# Patient Record
Sex: Female | Born: 1955 | Race: White | Hispanic: No | Marital: Married | Smoking: Never smoker
Health system: Southern US, Community
[De-identification: ages and names within clinical notes are randomized; demographics above are authoritative.]

## PROBLEM LIST (undated history)

## (undated) DIAGNOSIS — F32A Depression, unspecified: Secondary | ICD-10-CM

## (undated) DIAGNOSIS — N189 Chronic kidney disease, unspecified: Secondary | ICD-10-CM

## (undated) DIAGNOSIS — J4520 Mild intermittent asthma, uncomplicated: Secondary | ICD-10-CM

## (undated) DIAGNOSIS — R011 Cardiac murmur, unspecified: Secondary | ICD-10-CM

## (undated) DIAGNOSIS — N289 Disorder of kidney and ureter, unspecified: Secondary | ICD-10-CM

## (undated) DIAGNOSIS — I10 Essential (primary) hypertension: Secondary | ICD-10-CM

## (undated) DIAGNOSIS — F411 Generalized anxiety disorder: Secondary | ICD-10-CM

## (undated) DIAGNOSIS — E119 Type 2 diabetes mellitus without complications: Secondary | ICD-10-CM

## (undated) DIAGNOSIS — J45909 Unspecified asthma, uncomplicated: Secondary | ICD-10-CM

## (undated) DIAGNOSIS — K219 Gastro-esophageal reflux disease without esophagitis: Secondary | ICD-10-CM

## (undated) DIAGNOSIS — M1990 Unspecified osteoarthritis, unspecified site: Secondary | ICD-10-CM

## (undated) DIAGNOSIS — K449 Diaphragmatic hernia without obstruction or gangrene: Secondary | ICD-10-CM

## (undated) DIAGNOSIS — E782 Mixed hyperlipidemia: Secondary | ICD-10-CM

## (undated) DIAGNOSIS — E7800 Pure hypercholesterolemia, unspecified: Secondary | ICD-10-CM

## (undated) DIAGNOSIS — T8859XA Other complications of anesthesia, initial encounter: Secondary | ICD-10-CM

## (undated) DIAGNOSIS — Z973 Presence of spectacles and contact lenses: Secondary | ICD-10-CM

## (undated) DIAGNOSIS — G4733 Obstructive sleep apnea (adult) (pediatric): Secondary | ICD-10-CM

## (undated) DIAGNOSIS — Z8719 Personal history of other diseases of the digestive system: Secondary | ICD-10-CM

## (undated) DIAGNOSIS — D649 Anemia, unspecified: Secondary | ICD-10-CM

## (undated) DIAGNOSIS — Z8679 Personal history of other diseases of the circulatory system: Secondary | ICD-10-CM

## (undated) DIAGNOSIS — F329 Major depressive disorder, single episode, unspecified: Secondary | ICD-10-CM

## (undated) DIAGNOSIS — Z87442 Personal history of urinary calculi: Secondary | ICD-10-CM

## (undated) DIAGNOSIS — J309 Allergic rhinitis, unspecified: Secondary | ICD-10-CM

## (undated) DIAGNOSIS — T4145XA Adverse effect of unspecified anesthetic, initial encounter: Secondary | ICD-10-CM

## (undated) DIAGNOSIS — F419 Anxiety disorder, unspecified: Secondary | ICD-10-CM

## (undated) DIAGNOSIS — D509 Iron deficiency anemia, unspecified: Secondary | ICD-10-CM

## (undated) DIAGNOSIS — N200 Calculus of kidney: Secondary | ICD-10-CM

## (undated) DIAGNOSIS — G4730 Sleep apnea, unspecified: Secondary | ICD-10-CM

## (undated) DIAGNOSIS — T7840XA Allergy, unspecified, initial encounter: Secondary | ICD-10-CM

## (undated) DIAGNOSIS — H269 Unspecified cataract: Secondary | ICD-10-CM

## (undated) HISTORY — PX: EYE SURGERY: SHX253

## (undated) HISTORY — PX: OTHER SURGICAL HISTORY: SHX169

## (undated) HISTORY — DX: Calculus of kidney: N20.0

## (undated) HISTORY — DX: Allergy, unspecified, initial encounter: T78.40XA

## (undated) HISTORY — PX: ABDOMINAL EXPLORATION SURGERY: SHX538

## (undated) HISTORY — DX: Chronic kidney disease, unspecified: N18.9

## (undated) HISTORY — PX: SCLERAL BUCKLE PROCEDURE: SHX2734

## (undated) HISTORY — PX: BREAST EXCISIONAL BIOPSY: SUR124

## (undated) HISTORY — PX: WISDOM TOOTH EXTRACTION: SHX21

## (undated) HISTORY — PX: DIAGNOSTIC LAPAROSCOPY: SUR761

## (undated) HISTORY — PX: APPENDECTOMY: SHX54

## (undated) HISTORY — PX: DILATION AND CURETTAGE OF UTERUS: SHX78

## (undated) HISTORY — PX: LIPOMA EXCISION: SHX5283

## (undated) HISTORY — DX: Unspecified cataract: H26.9

## (undated) HISTORY — PX: RETINAL DETACHMENT SURGERY: SHX105

## (undated) SURGERY — Surgical Case
Anesthesia: *Unknown

---

## 1997-07-01 ENCOUNTER — Ambulatory Visit (HOSPITAL_COMMUNITY): Admission: RE | Admit: 1997-07-01 | Discharge: 1997-07-01 | Payer: Self-pay | Admitting: Obstetrics and Gynecology

## 1998-06-26 ENCOUNTER — Ambulatory Visit (HOSPITAL_COMMUNITY): Admission: RE | Admit: 1998-06-26 | Discharge: 1998-06-26 | Payer: Self-pay

## 1999-06-29 ENCOUNTER — Encounter: Payer: Self-pay | Admitting: Family Medicine

## 1999-06-29 ENCOUNTER — Ambulatory Visit (HOSPITAL_COMMUNITY): Admission: RE | Admit: 1999-06-29 | Discharge: 1999-06-29 | Payer: Self-pay | Admitting: Family Medicine

## 2000-07-29 ENCOUNTER — Ambulatory Visit (HOSPITAL_COMMUNITY): Admission: RE | Admit: 2000-07-29 | Discharge: 2000-07-29 | Payer: Self-pay | Admitting: Family Medicine

## 2000-07-29 ENCOUNTER — Encounter: Payer: Self-pay | Admitting: Family Medicine

## 2001-04-08 HISTORY — PX: LIPOMA EXCISION: SHX5283

## 2001-08-12 ENCOUNTER — Encounter: Payer: Self-pay | Admitting: Family Medicine

## 2001-08-12 ENCOUNTER — Ambulatory Visit (HOSPITAL_COMMUNITY): Admission: RE | Admit: 2001-08-12 | Discharge: 2001-08-12 | Payer: Self-pay | Admitting: Family Medicine

## 2001-08-21 ENCOUNTER — Encounter: Payer: Self-pay | Admitting: Family Medicine

## 2001-08-21 ENCOUNTER — Encounter: Admission: RE | Admit: 2001-08-21 | Discharge: 2001-08-21 | Payer: Self-pay | Admitting: Family Medicine

## 2002-02-23 ENCOUNTER — Encounter: Payer: Self-pay | Admitting: Family Medicine

## 2002-02-23 ENCOUNTER — Encounter: Admission: RE | Admit: 2002-02-23 | Discharge: 2002-02-23 | Payer: Self-pay | Admitting: Family Medicine

## 2002-08-27 ENCOUNTER — Encounter: Admission: RE | Admit: 2002-08-27 | Discharge: 2002-08-27 | Payer: Self-pay | Admitting: Family Medicine

## 2002-08-27 ENCOUNTER — Encounter: Payer: Self-pay | Admitting: Family Medicine

## 2003-09-15 ENCOUNTER — Encounter: Admission: RE | Admit: 2003-09-15 | Discharge: 2003-09-15 | Payer: Self-pay | Admitting: Family Medicine

## 2004-10-11 ENCOUNTER — Encounter: Admission: RE | Admit: 2004-10-11 | Discharge: 2004-10-11 | Payer: Self-pay | Admitting: Family Medicine

## 2005-11-06 ENCOUNTER — Encounter: Admission: RE | Admit: 2005-11-06 | Discharge: 2005-11-06 | Payer: Self-pay | Admitting: Family Medicine

## 2006-04-08 DIAGNOSIS — E119 Type 2 diabetes mellitus without complications: Secondary | ICD-10-CM

## 2006-04-08 HISTORY — PX: LAPAROSCOPIC APPENDECTOMY: SHX408

## 2006-04-08 HISTORY — DX: Type 2 diabetes mellitus without complications: E11.9

## 2006-11-13 ENCOUNTER — Encounter: Admission: RE | Admit: 2006-11-13 | Discharge: 2006-11-13 | Payer: Self-pay | Admitting: Family Medicine

## 2007-04-09 DIAGNOSIS — E119 Type 2 diabetes mellitus without complications: Secondary | ICD-10-CM | POA: Insufficient documentation

## 2008-01-05 ENCOUNTER — Encounter: Admission: RE | Admit: 2008-01-05 | Discharge: 2008-01-05 | Payer: Self-pay | Admitting: Family Medicine

## 2009-01-31 ENCOUNTER — Encounter: Admission: RE | Admit: 2009-01-31 | Discharge: 2009-01-31 | Payer: Self-pay | Admitting: Family Medicine

## 2009-02-07 ENCOUNTER — Encounter: Admission: RE | Admit: 2009-02-07 | Discharge: 2009-02-07 | Payer: Self-pay | Admitting: Family Medicine

## 2010-02-22 ENCOUNTER — Encounter: Admission: RE | Admit: 2010-02-22 | Discharge: 2010-02-22 | Payer: Self-pay | Admitting: Family Medicine

## 2011-02-04 ENCOUNTER — Other Ambulatory Visit: Payer: Self-pay | Admitting: Family Medicine

## 2011-02-04 DIAGNOSIS — Z1231 Encounter for screening mammogram for malignant neoplasm of breast: Secondary | ICD-10-CM

## 2011-03-01 ENCOUNTER — Ambulatory Visit
Admission: RE | Admit: 2011-03-01 | Discharge: 2011-03-01 | Disposition: A | Discharge status: Home / Self Care | Payer: BC Managed Care – PPO | Source: Ambulatory Visit | Attending: Family Medicine | Admitting: Family Medicine

## 2011-03-01 DIAGNOSIS — Z1231 Encounter for screening mammogram for malignant neoplasm of breast: Secondary | ICD-10-CM

## 2011-10-03 ENCOUNTER — Encounter (INDEPENDENT_AMBULATORY_CARE_PROVIDER_SITE_OTHER): Payer: BC Managed Care – PPO | Admitting: Ophthalmology

## 2011-10-03 DIAGNOSIS — H43819 Vitreous degeneration, unspecified eye: Secondary | ICD-10-CM

## 2011-10-03 DIAGNOSIS — H2510 Age-related nuclear cataract, unspecified eye: Secondary | ICD-10-CM

## 2011-10-03 DIAGNOSIS — H33309 Unspecified retinal break, unspecified eye: Secondary | ICD-10-CM

## 2011-10-03 DIAGNOSIS — H4310 Vitreous hemorrhage, unspecified eye: Secondary | ICD-10-CM

## 2011-10-03 DIAGNOSIS — H35419 Lattice degeneration of retina, unspecified eye: Secondary | ICD-10-CM

## 2011-10-07 ENCOUNTER — Encounter (INDEPENDENT_AMBULATORY_CARE_PROVIDER_SITE_OTHER): Payer: BC Managed Care – PPO | Admitting: Ophthalmology

## 2011-10-07 DIAGNOSIS — H33309 Unspecified retinal break, unspecified eye: Secondary | ICD-10-CM

## 2011-10-11 ENCOUNTER — Encounter (HOSPITAL_COMMUNITY): Payer: Self-pay | Admitting: Pharmacy Technician

## 2011-10-14 ENCOUNTER — Encounter (HOSPITAL_COMMUNITY): Payer: Self-pay | Admitting: *Deleted

## 2011-10-15 ENCOUNTER — Ambulatory Visit (HOSPITAL_COMMUNITY): Payer: BC Managed Care – PPO

## 2011-10-15 ENCOUNTER — Encounter (HOSPITAL_COMMUNITY): Payer: Self-pay

## 2011-10-15 ENCOUNTER — Ambulatory Visit (INDEPENDENT_AMBULATORY_CARE_PROVIDER_SITE_OTHER): Payer: BC Managed Care – PPO | Admitting: Ophthalmology

## 2011-10-15 ENCOUNTER — Encounter (HOSPITAL_COMMUNITY): Payer: Self-pay | Admitting: *Deleted

## 2011-10-15 ENCOUNTER — Encounter (HOSPITAL_COMMUNITY)
Admission: RE | Disposition: A | Discharge status: Home / Self Care | Payer: Self-pay | Source: Ambulatory Visit | Attending: Ophthalmology

## 2011-10-15 ENCOUNTER — Ambulatory Visit (HOSPITAL_COMMUNITY)
Admission: RE | Admit: 2011-10-15 | Discharge: 2011-10-16 | Disposition: A | Discharge status: Home / Self Care | Payer: BC Managed Care – PPO | Source: Ambulatory Visit | Attending: Ophthalmology | Admitting: Ophthalmology

## 2011-10-15 DIAGNOSIS — H4310 Vitreous hemorrhage, unspecified eye: Secondary | ICD-10-CM

## 2011-10-15 DIAGNOSIS — H33009 Unspecified retinal detachment with retinal break, unspecified eye: Secondary | ICD-10-CM

## 2011-10-15 DIAGNOSIS — E109 Type 1 diabetes mellitus without complications: Secondary | ICD-10-CM | POA: Insufficient documentation

## 2011-10-15 DIAGNOSIS — G4730 Sleep apnea, unspecified: Secondary | ICD-10-CM | POA: Insufficient documentation

## 2011-10-15 DIAGNOSIS — I10 Essential (primary) hypertension: Secondary | ICD-10-CM | POA: Insufficient documentation

## 2011-10-15 DIAGNOSIS — K219 Gastro-esophageal reflux disease without esophagitis: Secondary | ICD-10-CM | POA: Insufficient documentation

## 2011-10-15 HISTORY — DX: Essential (primary) hypertension: I10

## 2011-10-15 HISTORY — DX: Pure hypercholesterolemia, unspecified: E78.00

## 2011-10-15 HISTORY — PX: SCLERAL BUCKLE: SHX5340

## 2011-10-15 HISTORY — DX: Unspecified osteoarthritis, unspecified site: M19.90

## 2011-10-15 HISTORY — DX: Gastro-esophageal reflux disease without esophagitis: K21.9

## 2011-10-15 HISTORY — DX: Adverse effect of unspecified anesthetic, initial encounter: T41.45XA

## 2011-10-15 HISTORY — DX: Other complications of anesthesia, initial encounter: T88.59XA

## 2011-10-15 HISTORY — DX: Sleep apnea, unspecified: G47.30

## 2011-10-15 HISTORY — DX: Anemia, unspecified: D64.9

## 2011-10-15 LAB — BASIC METABOLIC PANEL
BUN: 18 mg/dL (ref 6–23)
CO2: 24 mEq/L (ref 19–32)
Chloride: 106 mEq/L (ref 96–112)
Creatinine, Ser: 0.87 mg/dL (ref 0.50–1.10)
GFR calc Af Amer: 84 mL/min — ABNORMAL LOW (ref >90)
Glucose, Bld: 110 mg/dL — ABNORMAL HIGH (ref 70–99)
Potassium: 3.9 mEq/L (ref 3.5–5.1)

## 2011-10-15 LAB — CBC
HCT: 35.9 % — ABNORMAL LOW (ref 36.0–46.0)
Hemoglobin: 12.2 g/dL (ref 12.0–15.0)
MCH: 30.4 pg (ref 26.0–34.0)
MCHC: 34 g/dL (ref 30.0–36.0)
MCV: 89.5 fL (ref 78.0–100.0)
RDW: 13 % (ref 11.5–15.5)

## 2011-10-15 LAB — SURGICAL PCR SCREEN: Staphylococcus aureus: NEGATIVE

## 2011-10-15 LAB — GLUCOSE, CAPILLARY
Glucose-Capillary: 161 mg/dL — ABNORMAL HIGH (ref 70–99)
Glucose-Capillary: 205 mg/dL — ABNORMAL HIGH (ref 70–99)

## 2011-10-15 SURGERY — SCLERAL BUCKLE
Anesthesia: General | Site: Eye | Laterality: Right | Wound class: Clean

## 2011-10-15 MED ORDER — BACITRACIN-POLYMYXIN B 500-10000 UNIT/GM OP OINT
1.0000 "application " | TOPICAL_OINTMENT | Freq: Four times a day (QID) | OPHTHALMIC | Status: DC
  Filled 2011-10-15: qty 3.5

## 2011-10-15 MED ORDER — TETRACAINE HCL 0.5 % OP SOLN
2.0000 [drp] | Freq: Once | OPHTHALMIC | Status: DC
  Filled 2011-10-15: qty 2

## 2011-10-15 MED ORDER — CLINDAMYCIN PHOSPHATE 600 MG/50ML IV SOLN
600.0000 mg | INTRAVENOUS | Status: DC
  Filled 2011-10-15: qty 50

## 2011-10-15 MED ORDER — MORPHINE SULFATE 2 MG/ML IJ SOLN: 1.0000 mg | INTRAMUSCULAR | Status: DC | PRN

## 2011-10-15 MED ORDER — BRIMONIDINE TARTRATE 0.2 % OP SOLN
1.0000 [drp] | Freq: Two times a day (BID) | OPHTHALMIC | Status: DC
  Filled 2011-10-15 (×2): qty 5

## 2011-10-15 MED ORDER — CITALOPRAM HYDROBROMIDE 40 MG PO TABS
40.0000 mg | ORAL_TABLET | Freq: Every day | ORAL | Status: DC
  Administered 2011-10-15: 40 mg via ORAL
  Filled 2011-10-15 (×2): qty 1

## 2011-10-15 MED ORDER — TEMAZEPAM 15 MG PO CAPS: 15.0000 mg | ORAL_CAPSULE | Freq: Every evening | ORAL | Status: DC | PRN

## 2011-10-15 MED ORDER — LIRAGLUTIDE 18 MG/3ML ~~LOC~~ SOLN: 1.8000 mg | Freq: Every day | SUBCUTANEOUS | Status: DC

## 2011-10-15 MED ORDER — LATANOPROST 0.005 % OP SOLN
1.0000 [drp] | Freq: Every day | OPHTHALMIC | Status: DC
  Filled 2011-10-15 (×2): qty 2.5

## 2011-10-15 MED ORDER — POLYETHYLENE GLYCOL 3350 17 G PO PACK
17.0000 g | PACK | Freq: Every day | ORAL | Status: DC
  Administered 2011-10-15: 17 g via ORAL
  Filled 2011-10-15 (×2): qty 1

## 2011-10-15 MED ORDER — SODIUM CHLORIDE 0.9 % IV SOLN
INTRAVENOUS | Status: DC | PRN
  Administered 2011-10-15 (×2): via INTRAVENOUS

## 2011-10-15 MED ORDER — ADULT MULTIVITAMIN W/MINERALS CH
1.0000 | ORAL_TABLET | Freq: Every day | ORAL | Status: DC
  Administered 2011-10-15: 1 via ORAL
  Filled 2011-10-15 (×2): qty 1

## 2011-10-15 MED ORDER — GATIFLOXACIN 0.5 % OP SOLN
1.0000 [drp] | OPHTHALMIC | Status: AC | PRN
  Administered 2011-10-15 (×3): 1 [drp] via OPHTHALMIC

## 2011-10-15 MED ORDER — ATROPINE SULFATE 1 % OP SOLN
OPHTHALMIC | Status: DC | PRN
  Administered 2011-10-15: 1 [drp] via OPHTHALMIC

## 2011-10-15 MED ORDER — PHENYLEPHRINE HCL 2.5 % OP SOLN
1.0000 [drp] | OPHTHALMIC | Status: AC | PRN
  Administered 2011-10-15 (×3): 1 [drp] via OPHTHALMIC

## 2011-10-15 MED ORDER — ONDANSETRON HCL 4 MG/2ML IJ SOLN: 4.0000 mg | Freq: Once | INTRAMUSCULAR | Status: DC | PRN

## 2011-10-15 MED ORDER — GATIFLOXACIN 0.5 % OP SOLN
1.0000 [drp] | Freq: Four times a day (QID) | OPHTHALMIC | Status: DC
  Filled 2011-10-15: qty 2.5

## 2011-10-15 MED ORDER — STERILE WATER FOR IRRIGATION IR SOLN
Status: DC | PRN
  Administered 2011-10-15: 1

## 2011-10-15 MED ORDER — MUPIROCIN 2 % EX OINT
TOPICAL_OINTMENT | Freq: Once | CUTANEOUS | Status: AC
  Administered 2011-10-15: 1 via NASAL

## 2011-10-15 MED ORDER — GLYCOPYRROLATE 0.2 MG/ML IJ SOLN
INTRAMUSCULAR | Status: DC | PRN
  Administered 2011-10-15: 0.6 mg via INTRAVENOUS
  Administered 2011-10-15: 0.2 mg via INTRAVENOUS

## 2011-10-15 MED ORDER — MUPIROCIN 2 % EX OINT
TOPICAL_OINTMENT | CUTANEOUS | Status: AC
  Administered 2011-10-15: 1 via NASAL
  Filled 2011-10-15: qty 22

## 2011-10-15 MED ORDER — BUPIVACAINE HCL 0.75 % IJ SOLN
INTRAMUSCULAR | Status: DC | PRN
  Administered 2011-10-15: 10 mL

## 2011-10-15 MED ORDER — CYCLOPENTOLATE HCL 1 % OP SOLN
1.0000 [drp] | OPHTHALMIC | Status: AC | PRN
  Administered 2011-10-15 (×3): 1 [drp] via OPHTHALMIC

## 2011-10-15 MED ORDER — HYDROMORPHONE HCL PF 1 MG/ML IJ SOLN
INTRAMUSCULAR | Status: AC
  Filled 2011-10-15: qty 1

## 2011-10-15 MED ORDER — LISINOPRIL 10 MG PO TABS
10.0000 mg | ORAL_TABLET | Freq: Every day | ORAL | Status: DC
  Filled 2011-10-15 (×2): qty 1

## 2011-10-15 MED ORDER — ONDANSETRON HCL 4 MG/2ML IJ SOLN
4.0000 mg | Freq: Four times a day (QID) | INTRAMUSCULAR | Status: DC | PRN
  Administered 2011-10-15: 4 mg via INTRAVENOUS
  Filled 2011-10-15: qty 2

## 2011-10-15 MED ORDER — PREDNISOLONE ACETATE 1 % OP SUSP
1.0000 [drp] | Freq: Four times a day (QID) | OPHTHALMIC | Status: DC
  Filled 2011-10-15 (×2): qty 1

## 2011-10-15 MED ORDER — HYDROCODONE-ACETAMINOPHEN 5-325 MG PO TABS: 1.0000 | ORAL_TABLET | ORAL | Status: DC | PRN

## 2011-10-15 MED ORDER — FENTANYL CITRATE 0.05 MG/ML IJ SOLN
INTRAMUSCULAR | Status: DC | PRN
  Administered 2011-10-15: 50 ug via INTRAVENOUS
  Administered 2011-10-15: 25 ug via INTRAVENOUS
  Administered 2011-10-15: 100 ug via INTRAVENOUS
  Administered 2011-10-15: 50 ug via INTRAVENOUS

## 2011-10-15 MED ORDER — SODIUM CHLORIDE 0.9 % IV SOLN
INTRAVENOUS | Status: DC
  Administered 2011-10-15: 13:00:00 via INTRAVENOUS

## 2011-10-15 MED ORDER — PROPOFOL 10 MG/ML IV EMUL
INTRAVENOUS | Status: DC | PRN
  Administered 2011-10-15: 200 mg via INTRAVENOUS

## 2011-10-15 MED ORDER — ACETAZOLAMIDE SODIUM 500 MG IJ SOLR
500.0000 mg | Freq: Once | INTRAMUSCULAR | Status: AC
  Administered 2011-10-16: 500 mg via INTRAVENOUS
  Filled 2011-10-15: qty 500

## 2011-10-15 MED ORDER — TOPIRAMATE 25 MG PO CPSP: 25.0000 mg | ORAL_CAPSULE | Freq: Two times a day (BID) | ORAL | Status: DC

## 2011-10-15 MED ORDER — HYDROMORPHONE HCL PF 1 MG/ML IJ SOLN
0.2500 mg | INTRAMUSCULAR | Status: DC | PRN
  Administered 2011-10-15: 0.5 mg via INTRAVENOUS
  Administered 2011-10-15: 0.25 mg via INTRAVENOUS

## 2011-10-15 MED ORDER — TROPICAMIDE 1 % OP SOLN
1.0000 [drp] | OPHTHALMIC | Status: AC | PRN
  Administered 2011-10-15 (×3): 1 [drp] via OPHTHALMIC

## 2011-10-15 MED ORDER — ONDANSETRON HCL 4 MG/2ML IJ SOLN
INTRAMUSCULAR | Status: DC | PRN
  Administered 2011-10-15: 4 mg via INTRAVENOUS

## 2011-10-15 MED ORDER — SODIUM CHLORIDE 0.45 % IV SOLN: INTRAVENOUS | Status: DC

## 2011-10-15 MED ORDER — SODIUM CHLORIDE 0.9 % IR SOLN
Status: DC | PRN
  Administered 2011-10-15: 1

## 2011-10-15 MED ORDER — DEXAMETHASONE SODIUM PHOSPHATE 10 MG/ML IJ SOLN
INTRAMUSCULAR | Status: DC | PRN
  Administered 2011-10-15: 10 mg

## 2011-10-15 MED ORDER — VITAMIN D3 25 MCG (1000 UNIT) PO TABS
1000.0000 [IU] | ORAL_TABLET | Freq: Every day | ORAL | Status: DC
  Administered 2011-10-15: 1000 [IU] via ORAL
  Filled 2011-10-15 (×2): qty 1

## 2011-10-15 MED ORDER — FENOFIBRATE 160 MG PO TABS
160.0000 mg | ORAL_TABLET | Freq: Every day | ORAL | Status: DC
  Administered 2011-10-15: 160 mg via ORAL
  Filled 2011-10-15 (×3): qty 1

## 2011-10-15 MED ORDER — TOPIRAMATE 25 MG PO TABS
25.0000 mg | ORAL_TABLET | Freq: Two times a day (BID) | ORAL | Status: DC
  Administered 2011-10-15: 25 mg via ORAL
  Filled 2011-10-15 (×4): qty 1

## 2011-10-15 MED ORDER — ACETAMINOPHEN 325 MG PO TABS
325.0000 mg | ORAL_TABLET | ORAL | Status: DC | PRN
  Administered 2011-10-15 – 2011-10-16 (×3): 650 mg via ORAL
  Filled 2011-10-15 (×3): qty 2

## 2011-10-15 MED ORDER — ONDANSETRON HCL 4 MG/2ML IJ SOLN
INTRAMUSCULAR | Status: AC
  Filled 2011-10-15: qty 2

## 2011-10-15 MED ORDER — OMEGA-3-ACID ETHYL ESTERS 1 G PO CAPS
2.0000 g | ORAL_CAPSULE | Freq: Two times a day (BID) | ORAL | Status: DC
  Administered 2011-10-15: 2 g via ORAL
  Filled 2011-10-15 (×4): qty 2

## 2011-10-15 MED ORDER — SODIUM CHLORIDE 0.9 % IJ SOLN
INTRAMUSCULAR | Status: DC | PRN
  Administered 2011-10-15: 13:00:00

## 2011-10-15 MED ORDER — MAGNESIUM HYDROXIDE 400 MG/5ML PO SUSP: 15.0000 mL | Freq: Four times a day (QID) | ORAL | Status: DC | PRN

## 2011-10-15 MED ORDER — NEOSTIGMINE METHYLSULFATE 1 MG/ML IJ SOLN
INTRAMUSCULAR | Status: DC | PRN
  Administered 2011-10-15: 5 mg via INTRAVENOUS

## 2011-10-15 MED ORDER — MIDAZOLAM HCL 5 MG/5ML IJ SOLN
INTRAMUSCULAR | Status: DC | PRN
  Administered 2011-10-15: 2 mg via INTRAVENOUS

## 2011-10-15 MED ORDER — ATORVASTATIN CALCIUM 40 MG PO TABS
40.0000 mg | ORAL_TABLET | Freq: Every day | ORAL | Status: DC
  Administered 2011-10-15: 40 mg via ORAL
  Filled 2011-10-15 (×2): qty 1

## 2011-10-15 MED ORDER — BSS IO SOLN
INTRAOCULAR | Status: DC | PRN
  Administered 2011-10-15: 15 mL via INTRAOCULAR

## 2011-10-15 MED ORDER — ONDANSETRON HCL 4 MG/2ML IJ SOLN
4.0000 mg | Freq: Once | INTRAMUSCULAR | Status: AC | PRN
  Administered 2011-10-15: 4 mg via INTRAVENOUS

## 2011-10-15 MED ORDER — DOCUSATE SODIUM 100 MG PO CAPS
100.0000 mg | ORAL_CAPSULE | Freq: Two times a day (BID) | ORAL | Status: DC
  Administered 2011-10-15: 100 mg via ORAL
  Filled 2011-10-15: qty 1

## 2011-10-15 MED ORDER — BACITRACIN-POLYMYXIN B 500-10000 UNIT/GM OP OINT
TOPICAL_OINTMENT | OPHTHALMIC | Status: DC | PRN
  Administered 2011-10-15: 1 via OPHTHALMIC

## 2011-10-15 MED ORDER — ROCURONIUM BROMIDE 100 MG/10ML IV SOLN
INTRAVENOUS | Status: DC | PRN
  Administered 2011-10-15: 50 mg via INTRAVENOUS
  Administered 2011-10-15: 10 mg via INTRAVENOUS
  Administered 2011-10-15: 5 mg via INTRAVENOUS

## 2011-10-15 MED ORDER — CLINDAMYCIN PHOSPHATE 600 MG/50ML IV SOLN
INTRAVENOUS | Status: DC | PRN
  Administered 2011-10-15: 600 mg via INTRAVENOUS

## 2011-10-15 SURGICAL SUPPLY — 83 items
10-0 CSB-6 ETHILON SUTURE 9007 ×1 IMPLANT
APL SRG 3 HI ABS STRL LF PLS (MISCELLANEOUS) ×6
APPLICATOR DR MATTHEWS STRL (MISCELLANEOUS) ×12 IMPLANT
BALL CTTN LRG ABS STRL LF (GAUZE/BANDAGES/DRESSINGS) ×3
BAND SCLERAL BUCKLING TYPE 240 (Ophthalmic Related) ×1 IMPLANT
BLADE EYE CATARACT 19 1.4 BEAV (BLADE) ×1 IMPLANT
BLADE MVR KNIFE 19G (BLADE) IMPLANT
BLADE SURG 15 STRL LF DISP TIS (BLADE) IMPLANT
BLADE SURG 15 STRL SS (BLADE)
CANNULA ANT CHAM MAIN (OPHTHALMIC RELATED) IMPLANT
CANNULA DUAL BORE 23G (CANNULA) IMPLANT
CORDS BIPOLAR (ELECTRODE) IMPLANT
COTTONBALL LRG STERILE PKG (GAUZE/BANDAGES/DRESSINGS) ×6 IMPLANT
COVER SURGICAL LIGHT HANDLE (MISCELLANEOUS) ×3 IMPLANT
DRAPE INCISE 51X51 W/FILM STRL (DRAPES) ×1 IMPLANT
DRAPE OPHTHALMIC 77X100 STRL (CUSTOM PROCEDURE TRAY) ×2 IMPLANT
ERASER HMR WETFIELD 23G BP (MISCELLANEOUS) IMPLANT
FILTER BLUE MILLIPORE (MISCELLANEOUS) IMPLANT
FILTER STRAW FLUID ASPIR (MISCELLANEOUS) IMPLANT
GAS OPHTHALMIC (MISCELLANEOUS) IMPLANT
GLOVE BIOGEL PI IND STRL 7.0 (GLOVE) IMPLANT
GLOVE BIOGEL PI INDICATOR 7.0 (GLOVE) ×1
GLOVE SS BIOGEL STRL SZ 6.5 (GLOVE) ×2 IMPLANT
GLOVE SS BIOGEL STRL SZ 7 (GLOVE) ×1 IMPLANT
GLOVE SUPERSENSE BIOGEL SZ 6.5 (GLOVE)
GLOVE SUPERSENSE BIOGEL SZ 7 (GLOVE) ×3
GLOVE SURG 8.5 LATEX PF (GLOVE) ×2 IMPLANT
GLOVE SURG SS PI 6.5 STRL IVOR (GLOVE) ×2 IMPLANT
GLOVE SURG SS PI 7.0 STRL IVOR (GLOVE) ×4 IMPLANT
GLOVE SURG SS PI 8.5 STRL IVOR (GLOVE) ×1
GLOVE SURG SS PI 8.5 STRL STRW (GLOVE) IMPLANT
GOWN STRL NON-REIN LRG LVL3 (GOWN DISPOSABLE) ×9 IMPLANT
ILLUMINATOR CHOW PICK 25GA (MISCELLANEOUS) IMPLANT
KIT BASIN OR (CUSTOM PROCEDURE TRAY) ×2 IMPLANT
KIT PERFLUORON PROCEDURE 5ML (MISCELLANEOUS) IMPLANT
KIT ROOM TURNOVER OR (KITS) ×2 IMPLANT
KNIFE CRESCENT 1.75 EDGEAHEAD (BLADE) IMPLANT
KNIFE GRIESHABER SHARP 2.5MM (MISCELLANEOUS) ×6 IMPLANT
MARKER SKIN DUAL TIP RULER LAB (MISCELLANEOUS) IMPLANT
MASK EYE SHIELD (GAUZE/BANDAGES/DRESSINGS) ×1 IMPLANT
NDL 18GX1X1/2 (RX/OR ONLY) (NEEDLE) ×1 IMPLANT
NDL 25GX 5/8IN NON SAFETY (NEEDLE) IMPLANT
NDL HYPO 30X.5 LL (NEEDLE) ×2 IMPLANT
NEEDLE 18GX1X1/2 (RX/OR ONLY) (NEEDLE) ×2 IMPLANT
NEEDLE 25GX 5/8IN NON SAFETY (NEEDLE) IMPLANT
NEEDLE 27GAX1X1/2 (NEEDLE) IMPLANT
NEEDLE HYPO 30X.5 LL (NEEDLE) ×4 IMPLANT
NS IRRIG 1000ML POUR BTL (IV SOLUTION) ×2 IMPLANT
PACK VITRECTOMY CUSTOM (CUSTOM PROCEDURE TRAY) ×2 IMPLANT
PAD ARMBOARD 7.5X6 YLW CONV (MISCELLANEOUS) ×3 IMPLANT
PAD EYE OVAL STERILE LF (GAUZE/BANDAGES/DRESSINGS) ×1 IMPLANT
PAK VITRECTOMY PIK 25 GA (OPHTHALMIC RELATED) IMPLANT
PROBE DIRECTIONAL LASER (MISCELLANEOUS) IMPLANT
REPL STRA BRUSH NDL (NEEDLE) IMPLANT
REPL STRA BRUSH NEEDLE (NEEDLE) IMPLANT
RESERVOIR BACK FLUSH (MISCELLANEOUS) IMPLANT
ROLLS DENTAL (MISCELLANEOUS) ×4 IMPLANT
SET FLUID INJECTOR (SET/KITS/TRAYS/PACK) IMPLANT
SET VGFI TUBING 8065808002 (SET/KITS/TRAYS/PACK) IMPLANT
SLEEVE SCLERAL TYPE 270 (Ophthalmic Related) ×1 IMPLANT
SPEAR EYE SURG WECK-CEL (MISCELLANEOUS) ×6 IMPLANT
SPONGE GROOVED SILICONE 4X12X8 (Ophthalmic Related) ×1 IMPLANT
SPONGE SURGIFOAM ABS GEL 12-7 (HEMOSTASIS) ×1 IMPLANT
STOPCOCK 4 WAY LG BORE MALE ST (IV SETS) IMPLANT
SUT CHROMIC 7 0 TG140 8 (SUTURE) ×2 IMPLANT
SUT ETHILON 9 0 TG140 8 (SUTURE) IMPLANT
SUT MERSILENE 4 0 RV 2 (SUTURE) ×4 IMPLANT
SUT SILK 2 0 (SUTURE) ×2
SUT SILK 2-0 18XBRD TIE 12 (SUTURE) ×1 IMPLANT
SUT SILK 4 0 RB 1 (SUTURE) ×2 IMPLANT
SUT VICRYL 7 0 TG140 8 (SUTURE) IMPLANT
SYR 20CC LL (SYRINGE) ×2 IMPLANT
SYR 5ML LL (SYRINGE) IMPLANT
SYR BULB 3OZ (MISCELLANEOUS) ×2 IMPLANT
SYR TB 1ML LUER SLIP (SYRINGE) IMPLANT
SYRINGE 10CC LL (SYRINGE) IMPLANT
TAPE PAPER MEDFIX 1IN X 10YD (GAUZE/BANDAGES/DRESSINGS) ×2 IMPLANT
TIRE 11 SCLERAL TYPE 279 (Ophthalmic Related) ×1 IMPLANT
TOWEL OR 17X24 6PK STRL BLUE (TOWEL DISPOSABLE) ×6 IMPLANT
TUBING ART PRESS 12 MALE/MALE (MISCELLANEOUS) IMPLANT
VITREORETINAL VISCODISSEC (MISCELLANEOUS) IMPLANT
WATER STERILE IRR 1000ML POUR (IV SOLUTION) ×2 IMPLANT
WIPE INSTRUMENT VISIWIPE 73X73 (MISCELLANEOUS) ×2 IMPLANT

## 2011-10-15 NOTE — Transfer of Care (Signed)
Immediate Anesthesia Transfer of Care Note  Patient: Jade Perez  Procedure(s) Performed: Procedure(s) (LRB): SCLERAL BUCKLE (Right) DIODE LASER APPLICATION (Right) GAS/FLUID EXCHANGE (Right)  Patient Location: PACU  Anesthesia Type: General  Level of Consciousness: sedated  Airway & Oxygen Therapy: Patient Spontanous Breathing  Post-op Assessment: Report given to PACU RN  Post vital signs: stable  Complications: No apparent anesthesia complications

## 2011-10-15 NOTE — Brief Op Note (Signed)
Brief Operative note   Preoperative diagnosis:  Pre-Op Diagnosis Codes:    * Retinal detachment with retinal defect, unspecified [361.00] Postoperative diagnosis  Post-Op Diagnosis Codes:    * Retinal detachment with retinal defect, unspecified [361.00]  Procedures: Scleral buckle with laser  Surgeon:  Sherrie George, MD...  Assistant:  Rosalie Doctor SA  Anesthesia: General  Specimen: none  Estimated blood loss:  1cc  Complications: none  Patient sent to PACU in good condition  Composed by Sherrie George MD  Dictation number: [id number]

## 2011-10-15 NOTE — Anesthesia Preprocedure Evaluation (Addendum)
Anesthesia Evaluation  Patient identified by MRN, date of birth, ID band Patient awake    Reviewed: Allergy & Precautions, H&P , NPO status , Patient's Chart, lab work & pertinent test results, reviewed documented beta blocker date and time   History of Anesthesia Complications (+) PROLONGED EMERGENCE  Airway Mallampati: I TM Distance: <3 FB Neck ROM: Full    Dental  (+) Teeth Intact and Dental Advisory Given   Pulmonary sleep apnea and Continuous Positive Airway Pressure Ventilation ,  breath sounds clear to auscultation        Cardiovascular hypertension, Pt. on medications Rhythm:Regular Rate:Normal     Neuro/Psych    GI/Hepatic GERD-  Medicated and Controlled,  Endo/Other  Well Controlled, Type 1, Oral Hypoglycemic Agents  Renal/GU      Musculoskeletal   Abdominal (+)  Abdomen: soft. Bowel sounds: normal.  Peds  Hematology   Anesthesia Other Findings   Reproductive/Obstetrics                         Anesthesia Physical Anesthesia Plan  ASA: III  Anesthesia Plan: General   Post-op Pain Management:    Induction: Intravenous  Airway Management Planned: Oral ETT  Additional Equipment:   Intra-op Plan:   Post-operative Plan: Extubation in OR  Informed Consent: I have reviewed the patients History and Physical, chart, labs and discussed the procedure including the risks, benefits and alternatives for the proposed anesthesia with the patient or authorized representative who has indicated his/her understanding and acceptance.   Dental advisory given  Plan Discussed with: CRNA, Anesthesiologist and Surgeon  Anesthesia Plan Comments:         Anesthesia Quick Evaluation

## 2011-10-15 NOTE — Anesthesia Postprocedure Evaluation (Signed)
  Anesthesia Post-op Note  Patient: Jade Perez  Procedure(s) Performed: Procedure(s) (LRB): SCLERAL BUCKLE (Right) DIODE LASER APPLICATION (Right)  Patient Location: PACU  Anesthesia Type: General  Level of Consciousness: awake, alert , oriented and patient cooperative  Airway and Oxygen Therapy: Patient Spontanous Breathing and Patient connected to nasal cannula oxygen  Post-op Pain: mild  Post-op Assessment: Post-op Vital signs reviewed, Patient's Cardiovascular Status Stable, Respiratory Function Stable, Patent Airway, No signs of Nausea or vomiting and Pain level controlled  Post-op Vital Signs: stable  Complications: No apparent anesthesia complications

## 2011-10-15 NOTE — H&P (Signed)
I examined the patient today and there is no change in the medical status 

## 2011-10-16 ENCOUNTER — Encounter (HOSPITAL_COMMUNITY): Payer: Self-pay | Admitting: Ophthalmology

## 2011-10-16 MED ORDER — GATIFLOXACIN 0.5 % OP SOLN: 1.0000 [drp] | Freq: Four times a day (QID) | OPHTHALMIC | Status: DC

## 2011-10-16 MED ORDER — PREDNISOLONE ACETATE 1 % OP SUSP: 1.0000 [drp] | Freq: Four times a day (QID) | OPHTHALMIC | Status: AC

## 2011-10-16 MED ORDER — BACITRACIN-POLYMYXIN B 500-10000 UNIT/GM OP OINT: 1.0000 "application " | TOPICAL_OINTMENT | Freq: Four times a day (QID) | OPHTHALMIC | Status: AC

## 2011-10-16 NOTE — Op Note (Signed)
NAMEMarland Kitchen  Jade, RUSCITTI NO.:  [id number]  MEDICAL RECORD NO.:  [id number]  LOCATION:  [id number]                        FACILITY:  [facility]  PHYSICIAN:  Beulah Gandy. Ashley Royalty, M.D. DATE OF BIRTH:  28-Nov-1955  DATE OF PROCEDURE:  10/15/2011 DATE OF DISCHARGE:                              OPERATIVE REPORT   ADMISSION DIAGNOSIS:  Rhegmatogenous retinal detachment, right eye.  PROCEDURE:  Scleral buckle, right eye; retinal photocoagulation, right eye.  SURGEON:  Beulah Gandy. Ashley Royalty, MD  ASSISTANT:  Rosalie Doctor, SA  ANESTHESIA:  General.  DETAILS:  Usual prep and drape, 360 degree limbal peritomy, isolation of 4 rectus muscles on 2-0 silk, scleral dissection and thin sclera from 8 o'clock to 1:30 o'clock to admit #279 intrascleral implant with 2 mm trim from the posterior edge.  A 508G radial segment was fashioned to fit beneath the break at 11 o'clock.  Diathermy was placed in the bed. The 240 band was placed around the eye with a 270 sleeve at 5 o'clock. Belt loops were placed at 2 o'clock, 4 o'clock, and 6 o'clock.  A perforation site was chosen in the posterior aspect of the bed.  A moderate amount of clear colorless subretinal fluid which was gelatinous came forth through the perforation.  The 508G element was placed against the globe and the 279 implant was placed against the globe.  The ends were trimmed appropriately. The 240 band was tightened to cause a proper indentation of the globe.  Indirect ophthalmoscopy showed the retina to be lying nicely in place on the scleral buckle with the retinal break well supported.  The indirect ophthalmoscope laser was moved into place, 801 burns were placed on the scleral buckle around the retinal break, the power of 460 mW, 1000 microns each and 0.1 seconds each.  The buckle was adjusted and trimmed.  The band was adjusted and trimmed.  The conjunctivae were reposited with 7-0 chromic suture.  Polymyxin and gentamicin were  irrigated into tenon space.  Atropine solution was applied.  The conjunctivae were closed with 7-0 chromic.  Marcaine was injected around the globe for postop pain.  Paracentesis x1 obtained with closing pressure of 15 with a Baer care tonometer.  Polysporin and patch and shield were placed.  The patient was awakened and taken to recovery in satisfactory condition.  COMPLICATIONS:  None.  DURATION:  Two hours.     Beulah Gandy. Ashley Royalty, M.D.     JDM/MEDQ  D:  10/15/2011  T:  10/16/2011  Job:  [id number]

## 2011-10-16 NOTE — Discharge Summary (Signed)
Discharge summary not needed on [facility] patients per medical records. 

## 2011-10-16 NOTE — Progress Notes (Signed)
10/16/2011, 6:43 AM  Mental Status:  Awake, Alert, Oriented  Anterior segment: Cornea  Clear    Anterior Chamber Clear    Lens:   Cataract  Intra Ocular Pressure 18 mmHg with Tonopen  Vitreous: Clear   Retina:  Attached Good laser reaction   Impression: Excellent result Retina attached   Final Diagnosis: Principal Problem:  *Rhegmatogenous retinal detachment   Plan: start post operative eye drops.  Discharge to home.  Give post operative instructions  Sherrie George 10/16/2011, 6:43 AM

## 2011-10-16 NOTE — Progress Notes (Signed)
Pt A/O, no noted distress. Pt was educated on discharge instructions. Pt was able to verbal explain and demonstrate the instruction, education was effective. Pt transported by facility employees to main entrance to meet spouse. Pt will follow up with Dr. Ashley Royalty.

## 2011-10-23 ENCOUNTER — Inpatient Hospital Stay (INDEPENDENT_AMBULATORY_CARE_PROVIDER_SITE_OTHER): Payer: BC Managed Care – PPO | Admitting: Ophthalmology

## 2011-10-23 DIAGNOSIS — H33009 Unspecified retinal detachment with retinal break, unspecified eye: Secondary | ICD-10-CM

## 2011-11-11 ENCOUNTER — Encounter (INDEPENDENT_AMBULATORY_CARE_PROVIDER_SITE_OTHER): Payer: BC Managed Care – PPO | Admitting: Ophthalmology

## 2011-11-11 DIAGNOSIS — H33009 Unspecified retinal detachment with retinal break, unspecified eye: Secondary | ICD-10-CM

## 2011-12-26 ENCOUNTER — Encounter (INDEPENDENT_AMBULATORY_CARE_PROVIDER_SITE_OTHER): Payer: BC Managed Care – PPO | Admitting: Ophthalmology

## 2011-12-26 DIAGNOSIS — H33009 Unspecified retinal detachment with retinal break, unspecified eye: Secondary | ICD-10-CM

## 2011-12-26 DIAGNOSIS — H1130 Conjunctival hemorrhage, unspecified eye: Secondary | ICD-10-CM

## 2012-01-20 ENCOUNTER — Encounter (INDEPENDENT_AMBULATORY_CARE_PROVIDER_SITE_OTHER): Payer: BC Managed Care – PPO | Admitting: Ophthalmology

## 2012-01-20 DIAGNOSIS — H43819 Vitreous degeneration, unspecified eye: Secondary | ICD-10-CM

## 2012-01-20 DIAGNOSIS — H2510 Age-related nuclear cataract, unspecified eye: Secondary | ICD-10-CM

## 2012-01-20 DIAGNOSIS — H33009 Unspecified retinal detachment with retinal break, unspecified eye: Secondary | ICD-10-CM

## 2012-01-20 DIAGNOSIS — H35419 Lattice degeneration of retina, unspecified eye: Secondary | ICD-10-CM

## 2012-03-23 ENCOUNTER — Other Ambulatory Visit: Payer: Self-pay | Admitting: Family Medicine

## 2012-03-23 DIAGNOSIS — Z1231 Encounter for screening mammogram for malignant neoplasm of breast: Secondary | ICD-10-CM

## 2012-04-14 ENCOUNTER — Ambulatory Visit: Payer: BC Managed Care – PPO

## 2012-04-20 ENCOUNTER — Ambulatory Visit: Payer: BC Managed Care – PPO

## 2012-05-06 ENCOUNTER — Ambulatory Visit: Payer: BC Managed Care – PPO

## 2012-05-18 ENCOUNTER — Ambulatory Visit
Admission: RE | Admit: 2012-05-18 | Discharge: 2012-05-18 | Disposition: A | Discharge status: Home / Self Care | Payer: BC Managed Care – PPO | Source: Ambulatory Visit | Attending: Family Medicine | Admitting: Family Medicine

## 2012-05-18 DIAGNOSIS — Z1231 Encounter for screening mammogram for malignant neoplasm of breast: Secondary | ICD-10-CM

## 2012-06-04 HISTORY — PX: COLONOSCOPY: SHX174

## 2013-01-20 ENCOUNTER — Ambulatory Visit (INDEPENDENT_AMBULATORY_CARE_PROVIDER_SITE_OTHER): Payer: BC Managed Care – PPO | Admitting: Ophthalmology

## 2013-01-28 ENCOUNTER — Ambulatory Visit (INDEPENDENT_AMBULATORY_CARE_PROVIDER_SITE_OTHER): Payer: BC Managed Care – PPO | Admitting: Ophthalmology

## 2013-01-28 DIAGNOSIS — E11319 Type 2 diabetes mellitus with unspecified diabetic retinopathy without macular edema: Secondary | ICD-10-CM

## 2013-01-28 DIAGNOSIS — H35039 Hypertensive retinopathy, unspecified eye: Secondary | ICD-10-CM

## 2013-01-28 DIAGNOSIS — I10 Essential (primary) hypertension: Secondary | ICD-10-CM

## 2013-01-28 DIAGNOSIS — E1139 Type 2 diabetes mellitus with other diabetic ophthalmic complication: Secondary | ICD-10-CM

## 2013-01-28 DIAGNOSIS — H33009 Unspecified retinal detachment with retinal break, unspecified eye: Secondary | ICD-10-CM

## 2013-01-28 DIAGNOSIS — H43819 Vitreous degeneration, unspecified eye: Secondary | ICD-10-CM

## 2013-01-28 DIAGNOSIS — H2510 Age-related nuclear cataract, unspecified eye: Secondary | ICD-10-CM

## 2013-05-27 ENCOUNTER — Other Ambulatory Visit: Payer: Self-pay

## 2013-05-27 DIAGNOSIS — Z1231 Encounter for screening mammogram for malignant neoplasm of breast: Secondary | ICD-10-CM

## 2013-06-11 ENCOUNTER — Ambulatory Visit
Admission: RE | Admit: 2013-06-11 | Discharge: 2013-06-11 | Disposition: A | Discharge status: Home / Self Care | Payer: Self-pay | Source: Ambulatory Visit

## 2013-06-11 DIAGNOSIS — Z1231 Encounter for screening mammogram for malignant neoplasm of breast: Secondary | ICD-10-CM

## 2014-02-23 ENCOUNTER — Ambulatory Visit (INDEPENDENT_AMBULATORY_CARE_PROVIDER_SITE_OTHER): Payer: BC Managed Care – PPO | Admitting: Ophthalmology

## 2014-02-23 DIAGNOSIS — H35033 Hypertensive retinopathy, bilateral: Secondary | ICD-10-CM

## 2014-02-23 DIAGNOSIS — H35412 Lattice degeneration of retina, left eye: Secondary | ICD-10-CM

## 2014-02-23 DIAGNOSIS — H43822 Vitreomacular adhesion, left eye: Secondary | ICD-10-CM

## 2014-02-23 DIAGNOSIS — E11319 Type 2 diabetes mellitus with unspecified diabetic retinopathy without macular edema: Secondary | ICD-10-CM

## 2014-02-23 DIAGNOSIS — I10 Essential (primary) hypertension: Secondary | ICD-10-CM

## 2014-02-23 DIAGNOSIS — E11329 Type 2 diabetes mellitus with mild nonproliferative diabetic retinopathy without macular edema: Secondary | ICD-10-CM

## 2014-02-23 DIAGNOSIS — H33302 Unspecified retinal break, left eye: Secondary | ICD-10-CM

## 2014-03-14 ENCOUNTER — Ambulatory Visit (INDEPENDENT_AMBULATORY_CARE_PROVIDER_SITE_OTHER): Payer: BC Managed Care – PPO | Admitting: Ophthalmology

## 2014-03-14 DIAGNOSIS — H33302 Unspecified retinal break, left eye: Secondary | ICD-10-CM

## 2014-05-25 HISTORY — PX: UPPER GASTROINTESTINAL ENDOSCOPY: SHX188

## 2014-06-06 ENCOUNTER — Other Ambulatory Visit: Payer: Self-pay

## 2014-06-06 DIAGNOSIS — Z1231 Encounter for screening mammogram for malignant neoplasm of breast: Secondary | ICD-10-CM

## 2014-07-14 ENCOUNTER — Ambulatory Visit
Admission: RE | Admit: 2014-07-14 | Discharge: 2014-07-14 | Disposition: A | Discharge status: Home / Self Care | Payer: BC Managed Care – PPO | Source: Ambulatory Visit

## 2014-07-14 ENCOUNTER — Ambulatory Visit (INDEPENDENT_AMBULATORY_CARE_PROVIDER_SITE_OTHER): Payer: BC Managed Care – PPO | Admitting: Ophthalmology

## 2014-07-14 DIAGNOSIS — Z1231 Encounter for screening mammogram for malignant neoplasm of breast: Secondary | ICD-10-CM

## 2014-07-14 DIAGNOSIS — I10 Essential (primary) hypertension: Secondary | ICD-10-CM | POA: Diagnosis not present

## 2014-07-14 DIAGNOSIS — E11319 Type 2 diabetes mellitus with unspecified diabetic retinopathy without macular edema: Secondary | ICD-10-CM

## 2014-07-14 DIAGNOSIS — H35412 Lattice degeneration of retina, left eye: Secondary | ICD-10-CM | POA: Diagnosis not present

## 2014-07-14 DIAGNOSIS — E11329 Type 2 diabetes mellitus with mild nonproliferative diabetic retinopathy without macular edema: Secondary | ICD-10-CM

## 2014-07-14 DIAGNOSIS — H35033 Hypertensive retinopathy, bilateral: Secondary | ICD-10-CM | POA: Diagnosis not present

## 2014-07-14 DIAGNOSIS — H43812 Vitreous degeneration, left eye: Secondary | ICD-10-CM | POA: Diagnosis not present

## 2014-11-29 DIAGNOSIS — H1010 Acute atopic conjunctivitis, unspecified eye: Secondary | ICD-10-CM | POA: Insufficient documentation

## 2014-11-29 DIAGNOSIS — J45909 Unspecified asthma, uncomplicated: Secondary | ICD-10-CM | POA: Insufficient documentation

## 2014-11-29 DIAGNOSIS — K219 Gastro-esophageal reflux disease without esophagitis: Secondary | ICD-10-CM | POA: Insufficient documentation

## 2014-11-29 DIAGNOSIS — J309 Allergic rhinitis, unspecified: Principal | ICD-10-CM

## 2015-01-05 ENCOUNTER — Encounter: Payer: Self-pay | Admitting: Allergy and Immunology

## 2015-01-05 ENCOUNTER — Ambulatory Visit (INDEPENDENT_AMBULATORY_CARE_PROVIDER_SITE_OTHER): Payer: BC Managed Care – PPO | Admitting: Allergy and Immunology

## 2015-01-05 VITALS — BP 126/62 | HR 80 | Resp 12 | Ht 62.0 in | Wt 191.8 lb

## 2015-01-05 DIAGNOSIS — J387 Other diseases of larynx: Secondary | ICD-10-CM

## 2015-01-05 DIAGNOSIS — K219 Gastro-esophageal reflux disease without esophagitis: Secondary | ICD-10-CM

## 2015-01-05 MED ORDER — MONTELUKAST SODIUM 10 MG PO TABS: 10.0000 mg | ORAL_TABLET | Freq: Every day | ORAL | Status: DC

## 2015-01-05 NOTE — Assessment & Plan Note (Signed)
  1. Continue omeprazole  in AM and Ranitidine  PM 2. Continue tums use when drinking caffeine 3. Continue nasonex and montelukast. 4. Get a flu vaccine 7. Return in 6 months

## 2015-01-06 NOTE — Progress Notes (Signed)
Subjective  Jade Perez is a 59 y.o. female who [facility] of the following:  Problem  Lprd (Laryngopharyngeal Reflux Disease)   Has been doing well with her current medical therapy. However she contracted a cold 2 weeks ago which is slowly improving and has left her with some throat clearing and cough without any anosmia or ugly nasal discharge. No wheezing, sob, or flem. No need to use a SABA. While using her omeprazole and ranitidine she stills needs to use tums if she drinks caffeine. Otherwise her GERD and LPR is well controlled. As well she has not had any problems with her nose while using nasonex and montelukast.      Past Medical History  Diagnosis Date  . Complication of anesthesia     difficulty waking up  . Hypertension   . Diabetes mellitus   . High cholesterol   . Sleep apnea     sleep study in [place], has but does not use CPAP, Dr. Yetta Flock has results [facility]  . GERD (gastroesophageal reflux disease)   . Arthritis   . Anemia   . Kidney stone  SEPT. 2016    Past Surgical History  Procedure Laterality Date  . Appendectomy    . Lipoma excision    . Abdominal exploration surgery    . Scleral buckle  10/15/2011    Procedure: SCLERAL BUCKLE;  Surgeon: Sherrie George, MD;  Location: [facility];  Service: Ophthalmology;  Laterality: Right;    Current Outpatient Prescriptions on File Prior to Visit  Medication Sig Dispense Refill  . atorvastatin (LIPITOR) 40 MG tablet Take 40 mg by mouth daily.    Marland Kitchen b complex vitamins tablet Take 1 tablet by mouth daily.    . cetirizine (ZYRTEC) 10 MG tablet Take 10 mg by mouth daily.    . citalopram (CELEXA) 40 MG tablet Take 40 mg by mouth daily.    . fenofibrate 160 MG tablet Take 160 mg by mouth at bedtime.    . Liraglutide (VICTOZA) 18 MG/3ML SOLN Inject 1.8 mg into the skin daily.     Marland Kitchen lisinopril (PRINIVIL,ZESTRIL) 10 MG tablet Take 10 mg by mouth daily.    .  mometasone (NASONEX) 50 MCG/ACT nasal spray Place 2 sprays into the nose daily.    . Multiple Vitamin (MULTIVITAMIN WITH MINERALS) TABS Take 1 tablet by mouth daily.    . ranitidine (ZANTAC) 150 MG tablet Take 150 mg by mouth at bedtime.    . saxagliptin HCl (ONGLYZA) 5 MG TABS tablet Take 5 mg by mouth daily.    Marland Kitchen topiramate (TOPAMAX) 25 MG capsule Take 25 mg by mouth 2 (two) times daily.    . cholecalciferol (VITAMIN D) 1000 UNITS tablet Take 1,000 Units by mouth daily.    . ferrous fumarate (FERRETTS) 325 (106 FE) MG TABS Take 1 tablet by mouth.    . gatifloxacin (ZYMAXID) 0.5 % SOLN Place 1 drop into the right eye 4 (four) times daily. (Patient not taking: Reported on 01/05/2015)    . omega-3 acid ethyl esters (LOVAZA) 1 G capsule Take 2 g by mouth 2 (two) times daily.    Marland Kitchen omeprazole (PRILOSEC) 40 MG capsule Take 40 mg by mouth daily.    . polyethylene glycol (MIRALAX / GLYCOLAX) packet Take 17 g by mouth daily. For constipation     No current facility-administered medications on file prior to visit.    Meds ordered this encounter  Medications  . omeprazole (PRILOSEC) 20 MG capsule    Sig: Take 20 mg by mouth daily.                                                          . montelukast (SINGULAIR) 10 MG tablet    Sig: Take 1 tablet (10 mg total) by mouth daily.    Dispense:  30 tablet    Refill:  5      Allergies  Allergen Reactions  . Amoxicillin     Hives   . Aspirin     unknown  . Other     Bee Stings  . Percocet [Oxycodone-Acetaminophen]     itching  . Sulfa Antibiotics     Hives   . Tape     & bandaids - rash/blisters under   . Latex Rash    Review of Systems  Constitutional: Negative for fever, chills, weight loss and malaise/fatigue.  HENT: Negative for congestion, ear pain, hearing loss, nosebleeds, sore throat and tinnitus.   Eyes: Negative for redness.  Respiratory: Negative for cough, sputum production, shortness of breath and wheezing.    Cardiovascular: Negative for chest pain and leg swelling.  Gastrointestinal: Negative for heartburn, nausea, vomiting, abdominal pain and diarrhea.  Skin: Negative for itching and rash.  Neurological: Negative for dizziness and headaches.     Objective:   Filed Vitals:   01/05/15 1224  BP: 126/62  Pulse: 80  Resp: 12    Physical Exam  Constitutional: She is oriented to person, place, and time and well-developed, well-nourished, and in no distress.  HENT:  Right Ear: External ear normal.  Left Ear: External ear normal.  Mouth/Throat: Oropharynx is clear and moist.  Eyes: Conjunctivae are normal.  Neck: No JVD present. No tracheal deviation present. No thyromegaly present.  Cardiovascular: Normal rate, regular rhythm and normal heart sounds.  Exam reveals no gallop.   No murmur heard. Pulmonary/Chest: No stridor. No respiratory distress. She has no wheezes. She has no rales. She exhibits no tenderness.  Musculoskeletal: She exhibits no edema.  Lymphadenopathy:    She has no cervical adenopathy.  Neurological: She is alert and oriented to person, place, and time.  Skin: No rash noted. No erythema. No pallor.    Diagnostics: None      Assessment and Plan:     Problem List Items Addressed This Visit      Respiratory   LPRD (laryngopharyngeal reflux disease) - Primary     1. Continue omeprazole  in AM and Ranitidine  PM 2. Continue tums use when drinking caffeine 3. Continue nasonex and montelukast. 4. Get a flu vaccine 7. Return in 6 months

## 2015-04-30 ENCOUNTER — Other Ambulatory Visit: Payer: Self-pay | Admitting: Allergy and Immunology

## 2015-05-19 ENCOUNTER — Encounter (INDEPENDENT_AMBULATORY_CARE_PROVIDER_SITE_OTHER): Payer: BC Managed Care – PPO | Admitting: Ophthalmology

## 2015-05-19 DIAGNOSIS — H43813 Vitreous degeneration, bilateral: Secondary | ICD-10-CM | POA: Diagnosis not present

## 2015-05-19 DIAGNOSIS — H35362 Drusen (degenerative) of macula, left eye: Secondary | ICD-10-CM | POA: Diagnosis not present

## 2015-05-19 DIAGNOSIS — H35033 Hypertensive retinopathy, bilateral: Secondary | ICD-10-CM

## 2015-05-19 DIAGNOSIS — H33302 Unspecified retinal break, left eye: Secondary | ICD-10-CM

## 2015-05-19 DIAGNOSIS — I10 Essential (primary) hypertension: Secondary | ICD-10-CM | POA: Diagnosis not present

## 2015-05-19 DIAGNOSIS — H2513 Age-related nuclear cataract, bilateral: Secondary | ICD-10-CM | POA: Diagnosis not present

## 2015-05-19 DIAGNOSIS — H338 Other retinal detachments: Secondary | ICD-10-CM

## 2015-05-25 ENCOUNTER — Ambulatory Visit (INDEPENDENT_AMBULATORY_CARE_PROVIDER_SITE_OTHER): Payer: BC Managed Care – PPO | Admitting: Allergy and Immunology

## 2015-05-25 ENCOUNTER — Encounter: Payer: Self-pay | Admitting: Allergy and Immunology

## 2015-05-25 VITALS — BP 124/82 | HR 64 | Resp 16

## 2015-05-25 DIAGNOSIS — H1010 Acute atopic conjunctivitis, unspecified eye: Secondary | ICD-10-CM | POA: Diagnosis not present

## 2015-05-25 DIAGNOSIS — J387 Other diseases of larynx: Secondary | ICD-10-CM

## 2015-05-25 DIAGNOSIS — J309 Allergic rhinitis, unspecified: Secondary | ICD-10-CM | POA: Diagnosis not present

## 2015-05-25 DIAGNOSIS — K219 Gastro-esophageal reflux disease without esophagitis: Secondary | ICD-10-CM

## 2015-05-25 DIAGNOSIS — J4520 Mild intermittent asthma, uncomplicated: Secondary | ICD-10-CM | POA: Diagnosis not present

## 2015-05-25 MED ORDER — ALBUTEROL SULFATE HFA 108 (90 BASE) MCG/ACT IN AERS: 2.0000 | INHALATION_SPRAY | RESPIRATORY_TRACT | Status: DC | PRN

## 2015-05-25 NOTE — Patient Instructions (Addendum)
  1. Use nasal saline multiple times a day  2. Can use over-the-counter Mucinex DM and over-the-counter Claritin  3. Can use ProAir HFA 2 puffs every 4-6 hours if needed  4. Continue omeprazole 20 mg in the morning and ranitidine 300 mg in the evening  5. Continue Nasonex 1-2 sprays each nostril daily  6. Continue montelukast 10 mg daily  7. Further treatment?  8. Return to clinic in 6 months or earlier if problem

## 2015-05-25 NOTE — Progress Notes (Signed)
Follow-up Note  Referring Provider: Abner Greenspan, MD Primary Provider: Abner Greenspan, MD Date of Office Visit: 05/25/2015  Subjective:   Jade Perez (DOB: [birth year]) is a 60 y.o. female who [facility] in re-evaluation of the following:  HPI Comments: Jade Perez returns to this clinic in reevaluation of her respiratory tract issue. She carries the diagnosis of LPR and a component of rhinitis and intermittent asthma that has been under excellent control since I've last seen her in his clinic 6 months ago without the requirement for systemic steroid to treat an exacerbation and without the requirement for using a short acting bronchodilator while she continues to use therapy including omeprazole 20  In the a.m. and ranitidine 300 in the p.m. and Nasonex and montelukast. Unfortunately, she developed problems with coughing this Saturday and felt achy and now developed nasal congestion and runny nose over the course the past several days. She did visit with Dr. Yetta Flock who changed her lisinopril to another antihypertensive medicine and gave her a nebulizer treatment with albuterol this past Monday.she's not had any high fever, ugly nasal discharge, ugly sputum production, chest pain or rash.   Current Outpatient Prescriptions on File Prior to Visit  Medication Sig Dispense Refill  . atorvastatin (LIPITOR) 40 MG tablet Take 40 mg by mouth daily.    Marland Kitchen b complex vitamins tablet Take 1 tablet by mouth daily.    Marland Kitchen CALCIUM PO Take 1,200 mg by mouth 2 (two) times daily.    . cetirizine (ZYRTEC) 10 MG tablet Take 10 mg by mouth daily.    . cholecalciferol (VITAMIN D) 1000 UNITS tablet Take 1,000 Units by mouth daily.    . citalopram (CELEXA) 40 MG tablet Take 40 mg by mouth daily.    . colesevelam (WELCHOL) 625 MG tablet Take 625 mg by mouth daily.    . dapagliflozin propanediol (FARXIGA) 10 MG TABS tablet Take 10 mg by mouth daily.    . fenofibrate 160 MG  tablet Take 160 mg by mouth at bedtime.    . Ferrous Sulfate Dried (SLOW RELEASE IRON) 45 MG TBCR Take 1 tablet by mouth daily.    Marland Kitchen FIBER PO Take 0.52 g by mouth daily.    . Liraglutide (VICTOZA) 18 MG/3ML SOLN Inject 1.8 mg into the skin daily.     . mometasone (NASONEX) 50 MCG/ACT nasal spray Place 2 sprays into the nose daily.    . montelukast (SINGULAIR) 10 MG tablet Take 1 tablet (10 mg total) by mouth daily. 30 tablet 5  . Multiple Vitamin (MULTIVITAMIN WITH MINERALS) TABS Take 1 tablet by mouth daily.    . Omega-3 Fatty Acids (FISH OIL PO) Take 2,400 mg by mouth 2 (two) times daily.    Marland Kitchen omeprazole (PRILOSEC) 20 MG capsule Take 20 mg by mouth daily.    Marland Kitchen omeprazole (PRILOSEC) 40 MG capsule Take 40 mg by mouth daily.    . ranitidine (ZANTAC) 150 MG tablet Take 150 mg by mouth at bedtime.    . ranitidine (ZANTAC) 300 MG tablet TAKE 1 TABLET BY MOUTH EVERY EVENING AS DIRECTED FOR REFLUX 30 tablet 5  . topiramate (TOPAMAX) 25 MG capsule Take 25 mg by mouth 2 (two) times daily.    . Vitamin D, Ergocalciferol, (DRISDOL) 50000 UNITS CAPS capsule Take 50,000 Units by mouth. TAKE ONE TABLET TWICE A MONTH.    Marland Kitchen Acetaminophen (TYLENOL EXTRA STRENGTH PO) Take 1 tablet by mouth as needed. Reported on  05/25/2015    . ferrous fumarate (FERRETTS) 325 (106 FE) MG TABS Take 1 tablet by mouth. Reported on 05/25/2015    . gatifloxacin (ZYMAXID) 0.5 % SOLN Place 1 drop into the right eye 4 (four) times daily. (Patient not taking: Reported on 01/05/2015)    . lisinopril (PRINIVIL,ZESTRIL) 10 MG tablet Take 10 mg by mouth daily. Reported on 05/25/2015    . omega-3 acid ethyl esters (LOVAZA) 1 G capsule Take 2 g by mouth 2 (two) times daily. Reported on 05/25/2015    . polyethylene glycol (MIRALAX / GLYCOLAX) packet Take 17 g by mouth daily. Reported on 05/25/2015    . saxagliptin HCl (ONGLYZA) 5 MG TABS tablet Take 5 mg by mouth daily. Reported on 05/25/2015     No current facility-administered medications on file  prior to visit.    Past Medical History  Diagnosis Date  . Complication of anesthesia     difficulty waking up  . Hypertension   . Diabetes mellitus   . High cholesterol   . Sleep apnea     sleep study in [place], has but does not use CPAP, Dr. Yetta Flock has results [facility]  . GERD (gastroesophageal reflux disease)   . Arthritis   . Anemia   . Kidney stone  SEPT. 2016    Past Surgical History  Procedure Laterality Date  . Appendectomy    . Lipoma excision    . Abdominal exploration surgery    . Scleral buckle  10/15/2011    Procedure: SCLERAL BUCKLE;  Surgeon: Sherrie George, MD;  Location[facility] OR;  Service: Ophthalmology;  Laterality: Right;    Allergies  Allergen Reactions  . Amoxicillin     Hives   . Aspirin     unknown  . Other     Bee Stings  . Percocet [Oxycodone-Acetaminophen]     itching  . Sulfa Antibiotics     Hives   . Tape     & bandaids - rash/blisters under   . Latex Rash    Review of systems negative except as noted in HPI / PMHx or noted below:  Review of Systems  Constitutional: Negative.   HENT: Negative.   Eyes: Negative.   Respiratory: Negative.   Cardiovascular: Negative.   Gastrointestinal: Negative.   Genitourinary: Negative.   Musculoskeletal: Negative.   Skin: Negative.   Neurological: Negative.   Endo/Heme/Allergies: Negative.   Psychiatric/Behavioral: Negative.      Objective:   Filed Vitals:   05/25/15 1020  BP: 124/82  Pulse: 64  Resp: 16          Physical Exam  Constitutional: She is well-developed, well-nourished, and in no distress.  Nasal voice, tissue in hand  HENT:  Head: Normocephalic.  Right Ear: Tympanic membrane, external ear and ear canal normal.  Left Ear: Tympanic membrane, external ear and ear canal normal.  Nose: Mucosal edema (Erythematous) present. No rhinorrhea.  Mouth/Throat: Uvula is midline, oropharynx is clear and moist and mucous membranes are normal. No oropharyngeal  exudate.  Eyes: Conjunctivae are normal.  Neck: Trachea normal. No tracheal tenderness present. No tracheal deviation present. No thyromegaly present.  Cardiovascular: Normal rate, regular rhythm, S1 normal, S2 normal and normal heart sounds.   No murmur heard. Pulmonary/Chest: Breath sounds normal. No stridor. No respiratory distress. She has no wheezes. She has no rales.  Musculoskeletal: She exhibits no edema.  Lymphadenopathy:       Head (right side): No tonsillar adenopathy present.  Head (left side): No tonsillar adenopathy present.    She has no cervical adenopathy.    She has no axillary adenopathy.  Neurological: She is alert. Gait normal.  Skin: No rash noted. She is not diaphoretic. No erythema. Nails show no clubbing.  Psychiatric: Mood and affect normal.    Diagnostics:    Spirometry was performed and demonstrated an FEV1 of 2.51 at 108 % of predicted.  The patient had an Asthma Control Test with the following results:  .    Assessment and Plan:   1. Asthma, mild intermittent, well-controlled   2. Allergic rhinoconjunctivitis   3. LPRD (laryngopharyngeal reflux disease)     1. Use nasal saline multiple times a day  2. Can use over-the-counter Mucinex DM and over-the-counter Claritin  3. Can use ProAir HFA 2 puffs every 4-6 hours if needed  4. Continue omeprazole 20 mg in the morning and ranitidine 300 mg in the evening  5. Continue Nasonex 1-2 sprays each nostril daily  6. Continue montelukast 10 mg daily  7. Further treatment?  8. Return to clinic in 6 months or earlier if problem  It appears that Jade Perez has developed a viral respiratory tract infection and we will approach her issue very conservatively as mentioned above. She was doing wonderful up until this event and I see no need for changing around her medical therapy and we'll just see her back in this clinic in 6 months or earlier if there is a problem.  Laurette Schimke, MD [facility]

## 2015-06-16 ENCOUNTER — Encounter (INDEPENDENT_AMBULATORY_CARE_PROVIDER_SITE_OTHER): Payer: BC Managed Care – PPO | Admitting: Ophthalmology

## 2015-06-16 DIAGNOSIS — H2513 Age-related nuclear cataract, bilateral: Secondary | ICD-10-CM | POA: Diagnosis not present

## 2015-06-16 DIAGNOSIS — H35033 Hypertensive retinopathy, bilateral: Secondary | ICD-10-CM | POA: Diagnosis not present

## 2015-06-16 DIAGNOSIS — H338 Other retinal detachments: Secondary | ICD-10-CM | POA: Diagnosis not present

## 2015-06-16 DIAGNOSIS — H33302 Unspecified retinal break, left eye: Secondary | ICD-10-CM | POA: Diagnosis not present

## 2015-06-16 DIAGNOSIS — I10 Essential (primary) hypertension: Secondary | ICD-10-CM

## 2015-06-16 DIAGNOSIS — H43813 Vitreous degeneration, bilateral: Secondary | ICD-10-CM

## 2015-06-21 ENCOUNTER — Ambulatory Visit: Payer: BC Managed Care – PPO | Admitting: Allergy and Immunology

## 2015-07-12 ENCOUNTER — Other Ambulatory Visit: Payer: Self-pay

## 2015-07-12 DIAGNOSIS — Z1231 Encounter for screening mammogram for malignant neoplasm of breast: Secondary | ICD-10-CM

## 2015-07-14 ENCOUNTER — Ambulatory Visit (INDEPENDENT_AMBULATORY_CARE_PROVIDER_SITE_OTHER): Payer: BC Managed Care – PPO | Admitting: Ophthalmology

## 2015-08-02 ENCOUNTER — Ambulatory Visit: Payer: BC Managed Care – PPO

## 2015-08-03 ENCOUNTER — Ambulatory Visit
Admission: RE | Admit: 2015-08-03 | Discharge: 2015-08-03 | Disposition: A | Discharge status: Home / Self Care | Payer: BC Managed Care – PPO | Source: Ambulatory Visit

## 2015-08-03 DIAGNOSIS — Z1231 Encounter for screening mammogram for malignant neoplasm of breast: Secondary | ICD-10-CM

## 2015-09-08 ENCOUNTER — Encounter (INDEPENDENT_AMBULATORY_CARE_PROVIDER_SITE_OTHER): Payer: BC Managed Care – PPO | Admitting: Ophthalmology

## 2015-09-25 ENCOUNTER — Encounter (INDEPENDENT_AMBULATORY_CARE_PROVIDER_SITE_OTHER): Payer: BC Managed Care – PPO | Admitting: Ophthalmology

## 2015-09-25 DIAGNOSIS — I10 Essential (primary) hypertension: Secondary | ICD-10-CM

## 2015-09-25 DIAGNOSIS — H43813 Vitreous degeneration, bilateral: Secondary | ICD-10-CM

## 2015-09-25 DIAGNOSIS — H2513 Age-related nuclear cataract, bilateral: Secondary | ICD-10-CM | POA: Diagnosis not present

## 2015-09-25 DIAGNOSIS — H35033 Hypertensive retinopathy, bilateral: Secondary | ICD-10-CM | POA: Diagnosis not present

## 2015-09-25 DIAGNOSIS — H338 Other retinal detachments: Secondary | ICD-10-CM

## 2015-09-25 DIAGNOSIS — H33302 Unspecified retinal break, left eye: Secondary | ICD-10-CM

## 2015-10-23 ENCOUNTER — Ambulatory Visit (INDEPENDENT_AMBULATORY_CARE_PROVIDER_SITE_OTHER): Payer: BC Managed Care – PPO | Admitting: Allergy and Immunology

## 2015-10-23 ENCOUNTER — Encounter: Payer: Self-pay | Admitting: Allergy and Immunology

## 2015-10-23 VITALS — BP 108/74 | HR 68 | Resp 18

## 2015-10-23 DIAGNOSIS — J309 Allergic rhinitis, unspecified: Secondary | ICD-10-CM

## 2015-10-23 DIAGNOSIS — J4520 Mild intermittent asthma, uncomplicated: Secondary | ICD-10-CM | POA: Diagnosis not present

## 2015-10-23 DIAGNOSIS — H1010 Acute atopic conjunctivitis, unspecified eye: Secondary | ICD-10-CM | POA: Diagnosis not present

## 2015-10-23 DIAGNOSIS — J387 Other diseases of larynx: Secondary | ICD-10-CM | POA: Diagnosis not present

## 2015-10-23 DIAGNOSIS — K219 Gastro-esophageal reflux disease without esophagitis: Secondary | ICD-10-CM

## 2015-10-23 MED ORDER — OMEPRAZOLE 40 MG PO CPDR: DELAYED_RELEASE_CAPSULE | ORAL | Status: DC

## 2015-10-23 NOTE — Patient Instructions (Signed)
  1. Use nasal saline multiple times a day  2. Can use over-the-counter Mucinex DM and over-the-counter Claritin  3. Can use ProAir HFA 2 puffs every 4-6 hours if needed  4. Increase omeprazole 40 mg in the morning and ranitidine 300 mg in the evening  5. Continue Nasonex or Nasacort 1-2 sprays each nostril daily  6. Consistently use montelukast 10 mg daily  7. Further treatment?  8. Return to clinic in 6 months or earlier if problem  9. Obtain fall flu vaccine

## 2015-10-23 NOTE — Progress Notes (Signed)
Follow-up Note  Referring Provider: Abner GreenspanHodges, Beth, MD Primary Provider: Abner GreenspanHODGES,BETH, MD Date of Office Visit: 10/23/2015  Subjective:   Jade Perez (DOB: 1955-07-20) is a 60 y.o. female [facility] in re-evaluation of the following:  HPI: Jade Perez returns to this clinic in reevaluation of her asthma, reflux-induced respiratory disease, and allergic rhinitis. I've not seen her in his clinic in approximately 6 months.  Apparently she developed a prolonged cough with this sensation of something in the back of her throat that was evaluated by her primary care physician and not given any specific therapy. This developed in April and it improved in mid June but she still continues to have a little bit of a cough. In addition, she's had problems with ear popping intermittently throughout this time. Her ear popping return this past week. She did use her short-acting bronchodilator when she was coughing but it did not really resulted in any improvement. She did not have any obvious reflux that she could detect. She did not have any problems with her nose regarding congestion or headaches or ugly nasal discharge. In the past she has seen Dr. Daryl Easternharlie West in evaluation of her throat issue and has had rhinoscopy performed.    Medication List           albuterol 108 (90 Base) MCG/ACT inhaler  Commonly known as:  PROAIR HFA  Inhale 2 puffs into the lungs every 4 (four) hours as needed for wheezing or shortness of breath.     atorvastatin 40 MG tablet  Commonly known as:  LIPITOR  Take 40 mg by mouth daily.     b complex vitamins tablet  Take 1 tablet by mouth daily.     CALCIUM PO  Take 1,200 mg by mouth 2 (two) times daily.     cetirizine 10 MG tablet  Commonly known as:  ZYRTEC  Take 10 mg by mouth daily.     cholecalciferol 1000 units tablet  Commonly known as:  VITAMIN D  Take 1,000 Units by mouth daily.     citalopram 40 MG tablet    Commonly known as:  CELEXA  Take 40 mg by mouth daily.     FARXIGA 10 MG Tabs tablet  Generic drug:  dapagliflozin propanediol  Take 10 mg by mouth daily.     FARXIGA 10 MG Tabs tablet  Generic drug:  dapagliflozin propanediol  Take 10 mg by mouth daily.     fenofibrate 160 MG tablet  Take 160 mg by mouth at bedtime.     FIBER PO  Take 0.52 g by mouth daily.     FISH OIL PO  Take 2,400 mg by mouth 2 (two) times daily.     glimepiride 1 MG tablet  Commonly known as:  AMARYL  Take 1 mg by mouth 2 (two) times daily.     losartan 25 MG tablet  Commonly known as:  COZAAR  Take 25 mg by mouth daily.     mometasone 50 MCG/ACT nasal spray  Commonly known as:  NASONEX  Place 2 sprays into the nose daily.     montelukast 10 MG tablet  Commonly known as:  SINGULAIR  Take 1 tablet (10 mg total) by mouth daily.     multivitamin with minerals Tabs tablet  Take 1 tablet by mouth daily.     omeprazole 20 MG capsule  Commonly known as:  PRILOSEC  Take 20 mg by  mouth daily.     ranitidine 300 MG tablet  Commonly known as:  ZANTAC  TAKE 1 TABLET BY MOUTH EVERY EVENING AS DIRECTED FOR REFLUX     SLOW RELEASE IRON 45 MG Tbcr  Generic drug:  Ferrous Sulfate Dried  Take 1 tablet by mouth daily.     topiramate 25 MG capsule  Commonly known as:  TOPAMAX  Take 25 mg by mouth 2 (two) times daily.     TRADJENTA 5 MG Tabs tablet  Generic drug:  linagliptin  Take 5 mg by mouth daily.     VICTOZA 18 MG/3ML Soln injection  Generic drug:  Liraglutide  Inject 1.8 mg into the skin daily.     Vitamin D (Ergocalciferol) 50000 units Caps capsule  Commonly known as:  DRISDOL  Take 50,000 Units by mouth. TAKE ONE TABLET TWICE A MONTH.     WELCHOL 625 MG tablet  Generic drug:  colesevelam  Take 625 mg by mouth daily.        Past Medical History  Diagnosis Date  . Complication of anesthesia     difficulty waking up  . Hypertension   . Diabetes mellitus   . High cholesterol    . Sleep apnea     sleep study in [place], has but does not use CPAP, Dr. Yetta Flock has results [facility]  . GERD (gastroesophageal reflux disease)   . Arthritis   . Anemia   . Kidney stone  SEPT. 2016    Past Surgical History  Procedure Laterality Date  . Appendectomy    . Lipoma excision    . Abdominal exploration surgery    . Scleral buckle  10/15/2011    Procedure: SCLERAL BUCKLE;  Surgeon: Sherrie George, MD;  Location[facility] OR;  Service: Ophthalmology;  Laterality: Right;    Allergies  Allergen Reactions  . Amoxicillin     Hives   . Aspirin     unknown  . Other     Bee Stings  . Percocet [Oxycodone-Acetaminophen]     itching  . Sulfa Antibiotics     Hives   . Tape     & bandaids - rash/blisters under   . Latex Rash    Review of systems negative except as noted in HPI / PMHx or noted below:  Review of Systems  Constitutional: Negative.   HENT: Negative.   Eyes: Negative.   Respiratory: Negative.   Cardiovascular: Negative.   Gastrointestinal: Negative.   Genitourinary: Negative.   Musculoskeletal: Negative.   Skin: Negative.   Neurological: Negative.   Endo/Heme/Allergies: Negative.   Psychiatric/Behavioral: Negative.      Objective:   Filed Vitals:   10/23/15 1010  BP: 108/74  Pulse: 68  Resp: 18          Physical Exam  Constitutional: She is well-developed, well-nourished, and in no distress.  HENT:  Head: Normocephalic.  Right Ear: External ear and ear canal normal. Tympanic membrane mobility is abnormal.  Left Ear: External ear and ear canal normal. Tympanic membrane mobility is abnormal.  Nose: Nose normal. No mucosal edema or rhinorrhea.  Mouth/Throat: Uvula is midline, oropharynx is clear and moist and mucous membranes are normal. No oropharyngeal exudate.  Eyes: Conjunctivae are normal.  Neck: Trachea normal. No tracheal tenderness present. No tracheal deviation present. No thyromegaly present.  Cardiovascular: Normal  rate, regular rhythm, S1 normal, S2 normal and normal heart sounds.   No murmur heard. Pulmonary/Chest: Breath sounds normal. No stridor. No respiratory  distress. She has no wheezes. She has no rales.  Musculoskeletal: She exhibits no edema.  Lymphadenopathy:       Head (right side): No tonsillar adenopathy present.       Head (left side): No tonsillar adenopathy present.    She has no cervical adenopathy.  Neurological: She is alert. Gait normal.  Skin: No rash noted. She is not diaphoretic. No erythema. Nails show no clubbing.  Psychiatric: Mood and affect normal.    Diagnostics:    Spirometry was performed and demonstrated an FEV1 of 2.46 at 106 % of predicted.  The patient had an Asthma Control Test with the following results:  .    Assessment and Plan:   1. Asthma, mild intermittent, well-controlled   2. Allergic rhinoconjunctivitis   3. LPRD (laryngopharyngeal reflux disease)      1. Use nasal saline multiple times a day  2. Can use over-the-counter Mucinex DM and over-the-counter Claritin  3. Can use ProAir HFA 2 puffs every 4-6 hours if needed  4. Increase omeprazole 40 mg in the morning and ranitidine 300 mg in the evening  5. Continue Nasonex or Nasacort 1-2 sprays each nostril daily  6. Consistently use montelukast 10 mg daily  7. Further treatment?  8. Return to clinic in 6 months or earlier if problem  9. Obtain fall flu vaccine  I will have Jade Lose consistently use a combination of a nasal steroid and montelukast and get her to be a little bit more aggressive about treating her reflux by increasing her dose of omeprazole and we'll see what happens over the course of the next several weeks regarding her lingering cough and her ETD. She'll contact me noting her response and if she does well we'll see her back in this clinic in 6 months or earlier if there is a problem.It should be noted that she has decreased her omeprazole and her ranitidine because of what  sounds like the development of gastric polyps and I've asked her to remain on this intense reflux treatment plan for at least a month or so to make sure that she does completely resolve her cough.  Laurette Schimke, MD [facility]

## 2016-01-16 ENCOUNTER — Other Ambulatory Visit: Payer: Self-pay | Admitting: Allergy and Immunology

## 2016-03-27 ENCOUNTER — Other Ambulatory Visit: Payer: Self-pay | Admitting: Allergy and Immunology

## 2016-04-02 ENCOUNTER — Other Ambulatory Visit: Payer: Self-pay | Admitting: Allergy and Immunology

## 2016-04-25 ENCOUNTER — Ambulatory Visit: Payer: BC Managed Care – PPO | Admitting: Allergy and Immunology

## 2016-05-02 ENCOUNTER — Ambulatory Visit (INDEPENDENT_AMBULATORY_CARE_PROVIDER_SITE_OTHER): Payer: BC Managed Care – PPO | Admitting: Allergy and Immunology

## 2016-05-02 ENCOUNTER — Encounter: Payer: Self-pay | Admitting: Allergy and Immunology

## 2016-05-02 VITALS — BP 120/70 | HR 68 | Resp 16

## 2016-05-02 DIAGNOSIS — K219 Gastro-esophageal reflux disease without esophagitis: Secondary | ICD-10-CM

## 2016-05-02 DIAGNOSIS — J3089 Other allergic rhinitis: Secondary | ICD-10-CM | POA: Diagnosis not present

## 2016-05-02 DIAGNOSIS — J4520 Mild intermittent asthma, uncomplicated: Secondary | ICD-10-CM

## 2016-05-02 NOTE — Progress Notes (Signed)
Follow-up Note  Referring Provider: Abner Greenspan, MD Primary Provider: Abner Greenspan, MD Date of Office Visit: 05/02/2016  Subjective:   Jade Perez (DOB: 09/29/1955) is a 61 y.o. female who [facility]2016 in re-evaluation of the following:  HPI: Jade Perez returns to this clinic in reevaluation of her asthma and allergic rhinitis and reflux-induced respiratory disease. I last saw her in this clinic in July 2017. At that point in time we increased her therapy for reflux as there appeared to be a component of uncontrolled LPR.  She has had an excellent interval. She's had no problems with asthma and has not required a systemic steroid to treat an exacerbation and rarely uses a short acting bronchodilator and can exert herself without any problem.  Her nose has been doing quite well and she has not required an antibiotic to treat an episode of sinusitis.  Her throat is doing quite well as is her classic reflux while consistently using her current medical therapy.  She did receive the flu vaccine this year.  Allergies as of 05/02/2016      Reactions   Amoxicillin    Hives   Aspirin    unknown   Other    Bee Stings   Percocet [oxycodone-acetaminophen]    itching   Sulfa Antibiotics    Hives   Tape    & bandaids - rash/blisters under    Latex Rash      Medication List      albuterol 108 (90 Base) MCG/ACT inhaler Commonly known as:  PROAIR HFA Inhale 2 puffs into the lungs every 4 (four) hours as needed for wheezing or shortness of breath.   atorvastatin 40 MG tablet Commonly known as:  LIPITOR Take 40 mg by mouth daily.   b complex vitamins tablet Take 1 tablet by mouth daily.   CALCIUM PO Take 1,200 mg by mouth 2 (two) times daily.   cetirizine 10 MG tablet Commonly known as:  ZYRTEC Take 10 mg by mouth daily.   cholecalciferol 1000 units tablet Commonly known as:  VITAMIN D Take 1,000 Units by mouth daily.     citalopram 40 MG tablet Commonly known as:  CELEXA Take 40 mg by mouth daily.   FARXIGA 10 MG Tabs tablet Generic drug:  dapagliflozin propanediol Take 10 mg by mouth daily.   FARXIGA 10 MG Tabs tablet Generic drug:  dapagliflozin propanediol Take 10 mg by mouth daily.   fenofibrate 160 MG tablet Take 160 mg by mouth at bedtime.   FIBER PO Take 0.52 g by mouth daily.   FISH OIL PO Take 2,400 mg by mouth 2 (two) times daily.   glimepiride 1 MG tablet Commonly known as:  AMARYL Take 1 mg by mouth 2 (two) times daily.   losartan 25 MG tablet Commonly known as:  COZAAR Take 25 mg by mouth daily.   mometasone 50 MCG/ACT nasal spray Commonly known as:  NASONEX Place 2 sprays into the nose daily.   montelukast 10 MG tablet Commonly known as:  SINGULAIR TAKE 1 TABLET EVERY DAY   multivitamin with minerals Tabs tablet Take 1 tablet by mouth daily.   omeprazole 40 MG capsule Commonly known as:  PRILOSEC TAKE ONE CAPSULE EVERY MORNING BEFORE BREAKFAST   ranitidine 300 MG tablet Commonly known as:  ZANTAC TAKE 1 TABLET BY MOUTH EVERY EVENING AS DIRECTED FOR REFLUX   SLOW RELEASE IRON 45 MG Tbcr Generic drug:  Ferrous Sulfate  Dried Take 1 tablet by mouth daily.   topiramate 25 MG capsule Commonly known as:  TOPAMAX Take 25 mg by mouth 2 (two) times daily.   TRADJENTA 5 MG Tabs tablet Generic drug:  linagliptin Take 5 mg by mouth daily.   VICTOZA 18 MG/3ML Soln injection Generic drug:  Liraglutide Inject 1.8 mg into the skin daily.   Vitamin D (Ergocalciferol) 50000 units Caps capsule Commonly known as:  DRISDOL Take 50,000 Units by mouth. TAKE ONE TABLET TWICE A MONTH.   WELCHOL 625 MG tablet Generic drug:  colesevelam Take 625 mg by mouth daily.       Past Medical History:  Diagnosis Date  . Anemia   . Arthritis   . Complication of anesthesia    difficulty waking up  . Diabetes mellitus   . GERD (gastroesophageal reflux disease)   . High  cholesterol   . Hypertension   . Kidney stone  SEPT. 2016  . Sleep apnea    sleep study in [place], has but does not use CPAP, Dr. Yetta FlockHodges has results [facility]    Past Surgical History:  Procedure Laterality Date  . ABDOMINAL EXPLORATION SURGERY    . APPENDECTOMY    . LIPOMA EXCISION    . SCLERAL BUCKLE  10/15/2011   Procedure: SCLERAL BUCKLE;  Surgeon: Sherrie GeorgeJohn D Matthews, MD;  [facility];  Service: Ophthalmology;  Laterality: Right;    Review of systems negative except as noted in HPI / PMHx or noted below:  Review of Systems  Constitutional: Negative.   HENT: Negative.   Eyes: Negative.   Respiratory: Negative.   Cardiovascular: Negative.   Gastrointestinal: Negative.   Genitourinary: Negative.   Musculoskeletal: Negative.   Skin: Negative.   Neurological: Negative.   Endo/Heme/Allergies: Negative.   Psychiatric/Behavioral: Negative.      Objective:   Vitals:   05/02/16 1053  BP: 120/70  Pulse: 68  Resp: 16          Physical Exam  Constitutional: She is well-developed, well-nourished, and in no distress.  HENT:  Head: Normocephalic.  Right Ear: Tympanic membrane, external ear and ear canal normal.  Left Ear: Tympanic membrane, external ear and ear canal normal.  Nose: Nose normal. No mucosal edema or rhinorrhea.  Mouth/Throat: Uvula is midline, oropharynx is clear and moist and mucous membranes are normal. No oropharyngeal exudate.  Eyes: Conjunctivae are normal.  Neck: Trachea normal. No tracheal tenderness present. No tracheal deviation present. No thyromegaly present.  Cardiovascular: Normal rate, regular rhythm, S1 normal, S2 normal and normal heart sounds.   No murmur heard. Pulmonary/Chest: Breath sounds normal. No stridor. No respiratory distress. She has no wheezes. She has no rales.  Musculoskeletal: She exhibits no edema.  Lymphadenopathy:       Head (right side): No tonsillar adenopathy present.       Head (left side): No  tonsillar adenopathy present.    She has no cervical adenopathy.  Neurological: She is alert. Gait normal.  Skin: No rash noted. She is not diaphoretic. No erythema. Nails show no clubbing.  Psychiatric: Mood and affect normal.    Diagnostics:   The patient had an Asthma Control Test with the following results: ACT Total Score: 25.    Assessment and Plan:   1. Asthma, mild intermittent, well-controlled   2. Other allergic rhinitis   3. LPRD (laryngopharyngeal reflux disease)     1. Continue omeprazole 40 mg in the morning and ranitidine 300 mg in the evening  2. Continue Nasacort 1-2 sprays each nostril daily  3. Consistently use montelukast 10 mg daily  4. Can use antihistamine, mucinex DM, nasal saline, Proair if needed  5. Return to clinic in 6 months or earlier if problem  Jade Perez has really done quite well on her current therapy and she will continue to use a combination of omeprazole and ranitidine and a nasal steroid as noted above as well as a leukotriene modifier and I will see her back in this clinic in 6 months or earlier if there is a problem.  Laurette Schimke, MD [facility]

## 2016-05-02 NOTE — Patient Instructions (Addendum)
  1. Continue omeprazole 40 mg in the morning and ranitidine 300 mg in the evening  2. Continue Nasacort 1-2 sprays each nostril daily  3. Consistently use montelukast 10 mg daily  4. Can use antihistamine, mucinex DM, nasal saline, Proair if needed  5. Return to clinic in 6 months or earlier if problem

## 2016-05-08 ENCOUNTER — Other Ambulatory Visit: Payer: Self-pay | Admitting: Allergy and Immunology

## 2016-05-29 ENCOUNTER — Other Ambulatory Visit: Payer: Self-pay | Admitting: Allergy and Immunology

## 2016-05-30 ENCOUNTER — Other Ambulatory Visit: Payer: Self-pay | Admitting: Allergy and Immunology

## 2016-07-17 ENCOUNTER — Other Ambulatory Visit: Payer: Self-pay | Admitting: Allergy and Immunology

## 2016-09-09 DIAGNOSIS — M2011 Hallux valgus (acquired), right foot: Secondary | ICD-10-CM | POA: Insufficient documentation

## 2016-09-16 ENCOUNTER — Other Ambulatory Visit: Payer: Self-pay | Admitting: Family Medicine

## 2016-09-16 DIAGNOSIS — Z1231 Encounter for screening mammogram for malignant neoplasm of breast: Secondary | ICD-10-CM

## 2016-09-30 ENCOUNTER — Ambulatory Visit
Admission: RE | Admit: 2016-09-30 | Discharge: 2016-09-30 | Disposition: A | Discharge status: Home / Self Care | Payer: BC Managed Care – PPO | Source: Ambulatory Visit | Attending: Family Medicine | Admitting: Family Medicine

## 2016-09-30 ENCOUNTER — Ambulatory Visit (INDEPENDENT_AMBULATORY_CARE_PROVIDER_SITE_OTHER): Payer: BC Managed Care – PPO | Admitting: Ophthalmology

## 2016-09-30 DIAGNOSIS — I10 Essential (primary) hypertension: Secondary | ICD-10-CM

## 2016-09-30 DIAGNOSIS — H35033 Hypertensive retinopathy, bilateral: Secondary | ICD-10-CM | POA: Diagnosis not present

## 2016-09-30 DIAGNOSIS — H338 Other retinal detachments: Secondary | ICD-10-CM

## 2016-09-30 DIAGNOSIS — Z1231 Encounter for screening mammogram for malignant neoplasm of breast: Secondary | ICD-10-CM

## 2016-09-30 DIAGNOSIS — H33302 Unspecified retinal break, left eye: Secondary | ICD-10-CM | POA: Diagnosis not present

## 2016-09-30 DIAGNOSIS — H43813 Vitreous degeneration, bilateral: Secondary | ICD-10-CM

## 2016-10-21 ENCOUNTER — Other Ambulatory Visit: Payer: Self-pay | Admitting: Allergy and Immunology

## 2016-10-24 ENCOUNTER — Encounter: Payer: Self-pay | Admitting: Allergy and Immunology

## 2016-10-24 ENCOUNTER — Ambulatory Visit (INDEPENDENT_AMBULATORY_CARE_PROVIDER_SITE_OTHER): Payer: BC Managed Care – PPO | Admitting: Allergy and Immunology

## 2016-10-24 VITALS — BP 120/80 | HR 60 | Resp 16

## 2016-10-24 DIAGNOSIS — K219 Gastro-esophageal reflux disease without esophagitis: Secondary | ICD-10-CM

## 2016-10-24 DIAGNOSIS — J3089 Other allergic rhinitis: Secondary | ICD-10-CM | POA: Diagnosis not present

## 2016-10-24 DIAGNOSIS — J4520 Mild intermittent asthma, uncomplicated: Secondary | ICD-10-CM

## 2016-10-24 NOTE — Progress Notes (Signed)
Follow-up Note  Referring Provider: Abner Greenspan, MD Primary Provider: Abner Greenspan, MD Date of Office Visit: 10/24/2016  Subjective:   Jade Perez (DOB: [birth year]) is a 61 y.o. female who [facility]2016 in re-evaluation of the following:  HPI: [name] returns to this clinic in reevaluation of her asthma and allergic rhinitis and reflux-induced respiratory disease. I last saw her in this clinic January 2018.  In May she developed a febrile illness associated with laryngitis and a few other respiratory tract symptoms that required approximately 3 weeks to resolve and did require the administration of systemic steroids.  Other than that one viral-induced event with her respiratory tract she has really done very well and has not required a systemic steroid to treat an exacerbation and rarely uses any short acting bronchodilator and can exercise without much difficulty.  Likewise her nose has really done quite well and she has not required an antibiotic to treat an episode of sinusitis. She continues on Nasacort and montelukast.  Reflux has been under excellent control on her current medical therapy which includes a proton pump inhibitor in the morning and an H2 receptor blocker in the evening. If she misses either one of these she develops burning and pain.  Allergies as of 10/24/2016      Reactions   Amoxicillin    Hives   Aspirin    unknown   Other    Bee Stings   Percocet [oxycodone-acetaminophen]    itching   Sulfa Antibiotics    Hives   Tape    & bandaids - rash/blisters under    Latex Rash      Medication List      albuterol 108 (90 Base) MCG/ACT inhaler Commonly known as:  PROAIR HFA Inhale 2 puffs into the lungs every 4 (four) hours as needed for wheezing or shortness of breath.   atorvastatin 40 MG tablet Commonly known as:  LIPITOR Take 40 mg by mouth daily.   b complex vitamins tablet Take 1 tablet by mouth  daily.   CALCIUM PO Take 1,200 mg by mouth 2 (two) times daily.   cetirizine 10 MG tablet Commonly known as:  ZYRTEC Take 10 mg by mouth daily.   cholecalciferol 1000 units tablet Commonly known as:  VITAMIN D Take 1,000 Units by mouth daily.   citalopram 40 MG tablet Commonly known as:  CELEXA Take 40 mg by mouth daily.   FARXIGA 10 MG Tabs tablet Generic drug:  dapagliflozin propanediol Take 10 mg by mouth daily.   fenofibrate 160 MG tablet Take 160 mg by mouth at bedtime.   FIBER PO Take 0.52 g by mouth daily.   FISH OIL PO Take 2,400 mg by mouth 2 (two) times daily.   glimepiride 1 MG tablet Commonly known as:  AMARYL Take 1 mg by mouth 2 (two) times daily.   losartan 25 MG tablet Commonly known as:  COZAAR Take 25 mg by mouth daily.   montelukast 10 MG tablet Commonly known as:  SINGULAIR TAKE 1 TABLET EVERY DAY   multivitamin with minerals Tabs tablet Take 1 tablet by mouth daily.   NASACORT ALLERGY 24HR 55 MCG/ACT Aero nasal inhaler Generic drug:  triamcinolone Place 2 sprays into the nose daily.   omeprazole 40 MG capsule Commonly known as:  PRILOSEC TAKE ONE CAPSULE EVERY MORNING BEFORE BREAKFAST   ranitidine 300 MG tablet Commonly known as:  ZANTAC TAKE 1 TABLET BY MOUTH EVERY  EVENING AS DIRECTED FOR REFLUX   SLOW RELEASE IRON 45 MG Tbcr Generic drug:  Ferrous Sulfate Dried Take 1 tablet by mouth daily.   topiramate 25 MG capsule Commonly known as:  TOPAMAX Take 25 mg by mouth 2 (two) times daily.   TRADJENTA 5 MG Tabs tablet Generic drug:  linagliptin Take 5 mg by mouth daily.   VICTOZA 18 MG/3ML Soln injection Generic drug:  Liraglutide Inject 1.8 mg into the skin daily.   Vitamin D (Ergocalciferol) 50000 units Caps capsule Commonly known as:  DRISDOL Take 50,000 Units by mouth. TAKE ONE TABLET TWICE A MONTH.   WELCHOL 625 MG tablet Generic drug:  colesevelam Take 625 mg by mouth daily.       Past Medical History:    Diagnosis Date  . Anemia   . Arthritis   . Complication of anesthesia    difficulty waking up  . Diabetes mellitus   . GERD (gastroesophageal reflux disease)   . High cholesterol   . Hypertension   . Kidney stone  SEPT. 2016  . Sleep apnea    sleep study in , has but does not use CPAP, Dr. Yetta Flock has results [facility]    Past Surgical History:  Procedure Laterality Date  . ABDOMINAL EXPLORATION SURGERY    . APPENDECTOMY    . BREAST EXCISIONAL BIOPSY Left    benign  . LIPOMA EXCISION    . SCLERAL BUCKLE  10/15/2011   Procedure: SCLERAL BUCKLE;  Surgeon: Sherrie George, MD;  Location: [facility] OR;  Service: Ophthalmology;  Laterality: Right;    Review of systems negative except as noted in HPI / PMHx or noted below:  Review of Systems  Constitutional: Negative.   HENT: Negative.   Eyes: Negative.   Respiratory: Negative.   Cardiovascular: Negative.   Gastrointestinal: Negative.   Genitourinary: Negative.   Musculoskeletal: Negative.   Skin: Negative.   Neurological: Negative.   Endo/Heme/Allergies: Negative.   Psychiatric/Behavioral: Negative.      Objective:   Vitals:   10/24/16 1047  BP: 120/80  Pulse: 60  Resp: 16          Physical Exam  Constitutional: She is well-developed, well-nourished, and in no distress.  HENT:  Head: Normocephalic.  Right Ear: Tympanic membrane, external ear and ear canal normal.  Left Ear: Tympanic membrane, external ear and ear canal normal.  Nose: Nose normal. No mucosal edema or rhinorrhea.  Mouth/Throat: Uvula is midline, oropharynx is clear and moist and mucous membranes are normal. No oropharyngeal exudate.  Eyes: Conjunctivae are normal.  Neck: Trachea normal. No tracheal tenderness present. No tracheal deviation present. No thyromegaly present.  Cardiovascular: Normal rate, regular rhythm, S1 normal, S2 normal and normal heart sounds.   No murmur heard. Pulmonary/Chest: Breath sounds normal. No  stridor. No respiratory distress. She has no wheezes. She has no rales.  Musculoskeletal: She exhibits no edema.  Lymphadenopathy:       Head (right side): No tonsillar adenopathy present.       Head (left side): No tonsillar adenopathy present.    She has no cervical adenopathy.  Neurological: She is alert. Gait normal.  Skin: No rash noted. She is not diaphoretic. No erythema. Nails show no clubbing.  Psychiatric: Mood and affect normal.    Diagnostics:    Spirometry was performed and demonstrated an FEV1 of 2.29 at 100 % of predicted.  The patient had an Asthma Control Test with the following results: ACT Total Score: 25.  Assessment and Plan:   1. Asthma, mild intermittent, well-controlled   2. Other allergic rhinitis   3. LPRD (laryngopharyngeal reflux disease)     1. Continue omeprazole 40 mg in the morning and ranitidine 300 mg in the evening  2. Continue Nasacort 1-2 sprays each nostril daily  3. Consistently use montelukast 10 mg daily  4. Can use antihistamine and Proair HFA if needed  5. Return to clinic in 6 months or earlier if problem  6. Obtain fall flu vaccine  7. Obtain Prevnar and Pneumovax  8. Obtain shingles vaccine  Consuella Loselaine appears to be doing very well on her current medical therapy which includes pretty aggressive therapy directed against her reflux and the use of some anti-inflammatory agents for her respiratory tract. She will continue on this form of treatment at this point in time and I will see her back in this clinic in 6 months. I did make some recommendations concerning immunizations for this coming fall as described above.  Laurette SchimkeEric Naidelyn Parrella, MD Allergy / Immunology [facility]

## 2016-10-24 NOTE — Patient Instructions (Addendum)
  1. Continue omeprazole 40 mg in the morning and ranitidine 300 mg in the evening  2. Continue Nasacort 1-2 sprays each nostril daily  3. Consistently use montelukast 10 mg daily  4. Can use antihistamine and Proair HFA if needed  5. Return to clinic in 6 months or earlier if problem  6. Obtain fall flu vaccine  7. Obtain Prevnar and Pneumovax  8. Obtain shingles vaccine

## 2016-10-31 ENCOUNTER — Other Ambulatory Visit: Payer: Self-pay | Admitting: Allergy and Immunology

## 2016-11-09 ENCOUNTER — Other Ambulatory Visit: Payer: Self-pay | Admitting: Allergy and Immunology

## 2016-11-18 ENCOUNTER — Other Ambulatory Visit: Payer: Self-pay | Admitting: Allergy and Immunology

## 2017-02-07 DIAGNOSIS — M542 Cervicalgia: Secondary | ICD-10-CM | POA: Insufficient documentation

## 2017-02-07 DIAGNOSIS — G5603 Carpal tunnel syndrome, bilateral upper limbs: Secondary | ICD-10-CM | POA: Insufficient documentation

## 2017-02-07 DIAGNOSIS — M79641 Pain in right hand: Secondary | ICD-10-CM | POA: Insufficient documentation

## 2017-02-07 DIAGNOSIS — M180 Bilateral primary osteoarthritis of first carpometacarpal joints: Secondary | ICD-10-CM | POA: Insufficient documentation

## 2017-03-08 HISTORY — PX: EXTRACORPOREAL SHOCK WAVE LITHOTRIPSY: SHX1557

## 2017-03-08 HISTORY — PX: LITHOTRIPSY: SUR834

## 2017-03-12 DIAGNOSIS — N201 Calculus of ureter: Secondary | ICD-10-CM | POA: Diagnosis not present

## 2017-03-12 DIAGNOSIS — I10 Essential (primary) hypertension: Secondary | ICD-10-CM | POA: Diagnosis not present

## 2017-03-12 DIAGNOSIS — E119 Type 2 diabetes mellitus without complications: Secondary | ICD-10-CM | POA: Diagnosis not present

## 2017-03-13 DIAGNOSIS — E119 Type 2 diabetes mellitus without complications: Secondary | ICD-10-CM | POA: Diagnosis not present

## 2017-03-13 DIAGNOSIS — N201 Calculus of ureter: Secondary | ICD-10-CM | POA: Diagnosis not present

## 2017-03-13 DIAGNOSIS — I10 Essential (primary) hypertension: Secondary | ICD-10-CM | POA: Diagnosis not present

## 2017-03-21 ENCOUNTER — Other Ambulatory Visit: Payer: Self-pay | Admitting: Orthopedic Surgery

## 2017-03-30 ENCOUNTER — Other Ambulatory Visit: Payer: Self-pay | Admitting: Allergy and Immunology

## 2017-04-15 ENCOUNTER — Other Ambulatory Visit: Payer: Self-pay | Admitting: Allergy and Immunology

## 2017-04-29 ENCOUNTER — Other Ambulatory Visit: Payer: Self-pay | Admitting: Allergy and Immunology

## 2017-04-29 NOTE — Telephone Encounter (Signed)
Courtesy refill  

## 2017-05-01 ENCOUNTER — Ambulatory Visit: Payer: BC Managed Care – PPO | Admitting: Allergy and Immunology

## 2017-05-01 ENCOUNTER — Encounter: Payer: Self-pay | Admitting: Allergy and Immunology

## 2017-05-01 VITALS — BP 130/74 | HR 84 | Resp 20

## 2017-05-01 DIAGNOSIS — J3089 Other allergic rhinitis: Secondary | ICD-10-CM | POA: Diagnosis not present

## 2017-05-01 DIAGNOSIS — K219 Gastro-esophageal reflux disease without esophagitis: Secondary | ICD-10-CM | POA: Diagnosis not present

## 2017-05-01 DIAGNOSIS — J4520 Mild intermittent asthma, uncomplicated: Secondary | ICD-10-CM | POA: Diagnosis not present

## 2017-05-01 NOTE — Patient Instructions (Signed)
  1. Continue omeprazole 40 mg in the morning and ranitidine 300 mg in the evening  2. Continue Nasacort 1-2 sprays each nostril daily  3. Continue use montelukast 10 mg daily  4. Can use antihistamine and Proair HFA if needed  5. Return to clinic in 6 months or earlier if problem

## 2017-05-01 NOTE — Progress Notes (Signed)
Follow-up Note  Referring Provider: Abner Greenspan, MD Primary Provider: Abner Greenspan, MD Date of Office Visit: 05/01/2017  Subjective:   Jade Perez (DOB: 12/21/1955) is a 62 y.o. female who [facility] in re-evaluation of the following:  HPI: Jade Perez returns to this clinic in reevaluation of asthma and allergic rhinitis and LPR.  Her last visit to this clinic was 24 October 2016.  She has really done well with her respiratory tract and has not required a systemic steroid or an antibiotic to treat any type of respiratory tract issue.  She consistently uses a nasal steroid. Rarely does she use a short acting bronchodilator and there has not been any impediment in performing exercise because of her asthma.  Her reflux is under excellent control.  She did Perez control of her reflux when she had a kidney stone develop in December 2018 and was vomiting and could not utilize her omeprazole or ranitidine.  Other than that event while consistently using a combination of medications she does very well with her reflux.  She did obtain a flu vaccine this year.  Allergies as of 05/01/2017      Reactions   Amoxicillin    Hives   Aspirin    unknown   Other    Bee Stings   Percocet [oxycodone-acetaminophen]    itching   Sulfa Antibiotics    Hives   Tape    & bandaids - rash/blisters under    Latex Rash      Medication List      albuterol 108 (90 Base) MCG/ACT inhaler Commonly known as:  PROAIR HFA Inhale 2 puffs into the lungs every 4 (four) hours as needed for wheezing or shortness of breath.   atorvastatin 40 MG tablet Commonly known as:  LIPITOR Take 40 mg by mouth daily.   b complex vitamins tablet Take 1 tablet by mouth daily.   CALCIUM PO Take 1,200 mg by mouth 2 (two) times daily.   cetirizine 10 MG tablet Commonly known as:  ZYRTEC Take 10 mg by mouth daily.   cholecalciferol 1000 units tablet Commonly known as:  VITAMIN  D Take 1,000 Units by mouth daily.   citalopram 40 MG tablet Commonly known as:  CELEXA Take 40 mg by mouth daily.   FARXIGA 10 MG Tabs tablet Generic drug:  dapagliflozin propanediol Take 10 mg by mouth daily.   fenofibrate 160 MG tablet Take 160 mg by mouth at bedtime.   FIBER PO Take 0.52 g by mouth daily.   FISH OIL PO Take 2,400 mg by mouth 2 (two) times daily.   glimepiride 1 MG tablet Commonly known as:  AMARYL Take 1 mg by mouth 2 (two) times daily.   losartan 25 MG tablet Commonly known as:  COZAAR Take 25 mg by mouth daily.   montelukast 10 MG tablet Commonly known as:  SINGULAIR TAKE 1 TABLET BY MOUTH EVERY DAY   multivitamin with minerals Tabs tablet Take 1 tablet by mouth daily.   NASACORT ALLERGY 24HR 55 MCG/ACT Aero nasal inhaler Generic drug:  triamcinolone Place 2 sprays into the nose daily.   omeprazole 40 MG capsule Commonly known as:  PRILOSEC TAKE ONE CAPSULE EVERY MORNING BEFORE BREAKFAST   ranitidine 300 MG tablet Commonly known as:  ZANTAC TAKE 1 TABLET BY MOUTH EVERY EVENING AS DIRECTED FOR REFLUX   SLOW RELEASE IRON 45 MG Tbcr Generic drug:  Ferrous Sulfate Dried Take 1  tablet by mouth daily.   topiramate 25 MG capsule Commonly known as:  TOPAMAX Take 25 mg by mouth 2 (two) times daily.   TRADJENTA 5 MG Tabs tablet Generic drug:  linagliptin Take 5 mg by mouth daily.   VICTOZA 18 MG/3ML Soln injection Generic drug:  Liraglutide Inject 1.8 mg into the skin daily.   Vitamin D (Ergocalciferol) 50000 units Caps capsule Commonly known as:  DRISDOL Take 50,000 Units by mouth. TAKE ONE TABLET TWICE A MONTH.   WELCHOL 625 MG tablet Generic drug:  colesevelam Take 625 mg by mouth daily.       Past Medical History:  Diagnosis Date  . Anemia   . Arthritis   . Complication of anesthesia    difficulty waking up  . Diabetes mellitus   . GERD (gastroesophageal reflux disease)   . High cholesterol   . Hypertension   .  Kidney stone SEPT. 2016, DEC. 2018  . Sleep apnea    sleep study in [place], has but does not use CPAP, Dr. Yetta Flock has results [facility]    Past Surgical History:  Procedure Laterality Date  . ABDOMINAL EXPLORATION SURGERY    . APPENDECTOMY    . BREAST EXCISIONAL BIOPSY Left    benign  . LIPOMA EXCISION    . LITHOTRIPSY  03/2017  . SCLERAL BUCKLE  10/15/2011   Procedure: SCLERAL BUCKLE;  Surgeon: Sherrie George, MD;  Location: [facility] OR;  Service: Ophthalmology;  Laterality: Right;    Review of systems negative except as noted in HPI / PMHx or noted below:  Review of Systems  Constitutional: Negative.   HENT: Negative.   Eyes: Negative.   Respiratory: Negative.   Cardiovascular: Negative.   Gastrointestinal: Negative.   Genitourinary: Negative.   Musculoskeletal: Negative.   Skin: Negative.   Neurological: Negative.   Endo/Heme/Allergies: Negative.   Psychiatric/Behavioral: Negative.      Objective:   Vitals:   05/01/17 1150  BP: 130/74  Pulse: 84  Resp: 20          Physical Exam  Constitutional: She is well-developed, well-nourished, and in no distress.  HENT:  Head: Normocephalic.  Right Ear: Tympanic membrane, external ear and ear canal normal.  Left Ear: Tympanic membrane, external ear and ear canal normal.  Nose: Nose normal. No mucosal edema or rhinorrhea.  Mouth/Throat: Uvula is midline, oropharynx is clear and moist and mucous membranes are normal. No oropharyngeal exudate.  Eyes: Conjunctivae are normal.  Neck: Trachea normal. No tracheal tenderness present. No tracheal deviation present. No thyromegaly present.  Cardiovascular: Normal rate, regular rhythm, S1 normal, S2 normal and normal heart sounds.  No murmur heard. Pulmonary/Chest: Breath sounds normal. No stridor. No respiratory distress. She has no wheezes. She has no rales.  Musculoskeletal: She exhibits no edema.  Lymphadenopathy:       Head (right side): No tonsillar adenopathy  present.       Head (left side): No tonsillar adenopathy present.    She has no cervical adenopathy.  Neurological: She is alert. Gait normal.  Skin: No rash noted. She is not diaphoretic. No erythema. Nails show no clubbing.  Psychiatric: Mood and affect normal.    Diagnostics:    Spirometry was performed and demonstrated an FEV1 of 2.46 at 109 % of predicted.  The patient had an Asthma Control Test with the following results: ACT Total Score: 25.    Assessment and Plan:   1. Asthma, mild intermittent, well-controlled   2. Other  allergic rhinitis   3. LPRD (laryngopharyngeal reflux disease)     1. Continue omeprazole 40 mg in the morning and ranitidine 300 mg in the evening  2. Continue Nasacort 1-2 sprays each nostril daily  3. Continue use montelukast 10 mg daily  4. Can use antihistamine and Proair HFA if needed  5. Return to clinic in 6 months or earlier if problem   Jade Loselaine really appears to be doing very well on her current medical plan as specified above and she will continue to use a combination of therapy to address her reflux as well as a plan of anti-inflammatory medication for her respiratory tract.  I will see her back in this clinic in 6 months or earlier if there is a problem.  Laurette SchimkeEric Raileigh Sabater, MD Allergy / Immunology [facility]

## 2017-05-04 ENCOUNTER — Encounter: Payer: Self-pay | Admitting: Allergy and Immunology

## 2017-05-15 ENCOUNTER — Other Ambulatory Visit: Payer: Self-pay

## 2017-05-15 ENCOUNTER — Encounter (HOSPITAL_BASED_OUTPATIENT_CLINIC_OR_DEPARTMENT_OTHER): Payer: Self-pay | Admitting: *Deleted

## 2017-05-15 NOTE — Progress Notes (Signed)
[facility] [place] will fax over labs pt had drawn last week.

## 2017-05-19 ENCOUNTER — Encounter (HOSPITAL_BASED_OUTPATIENT_CLINIC_OR_DEPARTMENT_OTHER)
Admission: RE | Admit: 2017-05-19 | Discharge: 2017-05-19 | Disposition: A | Discharge status: Home / Self Care | Payer: BC Managed Care – PPO | Source: Ambulatory Visit | Attending: Orthopedic Surgery | Admitting: Orthopedic Surgery

## 2017-05-19 ENCOUNTER — Other Ambulatory Visit: Payer: Self-pay

## 2017-05-19 ENCOUNTER — Encounter (HOSPITAL_BASED_OUTPATIENT_CLINIC_OR_DEPARTMENT_OTHER): Payer: Self-pay | Admitting: *Deleted

## 2017-05-19 DIAGNOSIS — R011 Cardiac murmur, unspecified: Secondary | ICD-10-CM | POA: Diagnosis not present

## 2017-05-19 DIAGNOSIS — Z888 Allergy status to other drugs, medicaments and biological substances status: Secondary | ICD-10-CM | POA: Diagnosis not present

## 2017-05-19 DIAGNOSIS — M109 Gout, unspecified: Secondary | ICD-10-CM | POA: Diagnosis not present

## 2017-05-19 DIAGNOSIS — E7800 Pure hypercholesterolemia, unspecified: Secondary | ICD-10-CM | POA: Diagnosis not present

## 2017-05-19 DIAGNOSIS — Z882 Allergy status to sulfonamides status: Secondary | ICD-10-CM | POA: Diagnosis not present

## 2017-05-19 DIAGNOSIS — Z880 Allergy status to penicillin: Secondary | ICD-10-CM | POA: Diagnosis not present

## 2017-05-19 DIAGNOSIS — F419 Anxiety disorder, unspecified: Secondary | ICD-10-CM | POA: Diagnosis not present

## 2017-05-19 DIAGNOSIS — Z885 Allergy status to narcotic agent status: Secondary | ICD-10-CM | POA: Diagnosis not present

## 2017-05-19 DIAGNOSIS — K219 Gastro-esophageal reflux disease without esophagitis: Secondary | ICD-10-CM | POA: Diagnosis not present

## 2017-05-19 DIAGNOSIS — M19049 Primary osteoarthritis, unspecified hand: Secondary | ICD-10-CM | POA: Diagnosis not present

## 2017-05-19 DIAGNOSIS — E119 Type 2 diabetes mellitus without complications: Secondary | ICD-10-CM | POA: Diagnosis not present

## 2017-05-19 DIAGNOSIS — M1710 Unilateral primary osteoarthritis, unspecified knee: Secondary | ICD-10-CM | POA: Diagnosis not present

## 2017-05-19 DIAGNOSIS — M1611 Unilateral primary osteoarthritis, right hip: Secondary | ICD-10-CM | POA: Diagnosis not present

## 2017-05-19 DIAGNOSIS — G5603 Carpal tunnel syndrome, bilateral upper limbs: Secondary | ICD-10-CM | POA: Diagnosis present

## 2017-05-19 DIAGNOSIS — Z91040 Latex allergy status: Secondary | ICD-10-CM | POA: Diagnosis not present

## 2017-05-19 DIAGNOSIS — Z886 Allergy status to analgesic agent status: Secondary | ICD-10-CM | POA: Diagnosis not present

## 2017-05-19 DIAGNOSIS — J45909 Unspecified asthma, uncomplicated: Secondary | ICD-10-CM | POA: Diagnosis not present

## 2017-05-19 DIAGNOSIS — F329 Major depressive disorder, single episode, unspecified: Secondary | ICD-10-CM | POA: Diagnosis not present

## 2017-05-19 DIAGNOSIS — G4730 Sleep apnea, unspecified: Secondary | ICD-10-CM | POA: Diagnosis not present

## 2017-05-19 DIAGNOSIS — I10 Essential (primary) hypertension: Secondary | ICD-10-CM | POA: Diagnosis not present

## 2017-05-19 LAB — BASIC METABOLIC PANEL
ANION GAP: 14 (ref 5–15)
BUN: 15 mg/dL (ref 6–20)
CHLORIDE: 103 mmol/L (ref 101–111)
CO2: 20 mmol/L — AB (ref 22–32)
CREATININE: 1.07 mg/dL — AB (ref 0.44–1.00)
Calcium: 9.2 mg/dL (ref 8.9–10.3)
GFR calc non Af Amer: 54 mL/min — ABNORMAL LOW (ref >60)
Glucose, Bld: 192 mg/dL — ABNORMAL HIGH (ref 65–99)
POTASSIUM: 3.7 mmol/L (ref 3.5–5.1)
SODIUM: 137 mmol/L (ref 135–145)

## 2017-05-19 NOTE — Progress Notes (Signed)
Dr. Casilda Carlsob Fitzgerald reviewed EKG and ok for surgery.

## 2017-05-20 ENCOUNTER — Encounter (HOSPITAL_BASED_OUTPATIENT_CLINIC_OR_DEPARTMENT_OTHER): Payer: Self-pay | Admitting: *Deleted

## 2017-05-21 NOTE — Anesthesia Preprocedure Evaluation (Signed)
Anesthesia Evaluation  Patient identified by MRN, date of birth, ID band Patient awake    Reviewed: Allergy & Precautions, H&P , NPO status , Patient's Chart, lab work & pertinent test results, reviewed documented beta blocker date and time   History of Anesthesia Complications (+) PROLONGED EMERGENCE  Airway Mallampati: I  TM Distance: <3 FB Neck ROM: Full    Dental  (+) Teeth Intact, Dental Advisory Given   Pulmonary sleep apnea and Continuous Positive Airway Pressure Ventilation ,    breath sounds clear to auscultation       Cardiovascular hypertension, Pt. on medications  Rhythm:Regular Rate:Normal     Neuro/Psych    GI/Hepatic GERD  Medicated and Controlled,  Endo/Other  diabetes, Well Controlled, Type 1, Oral Hypoglycemic Agents  Renal/GU      Musculoskeletal   Abdominal (+)  Abdomen: soft. Bowel sounds: normal.  Peds  Hematology   Anesthesia Other Findings   Reproductive/Obstetrics                             Anesthesia Physical  Anesthesia Plan  ASA: III  Anesthesia Plan: MAC and Bier Block and Bier Block-LIDOCAINE ONLY   Post-op Pain Management:    Induction: Intravenous  PONV Risk Score and Plan: 2 and Treatment may vary due to age or medical condition  Airway Management Planned: Nasal Cannula and Natural Airway  Additional Equipment:   Intra-op Plan:   Post-operative Plan:   Informed Consent: I have reviewed the patients History and Physical, chart, labs and discussed the procedure including the risks, benefits and alternatives for the proposed anesthesia with the patient or authorized representative who has indicated his/her understanding and acceptance.   Dental advisory given  Plan Discussed with: CRNA, Anesthesiologist and Surgeon  Anesthesia Plan Comments:         Anesthesia Quick Evaluation

## 2017-05-22 ENCOUNTER — Ambulatory Visit (HOSPITAL_BASED_OUTPATIENT_CLINIC_OR_DEPARTMENT_OTHER): Payer: BC Managed Care – PPO | Admitting: Anesthesiology

## 2017-05-22 ENCOUNTER — Ambulatory Visit (HOSPITAL_BASED_OUTPATIENT_CLINIC_OR_DEPARTMENT_OTHER)
Admission: RE | Admit: 2017-05-22 | Discharge: 2017-05-22 | Disposition: A | Discharge status: Home / Self Care | Payer: BC Managed Care – PPO | Source: Ambulatory Visit | Attending: Orthopedic Surgery | Admitting: Orthopedic Surgery

## 2017-05-22 ENCOUNTER — Encounter (HOSPITAL_BASED_OUTPATIENT_CLINIC_OR_DEPARTMENT_OTHER)
Admission: RE | Disposition: A | Discharge status: Home / Self Care | Payer: Self-pay | Source: Ambulatory Visit | Attending: Orthopedic Surgery

## 2017-05-22 DIAGNOSIS — G4730 Sleep apnea, unspecified: Secondary | ICD-10-CM | POA: Insufficient documentation

## 2017-05-22 DIAGNOSIS — E119 Type 2 diabetes mellitus without complications: Secondary | ICD-10-CM | POA: Insufficient documentation

## 2017-05-22 DIAGNOSIS — Z888 Allergy status to other drugs, medicaments and biological substances status: Secondary | ICD-10-CM | POA: Insufficient documentation

## 2017-05-22 DIAGNOSIS — R011 Cardiac murmur, unspecified: Secondary | ICD-10-CM | POA: Insufficient documentation

## 2017-05-22 DIAGNOSIS — Z882 Allergy status to sulfonamides status: Secondary | ICD-10-CM | POA: Insufficient documentation

## 2017-05-22 DIAGNOSIS — G5603 Carpal tunnel syndrome, bilateral upper limbs: Secondary | ICD-10-CM | POA: Insufficient documentation

## 2017-05-22 DIAGNOSIS — Z880 Allergy status to penicillin: Secondary | ICD-10-CM | POA: Insufficient documentation

## 2017-05-22 DIAGNOSIS — M1710 Unilateral primary osteoarthritis, unspecified knee: Secondary | ICD-10-CM | POA: Insufficient documentation

## 2017-05-22 DIAGNOSIS — F329 Major depressive disorder, single episode, unspecified: Secondary | ICD-10-CM | POA: Insufficient documentation

## 2017-05-22 DIAGNOSIS — K219 Gastro-esophageal reflux disease without esophagitis: Secondary | ICD-10-CM | POA: Insufficient documentation

## 2017-05-22 DIAGNOSIS — Z886 Allergy status to analgesic agent status: Secondary | ICD-10-CM | POA: Insufficient documentation

## 2017-05-22 DIAGNOSIS — M109 Gout, unspecified: Secondary | ICD-10-CM | POA: Insufficient documentation

## 2017-05-22 DIAGNOSIS — I10 Essential (primary) hypertension: Secondary | ICD-10-CM | POA: Insufficient documentation

## 2017-05-22 DIAGNOSIS — E7800 Pure hypercholesterolemia, unspecified: Secondary | ICD-10-CM | POA: Insufficient documentation

## 2017-05-22 DIAGNOSIS — Z885 Allergy status to narcotic agent status: Secondary | ICD-10-CM | POA: Insufficient documentation

## 2017-05-22 DIAGNOSIS — M19049 Primary osteoarthritis, unspecified hand: Secondary | ICD-10-CM | POA: Insufficient documentation

## 2017-05-22 DIAGNOSIS — F419 Anxiety disorder, unspecified: Secondary | ICD-10-CM | POA: Insufficient documentation

## 2017-05-22 DIAGNOSIS — J45909 Unspecified asthma, uncomplicated: Secondary | ICD-10-CM | POA: Insufficient documentation

## 2017-05-22 DIAGNOSIS — Z91040 Latex allergy status: Secondary | ICD-10-CM | POA: Insufficient documentation

## 2017-05-22 DIAGNOSIS — M1611 Unilateral primary osteoarthritis, right hip: Secondary | ICD-10-CM | POA: Insufficient documentation

## 2017-05-22 HISTORY — DX: Personal history of other diseases of the digestive system: Z87.19

## 2017-05-22 HISTORY — PX: CARPAL TUNNEL RELEASE: SHX101

## 2017-05-22 HISTORY — DX: Type 2 diabetes mellitus without complications: E11.9

## 2017-05-22 HISTORY — DX: Unspecified asthma, uncomplicated: J45.909

## 2017-05-22 HISTORY — DX: Major depressive disorder, single episode, unspecified: F32.9

## 2017-05-22 HISTORY — DX: Depression, unspecified: F32.A

## 2017-05-22 HISTORY — DX: Personal history of urinary calculi: Z87.442

## 2017-05-22 HISTORY — DX: Cardiac murmur, unspecified: R01.1

## 2017-05-22 HISTORY — DX: Anxiety disorder, unspecified: F41.9

## 2017-05-22 LAB — GLUCOSE, CAPILLARY
Glucose-Capillary: 139 mg/dL — ABNORMAL HIGH (ref 65–99)
Glucose-Capillary: 147 mg/dL — ABNORMAL HIGH (ref 65–99)

## 2017-05-22 SURGERY — CARPAL TUNNEL RELEASE
Anesthesia: Monitor Anesthesia Care | Site: Wrist | Laterality: Right

## 2017-05-22 MED ORDER — ONDANSETRON HCL 4 MG/2ML IJ SOLN
INTRAMUSCULAR | Status: DC | PRN
  Administered 2017-05-22: 4 mg via INTRAVENOUS

## 2017-05-22 MED ORDER — CEFAZOLIN SODIUM-DEXTROSE 2-4 GM/100ML-% IV SOLN
INTRAVENOUS | Status: AC
  Filled 2017-05-22: qty 100

## 2017-05-22 MED ORDER — FENTANYL CITRATE (PF) 100 MCG/2ML IJ SOLN
50.0000 ug | INTRAMUSCULAR | Status: DC | PRN
  Administered 2017-05-22: 50 ug via INTRAVENOUS

## 2017-05-22 MED ORDER — KETOROLAC TROMETHAMINE 30 MG/ML IJ SOLN: 30.0000 mg | Freq: Once | INTRAMUSCULAR | Status: DC | PRN

## 2017-05-22 MED ORDER — MIDAZOLAM HCL 2 MG/2ML IJ SOLN
INTRAMUSCULAR | Status: AC
  Filled 2017-05-22: qty 2

## 2017-05-22 MED ORDER — MIDAZOLAM HCL 2 MG/2ML IJ SOLN
1.0000 mg | INTRAMUSCULAR | Status: DC | PRN
  Administered 2017-05-22 (×2): 1 mg via INTRAVENOUS

## 2017-05-22 MED ORDER — FENTANYL CITRATE (PF) 100 MCG/2ML IJ SOLN
INTRAMUSCULAR | Status: AC
  Filled 2017-05-22: qty 2

## 2017-05-22 MED ORDER — LACTATED RINGERS IV SOLN: INTRAVENOUS | Status: DC

## 2017-05-22 MED ORDER — CEFAZOLIN SODIUM-DEXTROSE 2-4 GM/100ML-% IV SOLN
2.0000 g | INTRAVENOUS | Status: AC
  Administered 2017-05-22: 2 g via INTRAVENOUS

## 2017-05-22 MED ORDER — LIDOCAINE HCL (PF) 0.5 % IJ SOLN
INTRAMUSCULAR | Status: DC | PRN
  Administered 2017-05-22: 30 mL via INTRAVENOUS

## 2017-05-22 MED ORDER — OXYCODONE HCL 5 MG/5ML PO SOLN: 5.0000 mg | Freq: Once | ORAL | Status: DC | PRN

## 2017-05-22 MED ORDER — CHLORHEXIDINE GLUCONATE 4 % EX LIQD: 60.0000 mL | Freq: Once | CUTANEOUS | Status: DC

## 2017-05-22 MED ORDER — HYDROCODONE-ACETAMINOPHEN 5-325 MG PO TABS: 1.0000 | ORAL_TABLET | Freq: Four times a day (QID) | ORAL | 0 refills | Status: DC | PRN

## 2017-05-22 MED ORDER — ACETAMINOPHEN 325 MG PO TABS: 325.0000 mg | ORAL_TABLET | ORAL | Status: DC | PRN

## 2017-05-22 MED ORDER — ACETAMINOPHEN 160 MG/5ML PO SOLN: 325.0000 mg | ORAL | Status: DC | PRN

## 2017-05-22 MED ORDER — ONDANSETRON HCL 4 MG/2ML IJ SOLN: 4.0000 mg | Freq: Once | INTRAMUSCULAR | Status: DC | PRN

## 2017-05-22 MED ORDER — ONDANSETRON HCL 4 MG/2ML IJ SOLN
INTRAMUSCULAR | Status: AC
  Filled 2017-05-22: qty 2

## 2017-05-22 MED ORDER — FENTANYL CITRATE (PF) 100 MCG/2ML IJ SOLN
25.0000 ug | INTRAMUSCULAR | Status: DC | PRN
Start: 2017-05-22 — End: 2017-05-22

## 2017-05-22 MED ORDER — OXYCODONE HCL 5 MG PO TABS: 5.0000 mg | ORAL_TABLET | Freq: Once | ORAL | Status: DC | PRN

## 2017-05-22 MED ORDER — BUPIVACAINE HCL (PF) 0.25 % IJ SOLN
INTRAMUSCULAR | Status: DC | PRN
  Administered 2017-05-22: 8 mL

## 2017-05-22 MED ORDER — SCOPOLAMINE 1 MG/3DAYS TD PT72: 1.0000 | MEDICATED_PATCH | Freq: Once | TRANSDERMAL | Status: DC | PRN

## 2017-05-22 MED ORDER — MEPERIDINE HCL 25 MG/ML IJ SOLN: 6.2500 mg | INTRAMUSCULAR | Status: DC | PRN

## 2017-05-22 MED ORDER — PROPOFOL 500 MG/50ML IV EMUL
INTRAVENOUS | Status: DC | PRN
  Administered 2017-05-22: 100 ug/kg/min via INTRAVENOUS

## 2017-05-22 MED ORDER — BUPIVACAINE HCL (PF) 0.25 % IJ SOLN
INTRAMUSCULAR | Status: AC
  Filled 2017-05-22: qty 90

## 2017-05-22 MED ORDER — PROPOFOL 500 MG/50ML IV EMUL
INTRAVENOUS | Status: AC
  Filled 2017-05-22: qty 50

## 2017-05-22 SURGICAL SUPPLY — 40 items
BLADE SURG 15 STRL LF DISP TIS (BLADE) ×1 IMPLANT
BLADE SURG 15 STRL SS (BLADE) ×3
BNDG CMPR 9X4 STRL LF SNTH (GAUZE/BANDAGES/DRESSINGS)
BNDG COHESIVE 3X5 TAN STRL LF (GAUZE/BANDAGES/DRESSINGS) ×3 IMPLANT
BNDG ESMARK 4X9 LF (GAUZE/BANDAGES/DRESSINGS) IMPLANT
BNDG GAUZE ELAST 4 BULKY (GAUZE/BANDAGES/DRESSINGS) ×3 IMPLANT
CHLORAPREP W/TINT 26ML (MISCELLANEOUS) ×3 IMPLANT
CORD BIPOLAR FORCEPS 12FT (ELECTRODE) ×3 IMPLANT
COVER BACK TABLE 60X90IN (DRAPES) ×3 IMPLANT
COVER MAYO STAND STRL (DRAPES) ×3 IMPLANT
CUFF TOURNIQUET SINGLE 18IN (TOURNIQUET CUFF) ×3 IMPLANT
DRAPE EXTREMITY T 121X128X90 (DRAPE) ×3 IMPLANT
DRAPE SURG 17X23 STRL (DRAPES) ×3 IMPLANT
DRSG PAD ABDOMINAL 8X10 ST (GAUZE/BANDAGES/DRESSINGS) ×3 IMPLANT
GAUZE SPONGE 4X4 12PLY STRL (GAUZE/BANDAGES/DRESSINGS) ×3 IMPLANT
GAUZE XEROFORM 1X8 LF (GAUZE/BANDAGES/DRESSINGS) ×3 IMPLANT
GLOVE BIOGEL PI IND STRL 7.0 (GLOVE) IMPLANT
GLOVE BIOGEL PI IND STRL 7.5 (GLOVE) ×1 IMPLANT
GLOVE BIOGEL PI IND STRL 8.5 (GLOVE) ×1 IMPLANT
GLOVE BIOGEL PI INDICATOR 7.0 (GLOVE) ×2
GLOVE BIOGEL PI INDICATOR 7.5 (GLOVE) ×2
GLOVE BIOGEL PI INDICATOR 8.5 (GLOVE) ×2
GLOVE SURG ORTHO 8.0 STRL STRW (GLOVE) IMPLANT
GLOVE SURG SS PI 6.5 STRL IVOR (GLOVE) ×3 IMPLANT
GLOVE SURG SS PI 7.5 STRL IVOR (GLOVE) ×3 IMPLANT
GLOVE SURG SS PI 8.0 STRL IVOR (GLOVE) ×3 IMPLANT
GOWN STRL REUS W/ TWL LRG LVL3 (GOWN DISPOSABLE) ×1 IMPLANT
GOWN STRL REUS W/TWL LRG LVL3 (GOWN DISPOSABLE) ×3
GOWN STRL REUS W/TWL XL LVL3 (GOWN DISPOSABLE) ×3 IMPLANT
NDL PRECISIONGLIDE 27X1.5 (NEEDLE) IMPLANT
NEEDLE PRECISIONGLIDE 27X1.5 (NEEDLE) ×3 IMPLANT
NS IRRIG 1000ML POUR BTL (IV SOLUTION) ×3 IMPLANT
PACK BASIN DAY SURGERY FS (CUSTOM PROCEDURE TRAY) ×3 IMPLANT
STOCKINETTE 4X48 STRL (DRAPES) ×3 IMPLANT
SUT ETHILON 4 0 PS 2 18 (SUTURE) ×3 IMPLANT
SUT VICRYL 4-0 PS2 18IN ABS (SUTURE) IMPLANT
SYR BULB 3OZ (MISCELLANEOUS) ×3 IMPLANT
SYR CONTROL 10ML LL (SYRINGE) ×3 IMPLANT
TOWEL OR 17X24 6PK STRL BLUE (TOWEL DISPOSABLE) ×3 IMPLANT
UNDERPAD 30X30 (UNDERPADS AND DIAPERS) ×3 IMPLANT

## 2017-05-22 NOTE — Anesthesia Postprocedure Evaluation (Signed)
Anesthesia Post Note  Patient: Jade Perez  Procedure(s) Performed: RIGHT CARPAL TUNNEL RELEASE (Right Wrist)     Patient location during evaluation: PACU Anesthesia Type: MAC Level of consciousness: awake and alert Pain management: pain level controlled Vital Signs Assessment: post-procedure vital signs reviewed and stable Respiratory status: spontaneous breathing, nonlabored ventilation, respiratory function stable and patient connected to nasal cannula oxygen Cardiovascular status: stable and blood pressure returned to baseline Postop Assessment: no apparent nausea or vomiting Anesthetic complications: no    Last Vitals:  Vitals:   05/22/17 0915 05/22/17 0933  BP: 129/72 139/71  Pulse: 72 75  Resp: 16 18  Temp:  36.4 C  SpO2: 95% 95%    Last Pain:  Vitals:   05/22/17 0933  PainSc: 0-No pain                 Ailen Strauch

## 2017-05-22 NOTE — Op Note (Signed)
Dictation Number [id number]

## 2017-05-22 NOTE — H&P (Signed)
Jade Perez is an 62 y.o. female.   Chief Complaint:numbness handsHPI: Jade DandyMary is a 62 year old right-hand-dominant female referred by Dr. Yetta FlockHodges for consultation regarding numbness tingling pain in her hands bilaterally right equal to left. Her major complaint is numbness and tingling to all of her fingers. She states is been going on for 5 years increasing over the past year and a half. She recalls no history of injury to her hands. She did stay in a fall 20 years ago from a wagon. She sees a Landchiropractor for her neck. She is occasionally awakened at night but it does not know for certain if his numbness and tingling or discomfort causing her waking up. 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 She is complaining of an aching pain in her hand on the radial aspect. She has a history of diabetes and gout. No history of thyroid problems or arthritis. Family history is positive diabetes thyroid problems arthritis no history of gout.  She was referred to Dr. Riccardo DubinKarvelas for nerve conductions. Nerve conductions are reviewed with her. 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 She has a motor latency of 6.8 on the left 7.1 on the right there is no response to sensory stimulation on her right side with a sensory delay of 5.7 on her left              Past Medical History:  Diagnosis Date  . Anemia   . Anemia    anemic in 2010  . Anxiety   . Arthritis   . Arthritis    thumb and fingers, knee, right hip  . Asthma    takes montelukast  . Complication of anesthesia    difficulty waking up  . Complication of anesthesia    difficult to wake up   . Depression   . Diabetes mellitus   . Diabetes mellitus without complication (HCC)    type 2, diagnosed 2008  . GERD (gastroesophageal reflux disease)   . GERD (gastroesophageal reflux disease)    omeprazole and ranitidine  . Heart murmur    since birth very slight  . High cholesterol   . History of hiatal  hernia    small  . History of kidney stones    december 3- January 7th  . Hypertension   . Kidney stone SEPT. 2016, DEC. 2018  . Sleep apnea    sleep study in [place], has but does not use CPAP, Dr. Yetta [facility]Hodges has results [facility]  . Sleep apnea    does not wear cpap, doesn't know settings    Past Surgical History:  Procedure Laterality Date  . ABDOMINAL EXPLORATION SURGERY    . APPENDECTOMY    . BREAST EXCISIONAL BIOPSY Left    benign  . cyst removed     right breast two  . DILATION AND CURETTAGE OF UTERUS    . EYE SURGERY Left    laser surgery on retina for tear-done in office  . LIPOMA EXCISION    . LIPOMA EXCISION     right shoulder  . LITHOTRIPSY  03/2017  . RETINAL DETACHMENT SURGERY Right   . SCLERAL BUCKLE  10/15/2011   Procedure: SCLERAL BUCKLE;  Surgeon: Sherrie GeorgeJohn D Matthews, MD;  [facility];  Service: Ophthalmology;  Laterality: Right;  . SCLERAL BUCKLE PROCEDURE Right   . WISDOM TOOTH EXTRACTION      Family History  Problem Relation Age of Onset  . Allergic rhinitis Neg Hx   . Angioedema  Neg Hx   . Asthma Neg Hx   . Atopy Neg Hx   . Eczema Neg Hx   . Immunodeficiency Neg Hx   . Urticaria Neg Hx    Social History:  reports that  has never smoked. she has never used smokeless tobacco. She reports that she does not drink alcohol or use drugs.  Allergies:  Allergies  Allergen Reactions  . Amoxicillin     Hives   . Aspirin     unknown  . Biaxin [Clarithromycin]   . Other     Bee Stings  . Percocet [Oxycodone-Acetaminophen]     itching  . Sulfa Antibiotics     Hives   . Tape     & bandaids - rash/blisters under   . Latex Rash    No medications prior to admission.    No results found for this or any previous visit (from the past 48 hour(s)).  No results found.   Pertinent items are noted in HPI.  Height 5\' 2"  (1.575 m), weight 88.5 kg (195 lb).  General appearance: alert, cooperative and appears stated age Head:  Normocephalic, without obvious abnormality Neck: no JVD Resp: clear to auscultation bilaterally Cardio: normal apical impulse GI: soft, non-tender; bowel sounds normal; no masses,  no organomegaly Extremities: numb right hand Pulses: 2+ and symmetric Skin: Skin color, texture, turgor normal. No rashes or lesions Neurologic: Grossly normal Incision/Wound: na  Assessment/Plan Assessment:   Bilateral carpal tunnel syndrome    Plan: Nerve conductions are discussed with her and her husband. We have discussed possibility of surgical decompression of the nerves. Pre-peri-and postoperative course been discussed along with risks and complications. She is aware there is no guarantee to the surgery the possibility of infection recurrence injury to arteries nerves tendons incomplete release symptoms dystrophy. She would like to proceed. She will be tentatively scheduled at her convenience for carpal tunnel release on the right hand .      Tyreik Delahoussaye R 05/22/2017, 5:28 AM

## 2017-05-22 NOTE — Op Note (Signed)
NAMElita Perez:  ,                            ACCOUNT NO.:  [id number]  MEDICAL RECORD NO.:  [id number]  LOCATION:                                 FACILITY:  PHYSICIAN:  Cindee SaltGary Zahki Hoogendoorn, M.D.            DATE OF BIRTH:  DATE OF PROCEDURE:  05/22/2017 DATE OF DISCHARGE:                              OPERATIVE REPORT   PREOPERATIVE DIAGNOSIS:  Carpal tunnel syndrome, right hand.  POSTOPERATIVE DIAGNOSIS:  Carpal tunnel syndrome, right hand.  OPERATION:  Decompression right median nerve.  SURGEON:  Cindee SaltGary Chekesha Behlke, MD.  ASSISTANT:  None.  ANESTHESIA:  Forearm IV regional with local infiltration.  [facility]  HISTORY:  The patient is a 62 year old female with a history of bilateral carpal tunnel syndrome.  EMG nerve conduction is positive. This has not responded to conservative treatment.  She has elected to undergo surgical decompression of the median nerve, right hand.  Pre, peri, and postoperative course have been discussed along with risks and complications.  She is aware there is no guarantee to the surgery; the possibility of infection; recurrence of injury to arteries, nerves, tendons; incomplete relief of symptoms; dystrophy.  In preoperative area, the patient is seen, the extremity marked by both patient and surgeon.  Antibiotic given.  DESCRIPTION OF PROCEDURE:  The patient was brought to the operating room, where a forearm-based IV regional anesthetic was carried out without difficulty under the direction of the Anesthesia Department. She was prepped using ChloraPrep in supine position with the right arm free.  A 3-minute dry time was allowed and time-out taken, confirming the patient and procedure.  A longitudinal incision was made in the right palm, carried down through subcutaneous tissue.  Bleeders were electrocauterized with bipolar.  The palmar fascia was split. Superficial palmar arch was identified.  Flexor tendon of the ring and little finger was  identified.  Retractors were placed retracting median nerve radially and ulnar nerve ulnarly.  Flexor retinaculum was then released on its ulnar border.  A right angle and Sewell retractor were placed between skin and forearm fascia.  Blunt dissection was used to separate this tissue.  The deep structures were also separated with blunt dissection.  A blunt scissors were then used to transect the proximal aspect of the flexor retinaculum distal forearm fascia for approximately 2 cm proximal to the wrist crease.  An exploration to the nerve was performed.  Area of compression was noted.  Motor branch entered into muscle distally.  No further lesions were identified.  The wound was irrigated with saline copiously and the wound closed with interrupted 4-0 nylon sutures.  A local infiltration with 0.25% bupivacaine without epinephrine was given, approximately 8-9 mL was used.  A sterile compressive dressing with the fingers free was applied.  On deflation of the tourniquet, all fingers immediately pinked.  She was taken to the recovery room for observation in satisfactory condition.  She will be discharged to home to [facility], on [place].  ______________________________ Cindee Salt, M.D.     GK/MEDQ  D:  05/22/2017  T:  05/22/2017  Job:  [id number]

## 2017-05-22 NOTE — Discharge Instructions (Addendum)

## 2017-05-22 NOTE — Transfer of Care (Signed)
Immediate Anesthesia Transfer of Care Note  Patient: Jade RamsayMary Elaine Perez  Procedure(s) Performed: RIGHT CARPAL TUNNEL RELEASE (Right Wrist)  Patient Location: PACU  Anesthesia Type:Bier block  Level of Consciousness: awake, alert  and patient cooperative  Airway & Oxygen Therapy: Patient Spontanous Breathing  Post-op Assessment: Report given to RN and Post -op Vital signs reviewed and stable  Post vital signs: Reviewed and stable  Last Vitals: There were no vitals filed for this visit.  Last Pain: There were no vitals filed for this visit.       Complications: No apparent anesthesia complications

## 2017-05-22 NOTE — Brief Op Note (Signed)
05/22/2017  8:55 AM  PATIENT:  Jade GistMary Elaine Perez  62 y.o. female  PRE-OPERATIVE DIAGNOSIS:  RIGHT CARPAL TUNNEL SYNDROME  POST-OPERATIVE DIAGNOSIS:  RIGHT CARPAL TUNNEL SYNDROME  PROCEDURE:  Procedure(s): RIGHT CARPAL TUNNEL RELEASE (Right)  SURGEON:  Surgeon(s) and Role:    * Cindee SaltKuzma, Teffany Blaszczyk, MD - Primary  PHYSICIAN ASSISTANT:   ASSISTANTS: none   ANESTHESIA:   local, regional and IV sedation  EBL: noneBLOOD ADMINISTERED:none  DRAINS: none   LOCAL MEDICATIONS USED:  BUPIVICAINE   SPECIMEN:  No Specimen  DISPOSITION OF SPECIMEN:  N/A  COUNTS:  YES  TOURNIQUET:   Total Tourniquet Time Documented: Forearm (Right) - 21 minutes Total: Forearm (Right) - 21 minutes   DICTATION: .Other Dictation: Dictation Number [id number]  PLAN OF CARE: Discharge to home after PACU  PATIENT DISPOSITION:  PACU - hemodynamically stable.

## 2017-05-23 ENCOUNTER — Encounter (HOSPITAL_BASED_OUTPATIENT_CLINIC_OR_DEPARTMENT_OTHER): Payer: Self-pay | Admitting: Orthopedic Surgery

## 2017-05-31 ENCOUNTER — Other Ambulatory Visit: Payer: Self-pay | Admitting: Allergy and Immunology

## 2017-06-02 ENCOUNTER — Other Ambulatory Visit: Payer: Self-pay | Admitting: Allergy and Immunology

## 2017-06-25 ENCOUNTER — Encounter: Payer: Self-pay | Admitting: Gastroenterology

## 2017-07-16 ENCOUNTER — Other Ambulatory Visit: Payer: Self-pay | Admitting: Allergy and Immunology

## 2017-10-02 ENCOUNTER — Ambulatory Visit (INDEPENDENT_AMBULATORY_CARE_PROVIDER_SITE_OTHER): Payer: BC Managed Care – PPO | Admitting: Ophthalmology

## 2017-10-02 DIAGNOSIS — H338 Other retinal detachments: Secondary | ICD-10-CM | POA: Diagnosis not present

## 2017-10-02 DIAGNOSIS — H43813 Vitreous degeneration, bilateral: Secondary | ICD-10-CM | POA: Diagnosis not present

## 2017-10-02 DIAGNOSIS — I10 Essential (primary) hypertension: Secondary | ICD-10-CM | POA: Diagnosis not present

## 2017-10-02 DIAGNOSIS — H35033 Hypertensive retinopathy, bilateral: Secondary | ICD-10-CM

## 2017-10-04 ENCOUNTER — Other Ambulatory Visit: Payer: Self-pay | Admitting: Allergy and Immunology

## 2017-10-12 ENCOUNTER — Other Ambulatory Visit: Payer: Self-pay | Admitting: Allergy and Immunology

## 2017-10-19 ENCOUNTER — Other Ambulatory Visit: Payer: Self-pay | Admitting: Allergy and Immunology

## 2017-10-20 ENCOUNTER — Encounter: Payer: Self-pay | Admitting: Allergy and Immunology

## 2017-10-20 ENCOUNTER — Ambulatory Visit: Payer: BC Managed Care – PPO | Admitting: Allergy and Immunology

## 2017-10-20 VITALS — BP 112/76 | HR 68 | Resp 20

## 2017-10-20 DIAGNOSIS — J3089 Other allergic rhinitis: Secondary | ICD-10-CM | POA: Diagnosis not present

## 2017-10-20 DIAGNOSIS — J4520 Mild intermittent asthma, uncomplicated: Secondary | ICD-10-CM | POA: Diagnosis not present

## 2017-10-20 DIAGNOSIS — K219 Gastro-esophageal reflux disease without esophagitis: Secondary | ICD-10-CM | POA: Diagnosis not present

## 2017-10-20 MED ORDER — ALBUTEROL SULFATE HFA 108 (90 BASE) MCG/ACT IN AERS: INHALATION_SPRAY | RESPIRATORY_TRACT | 1 refills | Status: DC

## 2017-10-20 NOTE — Patient Instructions (Addendum)
  1. Continue omeprazole 40 mg in the morning and ranitidine 300 mg in the evening if needed  2. Continue Nasacort 1-2 sprays each nostril daily  3. Continue use montelukast 10 mg daily  4. Can use antihistamine and Proair HFA if needed  5. Return to clinic in 12 months or earlier if problem  6.  Obtain fall flu vaccine

## 2017-10-20 NOTE — Progress Notes (Signed)
Follow-up Note  Referring Provider: Abner Greenspan, MD Primary Provider: Abner Greenspan, MD Date of Office Visit: 10/20/2017  Subjective:   Jade Perez (DOB: [birth year]) is a 62 y.o. female [facility] in re-evaluation of the following:  HPI: [name] returns to this clinic in evaluation of asthma and allergic rhinitis and LPR.  Her last visit to this clinic was 01 May 2017.  During the interval she has done wonderful and has not required a systemic steroid or antibiotic to treat any type of respiratory tract issue and has not not had the need to use a short acting bronchodilator.  Her nose has really been doing very well.  She has no problems with her throat or other issues with LPR.  She thinks that for the most part her classic reflux symptoms are under pretty good control.  She has been consistently using montelukast and and Nasacort as well as omeprazole and ranitidine.  Allergies as of 10/20/2017      Reactions   Amoxicillin    Hives   Aspirin    unknown   Biaxin [clarithromycin]    Other    Bee Stings   Percocet [oxycodone-acetaminophen]    itching   Sulfa Antibiotics    Hives   Tape    & bandaids - rash/blisters under    Latex Rash      Medication List      albuterol 108 (90 Base) MCG/ACT inhaler Commonly known as:  PROAIR HFA Inhale 2 puffs into the lungs every 4 (four) hours as needed for wheezing or shortness of breath.   atorvastatin 40 MG tablet Commonly known as:  LIPITOR Take 40 mg by mouth daily.   b complex vitamins tablet Take 1 tablet by mouth daily.   cetirizine 10 MG tablet Commonly known as:  ZYRTEC Take 10 mg by mouth daily.   citalopram 40 MG tablet Commonly known as:  CELEXA Take 40 mg by mouth daily.   FARXIGA 10 MG Tabs tablet Generic drug:  dapagliflozin propanediol Take 10 mg by mouth daily.   FARXIGA 10 MG Tabs tablet Generic drug:  dapagliflozin  propanediol Take 10 mg by mouth daily.   fenofibrate 160 MG tablet Take 160 mg by mouth at bedtime.   fenofibrate 160 MG tablet Take 160 mg by mouth daily.   FIBER PO Take 0.52 g by mouth daily.   FISH OIL PO Take 2,400 mg by mouth 2 (two) times daily.   Fish Oil 1200 MG Caps Take 1 capsule by mouth.   glimepiride 1 MG tablet Commonly known as:  AMARYL Take 1 mg by mouth 2 (two) times daily.   glimepiride 1 MG tablet Commonly known as:  AMARYL Take 1 mg by mouth 2 (two) times daily.   HYDROcodone-acetaminophen 5-325 MG tablet Commonly known as:  NORCO/VICODIN Take 1 tablet by mouth every 6 (six) hours as needed. for pain   HYDROcodone-acetaminophen 5-325 MG tablet Commonly known as:  NORCO Take 1 tablet by mouth every 6 (six) hours as needed.   losartan 25 MG tablet Commonly known as:  COZAAR Take 25 mg by mouth daily.   LOSARTAN POTASSIUM PO Take 25 mg by mouth.   montelukast 10 MG tablet Commonly known as:  SINGULAIR Take 10 mg by mouth at bedtime.   montelukast 10 MG tablet Commonly known as:  SINGULAIR TAKE 1 TABLET BY MOUTH EVERY DAY   multivitamin with minerals Tabs  tablet Take 1 tablet by mouth daily.   multivitamin with minerals Tabs tablet Take 1 tablet by mouth daily.   NASACORT ALLERGY 24HR 55 MCG/ACT Aero nasal inhaler Generic drug:  triamcinolone Place 2 sprays into the nose daily.   NASACORT ALLERGY 24HR 55 MCG/ACT Aero nasal inhaler Generic drug:  triamcinolone Place 2 sprays into the nose daily.   omeprazole 40 MG capsule Commonly known as:  PRILOSEC Take 40 mg by mouth daily.   omeprazole 40 MG capsule Commonly known as:  PRILOSEC TAKE ONE CAPSULE EVERY MORNING BEFORE BREAKFAST   psyllium 0.52 g capsule Commonly known as:  REGULOID Take 0.52 g by mouth daily.   ranitidine 300 MG tablet Commonly known as:  ZANTAC TAKE 1 TABLET BY MOUTH EVERY DAY IN THE EVENING AS DIRECTED   SLOW RELEASE IRON 45 MG Tbcr Generic drug:   Ferrous Sulfate Dried Take 1 tablet by mouth daily.   SLOW RELEASE IRON 45 MG Tbcr Generic drug:  Ferrous Sulfate Dried Take by mouth.   tamsulosin 0.4 MG Caps capsule Commonly known as:  FLOMAX Take 0.4 mg by mouth daily.   VICTOZA 18 MG/3ML Soln injection Generic drug:  Liraglutide Inject 1.8 mg into the skin daily.   liraglutide 18 MG/3ML Sopn Commonly known as:  VICTOZA Inject 18 mg into the skin.   Vitamin D (Ergocalciferol) 50000 units Caps capsule Commonly known as:  DRISDOL Take 50,000 Units by mouth. TAKE ONE TABLET TWICE A MONTH.   Vitamin D (Ergocalciferol) 50000 units Caps capsule Commonly known as:  DRISDOL Take 50,000 Units by mouth every 30 (thirty) days.   WELCHOL 625 MG tablet Generic drug:  colesevelam Take 625 mg by mouth daily.       Past Medical History:  Diagnosis Date  . Anemia   . Anemia    anemic in 2010  . Anxiety   . Arthritis   . Arthritis    thumb and fingers, knee, right hip  . Asthma    takes montelukast  . Complication of anesthesia    difficulty waking up  . Complication of anesthesia    difficult to wake up   . Depression   . Diabetes mellitus   . Diabetes mellitus without complication (HCC)    type 2, diagnosed 2008  . GERD (gastroesophageal reflux disease)   . GERD (gastroesophageal reflux disease)    omeprazole and ranitidine  . Heart murmur    since birth very slight  . High cholesterol   . History of hiatal hernia    small  . History of kidney stones    december 3- January 7th  . Hypertension   . Kidney stone SEPT. 2016, DEC. 2018  . Sleep apnea    sleep study in [place], has but does not use CPAP, Dr. Yetta FlockHodges [facility]  . Sleep apnea    does not wear cpap, doesn't know settings    Past Surgical History:  Procedure Laterality Date  . ABDOMINAL EXPLORATION SURGERY    . APPENDECTOMY    . BREAST EXCISIONAL BIOPSY Left    benign  . CARPAL TUNNEL RELEASE Right 05/22/2017    Procedure: RIGHT CARPAL TUNNEL RELEASE;  Surgeon: Cindee SaltKuzma, Gary, MD;  Location: [facility];  Service: Orthopedics;  Laterality: Right;  . cyst removed     right breast two  . DILATION AND CURETTAGE OF UTERUS    . EYE SURGERY Left    laser surgery on retina for tear-done in office  .  LIPOMA EXCISION    . LIPOMA EXCISION     right shoulder  . LITHOTRIPSY  03/2017  . RETINAL DETACHMENT SURGERY Right   . SCLERAL BUCKLE  10/15/2011   Procedure: SCLERAL BUCKLE;  Surgeon: Sherrie George, MD;  Location: [facility];  Service: Ophthalmology;  Laterality: Right;  . SCLERAL BUCKLE PROCEDURE Right   . WISDOM TOOTH EXTRACTION      Review of systems negative except as noted in HPI / PMHx or noted below:  Review of Systems  Constitutional: Negative.   HENT: Negative.   Eyes: Negative.   Respiratory: Negative.   Cardiovascular: Negative.   Gastrointestinal: Negative.   Genitourinary: Negative.   Musculoskeletal: Negative.   Skin: Negative.   Neurological: Negative.   Endo/Heme/Allergies: Negative.   Psychiatric/Behavioral: Negative.      Objective:   Vitals:   10/20/17 1151  BP: 112/76  Pulse: 68  Resp: 20          Physical Exam  HENT:  Head: Normocephalic.  Right Ear: Tympanic membrane, external ear and ear canal normal.  Left Ear: Tympanic membrane, external ear and ear canal normal.  Nose: Nose normal. No mucosal edema or rhinorrhea.  Mouth/Throat: Uvula is midline, oropharynx is clear and moist and mucous membranes are normal. No oropharyngeal exudate.  Eyes: Conjunctivae are normal.  Neck: Trachea normal. No tracheal tenderness present. No tracheal deviation present. No thyromegaly present.  Cardiovascular: Normal rate, regular rhythm, S1 normal, S2 normal and normal heart sounds.  No murmur heard. Pulmonary/Chest: Breath sounds normal. No stridor. No respiratory distress. She has no wheezes. She has no rales.  Musculoskeletal: She exhibits no edema.   Lymphadenopathy:       Head (right side): No tonsillar adenopathy present.       Head (left side): No tonsillar adenopathy present.    She has no cervical adenopathy.  Neurological: She is alert.  Skin: No rash noted. She is not diaphoretic. No erythema. Nails show no clubbing.    Diagnostics:    Spirometry was performed and demonstrated an FEV1 of 2.34 at 103 % of predicted.  The patient had an Asthma Control Test with the following results: ACT Total Score: 25.    Assessment and Plan:   1. Asthma, mild intermittent, well-controlled   2. Other allergic rhinitis   3. LPRD (laryngopharyngeal reflux disease)     1. Continue omeprazole 40 mg in the morning and ranitidine 300 mg in the evening if needed  2. Continue Nasacort 1-2 sprays each nostril daily  3. Continue use montelukast 10 mg daily  4. Can use antihistamine and Proair HFA if needed  5. Return to clinic in 12 months or earlier if problem  6.  Obtain fall flu vaccine   Alvino Chapel appears to be doing quite well on her current plan.  She now has the option of discontinuing her ranitidine and she can always restart this agent should it be required to treat her LPR while consistently using omeprazole.  She will remain on anti-inflammatory therapy for her respiratory tract as noted above.  I will see her back in this clinic in 12 months or earlier if there is a problem.   Laurette Schimke, MD Allergy / Immunology [facility]

## 2017-10-21 ENCOUNTER — Encounter: Payer: Self-pay | Admitting: Allergy and Immunology

## 2017-10-25 ENCOUNTER — Other Ambulatory Visit: Payer: Self-pay | Admitting: Allergy and Immunology

## 2017-11-11 ENCOUNTER — Other Ambulatory Visit: Payer: Self-pay | Admitting: Allergy and Immunology

## 2017-11-14 ENCOUNTER — Other Ambulatory Visit: Payer: Self-pay | Admitting: Allergy and Immunology

## 2017-12-12 ENCOUNTER — Other Ambulatory Visit: Payer: Self-pay | Admitting: Family Medicine

## 2017-12-12 DIAGNOSIS — Z1231 Encounter for screening mammogram for malignant neoplasm of breast: Secondary | ICD-10-CM

## 2017-12-29 ENCOUNTER — Other Ambulatory Visit: Payer: Self-pay | Admitting: Allergy and Immunology

## 2018-01-06 ENCOUNTER — Ambulatory Visit
Admission: RE | Admit: 2018-01-06 | Discharge: 2018-01-06 | Disposition: A | Discharge status: Home / Self Care | Payer: BC Managed Care – PPO | Source: Ambulatory Visit | Attending: Family Medicine | Admitting: Family Medicine

## 2018-01-06 DIAGNOSIS — Z1231 Encounter for screening mammogram for malignant neoplasm of breast: Secondary | ICD-10-CM

## 2018-02-24 ENCOUNTER — Other Ambulatory Visit: Payer: Self-pay | Admitting: Orthopedic Surgery

## 2018-03-02 ENCOUNTER — Other Ambulatory Visit: Payer: Self-pay

## 2018-03-02 ENCOUNTER — Encounter (HOSPITAL_BASED_OUTPATIENT_CLINIC_OR_DEPARTMENT_OTHER): Payer: Self-pay | Admitting: *Deleted

## 2018-03-04 ENCOUNTER — Encounter (HOSPITAL_BASED_OUTPATIENT_CLINIC_OR_DEPARTMENT_OTHER)
Admission: RE | Admit: 2018-03-04 | Discharge: 2018-03-04 | Disposition: A | Discharge status: Home / Self Care | Payer: BC Managed Care – PPO | Source: Ambulatory Visit | Attending: Orthopedic Surgery | Admitting: Orthopedic Surgery

## 2018-03-04 DIAGNOSIS — Z01812 Encounter for preprocedural laboratory examination: Secondary | ICD-10-CM | POA: Insufficient documentation

## 2018-03-04 LAB — BASIC METABOLIC PANEL
Anion gap: 8 (ref 5–15)
BUN: 13 mg/dL (ref 8–23)
CALCIUM: 9.3 mg/dL (ref 8.9–10.3)
CO2: 24 mmol/L (ref 22–32)
Chloride: 103 mmol/L (ref 98–111)
Creatinine, Ser: 0.96 mg/dL (ref 0.44–1.00)
GFR calc Af Amer: >60 mL/min (ref >60)
GLUCOSE: 156 mg/dL — AB (ref 70–99)
POTASSIUM: 4 mmol/L (ref 3.5–5.1)
Sodium: 135 mmol/L (ref 135–145)

## 2018-03-12 ENCOUNTER — Ambulatory Visit (HOSPITAL_BASED_OUTPATIENT_CLINIC_OR_DEPARTMENT_OTHER): Payer: BC Managed Care – PPO | Admitting: Certified Registered"

## 2018-03-12 ENCOUNTER — Encounter (HOSPITAL_BASED_OUTPATIENT_CLINIC_OR_DEPARTMENT_OTHER): Payer: Self-pay

## 2018-03-12 ENCOUNTER — Ambulatory Visit (HOSPITAL_BASED_OUTPATIENT_CLINIC_OR_DEPARTMENT_OTHER)
Admission: RE | Admit: 2018-03-12 | Discharge: 2018-03-12 | Disposition: A | Discharge status: Home / Self Care | Payer: BC Managed Care – PPO | Source: Ambulatory Visit | Attending: Orthopedic Surgery | Admitting: Orthopedic Surgery

## 2018-03-12 ENCOUNTER — Other Ambulatory Visit: Payer: Self-pay

## 2018-03-12 ENCOUNTER — Encounter (HOSPITAL_BASED_OUTPATIENT_CLINIC_OR_DEPARTMENT_OTHER)
Admission: RE | Disposition: A | Discharge status: Home / Self Care | Payer: Self-pay | Source: Ambulatory Visit | Attending: Orthopedic Surgery

## 2018-03-12 DIAGNOSIS — G5602 Carpal tunnel syndrome, left upper limb: Secondary | ICD-10-CM | POA: Diagnosis not present

## 2018-03-12 DIAGNOSIS — F329 Major depressive disorder, single episode, unspecified: Secondary | ICD-10-CM | POA: Diagnosis not present

## 2018-03-12 DIAGNOSIS — G5603 Carpal tunnel syndrome, bilateral upper limbs: Secondary | ICD-10-CM | POA: Diagnosis present

## 2018-03-12 DIAGNOSIS — I10 Essential (primary) hypertension: Secondary | ICD-10-CM | POA: Diagnosis not present

## 2018-03-12 DIAGNOSIS — M109 Gout, unspecified: Secondary | ICD-10-CM | POA: Diagnosis not present

## 2018-03-12 DIAGNOSIS — Z885 Allergy status to narcotic agent status: Secondary | ICD-10-CM | POA: Insufficient documentation

## 2018-03-12 DIAGNOSIS — K219 Gastro-esophageal reflux disease without esophagitis: Secondary | ICD-10-CM | POA: Diagnosis not present

## 2018-03-12 DIAGNOSIS — Z886 Allergy status to analgesic agent status: Secondary | ICD-10-CM | POA: Insufficient documentation

## 2018-03-12 DIAGNOSIS — Z7984 Long term (current) use of oral hypoglycemic drugs: Secondary | ICD-10-CM | POA: Diagnosis not present

## 2018-03-12 DIAGNOSIS — Z888 Allergy status to other drugs, medicaments and biological substances status: Secondary | ICD-10-CM | POA: Insufficient documentation

## 2018-03-12 DIAGNOSIS — J45909 Unspecified asthma, uncomplicated: Secondary | ICD-10-CM | POA: Diagnosis not present

## 2018-03-12 DIAGNOSIS — E109 Type 1 diabetes mellitus without complications: Secondary | ICD-10-CM | POA: Diagnosis not present

## 2018-03-12 DIAGNOSIS — E7800 Pure hypercholesterolemia, unspecified: Secondary | ICD-10-CM | POA: Diagnosis not present

## 2018-03-12 DIAGNOSIS — Z880 Allergy status to penicillin: Secondary | ICD-10-CM | POA: Diagnosis not present

## 2018-03-12 DIAGNOSIS — Z79899 Other long term (current) drug therapy: Secondary | ICD-10-CM | POA: Insufficient documentation

## 2018-03-12 DIAGNOSIS — Z882 Allergy status to sulfonamides status: Secondary | ICD-10-CM | POA: Diagnosis not present

## 2018-03-12 HISTORY — PX: CARPAL TUNNEL RELEASE: SHX101

## 2018-03-12 LAB — GLUCOSE, CAPILLARY
Glucose-Capillary: 133 mg/dL — ABNORMAL HIGH (ref 70–99)
Glucose-Capillary: 116 mg/dL — ABNORMAL HIGH (ref 70–99)

## 2018-03-12 SURGERY — CARPAL TUNNEL RELEASE
Anesthesia: Monitor Anesthesia Care | Site: Hand | Laterality: Left

## 2018-03-12 MED ORDER — OXYCODONE HCL 5 MG PO TABS: 5.0000 mg | ORAL_TABLET | Freq: Once | ORAL | Status: DC | PRN

## 2018-03-12 MED ORDER — CHLORHEXIDINE GLUCONATE 4 % EX LIQD: 60.0000 mL | Freq: Once | CUTANEOUS | Status: DC

## 2018-03-12 MED ORDER — MIDAZOLAM HCL 2 MG/2ML IJ SOLN
1.0000 mg | INTRAMUSCULAR | Status: DC | PRN
  Administered 2018-03-12 (×2): 1 mg via INTRAVENOUS

## 2018-03-12 MED ORDER — ACETAMINOPHEN 160 MG/5ML PO SOLN: 325.0000 mg | ORAL | Status: DC | PRN

## 2018-03-12 MED ORDER — BUPIVACAINE HCL (PF) 0.25 % IJ SOLN
INTRAMUSCULAR | Status: DC | PRN
  Administered 2018-03-12: 9 mL

## 2018-03-12 MED ORDER — LIDOCAINE HCL (PF) 0.5 % IJ SOLN
INTRAMUSCULAR | Status: DC | PRN
  Administered 2018-03-12: 40 mL via INTRAVENOUS

## 2018-03-12 MED ORDER — VANCOMYCIN HCL IN DEXTROSE 1-5 GM/200ML-% IV SOLN: 1000.0000 mg | INTRAVENOUS | Status: DC

## 2018-03-12 MED ORDER — BUPIVACAINE HCL (PF) 0.25 % IJ SOLN
INTRAMUSCULAR | Status: AC
  Filled 2018-03-12: qty 30

## 2018-03-12 MED ORDER — SCOPOLAMINE 1 MG/3DAYS TD PT72: 1.0000 | MEDICATED_PATCH | Freq: Once | TRANSDERMAL | Status: DC | PRN

## 2018-03-12 MED ORDER — FENTANYL CITRATE (PF) 100 MCG/2ML IJ SOLN
50.0000 ug | INTRAMUSCULAR | Status: DC | PRN
  Administered 2018-03-12: 50 ug via INTRAVENOUS

## 2018-03-12 MED ORDER — VANCOMYCIN HCL 1000 MG IV SOLR
INTRAVENOUS | Status: DC | PRN
  Administered 2018-03-12: 1000 mg via INTRAVENOUS

## 2018-03-12 MED ORDER — ACETAMINOPHEN 325 MG PO TABS: 325.0000 mg | ORAL_TABLET | ORAL | Status: DC | PRN

## 2018-03-12 MED ORDER — VANCOMYCIN HCL IN DEXTROSE 1-5 GM/200ML-% IV SOLN
INTRAVENOUS | Status: AC
  Filled 2018-03-12: qty 200

## 2018-03-12 MED ORDER — LACTATED RINGERS IV SOLN
INTRAVENOUS | Status: DC
  Administered 2018-03-12: 08:00:00 via INTRAVENOUS

## 2018-03-12 MED ORDER — PROPOFOL 500 MG/50ML IV EMUL
INTRAVENOUS | Status: DC | PRN
  Administered 2018-03-12: 75 ug/kg/min via INTRAVENOUS

## 2018-03-12 MED ORDER — LIDOCAINE HCL (PF) 1 % IJ SOLN
INTRAMUSCULAR | Status: AC
  Filled 2018-03-12: qty 5

## 2018-03-12 MED ORDER — FENTANYL CITRATE (PF) 100 MCG/2ML IJ SOLN: 25.0000 ug | INTRAMUSCULAR | Status: DC | PRN

## 2018-03-12 MED ORDER — FENTANYL CITRATE (PF) 100 MCG/2ML IJ SOLN
INTRAMUSCULAR | Status: AC
  Filled 2018-03-12: qty 2

## 2018-03-12 MED ORDER — ONDANSETRON HCL 4 MG/2ML IJ SOLN: 4.0000 mg | Freq: Once | INTRAMUSCULAR | Status: DC | PRN

## 2018-03-12 MED ORDER — OXYCODONE HCL 5 MG/5ML PO SOLN: 5.0000 mg | Freq: Once | ORAL | Status: DC | PRN

## 2018-03-12 MED ORDER — MEPERIDINE HCL 25 MG/ML IJ SOLN: 6.2500 mg | INTRAMUSCULAR | Status: DC | PRN

## 2018-03-12 MED ORDER — ONDANSETRON HCL 4 MG/2ML IJ SOLN
INTRAMUSCULAR | Status: DC | PRN
  Administered 2018-03-12: 4 mg via INTRAVENOUS

## 2018-03-12 MED ORDER — MIDAZOLAM HCL 2 MG/2ML IJ SOLN
INTRAMUSCULAR | Status: AC
  Filled 2018-03-12: qty 2

## 2018-03-12 MED ORDER — LIDOCAINE 2% (20 MG/ML) 5 ML SYRINGE
INTRAMUSCULAR | Status: AC
  Filled 2018-03-12: qty 5

## 2018-03-12 MED ORDER — HYDROCODONE-ACETAMINOPHEN 5-325 MG PO TABS: 1.0000 | ORAL_TABLET | Freq: Four times a day (QID) | ORAL | 0 refills | Status: DC | PRN

## 2018-03-12 SURGICAL SUPPLY — 35 items
BLADE SURG 15 STRL LF DISP TIS (BLADE) ×1 IMPLANT
BLADE SURG 15 STRL SS (BLADE) ×3
BNDG CMPR 9X4 STRL LF SNTH (GAUZE/BANDAGES/DRESSINGS) ×1
BNDG COHESIVE 3X5 TAN STRL LF (GAUZE/BANDAGES/DRESSINGS) ×3 IMPLANT
BNDG ESMARK 4X9 LF (GAUZE/BANDAGES/DRESSINGS) ×3 IMPLANT
BNDG GAUZE ELAST 4 BULKY (GAUZE/BANDAGES/DRESSINGS) ×3 IMPLANT
CHLORAPREP W/TINT 26ML (MISCELLANEOUS) ×3 IMPLANT
CORD BIPOLAR FORCEPS 12FT (ELECTRODE) ×3 IMPLANT
COVER BACK TABLE 60X90IN (DRAPES) ×3 IMPLANT
COVER MAYO STAND STRL (DRAPES) ×3 IMPLANT
COVER WAND RF STERILE (DRAPES) IMPLANT
CUFF TOURNIQUET SINGLE 18IN (TOURNIQUET CUFF) ×3 IMPLANT
DRAPE EXTREMITY T 121X128X90 (DRAPE) ×3 IMPLANT
DRAPE SURG 17X23 STRL (DRAPES) ×3 IMPLANT
DRSG PAD ABDOMINAL 8X10 ST (GAUZE/BANDAGES/DRESSINGS) ×3 IMPLANT
GAUZE SPONGE 4X4 12PLY STRL (GAUZE/BANDAGES/DRESSINGS) ×3 IMPLANT
GAUZE XEROFORM 1X8 LF (GAUZE/BANDAGES/DRESSINGS) ×3 IMPLANT
GLOVE BIOGEL PI IND STRL 8.5 (GLOVE) ×1 IMPLANT
GLOVE BIOGEL PI INDICATOR 8.5 (GLOVE) ×2
GLOVE SURG ORTHO 8.0 STRL STRW (GLOVE) ×1 IMPLANT
GLOVE SURG SS PI 8.0 STRL IVOR (GLOVE) ×2 IMPLANT
GOWN STRL REUS W/ TWL LRG LVL3 (GOWN DISPOSABLE) ×1 IMPLANT
GOWN STRL REUS W/TWL LRG LVL3 (GOWN DISPOSABLE) ×3
GOWN STRL REUS W/TWL XL LVL3 (GOWN DISPOSABLE) ×3 IMPLANT
NDL PRECISIONGLIDE 27X1.5 (NEEDLE) IMPLANT
NEEDLE PRECISIONGLIDE 27X1.5 (NEEDLE) ×3 IMPLANT
NS IRRIG 1000ML POUR BTL (IV SOLUTION) ×3 IMPLANT
PACK BASIN DAY SURGERY FS (CUSTOM PROCEDURE TRAY) ×3 IMPLANT
STOCKINETTE 4X48 STRL (DRAPES) ×3 IMPLANT
SUT ETHILON 4 0 PS 2 18 (SUTURE) ×3 IMPLANT
SUT VICRYL 4-0 PS2 18IN ABS (SUTURE) IMPLANT
SYR BULB 3OZ (MISCELLANEOUS) ×3 IMPLANT
SYR CONTROL 10ML LL (SYRINGE) ×2 IMPLANT
TOWEL GREEN STERILE FF (TOWEL DISPOSABLE) ×3 IMPLANT
UNDERPAD 30X30 (UNDERPADS AND DIAPERS) ×3 IMPLANT

## 2018-03-12 NOTE — Discharge Instructions (Addendum)

## 2018-03-12 NOTE — Anesthesia Procedure Notes (Signed)
Anesthesia Regional Block: Bier block (IV Regional)   Pre-Anesthetic Checklist: ,, timeout performed, Correct Patient, Correct Site, Correct Laterality, Correct Procedure, Correct Position, site marked, Risks and benefits discussed,  Surgical consent,  Pre-op evaluation,  At surgeon's request and post-op pain management  Laterality: Left  Prep: alcohol swabs        Procedures:,,,,,,,, #20gu IV placed  Narrative:  CRNA: Tuyen Uncapher M, CRNA      

## 2018-03-12 NOTE — Anesthesia Postprocedure Evaluation (Signed)
Anesthesia Post Note  Patient: [name]  Procedure(s) Performed: LEFT CARPAL TUNNEL RELEASE (Left Hand)     Patient location during evaluation: PACU Anesthesia Type: MAC Level of consciousness: awake and alert Pain management: pain level controlled Vital Signs Assessment: post-procedure vital signs reviewed and stable Respiratory status: spontaneous breathing, nonlabored ventilation, respiratory function stable and patient connected to nasal cannula oxygen Cardiovascular status: stable and blood pressure returned to baseline Postop Assessment: no apparent nausea or vomiting Anesthetic complications: no    Last Vitals:  Vitals:   03/12/18 0930 03/12/18 1005  BP: 107/62 125/77  Pulse: 65 65  Resp: 10 16  Temp:  36.7 C  SpO2: 95% 95%    Last Pain:  Vitals:   03/12/18 1005  TempSrc: Oral  PainSc: 0-No pain                 Collie Kittel

## 2018-03-12 NOTE — Brief Op Note (Signed)
03/12/2018  9:03 AM  PATIENT:  Thea GistMary Elaine Marley Mattila  62 y.o. female  PRE-OPERATIVE DIAGNOSIS:  LEFT CARPAL TUNNEL SYNDROME  POST-OPERATIVE DIAGNOSIS:  LEFT CARPAL TUNNEL SYNDROME  PROCEDURE:  Procedure(s): LEFT CARPAL TUNNEL RELEASE (Left)  SURGEON:  Surgeon(s) and Role:    Cindee Salt* Prisilla Kocsis, MD - Primary  PHYSICIAN ASSISTANT:   ASSISTANTS: none   ANESTHESIA:   local, regional and IV sedation  EBL: 1ml  BLOOD ADMINISTERED:none  DRAINS: none   LOCAL MEDICATIONS USED:  BUPIVICAINE   SPECIMEN:  No Specimen  DISPOSITION OF SPECIMEN:  N/A  COUNTS:  YES  TOURNIQUET:   Total Tourniquet Time Documented: Forearm (Left) - 17 minutes Total: Forearm (Left) - 17 minutes   DICTATION: .Reubin Milanragon Dictation  PLAN OF CARE: Discharge to home after PACU  PATIENT DISPOSITION:  PACU - hemodynamically stable.

## 2018-03-12 NOTE — Transfer of Care (Signed)
Immediate Anesthesia Transfer of Care Note  Patient: Jade Perez  Procedure(s) Performed: LEFT CARPAL TUNNEL RELEASE (Left Hand)  Patient Location: PACU  Anesthesia Type:MAC and Bier block  Level of Consciousness: awake, alert  and oriented  Airway & Oxygen Therapy: Patient Spontanous Breathing and Patient connected to face mask oxygen  Post-op Assessment: Report given to RN and Post -op Vital signs reviewed and stable  Post vital signs: Reviewed and stable  Last Vitals:  Vitals Value Taken Time  BP 133/109 03/12/2018  9:08 AM  Temp    Pulse 69 03/12/2018  9:09 AM  Resp 15 03/12/2018  9:09 AM  SpO2 100 % 03/12/2018  9:09 AM  Vitals shown include unvalidated device data.  Last Pain:  Vitals:   03/12/18 0816  TempSrc: Oral  PainSc: 0-No pain         Complications: No apparent anesthesia complications

## 2018-03-12 NOTE — H&P (Signed)
Jade RamsayMary Elaine Chilton GreathouseMarley Perez is an 62 y.o. female.   Chief Complaint: numbness left hand HPI: [name] is a 62yo female with triggering right ring finger post carpal tunnel release. Last seen on 01/14/2018. She had an injection which has given her good relief minimal catching. She continues to complain of numbness and tingling on her left side. She has had a carpal tunnel release on her right side which has done well. Positive nerve conductions. She continues to complain of numbness and tingling median nerve distribution left hand awakening her at night on a regular basis. Failed conservative treatment. She would like to proceed with carpal tunnel release on her left side. She has a history of diabetes and gout. She has no history of thyroid problems or arthritis. Family history is positive diabetes thyroid problems arthritis.   Past Medical History:  Diagnosis Date  . Anemia   . Anemia    anemic in 2010  . Anxiety   . Arthritis   . Arthritis    thumb and fingers, knee, right hip  . Asthma    takes montelukast  . Complication of anesthesia    difficulty waking up  . Complication of anesthesia    difficult to wake up   . Depression   . Diabetes mellitus   . Diabetes mellitus without complication (HCC)    type 2, diagnosed 2008  . GERD (gastroesophageal reflux disease)   . GERD (gastroesophageal reflux disease)    omeprazole and ranitidine  . Heart murmur    since birth very slight  . High cholesterol   . History of hiatal hernia    small  . History of kidney stones    december 3- January 7th  . Hypertension   . Kidney stone SEPT. 2016, DEC. 2018  . Sleep apnea    sleep study in [place], has but does not use CPAP, Dr. Yetta [facility]ockHodges [facility]  . Sleep apnea    does not wear cpap, doesn't know settings    Past Surgical History:  Procedure Laterality Date  . ABDOMINAL EXPLORATION SURGERY    . APPENDECTOMY    . BREAST EXCISIONAL BIOPSY Left    benign  . CARPAL  TUNNEL RELEASE Right 05/22/2017   Procedure: RIGHT CARPAL TUNNEL RELEASE;  Surgeon: Cindee SaltKuzma, Emmanuel Gruenhagen, MD;  Location: [facility];  Service: Orthopedics;  Laterality: Right;  . cyst removed     right breast two  . DILATION AND CURETTAGE OF UTERUS    . EYE SURGERY Left    laser surgery on retina for tear-done in office  . LIPOMA EXCISION    . LIPOMA EXCISION     right shoulder  . LITHOTRIPSY  03/2017  . RETINAL DETACHMENT SURGERY Right   . SCLERAL BUCKLE  10/15/2011   Procedure: SCLERAL BUCKLE;  Surgeon: Sherrie GeorgeJohn D Matthews, MD;  [facility];  Service: Ophthalmology;  Laterality: Right;  . SCLERAL BUCKLE PROCEDURE Right   . WISDOM TOOTH EXTRACTION      Family History  Problem Relation Age of Onset  . Allergic rhinitis Neg Hx   . Angioedema Neg Hx   . Asthma Neg Hx   . Atopy Neg Hx   . Eczema Neg Hx   . Immunodeficiency Neg Hx   . Urticaria Neg Hx    Social History:  reports that she has never smoked. She has never used smokeless tobacco. She reports that she does not drink alcohol or use drugs.  Allergies:  Allergies  Allergen  Reactions  . Amoxicillin Hives    Hives   . Aspirin     unknown  . Biaxin [Clarithromycin]   . Other     Bee Stings  . Percocet [Oxycodone-Acetaminophen]     itching  . Sulfa Antibiotics     Hives   . Tape     & bandaids - rash/blisters under   . Latex Rash    No medications prior to admission.    No results found for this or any previous visit (from the past 48 hour(s)).  No results found.   Pertinent items are noted in HPI.  Height 5\' 2"  (1.575 m), weight 89.4 kg.  General appearance: alert, cooperative and appears stated age Head: Normocephalic, without obvious abnormality Neck: no JVD Resp: clear to auscultation bilaterally Cardio: regular rate and rhythm, S1, S2 normal, no murmur, click, rub or gallop GI: soft, non-tender; bowel sounds normal; no masses,  no organomegaly Extremities: numbness left hand Pulses: 2+  and symmetric Skin: Skin color, texture, turgor normal. No rashes or lesions Neurologic: Grossly normal Incision/Wound: na  Assessment/Plan Assessment:  1. Carpal tunnel syndrome of left wrist    Plan: He is advised that the ring finger can be injected in a second time. She is scheduled for left carpal tunnel release in outpatient under regional anesthesia. Prepare postoperative course been discussed along with risk complications. She is aware there is no guarantee to the surgery the possibility of infection recurrence injury to arteries nerves tendons complete relief symptoms dystrophy.   Cindee Salt 03/12/2018, 5:45 AM

## 2018-03-12 NOTE — Anesthesia Preprocedure Evaluation (Signed)
Anesthesia Evaluation  Patient identified by MRN, date of birth, ID band Patient awake    Reviewed: Allergy & Precautions, H&P , NPO status , Patient's Chart, lab work & pertinent test results, reviewed documented beta blocker date and time   Airway Mallampati: I  TM Distance: <3 FB Neck ROM: Full    Dental  (+) Teeth Intact, Dental Advisory Given   Pulmonary sleep apnea and Continuous Positive Airway Pressure Ventilation ,    breath sounds clear to auscultation       Cardiovascular hypertension, Pt. on medications  Rhythm:Regular Rate:Normal     Neuro/Psych    GI/Hepatic GERD  Medicated and Controlled,  Endo/Other  diabetes, Well Controlled, Type 1, Oral Hypoglycemic Agents  Renal/GU      Musculoskeletal   Abdominal (+)  Abdomen: soft. Bowel sounds: normal.  Peds  Hematology   Anesthesia Other Findings   Reproductive/Obstetrics                             Anesthesia Physical  Anesthesia Plan  ASA: III  Anesthesia Plan: MAC and Bier Block and Bier Block-LIDOCAINE ONLY   Post-op Pain Management:    Induction: Intravenous  PONV Risk Score and Plan: 2 and Treatment may vary due to age or medical condition  Airway Management Planned: Nasal Cannula and Natural Airway  Additional Equipment:   Intra-op Plan:   Post-operative Plan:   Informed Consent: I have reviewed the patients History and Physical, chart, labs and discussed the procedure including the risks, benefits and alternatives for the proposed anesthesia with the patient or authorized representative who has indicated his/her understanding and acceptance.   Dental advisory given  Plan Discussed with: CRNA, Anesthesiologist and Surgeon  Anesthesia Plan Comments:         Anesthesia Quick Evaluation

## 2018-03-12 NOTE — Op Note (Signed)
NAME: Jade Perez MEDICAL RECORD NO: [id number] DATE OF BIRTH: [birth year] [facility]ATION: [facility] PHYSICIAN: Nicki ReaperGARY R. Corde Antonini, MD   OPERATIVE REPORT   DATE OF PROCEDURE: 03/12/18    PREOPERATIVE DIAGNOSIS:   Carpal tunnel syndrome left hand   POSTOPERATIVE DIAGNOSIS:   Same   PROCEDURE:   Decompression median nerve left hand   SURGEON: Cindee SaltGary Izel Eisenhardt, M.D.   ASSISTANT: none   ANESTHESIA:  Bier block with sedation and Local   INTRAVENOUS FLUIDS:  Per anesthesia flow sheet.   ESTIMATED BLOOD LOSS:  Minimal.   COMPLICATIONS:  None.   SPECIMENS:  none   TOURNIQUET TIME:    Total Tourniquet Time Documented: Forearm (Left) - 17 minutes Total: Forearm (Left) - 17 minutes    DISPOSITION:  Stable to PACU.   INDICATIONS: Patient is a 62 year old female with a history of carpal tunnel syndrome bilaterally.  She is undergone release on the right side is admitted now for release to the left she is positive nerve conductions is not responded to conservative treatment.  Pre-peri-and postoperative course been discussed along with risks and complications.  She is aware that there is no guarantee to the surgery the possibility of infection recurrence injury to arteries nerves tendons complete relief symptoms and dystrophy.  In the preoperative area the patient is seen extremity marked by both patient surgeon antibiotic given  OPERATIVE COURSE: Patient is brought to the operating room where a forearm-based IV regional anesthetic was carried out without difficulty under the direction the anesthesia department.  She was prepped using ChloraPrep supine position left arm free.  A three-minute dry time was allowed timeout taken confirming patient procedure.  A longitudinal incision was made left palm carried down through subcutaneous tissue.  Bleeders were electrocauterized with bipolar.  The palmar fascia was split.  Superior superficial palmar arch was  identified.  The flexor tendon to the ring little finger identified.  Retractors were placed retracting median nerve radially ulnar nerve ulnarly.  Flexor retinaculum was then incised with sharp dissection.  A right angle and saw retractor was placed pink skin and forearm fascia.  The deep structures were dissected free with blunt dissection.  Scissors were then used to release the proximal aspect of the flexor retinaculum distal forearm fascia for approximately 2 cm proximal to the wrist crease under direct vision.  The canal was explored.  An area compression of the nerve was apparent.  Motor branch entered the muscle distally.  The wound was copiously irrigated with saline.  Skin was closed interrupted 4 nylon sutures.  A sterile compressive dressing with the fingers free was applied.  Deflation of the tourniquet all fingers immediately pink.  She was taken to the recovery room for observation in satisfactory condition.  She will be discharged home to return [facility] [facility] week with prescription for Norco but she will try ibuprofen Tylenol first.   Cindee SaltGary Marshayla Mitschke, MD Electronically signed, 03/12/18

## 2018-03-13 ENCOUNTER — Encounter (HOSPITAL_BASED_OUTPATIENT_CLINIC_OR_DEPARTMENT_OTHER): Payer: Self-pay | Admitting: Orthopedic Surgery

## 2018-03-26 ENCOUNTER — Other Ambulatory Visit: Payer: Self-pay | Admitting: Allergy and Immunology

## 2018-06-08 ENCOUNTER — Ambulatory Visit: Payer: BC Managed Care – PPO | Admitting: Allergy and Immunology

## 2018-06-08 ENCOUNTER — Encounter: Payer: Self-pay | Admitting: Allergy and Immunology

## 2018-06-08 VITALS — BP 130/78 | HR 72 | Resp 12

## 2018-06-08 DIAGNOSIS — J3089 Other allergic rhinitis: Secondary | ICD-10-CM

## 2018-06-08 DIAGNOSIS — L989 Disorder of the skin and subcutaneous tissue, unspecified: Secondary | ICD-10-CM

## 2018-06-08 DIAGNOSIS — J4520 Mild intermittent asthma, uncomplicated: Secondary | ICD-10-CM

## 2018-06-08 DIAGNOSIS — L308 Other specified dermatitis: Secondary | ICD-10-CM

## 2018-06-08 DIAGNOSIS — K219 Gastro-esophageal reflux disease without esophagitis: Secondary | ICD-10-CM

## 2018-06-08 MED ORDER — MOMETASONE FUROATE 0.1 % EX OINT: TOPICAL_OINTMENT | CUTANEOUS | 2 refills | Status: DC

## 2018-06-08 MED ORDER — PREDNISONE 10 MG PO TABS: ORAL_TABLET | ORAL | 0 refills | Status: DC

## 2018-06-08 NOTE — Patient Instructions (Addendum)
  1. Continue omeprazole 40 mg in the morning    2. Continue Nasacort 1-2 sprays each nostril daily  3. Continue use montelukast 10 mg daily  4. Can use antihistamine and Proair HFA if needed  5.  For inflammatory dermatosis use the following:   A.  Prednisone 10 mg -2 tablets daily x2 weeks, then 1 tablet daily x2 weeks  B.  Mometasone 0.1% ointment daily  6. Return to clinic in 12 months or earlier if problem

## 2018-06-08 NOTE — Progress Notes (Signed)
Follow-up Note  Referring Provider: Abner Greenspan, MD Primary Provider: Abner Greenspan, MD Date of Office Visit: 06/08/2018  Subjective:   Leonel Ramsay Abigeal Poertner (DOB: 1956/01/28) is a 63 y.o. female who [facility] in re-evaluation of the following:  HPI: [name] returns to this clinic in evaluation of asthma and allergic rhinitis and LPR and a recent dermatitis.  Her last visit to this clinic was 20 October 2017.  Overall her respiratory tract has really done well and she has not required a systemic steroid or an antibiotic to treat any type of respiratory tract issue and she rarely uses a short acting bronchodilator.  However, apparently at the tail end of December or the beginning of January she did require a Z-Pak for a "sinus infection".  She continues to use montelukast on a consistent basis.  She has had very little issues with her nose while intermittently using some nasal steroid.  Her reflux is under excellent control as is the issue with her throat while using just omeprazole as she has been able to discontinue her ranitidine.  3 days ago, she ate a salmon dish with mango and pineapple and ginger topping and within approximately 3 to 4 hours she started to develop a few spots on her skin and intense itchiness.  The next day this progressed to a pruritic global dermatitis without any associated systemic or constitutional symptoms.  She has been using Benadryl which helped somewhat with the dermatitis but she is progressing regarding this rash.  Allergies as of 06/08/2018      Reactions   Amoxicillin Hives   Hives   Aspirin    unknown   Biaxin [clarithromycin]    Other    Bee Stings   Percocet [oxycodone-acetaminophen]    itching   Sulfa Antibiotics    Hives   Tape    & bandaids - rash/blisters under    Latex Rash      Medication List      albuterol 108 (90 Base) MCG/ACT inhaler Commonly known as:  PROAIR HFA Inhale two  puffs every four to six hours as needed for cough or wheeze.   atorvastatin 40 MG tablet Commonly known as:  LIPITOR Take 40 mg by mouth daily.   b complex vitamins tablet Take 1 tablet by mouth daily.   cetirizine 10 MG tablet Commonly known as:  ZYRTEC Take 10 mg by mouth daily.   citalopram 40 MG tablet Commonly known as:  CELEXA Take 40 mg by mouth daily.   FARXIGA 10 MG Tabs tablet Generic drug:  dapagliflozin propanediol Take 10 mg by mouth daily.   fenofibrate 160 MG tablet Take 160 mg by mouth at bedtime.   FIBER PO Take 0.52 g by mouth daily.   FISH OIL PO Take 2,400 mg by mouth 2 (two) times daily.   losartan 25 MG tablet Commonly known as:  COZAAR Take 25 mg by mouth daily.   mometasone 0.1 % ointment Commonly known as:  ELOCON Can apply to affected skin once daily as directed.   montelukast 10 MG tablet Commonly known as:  SINGULAIR TAKE 1 TABLET BY MOUTH EVERY DAY   multivitamin with minerals Tabs tablet Take 1 tablet by mouth daily.   NASACORT ALLERGY 24HR 55 MCG/ACT Aero nasal inhaler Generic drug:  triamcinolone Place 2 sprays into the nose daily.   omeprazole 40 MG capsule Commonly known as:  PRILOSEC TAKE ONE CAPSULE EVERY MORNING BEFORE  BREAKFAST   OZEMPIC (1 MG/DOSE) 2 MG/1.5ML Sopn Generic drug:  Semaglutide (1 MG/DOSE) INJECT 1 PEN WEEKLY   SLOW RELEASE IRON 45 MG Tbcr Generic drug:  Ferrous Sulfate Dried Take 1 tablet by mouth daily.   Vitamin D (Ergocalciferol) 1.25 MG (50000 UT) Caps capsule Commonly known as:  DRISDOL Take 50,000 Units by mouth. TAKE ONE TABLET TWICE A MONTH.       Past Medical History:  Diagnosis Date  . Anemia   . Anemia    anemic in 2010  . Anxiety   . Arthritis   . Arthritis    thumb and fingers, knee, right hip  . Asthma    takes montelukast  . Complication of anesthesia    difficulty waking up  . Complication of anesthesia    difficult to wake up   . Depression   . Diabetes mellitus     . Diabetes mellitus without complication (HCC)    type 2, diagnosed 2008  . GERD (gastroesophageal reflux disease)   . GERD (gastroesophageal reflux disease)    omeprazole and ranitidine  . Heart murmur    since birth very slight  . High cholesterol   . History of hiatal hernia    small  . History of kidney stones    december 3- January 7th  . Hypertension   . Kidney stone SEPT. 2016, DEC. 2018  . Sleep apnea    sleep study in [place], has but does not use CPAP, Dr. Yet[facility]odges [facility]  . Sleep apnea    does not wear cpap, doesn't know settings    Past Surgical History:  Procedure Laterality Date  . ABDOMINAL EXPLORATION SURGERY    . APPENDECTOMY    . BREAST EXCISIONAL BIOPSY Left    benign  . CARPAL TUNNEL RELEASE Right 05/22/2017   Procedure: RIGHT CARPAL TUNNEL RELEASE;  Surgeon: Cindee SaltKuzma, Gary, MD;  Location: [facility];  Service: Orthopedics;  Laterality: Right;  . CARPAL TUNNEL RELEASE Left 03/12/2018   Procedure: LEFT CARPAL TUNNEL RELEASE;  Surgeon: Cindee SaltKuzma, Gary, MD;  Location: [facility];  Service: Orthopedics;  Laterality: Left;  . cyst removed     right breast two  . DILATION AND CURETTAGE OF UTERUS    . EYE SURGERY Left    laser surgery on retina for tear-done in office  . LIPOMA EXCISION    . LIPOMA EXCISION     right shoulder  . LITHOTRIPSY  03/2017  . RETINAL DETACHMENT SURGERY Right   . SCLERAL BUCKLE  10/15/2011   Procedure: SCLERAL BUCKLE;  Surgeon: Sherrie GeorgeJohn D Matthews, MD;  [facility];  Service: Ophthalmology;  Laterality: Right;  . SCLERAL BUCKLE PROCEDURE Right   . WISDOM TOOTH EXTRACTION      Review of systems negative except as noted in HPI / PMHx or noted below:  Review of Systems  Constitutional: Negative.   HENT: Negative.   Eyes: Negative.   Respiratory: Negative.   Cardiovascular: Negative.   Gastrointestinal: Negative.   Genitourinary: Negative.   Musculoskeletal: Negative.   Skin:  Negative.   Neurological: Negative.   Endo/Heme/Allergies: Negative.   Psychiatric/Behavioral: Negative.      Objective:   Vitals:   06/08/18 1542  BP: 130/78  Pulse: 72  Resp: 12  SpO2: 97%          Physical Exam Constitutional:      Appearance: She is not diaphoretic.  HENT:     Head: Normocephalic.  Right Ear: Tympanic membrane, ear canal and external ear normal.     Left Ear: Tympanic membrane, ear canal and external ear normal.     Nose: Nose normal. No mucosal edema or rhinorrhea.     Mouth/Throat:     Pharynx: Uvula midline. No oropharyngeal exudate.  Eyes:     Conjunctiva/sclera: Conjunctivae normal.  Neck:     Thyroid: No thyromegaly.     Trachea: Trachea normal. No tracheal tenderness or tracheal deviation.  Cardiovascular:     Rate and Rhythm: Normal rate and regular rhythm.     Heart sounds: Normal heart sounds, S1 normal and S2 normal. No murmur.  Pulmonary:     Effort: No respiratory distress.     Breath sounds: Normal breath sounds. No stridor. No wheezing or rales.  Lymphadenopathy:     Head:     Right side of head: No tonsillar adenopathy.     Left side of head: No tonsillar adenopathy.     Cervical: No cervical adenopathy.  Skin:    Findings: No erythema or rash (Diffuse papular erythematous dermatitis across trunk and extremities.  Multiple areas of dermatographia.).     Nails: There is no clubbing.   Neurological:     Mental Status: She is alert.     Diagnostics:    Spirometry was performed and demonstrated an FEV1 of 2.33 at 104 % of predicted.  The patient had an Asthma Control Test with the following results: ACT Total Score: 25.    Assessment and Plan:   1. Asthma, mild intermittent, well-controlled   2. Other allergic rhinitis   3. LPRD (laryngopharyngeal reflux disease)   4. Inflammatory dermatosis     1. Continue omeprazole 40 mg in the morning    2. Continue Nasacort 1-2 sprays each nostril daily  3. Continue use  montelukast 10 mg daily  4. Can use antihistamine and Proair HFA if needed  5.  For inflammatory dermatosis use the following:   A.  Prednisone 10 mg -2 tablets daily x2 weeks, then 1 tablet daily x2 weeks  B.  Mometasone 0.1% ointment daily  6. Return to clinic in 12 months or earlier if problem  I suspect that [name] is having a immunologic reaction directed against Rhus like oils found in mango which is the same allergenic compound found in poison ivy.  This appears to be a very intense inflammatory dermatosis and she is going to require systemic steroids on a prolonged basis to try and get this under control.  We will initially try to get her under control with 20 mg of prednisone per day for the next 2 weeks and then taper down to a lower dose.  This will probably create havoc with her diabetes and she will need to check her blood sugars on a common basis while utilizing this anti-inflammatory agent.  She will keep in contact with me noting her response to this approach.  If she progresses in the face of utilizing this plan then she obviously will require further evaluation and treatment.  Laurette Schimke, MD Allergy / Immunology [facility]

## 2018-06-09 ENCOUNTER — Encounter: Payer: Self-pay | Admitting: Allergy and Immunology

## 2018-06-27 ENCOUNTER — Other Ambulatory Visit: Payer: Self-pay | Admitting: Allergy and Immunology

## 2018-06-29 ENCOUNTER — Other Ambulatory Visit: Payer: Self-pay | Admitting: Allergy and Immunology

## 2018-07-06 ENCOUNTER — Other Ambulatory Visit: Payer: Self-pay

## 2018-07-06 MED ORDER — OMEPRAZOLE 40 MG PO CPDR: DELAYED_RELEASE_CAPSULE | ORAL | 1 refills | Status: DC

## 2018-10-05 ENCOUNTER — Encounter (INDEPENDENT_AMBULATORY_CARE_PROVIDER_SITE_OTHER): Payer: BC Managed Care – PPO | Admitting: Ophthalmology

## 2018-10-05 ENCOUNTER — Other Ambulatory Visit: Payer: Self-pay

## 2018-10-05 DIAGNOSIS — H35033 Hypertensive retinopathy, bilateral: Secondary | ICD-10-CM | POA: Diagnosis not present

## 2018-10-05 DIAGNOSIS — I10 Essential (primary) hypertension: Secondary | ICD-10-CM

## 2018-10-05 DIAGNOSIS — H43813 Vitreous degeneration, bilateral: Secondary | ICD-10-CM | POA: Diagnosis not present

## 2018-10-05 DIAGNOSIS — H2513 Age-related nuclear cataract, bilateral: Secondary | ICD-10-CM

## 2018-10-05 DIAGNOSIS — H338 Other retinal detachments: Secondary | ICD-10-CM

## 2018-10-19 ENCOUNTER — Ambulatory Visit: Payer: BC Managed Care – PPO | Admitting: Allergy and Immunology

## 2019-01-25 ENCOUNTER — Other Ambulatory Visit: Payer: Self-pay | Admitting: Family Medicine

## 2019-01-25 DIAGNOSIS — Z1231 Encounter for screening mammogram for malignant neoplasm of breast: Secondary | ICD-10-CM

## 2019-02-02 DIAGNOSIS — M7541 Impingement syndrome of right shoulder: Secondary | ICD-10-CM | POA: Insufficient documentation

## 2019-02-03 ENCOUNTER — Other Ambulatory Visit: Payer: Self-pay | Admitting: Allergy and Immunology

## 2019-03-12 ENCOUNTER — Ambulatory Visit: Payer: BC Managed Care – PPO

## 2019-03-15 ENCOUNTER — Ambulatory Visit
Admission: RE | Admit: 2019-03-15 | Discharge: 2019-03-15 | Disposition: A | Discharge status: Home / Self Care | Payer: BC Managed Care – PPO | Source: Ambulatory Visit | Attending: Family Medicine | Admitting: Family Medicine

## 2019-03-15 ENCOUNTER — Other Ambulatory Visit: Payer: Self-pay

## 2019-03-15 DIAGNOSIS — Z1231 Encounter for screening mammogram for malignant neoplasm of breast: Secondary | ICD-10-CM

## 2019-03-19 ENCOUNTER — Emergency Department (HOSPITAL_BASED_OUTPATIENT_CLINIC_OR_DEPARTMENT_OTHER): Payer: BC Managed Care – PPO

## 2019-03-19 ENCOUNTER — Emergency Department (HOSPITAL_COMMUNITY)
Admission: EM | Admit: 2019-03-19 | Discharge: 2019-03-19 | Disposition: A | Discharge status: Home / Self Care | Payer: BC Managed Care – PPO | Attending: Emergency Medicine | Admitting: Emergency Medicine

## 2019-03-19 ENCOUNTER — Other Ambulatory Visit: Payer: Self-pay

## 2019-03-19 ENCOUNTER — Encounter (HOSPITAL_COMMUNITY): Payer: Self-pay

## 2019-03-19 ENCOUNTER — Emergency Department (HOSPITAL_COMMUNITY): Payer: BC Managed Care – PPO

## 2019-03-19 DIAGNOSIS — M7989 Other specified soft tissue disorders: Secondary | ICD-10-CM

## 2019-03-19 DIAGNOSIS — Z79899 Other long term (current) drug therapy: Secondary | ICD-10-CM | POA: Diagnosis not present

## 2019-03-19 DIAGNOSIS — M79662 Pain in left lower leg: Secondary | ICD-10-CM | POA: Insufficient documentation

## 2019-03-19 DIAGNOSIS — Z91040 Latex allergy status: Secondary | ICD-10-CM | POA: Insufficient documentation

## 2019-03-19 DIAGNOSIS — Y939 Activity, unspecified: Secondary | ICD-10-CM | POA: Insufficient documentation

## 2019-03-19 DIAGNOSIS — I10 Essential (primary) hypertension: Secondary | ICD-10-CM | POA: Insufficient documentation

## 2019-03-19 DIAGNOSIS — Y929 Unspecified place or not applicable: Secondary | ICD-10-CM | POA: Insufficient documentation

## 2019-03-19 DIAGNOSIS — J45909 Unspecified asthma, uncomplicated: Secondary | ICD-10-CM | POA: Diagnosis not present

## 2019-03-19 DIAGNOSIS — X58XXXA Exposure to other specified factors, initial encounter: Secondary | ICD-10-CM | POA: Diagnosis not present

## 2019-03-19 DIAGNOSIS — Y999 Unspecified external cause status: Secondary | ICD-10-CM | POA: Insufficient documentation

## 2019-03-19 DIAGNOSIS — S93402A Sprain of unspecified ligament of left ankle, initial encounter: Secondary | ICD-10-CM | POA: Insufficient documentation

## 2019-03-19 DIAGNOSIS — E119 Type 2 diabetes mellitus without complications: Secondary | ICD-10-CM | POA: Insufficient documentation

## 2019-03-19 DIAGNOSIS — S99912A Unspecified injury of left ankle, initial encounter: Secondary | ICD-10-CM | POA: Diagnosis present

## 2019-03-19 NOTE — ED Provider Notes (Signed)
[facility] EMERGENCY DEPARTMENT Provider Note   CSN: [id number] Arrival date & time: 03/19/19  0216     History Chief Complaint  Patient presents with  . Leg Pain    Jade Perez is a 63 y.o. female.  The history is provided by the patient. No language interpreter was used.  Leg Pain Location:  Ankle Time since incident:  2 days Injury: no   Ankle location:  L ankle Pain details:    Quality:  Aching   Radiates to:  Does not radiate   Severity:  Moderate   Onset quality:  Gradual   Timing:  Constant   Progression:  Worsening Chronicity:  New Dislocation: no   Foreign body present:  No foreign bodies Relieved by:  Nothing Worsened by:  Nothing Ineffective treatments:  None tried Risk factors: concern for non-accidental trauma   Pt reports she stepped down and had pain.  Pt reports her leg is now swollen.  Pt is worried about a blood clot      Past Medical History:  Diagnosis Date  . Anemia   . Anemia    anemic in 2010  . Anxiety   . Arthritis   . Arthritis    thumb and fingers, knee, right hip  . Asthma    takes montelukast  . Complication of anesthesia    difficulty waking up  . Complication of anesthesia    difficult to wake up   . Depression   . Diabetes mellitus   . Diabetes mellitus without complication (HCC)    type 2, diagnosed 2008  . GERD (gastroesophageal reflux disease)   . GERD (gastroesophageal reflux disease)    omeprazole and ranitidine  . Heart murmur    since birth very slight  . High cholesterol   . History of hiatal hernia    small  . History of kidney stones    december 3- January 7th  . Hypertension   . Kidney stone SEPT. 2016, DEC. 2018  . Sleep apnea    sleep study in , has but does not use CPAP, Dr. Yetta Flock has results [facility]  . Sleep apnea    does not wear cpap, doesn't know settings    Patient Active Problem List   Diagnosis Date Noted  . 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 Allergic rhinoconjunctivitis 11/29/2014  . LPRD (laryngopharyngeal reflux disease) 11/29/2014  . 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 ABDOMINAL EXPLORATION SURGERY    . APPENDECTOMY    . BREAST EXCISIONAL BIOPSY Left    benign  . CARPAL TUNNEL RELEASE Right 05/22/2017   Procedure: RIGHT CARPAL TUNNEL RELEASE;  Surgeon: Cindee Salt, MD;  Location: [facility];  Service: Orthopedics;  Laterality: Right;  . CARPAL TUNNEL RELEASE Left 03/12/2018   Procedure: LEFT CARPAL TUNNEL RELEASE;  Surgeon: Cindee Salt, MD;  Location:  SURGERY CENTER;  Service: Orthopedics;  Laterality: Left;  . cyst removed     right breast two  . DILATION AND CURETTAGE OF UTERUS    . EYE SURGERY Left    laser surgery on retina for tear-done in office  . LIPOMA EXCISION    . LIPOMA EXCISION     right shoulder  . LITHOTRIPSY  03/2017  . RETINAL DETACHMENT SURGERY Right   . SCLERAL BUCKLE  10/15/2011   Procedure: SCLERAL BUCKLE;  Surgeon: Sherrie George, MD;  Location: [facility] OR;  Service:  Ophthalmology;  Laterality: Right;  . SCLERAL BUCKLE PROCEDURE Right   . WISDOM TOOTH EXTRACTION       OB History   No obstetric history on file.     Family History  Problem Relation Age of Onset  . Allergic rhinitis Neg Hx   . Angioedema Neg Hx   . Asthma Neg Hx   . Atopy Neg Hx   . Eczema Neg Hx   . Immunodeficiency Neg Hx   . Urticaria Neg Hx     Social History   Tobacco Use  . Smoking status: Never Smoker  . Smokeless tobacco: Never Used  Substance Use Topics  . Alcohol use: No  . Drug use: No    Home Medications Prior to Admission medications   Medication Sig Start Date End Date Taking? Authorizing Provider  albuterol (PROAIR HFA) 108 (90 Base) MCG/ACT inhaler Inhale two puffs every four to six hours as needed for cough or wheeze. 10/20/17   Kozlow, Alvira Philips, MD  atorvastatin (LIPITOR) 40 MG tablet Take 40  mg by mouth daily.    [provider]  b complex vitamins tablet Take 1 tablet by mouth daily.    [provider]  citalopram (CELEXA) 40 MG tablet Take 40 mg by mouth daily.    [provider]  dapagliflozin propanediol (FARXIGA) 10 MG TABS tablet Take 10 mg by mouth daily.    [provider]  Ferrous Sulfate (SLOW FE PO) Take 1 tablet by mouth daily.    [provider]  FIBER PO Take 3 tablets by mouth daily.     [provider]  glimepiride (AMARYL) 1 MG tablet Take 0.5 mg by mouth 2 (two) times daily. 02/15/19   [provider]  loratadine (CLARITIN) 10 MG tablet Take 10 mg by mouth daily.    [provider]  losartan (COZAAR) 25 MG tablet Take 25 mg by mouth daily.    [provider]  mometasone (ELOCON) 0.1 % ointment Can apply to affected skin once daily as directed. Patient not taking: Reported on 03/18/2019 06/08/18   Jessica Priest, MD  montelukast (SINGULAIR) 10 MG tablet TAKE 1 TABLET BY MOUTH EVERY DAY Patient taking differently: Take 10 mg by mouth at bedtime.  06/29/18   Kozlow, Alvira Philips, MD  Multiple Vitamin (MULTIVITAMIN WITH MINERALS) TABS Take 1 tablet by mouth daily.    [provider]  Omega-3 Fatty Acids (FISH OIL PO) Take 1 capsule by mouth 2 (two) times daily.     [provider]  omeprazole (PRILOSEC) 40 MG capsule TAKE ONE CAPSULE EVERY MORNING BEFORE BREAKFAST Patient taking differently: Take 40 mg by mouth daily before breakfast. TAKE ONE CAPSULE EVERY MORNING BEFORE BREAKFAST 02/03/19   Kozlow, Alvira Philips, MD  OZEMPIC, 1 MG/DOSE, 2 MG/1.5ML SOPN Inject 2 mg into the skin every Thursday.  05/19/18   [provider]  predniSONE (DELTASONE) 10 MG tablet Take two tablets daily for fourteen days.  Then take one tablet daily for fourteen days. Patient not taking: Reported on 03/18/2019 06/08/18   Jessica Priest, MD  triamcinolone (NASACORT ALLERGY 24HR) 55 MCG/ACT AERO nasal  inhaler Place 2 sprays into the nose daily.    [provider]  Vitamin D, Ergocalciferol, (DRISDOL) 50000 UNITS CAPS capsule Take 50,000 Units by mouth every 14 (fourteen) days.     [provider]    Allergies    Amoxicillin, Aspirin, Biaxin [clarithromycin], Other, Percocet [oxycodone-acetaminophen], Sulfa antibiotics, Latex, and Tape  Review of Systems   Review of Systems  All other systems reviewed and are negative.   Physical Exam Updated Vital Signs BP (!) 160/72 (BP Location: Right Arm)   Pulse 75   Temp 98.2 F (36.8 C) (Oral)   Resp 16   SpO2 94%   Physical Exam Vitals and nursing note reviewed.  Constitutional:      Appearance: She is well-developed.  HENT:     Head: Normocephalic.  Cardiovascular:     Rate and Rhythm: Normal rate.  Pulmonary:     Effort: Pulmonary effort is normal.  Abdominal:     General: There is no distension.  Musculoskeletal:        General: Swelling and tenderness present. Normal range of motion.     Cervical back: Normal range of motion.  Skin:    General: Skin is warm.  Neurological:     General: No focal deficit present.     Mental Status: She is alert and oriented to person, place, and time.  Psychiatric:        Mood and Affect: Mood normal.     ED Results / Procedures / Treatments   Labs (all labs ordered are listed, but only abnormal results are displayed) Labs Reviewed - No data to display  EKG None  Radiology DG Tibia/Fibula Left  Result Date: 03/19/2019 CLINICAL DATA:  Swelling. Twisted left leg with calf pain. Ankle swelling. EXAM: LEFT TIBIA AND FIBULA - 2 VIEW COMPARISON:  None. FINDINGS: Cortical margins of the tibia and fibular intact. There is no evidence of fracture or other focal bone lesions. Plantar calcaneal spur at the ankle. Mild osteoarthritis of the knee. Mild soft tissue edema. IMPRESSION: 1. Mild soft tissue edema. 2. No acute osseous abnormality. Electronically Signed   By:  Narda Rutherford M.D.   On: 03/19/2019 03:22   VAS [place] LOWER EXTREMITY VENOUS (DVT) (ONLY [facility] & [facility])  Result Date: 03/19/2019  Lower Venous Study Indications: Swelling, and patient twisted ankle x 4 days ago.  Comparison Study: no prior Performing Technologist: Jeb Levering RDMS, RVT  Examination Guidelines: A complete evaluation includes B-mode imaging, spectral Doppler, color Doppler, and power Doppler as needed of all accessible portions of each vessel. Bilateral testing is considered an integral part of a complete examination. Limited examinations for reoccurring indications may be performed as noted.  +-----+---------------+---------+-----------+----------+--------------+ RIGHTCompressibilityPhasicitySpontaneityPropertiesThrombus Aging +-----+---------------+---------+-----------+----------+--------------+ CFV  Full           Yes      Yes                                 +-----+---------------+---------+-----------+----------+--------------+   +---------+---------------+---------+-----------+----------+--------------+ LEFT     CompressibilityPhasicitySpontaneityPropertiesThrombus Aging +---------+---------------+---------+-----------+----------+--------------+ CFV      Full           Yes      Yes                                 +---------+---------------+---------+-----------+----------+--------------+ SFJ      Full                                                        +---------+---------------+---------+-----------+----------+--------------+ FV Prox  Full                                                        +---------+---------------+---------+-----------+----------+--------------+  FV Mid   Full                                                        +---------+---------------+---------+-----------+----------+--------------+ FV DistalFull                                                         +---------+---------------+---------+-----------+----------+--------------+ PFV      Full                                                        +---------+---------------+---------+-----------+----------+--------------+ POP      Full           Yes      Yes                                 +---------+---------------+---------+-----------+----------+--------------+ PTV      Full                                                        +---------+---------------+---------+-----------+----------+--------------+ PERO     Full                                                        +---------+---------------+---------+-----------+----------+--------------+     Summary: Right: No evidence of common femoral vein obstruction. Left: There is no evidence of deep vein thrombosis in the lower extremity. A cystic structure is found in the popliteal fossa. measuring 8 cm.  *See table(s) above for measurements and observations.    Preliminary     Procedures Procedures (including critical care time)  Medications Ordered in ED Medications - No data to display  ED Course  I have reviewed the triage vital signs and the nursing notes.  Pertinent labs & imaging results that were available during my care of the patient were reviewed by me and considered in my medical decision making (see chart for details).    MDM Rules/Calculators/A&P     CHA2DS2/VAS Stroke Risk Points      N/A >= 2 Points: High Risk  1 - 1.99 Points: Medium Risk  0 Points: Low Risk    A final score could not be computed because of missing components.: Last  Change: N/A     This score determines the patient's risk of having a stroke if the  patient has atrial fibrillation.      This score is not applicable to this patient. Components are not  calculated.                   MDM xray no fracture. Doppler no dvt.  Pt advised ace wrap.  Elevate.  Follow up with primary care  Final Clinical Impression(s) / ED  Diagnoses Final diagnoses:  Mild ankle sprain, left, initial encounter    Rx / DC Orders ED Discharge Orders    None       Osie CheeksSofia, Shunta Mclaurin K, PA-C 03/19/19 [id number]    Raeford RazorKohut, Stephen, MD 03/19/19 1040

## 2019-03-19 NOTE — Progress Notes (Signed)
LLE venous duplex       has been completed. Preliminary results can be found under CV proc through chart review. Nolah Krenzer, BS, RDMS, RVT    

## 2019-03-19 NOTE — Discharge Instructions (Addendum)
Return if any problems.  See your Physician for recheck in 3-4 days  ?

## 2019-03-19 NOTE — ED Notes (Signed)
Patient verbalizes understanding of discharge instructions. Opportunity for questioning and answers were provided. Armband removed by staff, pt discharged from ED. Ambulated out to lobby  

## 2019-03-19 NOTE — ED Notes (Signed)
Patient transported to Ultrasound 

## 2019-03-19 NOTE — ED Notes (Signed)
Discharged by prev RN not taken out of system.

## 2019-03-19 NOTE — ED Triage Notes (Signed)
Pt reports that Monday she twisted her L leg and since had had calf pain, now having swelling in her ankle.

## 2019-03-19 NOTE — Patient Instructions (Signed)
DUE TO COVID-19 ONLY ONE VISITOR IS ALLOWED TO COME WITH YOU AND STAY IN THE WAITING ROOM ONLY DURING PRE OP AND PROCEDURE DAY OF SURGERY. THE 1 VISITOR MAY VISIT WITH YOU AFTER SURGERY IN YOUR PRIVATE ROOM DURING VISITING HOURS ONLY!  YOU NEED TO HAVE A COVID 19 TEST ON_______ @_______ , THIS TEST MUST BE DONE BEFORE SURGERY, COME  [address], [place] , [phone].  [facility]) ONCE YOUR COVID TEST IS COMPLETED, PLEASE BEGIN THE QUARANTINE INSTRUCTIONS AS OUTLINED IN YOUR HANDOUT.                Jade GistMary Elaine Marley Perez  03/19/2019   Your procedure is scheduled on: 03-25-19   Report to [facility] Main  Entrance   Report to short stay  at       0530 AM     Call this number if you have problems the morning of surgery [phone]    Remember: NO SOLID FOOD AFTER MIDNIGHT THE NIGHT PRIOR TO SURGERY. NOTHING BY MOUTH EXCEPT CLEAR LIQUIDS UNTIL       0430 am  . PLEASE FINISH ENSURE DRINK PER SURGEON ORDER  WHICH NEEDS TO BE COMPLETED AT       0430 am then nothing by mouth.     CLEAR LIQUID DIET   Foods Allowed                                                                     Foods Excluded  Coffee and tea, regular and decaf                             liquids that you cannot  Plain Jell-O any favor except red or purple                                           see through such as: Fruit ices (not with fruit pulp)                                     milk, soups, orange juice  Iced Popsicles                                    All solid food Carbonated beverages, regular and diet                                    Cranberry, grape and apple juices Sports drinks like Gatorade Lightly seasoned clear broth or consume(fat free) Sugar, honey syrup  _____________________________________________________________________    BRUSH YOUR TEETH MORNING OF SURGERY AND RINSE YOUR MOUTH OUT, NO CHEWING GUM CANDY OR MINTS.     Take these medicines the morning  of surgery with A SIP OF WATER: nasocort, omeprazole,loratadine, celexa, lipitor, inhaler bring with you  DO NOT TAKE ANY DIABETIC MEDICATIONS DAY OF YOUR SURGERY  How to Manage Your Diabetes Before and After Surgery  Why is it important to control my blood sugar before and after surgery? . Improving blood sugar levels before and after surgery helps healing and can limit problems. . A way of improving blood sugar control is eating a healthy diet by: o  Eating less sugar and carbohydrates o  Increasing activity/exercise o  Talking with your doctor about reaching your blood sugar goals . High blood sugars (greater than 180 mg/dL) can raise your risk of infections and slow your recovery, so you will need to focus on controlling your diabetes during the weeks before surgery. . Make sure that the doctor who takes care of your diabetes knows about your planned surgery including the date and location.  How do I manage my blood sugar before surgery? . Check your blood sugar at least 4 times a day, starting 2 days before surgery, to make sure that the level is not too high or low. o Check your blood sugar the morning of your surgery when you wake up and every 2 hours until you get to the Short Stay unit. . If your blood sugar is less than 70 mg/dL, you will need to treat for low blood sugar: o Do not take insulin. o Treat a low blood sugar (less than 70 mg/dL) with  cup of clear juice (cranberry or apple), 4 glucose tablets, OR glucose gel. o Recheck blood sugar in 15 minutes after treatment (to make sure it is greater than 70 mg/dL). If your blood sugar is not greater than 70 mg/dL on recheck, call [phone] for further instructions. . Report your blood sugar to the short stay nurse when you get to Short Stay.  . If you are admitted to the hospital after surgery: o Your blood sugar will be checked by the staff and you will probably be given insulin after surgery (instead of oral diabetes  medicines) to make sure you have good blood sugar levels. o The goal for blood sugar control after surgery is 80-180 mg/dL.   WHAT DO I DO ABOUT MY DIABETES MEDICATION?  Marland Kitchen Do not take oral diabetes medicines (pills) the morning of surgery.  . THE DAY BEFORE SURGERY TAKE ONLY AM AND LUNCH DOSES OF GLIMEPERIDE NO EVENING DOSE/ NONE DAY OF SURGERY . [place] / NONE DAY OF SURGERY       OZEMPIC NONE DAY OF SURGERY  THE MORNING OF SURGERY  . The day of surgery, do not take other diabetes injectables, including Byetta (exenatide), Bydureon (exenatide ER), Victoza (liraglutide), or Trulicity (dulaglutide).                                You may not have any metal on your body including hair pins and              piercings  Do not wear jewelry, make-up, lotions, powders or perfumes, deodorant             Do not wear nail polish on your fingernails.  Do not shave  48 hours prior to surgery.     Do not bring valuables to the hospital. [facility].  Contacts, dentures or bridgework may not be worn into surgery.    Name and phone number of your driver:  Special  Instructions: N/A              Please read over the following fact sheets you were given: _____________________________________________________________________             [facility] - Preparing for Surgery Before surgery, you can play an important role.  Because skin is not sterile, your skin needs to be as free of germs as possible.  You can reduce the number of germs on your skin by washing with CHG (chlorahexidine gluconate) soap before surgery.  CHG is an antiseptic cleaner which kills germs and bonds with the skin to continue killing germs even after washing. Please DO NOT use if you have an allergy to CHG or antibacterial soaps.  If your skin becomes reddened/irritated stop using the CHG and inform your nurse when you arrive at Short Stay. Do not shave  (including legs and underarms) for at least 48 hours prior to the first CHG shower.  You may shave your face/neck. Please follow these instructions carefully:  1.  Shower with CHG Soap the night before surgery and the  morning of Surgery.  2.  If you choose to wash your hair, wash your hair first as usual with your  normal  shampoo.  3.  After you shampoo, rinse your hair and body thoroughly to remove the  shampoo.                           4.  Use CHG as you would any other liquid soap.  You can apply chg directly  to the skin and wash                       Gently with a scrungie or clean washcloth.  5.  Apply the CHG Soap to your body ONLY FROM THE NECK DOWN.   Do not use on face/ open                           Wound or open sores. Avoid contact with eyes, ears mouth and genitals (private parts).                       Wash face,  Genitals (private parts) with your normal soap.             6.  Wash thoroughly, paying special attention to the area where your surgery  will be performed.  7.  Thoroughly rinse your body with warm water from the neck down.  8.  DO NOT shower/wash with your normal soap after using and rinsing off  the CHG Soap.                9.  Pat yourself dry with a clean towel.            10.  Wear clean pajamas.            11.  Place clean sheets on your bed the night of your first shower and do not  sleep with pets. Day of Surgery : Do not apply any lotions/deodorants the morning of surgery.  Please wear clean clothes to the hospital/surgery center.  FAILURE TO FOLLOW THESE INSTRUCTIONS MAY RESULT IN THE CANCELLATION OF YOUR SURGERY PATIENT SIGNATURE_________________________________  NURSE SIGNATURE__________________________________  ________________________________________________________________________   Rogelia Mire  An incentive spirometer is a tool that can help keep your lungs clear and active.  This tool measures how well you are filling your lungs with  each breath. Taking long deep breaths may help reverse or decrease the chance of developing breathing (pulmonary) problems (especially infection) following:  A long period of time when you are unable to move or be active. BEFORE THE PROCEDURE   If the spirometer includes an indicator to show your best effort, your nurse or respiratory therapist will set it to a desired goal.  If possible, sit up straight or lean slightly forward. Try not to slouch.  Hold the incentive spirometer in an upright position. INSTRUCTIONS FOR USE  1. Sit on the edge of your bed if possible, or sit up as far as you can in bed or on a chair. 2. Hold the incentive spirometer in an upright position. 3. Breathe out normally. 4. Place the mouthpiece in your mouth and seal your lips tightly around it. 5. Breathe in slowly and as deeply as possible, raising the piston or the ball toward the top of the column. 6. Hold your breath for 3-5 seconds or for as long as possible. Allow the piston or ball to fall to the bottom of the column. 7. Remove the mouthpiece from your mouth and breathe out normally. 8. Rest for a few seconds and repeat Steps 1 through 7 at least 10 times every 1-2 hours when you are awake. Take your time and take a few normal breaths between deep breaths. 9. The spirometer may include an indicator to show your best effort. Use the indicator as a goal to work toward during each repetition. 10. After each set of 10 deep breaths, practice coughing to be sure your lungs are clear. If you have an incision (the cut made at the time of surgery), support your incision when coughing by placing a pillow or rolled up towels firmly against it. Once you are able to get out of bed, walk around indoors and cough well. You may stop using the incentive spirometer when instructed by your caregiver.  RISKS AND COMPLICATIONS  Take your time so you do not get dizzy or light-headed.  If you are in pain, you may need to take or  ask for pain medication before doing incentive spirometry. It is harder to take a deep breath if you are having pain. AFTER USE  Rest and breathe slowly and easily.  It can be helpful to keep track of a log of your progress. Your caregiver can provide you with a simple table to help with this. If you are using the spirometer at home, follow these instructions: SEEK MEDICAL CARE IF:   You are having difficultly using the spirometer.  You have trouble using the spirometer as often as instructed.  Your pain medication is not giving enough relief while using the spirometer.  You develop fever of 100.5 F (38.1 C) or higher. SEEK IMMEDIATE MEDICAL CARE IF:   You cough up bloody sputum that had not been present before.  You develop fever of 102 F (38.9 C) or greater.  You develop worsening pain at or near the incision site. MAKE SURE YOU:   Understand these instructions.  Will watch your condition.  Will get help right away if you are not doing well or get worse. Document Released: 08/05/2006 Document Revised: 06/17/2011 Document Reviewed: 10/06/2006 [facility] Patient Information 2014 [place], [place].   ________________________________________________________________________

## 2019-03-22 ENCOUNTER — Encounter (HOSPITAL_COMMUNITY): Payer: Self-pay

## 2019-03-22 ENCOUNTER — Encounter (HOSPITAL_COMMUNITY)
Admission: RE | Admit: 2019-03-22 | Discharge: 2019-03-22 | Disposition: A | Discharge status: Home / Self Care | Payer: BC Managed Care – PPO | Source: Ambulatory Visit | Attending: Orthopedic Surgery | Admitting: Orthopedic Surgery

## 2019-03-22 ENCOUNTER — Other Ambulatory Visit (HOSPITAL_COMMUNITY)
Admission: RE | Admit: 2019-03-22 | Discharge: 2019-03-22 | Disposition: A | Discharge status: Home / Self Care | Payer: BC Managed Care – PPO | Source: Ambulatory Visit | Attending: Orthopedic Surgery | Admitting: Orthopedic Surgery

## 2019-03-22 ENCOUNTER — Other Ambulatory Visit: Payer: Self-pay

## 2019-03-22 DIAGNOSIS — Z01818 Encounter for other preprocedural examination: Secondary | ICD-10-CM | POA: Insufficient documentation

## 2019-03-22 DIAGNOSIS — M75101 Unspecified rotator cuff tear or rupture of right shoulder, not specified as traumatic: Secondary | ICD-10-CM | POA: Diagnosis not present

## 2019-03-22 DIAGNOSIS — M13811 Other specified arthritis, right shoulder: Secondary | ICD-10-CM | POA: Insufficient documentation

## 2019-03-22 DIAGNOSIS — M7541 Impingement syndrome of right shoulder: Secondary | ICD-10-CM | POA: Insufficient documentation

## 2019-03-22 DIAGNOSIS — Z20828 Contact with and (suspected) exposure to other viral communicable diseases: Secondary | ICD-10-CM | POA: Diagnosis not present

## 2019-03-22 LAB — BASIC METABOLIC PANEL
Anion gap: 12 (ref 5–15)
BUN: 16 mg/dL (ref 8–23)
CO2: 24 mmol/L (ref 22–32)
Calcium: 9.3 mg/dL (ref 8.9–10.3)
Chloride: 105 mmol/L (ref 98–111)
Creatinine, Ser: 0.77 mg/dL (ref 0.44–1.00)
GFR calc Af Amer: >60 mL/min (ref >60)
GFR calc non Af Amer: >60 mL/min (ref >60)
Glucose, Bld: 125 mg/dL — ABNORMAL HIGH (ref 70–99)
Potassium: 4 mmol/L (ref 3.5–5.1)
Sodium: 141 mmol/L (ref 135–145)

## 2019-03-22 LAB — CBC
HCT: 42.4 % (ref 36.0–46.0)
Hemoglobin: 13.6 g/dL (ref 12.0–15.0)
MCH: 30.7 pg (ref 26.0–34.0)
MCHC: 32.1 g/dL (ref 30.0–36.0)
MCV: 95.7 fL (ref 80.0–100.0)
Platelets: 287 10*3/uL (ref 150–400)
RBC: 4.43 MIL/uL (ref 3.87–5.11)
RDW: 12.7 % (ref 11.5–15.5)
WBC: 6.5 10*3/uL (ref 4.0–10.5)
nRBC: 0 % (ref 0.0–0.2)

## 2019-03-22 LAB — HEMOGLOBIN A1C
Hgb A1c MFr Bld: 6.9 % — ABNORMAL HIGH (ref 4.8–5.6)
Mean Plasma Glucose: 151.33 mg/dL

## 2019-03-22 LAB — GLUCOSE, CAPILLARY: Glucose-Capillary: 159 mg/dL — ABNORMAL HIGH (ref 70–99)

## 2019-03-22 NOTE — Progress Notes (Signed)
PCP - Marco Collie Cardiologist -   Chest x-ray -  EKG - 03-22-19 epic Stress Test -  ECHO -  Cardiac Cath -   Sleep Study -  CPAP -   Fasting Blood Sugar - 110's 120's  Checks Blood Sugar __1___ times a day  Blood Thinner Instructions: Aspirin Instructions: Last Dose:  Anesthesia review:  In ED 03-19-19 LE dopplers 03-19-19 epic , EKG  Patient denies shortness of breath, fever, cough and chest pain at PAT appointment  none   Patient verbalized understanding of instructions that were given to them at the PAT appointment. Patient was also instructed that they will need to review over the PAT instructions again at home before surgery.

## 2019-03-23 LAB — NOVEL CORONAVIRUS, NAA (HOSP ORDER, SEND-OUT TO REF LAB; TAT 18-24 HRS): SARS-CoV-2, NAA: NOT DETECTED

## 2019-03-24 ENCOUNTER — Encounter (HOSPITAL_COMMUNITY): Payer: Self-pay | Admitting: Orthopedic Surgery

## 2019-03-25 ENCOUNTER — Ambulatory Visit (HOSPITAL_COMMUNITY)
Admission: RE | Admit: 2019-03-25 | Discharge: 2019-03-25 | Disposition: A | Discharge status: Home / Self Care | Payer: BC Managed Care – PPO | Attending: Orthopedic Surgery | Admitting: Orthopedic Surgery

## 2019-03-25 ENCOUNTER — Encounter (HOSPITAL_COMMUNITY)
Admission: RE | Disposition: A | Discharge status: Home / Self Care | Payer: Self-pay | Source: Home / Self Care | Attending: Orthopedic Surgery

## 2019-03-25 ENCOUNTER — Other Ambulatory Visit: Payer: Self-pay

## 2019-03-25 ENCOUNTER — Encounter (HOSPITAL_COMMUNITY): Payer: Self-pay | Admitting: Orthopedic Surgery

## 2019-03-25 ENCOUNTER — Ambulatory Visit (HOSPITAL_COMMUNITY): Payer: BC Managed Care – PPO | Admitting: Physician Assistant

## 2019-03-25 DIAGNOSIS — Z6837 Body mass index (BMI) 37.0-37.9, adult: Secondary | ICD-10-CM | POA: Diagnosis not present

## 2019-03-25 DIAGNOSIS — I10 Essential (primary) hypertension: Secondary | ICD-10-CM | POA: Diagnosis not present

## 2019-03-25 DIAGNOSIS — G8929 Other chronic pain: Secondary | ICD-10-CM | POA: Insufficient documentation

## 2019-03-25 DIAGNOSIS — M75101 Unspecified rotator cuff tear or rupture of right shoulder, not specified as traumatic: Secondary | ICD-10-CM | POA: Diagnosis not present

## 2019-03-25 DIAGNOSIS — J45909 Unspecified asthma, uncomplicated: Secondary | ICD-10-CM | POA: Diagnosis not present

## 2019-03-25 DIAGNOSIS — F419 Anxiety disorder, unspecified: Secondary | ICD-10-CM | POA: Insufficient documentation

## 2019-03-25 DIAGNOSIS — Z79899 Other long term (current) drug therapy: Secondary | ICD-10-CM | POA: Diagnosis not present

## 2019-03-25 DIAGNOSIS — E119 Type 2 diabetes mellitus without complications: Secondary | ICD-10-CM | POA: Diagnosis not present

## 2019-03-25 DIAGNOSIS — Z87442 Personal history of urinary calculi: Secondary | ICD-10-CM | POA: Insufficient documentation

## 2019-03-25 DIAGNOSIS — M25811 Other specified joint disorders, right shoulder: Secondary | ICD-10-CM | POA: Diagnosis present

## 2019-03-25 DIAGNOSIS — G4730 Sleep apnea, unspecified: Secondary | ICD-10-CM | POA: Diagnosis not present

## 2019-03-25 DIAGNOSIS — Z7951 Long term (current) use of inhaled steroids: Secondary | ICD-10-CM | POA: Insufficient documentation

## 2019-03-25 DIAGNOSIS — E7800 Pure hypercholesterolemia, unspecified: Secondary | ICD-10-CM | POA: Insufficient documentation

## 2019-03-25 DIAGNOSIS — K219 Gastro-esophageal reflux disease without esophagitis: Secondary | ICD-10-CM | POA: Insufficient documentation

## 2019-03-25 DIAGNOSIS — M65811 Other synovitis and tenosynovitis, right shoulder: Secondary | ICD-10-CM | POA: Diagnosis not present

## 2019-03-25 DIAGNOSIS — Z7984 Long term (current) use of oral hypoglycemic drugs: Secondary | ICD-10-CM | POA: Diagnosis not present

## 2019-03-25 DIAGNOSIS — M1990 Unspecified osteoarthritis, unspecified site: Secondary | ICD-10-CM | POA: Insufficient documentation

## 2019-03-25 DIAGNOSIS — M19011 Primary osteoarthritis, right shoulder: Secondary | ICD-10-CM | POA: Diagnosis not present

## 2019-03-25 DIAGNOSIS — F329 Major depressive disorder, single episode, unspecified: Secondary | ICD-10-CM | POA: Diagnosis not present

## 2019-03-25 HISTORY — PX: SHOULDER ARTHROSCOPY WITH ROTATOR CUFF REPAIR: SHX5685

## 2019-03-25 LAB — GLUCOSE, CAPILLARY
Glucose-Capillary: 121 mg/dL — ABNORMAL HIGH (ref 70–99)
Glucose-Capillary: 140 mg/dL — ABNORMAL HIGH (ref 70–99)

## 2019-03-25 SURGERY — ARTHROSCOPY, SHOULDER, WITH ROTATOR CUFF REPAIR
Anesthesia: General | Site: Shoulder | Laterality: Right

## 2019-03-25 MED ORDER — TRAMADOL HCL 50 MG PO TABS
ORAL_TABLET | ORAL | Status: AC
  Filled 2019-03-25: qty 1

## 2019-03-25 MED ORDER — ONDANSETRON HCL 4 MG PO TABS: 4.0000 mg | ORAL_TABLET | Freq: Three times a day (TID) | ORAL | 0 refills | Status: DC | PRN

## 2019-03-25 MED ORDER — FENTANYL CITRATE (PF) 100 MCG/2ML IJ SOLN
INTRAMUSCULAR | Status: DC | PRN
  Administered 2019-03-25: 25 ug via INTRAVENOUS
  Administered 2019-03-25 (×3): 50 ug via INTRAVENOUS

## 2019-03-25 MED ORDER — TRAMADOL HCL 50 MG PO TABS: 50.0000 mg | ORAL_TABLET | Freq: Four times a day (QID) | ORAL | 0 refills | Status: DC | PRN

## 2019-03-25 MED ORDER — BUPIVACAINE HCL (PF) 0.5 % IJ SOLN
INTRAMUSCULAR | Status: DC | PRN
  Administered 2019-03-25: 15 mL via PERINEURAL

## 2019-03-25 MED ORDER — HYDROCODONE-ACETAMINOPHEN 5-325 MG PO TABS: 1.0000 | ORAL_TABLET | Freq: Four times a day (QID) | ORAL | 0 refills | Status: DC | PRN

## 2019-03-25 MED ORDER — EPHEDRINE 5 MG/ML INJ
INTRAVENOUS | Status: AC
  Filled 2019-03-25: qty 10

## 2019-03-25 MED ORDER — EPHEDRINE SULFATE 50 MG/ML IJ SOLN
INTRAMUSCULAR | Status: DC | PRN
  Administered 2019-03-25: 5 mg via INTRAVENOUS
  Administered 2019-03-25: 10 mg via INTRAVENOUS
  Administered 2019-03-25: 5 mg via INTRAVENOUS

## 2019-03-25 MED ORDER — OXYCODONE-ACETAMINOPHEN 5-325 MG PO TABS: 1.0000 | ORAL_TABLET | ORAL | 0 refills | Status: DC | PRN

## 2019-03-25 MED ORDER — ONDANSETRON HCL 4 MG/2ML IJ SOLN
4.0000 mg | Freq: Four times a day (QID) | INTRAMUSCULAR | Status: AC | PRN
  Administered 2019-03-25: 10:00:00 4 mg via INTRAVENOUS

## 2019-03-25 MED ORDER — BUPIVACAINE LIPOSOME 1.3 % IJ SUSP
INTRAMUSCULAR | Status: DC | PRN
  Administered 2019-03-25: 10 mL via PERINEURAL

## 2019-03-25 MED ORDER — PROPOFOL 10 MG/ML IV BOLUS
INTRAVENOUS | Status: AC
  Filled 2019-03-25: qty 20

## 2019-03-25 MED ORDER — DEXAMETHASONE SODIUM PHOSPHATE 4 MG/ML IJ SOLN
INTRAMUSCULAR | Status: DC | PRN
  Administered 2019-03-25: 10 mg via INTRAVENOUS

## 2019-03-25 MED ORDER — CHLORHEXIDINE GLUCONATE 4 % EX LIQD: 60.0000 mL | Freq: Once | CUTANEOUS | Status: DC

## 2019-03-25 MED ORDER — ONDANSETRON HCL 4 MG/2ML IJ SOLN
INTRAMUSCULAR | Status: DC | PRN
  Administered 2019-03-25: 4 mg via INTRAVENOUS

## 2019-03-25 MED ORDER — PHENYLEPHRINE 40 MCG/ML (10ML) SYRINGE FOR IV PUSH (FOR BLOOD PRESSURE SUPPORT)
PREFILLED_SYRINGE | INTRAVENOUS | Status: DC | PRN
  Administered 2019-03-25: 40 ug via INTRAVENOUS

## 2019-03-25 MED ORDER — PROPOFOL 10 MG/ML IV BOLUS
INTRAVENOUS | Status: DC | PRN
  Administered 2019-03-25: 50 mg via INTRAVENOUS
  Administered 2019-03-25: 150 mg via INTRAVENOUS

## 2019-03-25 MED ORDER — MIDAZOLAM HCL 5 MG/5ML IJ SOLN
INTRAMUSCULAR | Status: DC | PRN
  Administered 2019-03-25 (×2): 1 mg via INTRAVENOUS

## 2019-03-25 MED ORDER — ROCURONIUM BROMIDE 50 MG/5ML IV SOSY
PREFILLED_SYRINGE | INTRAVENOUS | Status: DC | PRN
  Administered 2019-03-25: 50 mg via INTRAVENOUS
  Administered 2019-03-25: 10 mg via INTRAVENOUS

## 2019-03-25 MED ORDER — LACTATED RINGERS IR SOLN
Status: DC | PRN
  Administered 2019-03-25 (×6): 3000 mL

## 2019-03-25 MED ORDER — LACTATED RINGERS IV SOLN: INTRAVENOUS | Status: DC

## 2019-03-25 MED ORDER — SUGAMMADEX SODIUM 200 MG/2ML IV SOLN
INTRAVENOUS | Status: DC | PRN
  Administered 2019-03-25: 200 mg via INTRAVENOUS

## 2019-03-25 MED ORDER — PHENYLEPHRINE HCL-NACL 10-0.9 MG/250ML-% IV SOLN
INTRAVENOUS | Status: AC
  Filled 2019-03-25: qty 250

## 2019-03-25 MED ORDER — ONDANSETRON HCL 4 MG/2ML IJ SOLN
INTRAMUSCULAR | Status: AC
  Filled 2019-03-25: qty 2

## 2019-03-25 MED ORDER — TRAMADOL HCL 50 MG PO TABS
50.0000 mg | ORAL_TABLET | Freq: Once | ORAL | Status: AC
  Administered 2019-03-25: 50 mg via ORAL

## 2019-03-25 MED ORDER — MIDAZOLAM HCL 2 MG/2ML IJ SOLN
INTRAMUSCULAR | Status: AC
  Filled 2019-03-25: qty 2

## 2019-03-25 MED ORDER — FENTANYL CITRATE (PF) 250 MCG/5ML IJ SOLN
INTRAMUSCULAR | Status: AC
  Filled 2019-03-25: qty 5

## 2019-03-25 MED ORDER — DEXAMETHASONE SODIUM PHOSPHATE 10 MG/ML IJ SOLN
INTRAMUSCULAR | Status: AC
  Filled 2019-03-25: qty 1

## 2019-03-25 MED ORDER — FENTANYL CITRATE (PF) 100 MCG/2ML IJ SOLN: 25.0000 ug | INTRAMUSCULAR | Status: DC | PRN

## 2019-03-25 MED ORDER — CEFAZOLIN SODIUM-DEXTROSE 2-4 GM/100ML-% IV SOLN
2.0000 g | INTRAVENOUS | Status: AC
  Administered 2019-03-25: 2 g via INTRAVENOUS
  Filled 2019-03-25: qty 100

## 2019-03-25 MED ORDER — PHENYLEPHRINE 40 MCG/ML (10ML) SYRINGE FOR IV PUSH (FOR BLOOD PRESSURE SUPPORT)
PREFILLED_SYRINGE | INTRAVENOUS | Status: AC
  Filled 2019-03-25: qty 10

## 2019-03-25 MED ORDER — ROCURONIUM BROMIDE 10 MG/ML (PF) SYRINGE
PREFILLED_SYRINGE | INTRAVENOUS | Status: AC
  Filled 2019-03-25: qty 10

## 2019-03-25 MED ORDER — CYCLOBENZAPRINE HCL 10 MG PO TABS: 10.0000 mg | ORAL_TABLET | Freq: Three times a day (TID) | ORAL | 1 refills | Status: DC | PRN

## 2019-03-25 SURGICAL SUPPLY — 68 items
ANCH SUT SWLK 19.1X4.75 VT (Anchor) ×2 IMPLANT
ANCH SUT SWLK 19.1X5.5 CLS EL (Anchor) ×1 IMPLANT
ANCH SUT SWLK 19.1X6.25 CLS (Anchor) ×1 IMPLANT
ANCHOR PEEK 4.75X19.1 SWLK C (Anchor) ×4 IMPLANT
ANCHOR PEEK SWIVEL LOCK 5.5 (Anchor) ×2 IMPLANT
ANCHOR SUT SWIVELLOK BIO (Anchor) ×2 IMPLANT
BLADE EXCALIBUR 4.0MM X 13CM (MISCELLANEOUS) ×1
BLADE EXCALIBUR 4.0X13 (MISCELLANEOUS) ×2 IMPLANT
BOOTIES KNEE HIGH SLOAN (MISCELLANEOUS) ×6 IMPLANT
BURR OVAL 8 FLU 4.0MM X 13CM (MISCELLANEOUS) ×1
BURR OVAL 8 FLU 4.0X13 (MISCELLANEOUS) ×2 IMPLANT
CANNULA ACUFLEX KIT 5X76 (CANNULA) ×3 IMPLANT
CANNULA DRILOCK 5.0MMX75MM (CANNULA) ×1
CANNULA DRILOCK 5.0X75 (CANNULA) ×1 IMPLANT
CANNULA TWIST IN 8.25X7CM (CANNULA) ×4 IMPLANT
CLOSURE STERI-STRIP 1/2X4 (GAUZE/BANDAGES/DRESSINGS) ×1
CLOSURE WOUND 1/2 X4 (GAUZE/BANDAGES/DRESSINGS) ×1
CLSR STERI-STRIP ANTIMIC 1/2X4 (GAUZE/BANDAGES/DRESSINGS) ×1 IMPLANT
CONNECTOR 5 IN 1 STRAIGHT STRL (MISCELLANEOUS) ×3 IMPLANT
COOLER ICEMAN CLASSIC (MISCELLANEOUS) ×2 IMPLANT
COVER WAND RF STERILE (DRAPES) ×3 IMPLANT
DISSECTOR  3.8MM X 13CM (MISCELLANEOUS) ×2
DISSECTOR 3.8MM X 13CM (MISCELLANEOUS) ×1 IMPLANT
DRAPE INCISE 23X17 IOBAN STRL (DRAPES) ×2
DRAPE INCISE 23X17 STRL (DRAPES) ×1 IMPLANT
DRAPE INCISE IOBAN 23X17 STRL (DRAPES) ×1 IMPLANT
DRAPE INCISE IOBAN 66X45 STRL (DRAPES) ×3 IMPLANT
DRAPE ORTHO SPLIT 77X108 STRL (DRAPES) ×6
DRAPE STERI 35X30 U-POUCH (DRAPES) ×3 IMPLANT
DRAPE SURG 17X11 SM STRL (DRAPES) ×3 IMPLANT
DRAPE SURG ORHT 6 SPLT 77X108 (DRAPES) ×2 IMPLANT
DRAPE U-SHAPE 47X51 STRL (DRAPES) ×3 IMPLANT
DRSG PAD ABDOMINAL 8X10 ST (GAUZE/BANDAGES/DRESSINGS) ×4 IMPLANT
DURAPREP 26ML APPLICATOR (WOUND CARE) ×6 IMPLANT
GAUZE SPONGE 4X4 12PLY STRL (GAUZE/BANDAGES/DRESSINGS) ×3 IMPLANT
GLOVE BIO SURGEON STRL SZ7.5 (GLOVE) ×3 IMPLANT
GLOVE BIO SURGEON STRL SZ8 (GLOVE) ×3 IMPLANT
GLOVE SS BIOGEL STRL SZ 7 (GLOVE) ×2 IMPLANT
GLOVE SS BIOGEL STRL SZ 7.5 (GLOVE) ×1 IMPLANT
GLOVE SUPERSENSE BIOGEL SZ 7 (GLOVE) ×4
GLOVE SUPERSENSE BIOGEL SZ 7.5 (GLOVE) ×2
GOWN STRL REUS W/TWL LRG LVL3 (GOWN DISPOSABLE) ×6 IMPLANT
KIT BASIN OR (CUSTOM PROCEDURE TRAY) ×3 IMPLANT
KIT SHOULDER TRACTION (DRAPES) ×3 IMPLANT
KIT TURNOVER KIT A (KITS) IMPLANT
MANIFOLD NEPTUNE II (INSTRUMENTS) ×3 IMPLANT
NDL SCORPION MULTI FIRE (NEEDLE) IMPLANT
NEEDLE SCORPION MULTI FIRE (NEEDLE) ×3 IMPLANT
NS IRRIG 1000ML POUR BTL (IV SOLUTION) ×3 IMPLANT
PACK ARTHROSCOPY DSU (CUSTOM PROCEDURE TRAY) ×3 IMPLANT
PAD ARMBOARD 7.5X6 YLW CONV (MISCELLANEOUS) ×3 IMPLANT
PAD COLD SHLDR WRAP-ON (PAD) ×2 IMPLANT
PENCIL SMOKE EVACUATOR (MISCELLANEOUS) IMPLANT
PROBE APOLLO 90XL (SURGICAL WAND) ×3 IMPLANT
SLING ARM FOAM STRAP MED (SOFTGOODS) IMPLANT
SLING ARM IMMOBILIZER MED (SOFTGOODS) ×2 IMPLANT
SPONGE LAP 4X18 RFD (DISPOSABLE) ×3 IMPLANT
STRIP CLOSURE SKIN 1/2X4 (GAUZE/BANDAGES/DRESSINGS) ×2 IMPLANT
SUT FIBERWIRE #2 38 T-5 BLUE (SUTURE)
SUT MNCRL AB 3-0 PS2 18 (SUTURE) ×3 IMPLANT
SUT PDS AB 0 CT 36 (SUTURE) IMPLANT
SUT TIGER TAPE 7 IN WHITE (SUTURE) ×5 IMPLANT
SUTURE FIBERWR #2 38 T-5 BLUE (SUTURE) IMPLANT
TAPE FIBER 2MM 7IN #2 BLUE (SUTURE) IMPLANT
TAPE PAPER 3X10 WHT MICROPORE (GAUZE/BANDAGES/DRESSINGS) ×3 IMPLANT
TOWEL OR 17X26 10 PK STRL BLUE (TOWEL DISPOSABLE) ×3 IMPLANT
TUBING ARTHROSCOPY IRRIG 16FT (MISCELLANEOUS) ×3 IMPLANT
WATER STERILE IRR 1000ML POUR (IV SOLUTION) ×3 IMPLANT

## 2019-03-25 NOTE — Discharge Instructions (Signed)
   Kevin M. Supple, M.D., F.A.A.O.S. Orthopaedic Surgery Specializing in Arthroscopic and Reconstructive Surgery of the Shoulder [phone] [address] - [place], [place] [address] - Fax [phone]  POST-OP SHOULDER ARTHROSCOPIC ROTATOR CUFF  REPAIR INSTRUCTIONS  1. Call the office at [phone] to schedule your first post-op appointment 7-10 days from the date of your surgery.  2. Leave the steri-strips in place over your incisions when performing dressing changes and showering. You may remove your dressings and begin showering 72 hours from surgery. You can expect drainage that is clear to bloody in nature that occasionally will soak through your dressings. If this occurs go ahead and perform a dressing change. The drainage should lessen daily and when there is no drainage from your incisions feel free to go without a dressing.  3. Wear your sling/immobilizer at all times except to perform the exercises below or to occasionally let your arm dangle by your side to stretch your elbow. You also need to sleep in your sling immobilizer until instructed otherwise.  4. Range of motion to your elbow, wrist, and hand are encouraged 3-5 times daily. Exercise to your hand and fingers helps to reduce swelling you may experience.  5. Utilize ice to the shoulder 3-4 times minimum a day and additionally if you are experiencing pain.  6. You may one-armed drive when safely off of narcotics and muscle relaxants. You may use your hand that is in the sling to support the steering wheel only. However, should it be your right arm that is in the sling it is not to be used for gear shifting in a manual transmission.  7. Pain control following an exparel block  To help control your post-operative pain you received a nerve block  performed with Exparel which is a long acting anesthetic (numbing agent) which can provide pain relief and sensations of numbness (and relief of pain) in the operative  shoulder and arm for up to 3 days. Sometimes it provides mixed relief, meaning you may still have numbness in certain areas of the arm but can still be able to move  parts of that arm, hand, and fingers. We recommend that your prescribed pain medications  be used as needed. We do not feel it is necessary to "pre medicate" and "stay ahead" of pain.  Taking narcotic pain medications when you are not having any pain can lead to unnecessary and potentially dangerous side effects.    8. Pain medications can produce constipation along with their use. If you experience this, the use of an over the counter stool softener or laxative daily is recommended.   9. For additional questions or concerns, please do not hesitate to call the office. If after hours there is an answering service to forward your concerns to the physician on call.  POST-OP EXERCISES  Pendulum Exercises  Perform pendulum exercises while standing and bending at the waist. Support your uninvolved arm on a table or chair and allow your operated arm to hang freely. Make sure to do these exercises passively - not using you shoulder muscle.  Repeat 20 times. Do 3 sessions per day.   

## 2019-03-25 NOTE — Anesthesia Preprocedure Evaluation (Signed)
Anesthesia Evaluation  Patient identified by MRN, date of birth, ID band Patient awake    Reviewed: Allergy & Precautions, H&P , NPO status , Patient's Chart, lab work & pertinent test results  Airway Mallampati: II   Neck ROM: full    Dental   Pulmonary asthma , sleep apnea ,    breath sounds clear to auscultation       Cardiovascular hypertension,  Rhythm:regular Rate:Normal     Neuro/Psych PSYCHIATRIC DISORDERS Anxiety Depression    GI/Hepatic hiatal hernia, GERD  ,  Endo/Other  diabetes, Type 2Morbid obesityobese  Renal/GU      Musculoskeletal  (+) Arthritis ,   Abdominal   Peds  Hematology   Anesthesia Other Findings   Reproductive/Obstetrics                             Anesthesia Physical Anesthesia Plan  ASA: III  Anesthesia Plan: General   Post-op Pain Management:  Regional for Post-op pain   Induction: Intravenous  PONV Risk Score and Plan: 3 and Ondansetron, Dexamethasone, Midazolam and Treatment may vary due to age or medical condition  Airway Management Planned: Oral ETT  Additional Equipment:   Intra-op Plan:   Post-operative Plan: Extubation in OR  Informed Consent: I have reviewed the patients History and Physical, chart, labs and discussed the procedure including the risks, benefits and alternatives for the proposed anesthesia with the patient or authorized representative who has indicated his/her understanding and acceptance.       Plan Discussed with: CRNA, Anesthesiologist and Surgeon  Anesthesia Plan Comments:         Anesthesia Quick Evaluation

## 2019-03-25 NOTE — Anesthesia Procedure Notes (Signed)
Anesthesia Regional Block: Interscalene brachial plexus block   Pre-Anesthetic Checklist: ,, timeout performed, Correct Patient, Correct Site, Correct Laterality, Correct Procedure, Correct Position, site marked, Risks and benefits discussed,  Surgical consent,  Pre-op evaluation,  At surgeon's request and post-op pain management  Laterality: Right  Prep: chloraprep       Needles:  Injection technique: Single-shot  Needle Type: Echogenic Stimulator Needle     Needle Length: 5cm  Needle Gauge: 22     Additional Needles:   Procedures:, nerve stimulator,,,,,,,   Nerve Stimulator or Paresthesia:  Response: biceps flexion, 0.45 mA,   Additional Responses:   Narrative:  Start time: 03/25/2019 7:00 AM End time: 03/25/2019 7:07 AM Injection made incrementally with aspirations every 5 mL.  Performed by: Personally  Anesthesiologist: Albertha Ghee, MD  Additional Notes: Functioning IV was confirmed and monitors were applied.  A 52mm 22ga Arrow echogenic stimulator needle was used. Sterile prep and drape,hand hygiene and sterile gloves were used.  Negative aspiration and negative test dose prior to incremental administration of local anesthetic. The patient tolerated the procedure well.  Ultrasound guidance: relevent anatomy identified, needle position confirmed, local anesthetic spread visualized around nerve(s), vascular puncture avoided.  Image printed for medical record.

## 2019-03-25 NOTE — Op Note (Signed)
03/25/2019  9:34 AM  PATIENT:   Jade Perez  63 y.o. female  PRE-OPERATIVE DIAGNOSIS:  Right shoulder impingement, acromioclavicular osteoarthritis, rotator cuff tear  POST-OPERATIVE DIAGNOSIS: Same with intraoperative finding of a previously ruptured long head bicep tendon as well as extensive degenerative labral tearing and synovitis  PROCEDURE:  1.  Right shoulder examination under anesthesia.  2.  Right shoulder glenohumeral joint diagnostic arthroscopy  3.  Debridement of degenerative labral tear, limited synovectomy  4.  Arthroscopic subacromial decompression and bursectomy  5.  Arthroscopic distal clavicle resection  6.  Arthroscopic rotator cuff repair of a large tear approximately 5 cm in width using a double row suture bridge repair construct.  SURGEON:  Marin Shutter M.D.  ASSISTANTS: Jenetta Loges, PA-C  ANESTHESIA:   General endotracheal and interscalene block with Exparel  EBL: Minimal  SPECIMEN: None  Drains: None   PATIENT DISPOSITION:  PACU - hemodynamically stable.    PLAN OF CARE: Discharge to home after PACU  Brief history:  Ms. Teryl Lucy is a 63 year old female who has had chronic and progressively increasing right shoulder pain related to an impingement syndrome and symptomatic AC joint arthritis and MRI scan demonstrating severe rotator cuff tendinosis with associated full-thickness tear.  Due to her increasing pain and functional mutations and failure to respond to conservative management she is brought to the operating room at this time for planned right shoulder arthroscopy as described below.  Preoperatively I counseled Ms. Teryl Lucy regarding treatment options as well as the potential risks versus benefits thereof.  Possible surgical complications were reviewed including bleeding, infection, neurovascular injury, persistent pain, loss of motion, anesthetic complication, recurrence of rotator cuff tear, and possible need for  additional surgery.  She understands, and accepts, and agrees with our planned procedure.  Procedure in detail:  After undergoing routine preop evaluation patient received prophylactic antibiotics and interscalene block with Exparel was established in the holding area by the anesthesia department.  Patient subsequently placed supine on the operating table and underwent the smooth induction of a general endotracheal anesthesia.  Turned to the left lateral decubitus position on the beanbag and appropriately padded and protected.  Right shoulder examination under anesthesia demonstrated full motion with no instability patterns noted.  Right arm suspended at 70 degrees of abduction with 10 pounds of traction the right shoulder girdle region was sterilely prepped and draped in standard fashion.  Timeout was called.  A posterior portal established to the glenohumeral joint and an anterior portal established under direct visualization.  The articular surfaces were found to be in good condition.  Significant synovitis anteriorly and superiorly was noted and a synovectomy was performed.  Previous rupture long head biceps tendon was noted residual stump was debrided.  Degenerative labral tearing anteriorly and superiorly was also debrided.  A large moderately retracted tear of the supra and infraspinatus and upper subscapularis was noted.  We then completed the examination of the glenohumeral joint.  Fluid and instruments removed.  The arm was then dropped down to 30 degrees of abduction and the arthroscope was then reintroduced in the subacromial space of the posterior portal and a direct lateral portal was established in the subacromial space.  Subacromial bursectomy and lysis of adhesions was completed and extensive bursal tissue was removed.  The periosteum was then removed from the undersurface of the anterior half of the acromion and a subacromial decompression was performed with a bur creating a type I morphology.   A portal was then established  directly anterior to the distal clavicle and the distal clavicle resection was performed with a bur with care taken to confirm visualization of the entire circumference of the distal clavicle to ensure adequate removal of bone.  We then completed the subacromial/subdeltoid bursectomy.  We mobilized the rotator cuff tear dissected on both the articular and bursal sides and confirmed that the margin could be brought back across the greater tuberosity.  The greater tuberosity in the upper aspect of the lesser tuberosity was then prepared removing soft tissue and abrading the bone to bleeding bed.  We then placed 2 Medial Row anchors through a stab wound off the lateral margin of the acromion and also established an accessory anterolateral portal.  The medial sutures were both loaded with suture tape such that a total of 8 suture limbs were then passed equidistant across the width of the rotator cuff tear using the scorpion suture passer.  We then placed 2 Lateral Row anchors and the suture limbs were then passed in an alternating fashion into the 2 Lateral Row anchors in our standard repair pattern.  I should mention that we did use a 625 anchor on the posterior medial row anchor and a 5 5 anchor on the posterior lateral anchor to obtain solid fixation.  The suture limbs were all then passed as described above and seated into the lateral anchors which allowed excellent reapposition of the tendon margins to the bony bed on the tuberosity and the overall construct was much to our satisfaction.  Suture limbs were all then appropriately clipped.  Final hemostasis was obtained.  Fluid and cyst removed.  The portals were closed with Monocryl and a Steri-Strip.  Dry dressing taped about the right shoulder and letter was placed in the sling immobilizer with abduction pillow.  Patient was then awakened, extubated, and taken to the recovery room in stable condition.  Ralene Bathe, PA-C was used  as an Geophysicist/field seismologist throughout this case essential for help with positioning the patient, positioning extremity, tissue manipulation, suture management, wound closure, and intraoperative decision-making.  Vania Rea Shandell Jallow MD   Contact # [phone] Same

## 2019-03-25 NOTE — H&P (Signed)
Jade Perez    Chief Complaint: Right shoulder impingement, acromioclavicular osteoarthritis, rotator cuff tear HPI: The patient is a 63 y.o. female with chronic right shoulder pain and impingement syndrome with symptoms that have been refractory to prolonged attempts at conservative management.  Past Medical History:  Diagnosis Date  . Anemia   . Anemia    anemic in 2010  . Anxiety   . Arthritis   . Arthritis    thumb and fingers, knee, right hip  . Asthma    takes montelukast  . Complication of anesthesia    difficulty waking up  . Complication of anesthesia    difficult to wake up   . Depression   . Diabetes mellitus   . Diabetes mellitus without complication ([place])    type 2, diagnosed 2008  . GERD (gastroesophageal reflux disease)   . GERD (gastroesophageal reflux disease)    omeprazole and ranitidine  . Heart murmur    since birth very slight  . High cholesterol   . History of hiatal hernia    small  . History of kidney stones    december 3- January 7th  . Hypertension   . Sleep apnea    sleep study in [place], has but does not use CPAP, Dr. Nyra Capes has results [facility]  . Sleep apnea    does not wear cpap, doesn't know settings    Past Surgical History:  Procedure Laterality Date  . ABDOMINAL EXPLORATION SURGERY    . APPENDECTOMY    . BREAST EXCISIONAL BIOPSY Left    benign  . CARPAL TUNNEL RELEASE Right 05/22/2017   Procedure: RIGHT CARPAL TUNNEL RELEASE;  Surgeon: Daryll Brod, MD;  Location: [place];  Service: Orthopedics;  Laterality: Right;  . CARPAL TUNNEL RELEASE Left 03/12/2018   Procedure: LEFT CARPAL TUNNEL RELEASE;  Surgeon: Daryll Brod, MD;  Location: [place];  Service: Orthopedics;  Laterality: Left;  . cyst removed     right breast two  . DILATION AND CURETTAGE OF UTERUS    . EYE SURGERY Left    laser surgery on retina for tear-done in office  . LIPOMA EXCISION    . LIPOMA EXCISION      right shoulder  . LITHOTRIPSY  03/2017  . RETINAL DETACHMENT SURGERY Right   . SCLERAL BUCKLE  10/15/2011   Procedure: SCLERAL BUCKLE;  Surgeon: Hayden Pedro, MD;  Location: [place];  Service: Ophthalmology;  Laterality: Right;  . SCLERAL BUCKLE PROCEDURE Right   . WISDOM TOOTH EXTRACTION      Family History  Problem Relation Age of Onset  . Allergic rhinitis Neg Hx   . Angioedema Neg Hx   . Asthma Neg Hx   . Atopy Neg Hx   . Eczema Neg Hx   . Immunodeficiency Neg Hx   . Urticaria Neg Hx     Social History:  reports that she has never smoked. She has never used smokeless tobacco. She reports that she does not drink alcohol or use drugs.   Medications Prior to Admission  Medication Sig Dispense Refill  . atorvastatin (LIPITOR) 40 MG tablet Take 40 mg by mouth daily.    . citalopram (CELEXA) 40 MG tablet Take 40 mg by mouth daily.    . dapagliflozin propanediol (FARXIGA) 10 MG TABS tablet Take 10 mg by mouth daily.    . Ferrous Sulfate (SLOW FE PO) Take 1 tablet by mouth daily.    Marland Kitchen glimepiride (AMARYL)  1 MG tablet Take 0.5 mg by mouth 2 (two) times daily.    Marland Kitchen loratadine (CLARITIN) 10 MG tablet Take 10 mg by mouth daily.    Marland Kitchen losartan (COZAAR) 25 MG tablet Take 25 mg by mouth daily.    . montelukast (SINGULAIR) 10 MG tablet TAKE 1 TABLET BY MOUTH EVERY DAY (Patient taking differently: Take 10 mg by mouth at bedtime. ) 90 tablet 3  . Multiple Vitamin (MULTIVITAMIN WITH MINERALS) TABS Take 1 tablet by mouth daily.    . Omega-3 Fatty Acids (FISH OIL PO) Take 1 capsule by mouth 2 (two) times daily.     Marland Kitchen omeprazole (PRILOSEC) 40 MG capsule TAKE ONE CAPSULE EVERY MORNING BEFORE BREAKFAST (Patient taking differently: Take 40 mg by mouth daily before breakfast. TAKE ONE CAPSULE EVERY MORNING BEFORE BREAKFAST) 90 capsule 1  . OZEMPIC, 1 MG/DOSE, 2 MG/1.5ML SOPN Inject 2 mg into the skin every Thursday.     . triamcinolone (NASACORT ALLERGY 24HR) 55 MCG/ACT AERO nasal inhaler Place 2  sprays into the nose daily.    . Vitamin D, Ergocalciferol, (DRISDOL) 50000 UNITS CAPS capsule Take 50,000 Units by mouth every 14 (fourteen) days.     Marland Kitchen albuterol (PROAIR HFA) 108 (90 Base) MCG/ACT inhaler Inhale two puffs every four to six hours as needed for cough or wheeze. 1 Inhaler 1  . b complex vitamins tablet Take 1 tablet by mouth daily.    Marland Kitchen FIBER PO Take 3 tablets by mouth daily.     . mometasone (ELOCON) 0.1 % ointment Can apply to affected skin once daily as directed. (Patient not taking: Reported on 03/18/2019) 90 g 2  . predniSONE (DELTASONE) 10 MG tablet Take two tablets daily for fourteen days.  Then take one tablet daily for fourteen days. (Patient not taking: Reported on 03/18/2019) 42 tablet 0     Physical Exam: Right shoulder demonstrates painful and guarded motion as noted at recent office visits.  Markedly positive impingement sign.  Preoperative MRI scan shows significant abnormalities within the rotator cuff consistent with severe tendinosis as well as bony impingement  Vitals  Temp:  [98.8 F (37.1 C)] 98.8 F (37.1 C) (12/17 0600) Pulse Rate:  [99] 99 (12/17 0600) Resp:  [15] 15 (12/17 0600) BP: (149)/(84) 149/84 (12/17 0600) SpO2:  [100 %] 100 % (12/17 0600) Weight:  [92.4 kg] 92.4 kg (12/17 0551)  Assessment/Plan  Impression: Right shoulder impingement, acromioclavicular osteoarthritis, rotator cuff tear  Plan of Action: Procedure(s): Right shoulder arthroscopy, subacromial decompression, distal clavicle resection, rotator cuff repair  Merna Baldi M Neftaly Inzunza 03/25/2019, 7:26 AM Contact # [phone]

## 2019-03-25 NOTE — Transfer of Care (Signed)
Immediate Anesthesia Transfer of Care Note  Patient: Florina Ou Marley Mendosa  Procedure(s) Performed: Procedure(s) with comments: Right shoulder arthroscopy, subacromial decompression, distal clavicle resection, rotator cuff repair (Right) - 156min  Patient Location: PACU  Anesthesia Type:General and Regional  Level of Consciousness: Patient easily awoken, sedated, comfortable, cooperative, following commands, responds to stimulation.   Airway & Oxygen Therapy: Patient spontaneously breathing, ventilating well, oxygen via simple oxygen mask.  Post-op Assessment: Report given to PACU RN, vital signs reviewed and stable, moving all extremities.   Post vital signs: Reviewed and stable.  Complications: No apparent anesthesia complications  Last Vitals:  Vitals Value Taken Time  BP 181/93 03/25/19 0937  Temp    Pulse 75 03/25/19 0941  Resp 17 03/25/19 0941  SpO2 96 % 03/25/19 0941  Vitals shown include unvalidated device data.  Last Pain:  Vitals:   03/25/19 0600  TempSrc: Oral  PainSc:       Patients Stated Pain Goal: 3 (91/79/15 0569)  Complications: No apparent anesthesia complications

## 2019-03-25 NOTE — Anesthesia Procedure Notes (Signed)
Procedure Name: Intubation Date/Time: 03/25/2019 7:42 AM Performed by: Deliah Boston, CRNA Pre-anesthesia Checklist: Patient identified, Emergency Drugs available, Suction available and Patient being monitored Patient Re-evaluated:Patient Re-evaluated prior to induction Oxygen Delivery Method: Circle system utilized Preoxygenation: Pre-oxygenation with 100% oxygen Induction Type: IV induction Ventilation: Mask ventilation without difficulty Laryngoscope Size: Mac and 3 Grade View: Grade I Tube type: Oral Tube size: 7.0 mm Number of attempts: 1 Airway Equipment and Method: Stylet and Oral airway Placement Confirmation: ETT inserted through vocal cords under direct vision,  positive ETCO2 and breath sounds checked- equal and bilateral Secured at: 21 cm Tube secured with: Tape Dental Injury: Teeth and Oropharynx as per pre-operative assessment

## 2019-03-26 ENCOUNTER — Encounter: Payer: Self-pay | Admitting: *Deleted

## 2019-03-26 NOTE — Anesthesia Postprocedure Evaluation (Signed)
Anesthesia Post Note  Patient: Jade Perez  Procedure(s) Performed: Right shoulder arthroscopy, subacromial decompression, distal clavicle resection, rotator cuff repair (Right Shoulder)     Patient location during evaluation: PACU Anesthesia Type: General Level of consciousness: awake and alert Pain management: pain level controlled Vital Signs Assessment: post-procedure vital signs reviewed and stable Respiratory status: spontaneous breathing, nonlabored ventilation, respiratory function stable and patient connected to nasal cannula oxygen Cardiovascular status: blood pressure returned to baseline and stable Postop Assessment: no apparent nausea or vomiting Anesthetic complications: no    Last Vitals:  Vitals:   03/25/19 1115 03/25/19 1130  BP: (!) 157/82 (!) 146/78  Pulse: 73 81  Resp: 15 (!) 26  Temp: 36.7 C   SpO2:      Last Pain:  Vitals:   03/25/19 1115  TempSrc:   PainSc: 2                  Leotha Voeltz S

## 2019-06-09 ENCOUNTER — Ambulatory Visit: Payer: Medicare PPO | Admitting: Allergy and Immunology

## 2019-06-09 ENCOUNTER — Other Ambulatory Visit: Payer: Self-pay

## 2019-06-09 ENCOUNTER — Encounter: Payer: Self-pay | Admitting: Allergy and Immunology

## 2019-06-09 VITALS — BP 120/76 | HR 68 | Temp 97.6°F | Resp 16 | Ht 61.7 in | Wt 204.8 lb

## 2019-06-09 DIAGNOSIS — J4520 Mild intermittent asthma, uncomplicated: Secondary | ICD-10-CM

## 2019-06-09 DIAGNOSIS — K219 Gastro-esophageal reflux disease without esophagitis: Secondary | ICD-10-CM

## 2019-06-09 DIAGNOSIS — J3089 Other allergic rhinitis: Secondary | ICD-10-CM | POA: Diagnosis not present

## 2019-06-09 NOTE — Progress Notes (Signed)
- [place] - [place] - [place] - [place]   Follow-up Note  Referring Provider: Abner Greenspan, MD Primary Provider: Abner Greenspan, MD Date of Office Visit: 06/09/2019  Subjective:   Jade Perez (DOB: 1955/10/18) is a 64 y.o. female who [facility] in re-evaluation of the following:  HPI: Jade Perez returns to this clinic in reevaluation of asthma and allergic rhinitis and LPR.  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  Fortunately, all of her dermatitis resolves with therapy.  She does remain away from mango.  Her asthma has been under excellent control and she has not required a systemic steroid to treat an exacerbation.  She rarely uses any short acting bronchodilator.  She continues on montelukast on a consistent basis.  She has excellent control of her upper airway disease while using Nasacort on most days of the week.  She has not required an antibiotic to treat an episode of sinusitis.  She did have a rather significant upper respiratory tract infection in January 2020 but once again did not require any antibiotic for that infection.  Her reflux is under very good control as long she consistently uses omeprazole.  She did receive the flu vaccine in 1 May during a Covid vaccine.  Allergies as of 06/09/2019      Reactions   Amoxicillin Hives   Did it involve swelling of the face/tongue/throat, SOB, or low BP? No Did it involve sudden or severe rash/hives, skin peeling, or any reaction on the inside of your mouth or nose? No Did you need to seek medical attention at a hospital or doctor's office? No When did it last happen?15-20 years ago If all above answers are "NO", may proceed with cephalosporin use.   Aspirin Swelling   Biaxin [clarithromycin]    Other    Bee Stings   Percocet  [oxycodone-acetaminophen] Itching   Sulfa Antibiotics Hives   Latex Rash   Tape Rash   & bandaids - blisters      Medication List      albuterol 108 (90 Base) MCG/ACT inhaler Commonly known as: ProAir HFA Inhale two puffs every four to six hours as needed for cough or wheeze.   atorvastatin 40 MG tablet Commonly known as: LIPITOR Take 40 mg by mouth daily.   b complex vitamins tablet Take 1 tablet by mouth daily.   citalopram 40 MG tablet Commonly known as: CELEXA Take 40 mg by mouth daily.   cyclobenzaprine 10 MG tablet Commonly known as: FLEXERIL Take 1 tablet (10 mg total) by mouth 3 (three) times daily as needed for muscle spasms.   Farxiga 10 MG Tabs tablet Generic drug: dapagliflozin propanediol Take 10 mg by mouth daily.   FIBER PO Take 3 tablets by mouth daily.   FISH OIL PO Take 1 capsule by mouth 2 (two) times daily.   glimepiride 1 MG tablet Commonly known as: AMARYL Take 0.5 mg by mouth 2 (two) times daily.   HYDROcodone-acetaminophen 5-325 MG tablet Commonly known as: Norco Take 1 tablet by mouth every 6 (six) hours as needed for moderate pain.   loratadine 10 MG tablet Commonly known as: CLARITIN Take 10 mg by mouth daily.   losartan 25 MG tablet Commonly known as: COZAAR Take 25 mg by mouth daily.   montelukast 10 MG tablet Commonly known as: SINGULAIR  TAKE 1 TABLET BY MOUTH EVERY DAY What changed: when to take this   multivitamin with minerals Tabs tablet Take 1 tablet by mouth daily.   Nasacort Allergy 24HR 55 MCG/ACT Aero nasal inhaler Generic drug: triamcinolone Place 2 sprays into the nose daily.   omeprazole 40 MG capsule Commonly known as: PRILOSEC TAKE ONE CAPSULE EVERY MORNING BEFORE BREAKFAST   ondansetron 4 MG tablet Commonly known as: Zofran Take 1 tablet (4 mg total) by mouth every 8 (eight) hours as needed for nausea or vomiting.   Ozempic (1 MG/DOSE) 2 MG/1.5ML Sopn Generic drug: Semaglutide (1 MG/DOSE) Inject  2 mg into the skin every Thursday.   SLOW FE PO Take 1 tablet by mouth daily.   traMADol 50 MG tablet Commonly known as: ULTRAM Take 1 tablet (50 mg total) by mouth every 6 (six) hours as needed for moderate pain.   Vitamin D (Ergocalciferol) 1.25 MG (50000 UNIT) Caps capsule Commonly known as: DRISDOL Take 50,000 Units by mouth every 14 (fourteen) days.       Past Medical History:  Diagnosis Date  . Anemia   . Anemia    anemic in 2010  . Anxiety   . Arthritis   . Arthritis    thumb and fingers, knee, right hip  . Asthma    takes montelukast  . Complication of anesthesia    difficulty waking up  . Complication of anesthesia    difficult to wake up   . Depression   . Diabetes mellitus   . Diabetes mellitus without complication ([place])    type 2, diagnosed 2008  . GERD (gastroesophageal reflux disease)   . GERD (gastroesophageal reflux disease)    omeprazole and ranitidine  . Heart murmur    since birth very slight  . High cholesterol   . History of hiatal hernia    small  . History of kidney stones    december 3- January 7th  . Hypertension   . Sleep apnea    sleep study in [place], has but does not use CPAP, Dr. Nyra Capes has results [facility]  . Sleep apnea    does not wear cpap, doesn't know settings    Past Surgical History:  Procedure Laterality Date  . ABDOMINAL EXPLORATION SURGERY    . APPENDECTOMY    . BREAST EXCISIONAL BIOPSY Left    benign  . CARPAL TUNNEL RELEASE Right 05/22/2017   Procedure: RIGHT CARPAL TUNNEL RELEASE;  Surgeon: Daryll Brod, MD;  Location: [place];  Service: Orthopedics;  Laterality: Right;  . CARPAL TUNNEL RELEASE Left 03/12/2018   Procedure: LEFT CARPAL TUNNEL RELEASE;  Surgeon: Daryll Brod, MD;  Location: [place];  Service: Orthopedics;  Laterality: Left;  . cyst removed     right breast two  . DILATION AND CURETTAGE OF UTERUS    . EYE SURGERY Left    laser surgery on retina  for tear-done in office  . LIPOMA EXCISION    . LIPOMA EXCISION     right shoulder  . LITHOTRIPSY  03/2017  . RETINAL DETACHMENT SURGERY Right   . SCLERAL BUCKLE  10/15/2011   Procedure: SCLERAL BUCKLE;  Surgeon: Hayden Pedro, MD;  Location: [place];  Service: Ophthalmology;  Laterality: Right;  . SCLERAL BUCKLE PROCEDURE Right   . SHOULDER ARTHROSCOPY WITH ROTATOR CUFF REPAIR Right 03/25/2019   Procedure: Right shoulder arthroscopy, subacromial decompression, distal clavicle resection, rotator cuff repair;  Surgeon: Justice Britain, MD;  [facility];  Service: Orthopedics;  Laterality: Right;   . WISDOM TOOTH EXTRACTION      Review of systems negative except as noted in HPI / PMHx or noted below:  Review of Systems  Constitutional: Negative.   HENT: Negative.   Eyes: Negative.   Respiratory: Negative.   Cardiovascular: Negative.   Gastrointestinal: Negative.   Genitourinary: Negative.   Musculoskeletal: Negative.   Skin: Negative.   Neurological: Negative.   Endo/Heme/Allergies: Negative.   Psychiatric/Behavioral: Negative.      Objective:   Vitals:   06/09/19 1101  BP: 120/76  Pulse: 68  Resp: 16  Temp: 97.6 F (36.4 C)  SpO2: 98%   Height: 5' 1.7" (156.7 cm)  Weight: 204 lb 12.8 oz (92.9 kg)   Physical Exam Constitutional:      Appearance: She is not diaphoretic.  HENT:     Head: Normocephalic.     Right Ear: Tympanic membrane, ear canal and external ear normal.     Left Ear: Tympanic membrane, ear canal and external ear normal.     Nose: Nose normal. No mucosal edema or rhinorrhea.     Mouth/Throat:     Pharynx: Uvula midline. No oropharyngeal exudate.  Eyes:     Conjunctiva/sclera: Conjunctivae normal.  Neck:     Thyroid: No thyromegaly.     Trachea: Trachea normal. No tracheal tenderness or tracheal deviation.  Cardiovascular:     Rate and Rhythm: Normal rate and regular rhythm.     Heart sounds: Normal heart sounds, S1 normal and S2  normal. No murmur.  Pulmonary:     Effort: No respiratory distress.     Breath sounds: Normal breath sounds. No stridor. No wheezing or rales.  Lymphadenopathy:     Head:     Right side of head: No tonsillar adenopathy.     Left side of head: No tonsillar adenopathy.     Cervical: No cervical adenopathy.  Skin:    Findings: No erythema or rash.     Nails: There is no clubbing.  Neurological:     Mental Status: She is alert.     Diagnostics:    Spirometry was performed and demonstrated an FEV1 of 2.43 at 110 % of predicted.  Assessment and Plan:   1. Asthma, mild intermittent, well-controlled   2. Other allergic rhinitis   3. LPRD (laryngopharyngeal reflux disease)     1. Continue omeprazole 40 mg in the morning    2. Continue Nasacort 1-2 sprays each nostril daily  3. Continue use montelukast 10 mg daily  4. Can use antihistamine and Proair HFA if needed  5.  Return to clinic in 12 months or earlier if problem  Jade Perez is doing very well on her current medical plan which includes a few anti-inflammatory agents for airway and therapy directed against reflux.  She has a very good understanding of her disease process and appropriate use of medications.  I will see her back in his clinic in 1 year or earlier if there is a problem.  Laurette Schimke, MD Allergy / Immunology [facility]

## 2019-06-09 NOTE — Patient Instructions (Addendum)
  1. Continue omeprazole 40 mg in the morning    2. Continue Nasacort 1-2 sprays each nostril daily  3. Continue use montelukast 10 mg daily  4. Can use antihistamine and Proair HFA if needed  5.  Return to clinic in 12 months or earlier if problem                

## 2019-06-10 ENCOUNTER — Encounter: Payer: Self-pay | Admitting: Allergy and Immunology

## 2019-06-10 MED ORDER — ALBUTEROL SULFATE HFA 108 (90 BASE) MCG/ACT IN AERS: INHALATION_SPRAY | RESPIRATORY_TRACT | 1 refills | Status: DC

## 2019-07-02 ENCOUNTER — Encounter: Payer: Self-pay | Admitting: Gastroenterology

## 2019-07-18 ENCOUNTER — Other Ambulatory Visit: Payer: Self-pay | Admitting: Allergy and Immunology

## 2019-08-09 ENCOUNTER — Other Ambulatory Visit: Payer: Self-pay | Admitting: Allergy and Immunology

## 2019-08-20 ENCOUNTER — Ambulatory Visit (AMBULATORY_SURGERY_CENTER): Payer: Self-pay | Admitting: *Deleted

## 2019-08-20 ENCOUNTER — Other Ambulatory Visit: Payer: Self-pay

## 2019-08-20 VITALS — Temp 97.1°F | Ht 61.7 in | Wt 204.0 lb

## 2019-08-20 DIAGNOSIS — K317 Polyp of stomach and duodenum: Secondary | ICD-10-CM

## 2019-08-20 DIAGNOSIS — Z800 Family history of malignant neoplasm of digestive organs: Secondary | ICD-10-CM

## 2019-08-20 NOTE — Progress Notes (Signed)
Patient is here in-person for PV. Patient denies any allergies to eggs or soy. Patient denies any problems with anesthesia/sedation. Hard to wake up from anesthesia pr pt. Patient denies any oxygen use at home. Patient denies taking any diet/weight loss medications or blood thinners. Patient is not being treated for MRSA or C-diff. Patient is aware of our care-partner policy and Covid-19 safety protocol. EMMI education assisgned to the patient for the procedure, this was explained and instructions given to patient.   Completed covid vaccines on 07/05/19.

## 2019-09-01 ENCOUNTER — Telehealth: Payer: Self-pay | Admitting: Gastroenterology

## 2019-09-01 NOTE — Telephone Encounter (Signed)
Very sorry to hear that RG 

## 2019-09-03 ENCOUNTER — Encounter: Payer: BC Managed Care – PPO | Admitting: Gastroenterology

## 2019-10-06 ENCOUNTER — Encounter (INDEPENDENT_AMBULATORY_CARE_PROVIDER_SITE_OTHER): Payer: Medicare PPO | Admitting: Ophthalmology

## 2019-10-06 ENCOUNTER — Other Ambulatory Visit: Payer: Self-pay

## 2019-10-06 DIAGNOSIS — H53001 Unspecified amblyopia, right eye: Secondary | ICD-10-CM

## 2019-10-06 DIAGNOSIS — H338 Other retinal detachments: Secondary | ICD-10-CM

## 2019-10-06 DIAGNOSIS — H35033 Hypertensive retinopathy, bilateral: Secondary | ICD-10-CM

## 2019-10-06 DIAGNOSIS — H33302 Unspecified retinal break, left eye: Secondary | ICD-10-CM

## 2019-10-06 DIAGNOSIS — I10 Essential (primary) hypertension: Secondary | ICD-10-CM | POA: Diagnosis not present

## 2019-10-21 DIAGNOSIS — E1169 Type 2 diabetes mellitus with other specified complication: Secondary | ICD-10-CM | POA: Diagnosis not present

## 2019-11-04 DIAGNOSIS — Z1152 Encounter for screening for COVID-19: Secondary | ICD-10-CM | POA: Diagnosis not present

## 2019-11-04 DIAGNOSIS — H9202 Otalgia, left ear: Secondary | ICD-10-CM | POA: Diagnosis not present

## 2019-11-04 DIAGNOSIS — J069 Acute upper respiratory infection, unspecified: Secondary | ICD-10-CM | POA: Diagnosis not present

## 2019-11-05 ENCOUNTER — Other Ambulatory Visit: Payer: Self-pay

## 2019-11-05 ENCOUNTER — Encounter: Payer: Self-pay | Admitting: Gastroenterology

## 2019-11-05 ENCOUNTER — Ambulatory Visit (AMBULATORY_SURGERY_CENTER): Payer: Self-pay | Admitting: *Deleted

## 2019-11-05 VITALS — Ht 61.7 in | Wt 200.0 lb

## 2019-11-05 DIAGNOSIS — Z800 Family history of malignant neoplasm of digestive organs: Secondary | ICD-10-CM

## 2019-11-05 DIAGNOSIS — K317 Polyp of stomach and duodenum: Secondary | ICD-10-CM

## 2019-11-05 NOTE — Progress Notes (Signed)
Patient is here in-person for PV. Patient denies any allergies to eggs or soy. Patient denies any problems with anesthesia/sedation. Patient denies any oxygen use at home. Patient denies taking any diet/weight loss medications or blood thinners. Patient is not being treated for MRSA or C-diff. Patient is aware of our care-partner policy and Covid-19 safety protocol.  COVID-19 vaccines completed  07/05/19. Patient had rapid covid test yesterday and it was negative.

## 2019-11-12 ENCOUNTER — Encounter: Payer: Medicare PPO | Admitting: Gastroenterology

## 2019-11-15 DIAGNOSIS — F4321 Adjustment disorder with depressed mood: Secondary | ICD-10-CM | POA: Diagnosis not present

## 2019-11-15 DIAGNOSIS — J028 Acute pharyngitis due to other specified organisms: Secondary | ICD-10-CM | POA: Diagnosis not present

## 2019-11-15 DIAGNOSIS — B9789 Other viral agents as the cause of diseases classified elsewhere: Secondary | ICD-10-CM | POA: Diagnosis not present

## 2019-11-15 DIAGNOSIS — Z6837 Body mass index (BMI) 37.0-37.9, adult: Secondary | ICD-10-CM | POA: Diagnosis not present

## 2019-12-09 DIAGNOSIS — E1169 Type 2 diabetes mellitus with other specified complication: Secondary | ICD-10-CM | POA: Diagnosis not present

## 2019-12-09 DIAGNOSIS — I129 Hypertensive chronic kidney disease with stage 1 through stage 4 chronic kidney disease, or unspecified chronic kidney disease: Secondary | ICD-10-CM | POA: Diagnosis not present

## 2019-12-09 DIAGNOSIS — E1140 Type 2 diabetes mellitus with diabetic neuropathy, unspecified: Secondary | ICD-10-CM | POA: Diagnosis not present

## 2019-12-09 DIAGNOSIS — E785 Hyperlipidemia, unspecified: Secondary | ICD-10-CM | POA: Diagnosis not present

## 2019-12-29 ENCOUNTER — Ambulatory Visit (AMBULATORY_SURGERY_CENTER): Payer: Self-pay | Admitting: *Deleted

## 2019-12-29 ENCOUNTER — Other Ambulatory Visit: Payer: Self-pay

## 2019-12-29 VITALS — Ht 61.7 in | Wt 197.0 lb

## 2019-12-29 DIAGNOSIS — Z800 Family history of malignant neoplasm of digestive organs: Secondary | ICD-10-CM

## 2019-12-29 DIAGNOSIS — K317 Polyp of stomach and duodenum: Secondary | ICD-10-CM

## 2019-12-29 NOTE — Progress Notes (Signed)
cov vax x 2   No egg or soy allergy known to patient  No issues with past sedation with any surgeries or procedures- hard to wake up post op  no intubation problems in the past  No FH of Malignant Hyperthermia No diet pills per patient No home 02 use per patient  No blood thinners per patient  Pt denies issues with constipation  No A fib or A flutter  EMMI video to pt or via MyChart  COVID 19 guidelines implemented in PV today with Pt and RN     Due to the COVID-19 pandemic we are asking patients to follow these guidelines. Please only bring one care partner. Please be aware that your care partner may wait in the car in the parking lot or if they feel like they will be too hot to wait in the car, they may wait in the lobby on the 4th floor. All care partners are required to wear a mask the entire time (we do not have any that we can provide them), they need to practice social distancing, and we will do a Covid check for all patient's and care partners when you arrive. Also we will check their temperature and your temperature. If the care partner waits in their car they need to stay in the parking lot the entire time and we will call them on their cell phone when the patient is ready for discharge so they can bring the car to the front of the building. Also all patient's will need to wear a mask into building.

## 2020-01-12 ENCOUNTER — Ambulatory Visit (AMBULATORY_SURGERY_CENTER): Payer: Medicare PPO | Admitting: Gastroenterology

## 2020-01-12 ENCOUNTER — Encounter: Payer: Self-pay | Admitting: Gastroenterology

## 2020-01-12 ENCOUNTER — Other Ambulatory Visit: Payer: Self-pay

## 2020-01-12 VITALS — BP 118/67 | HR 64 | Temp 96.8°F | Resp 12 | Ht 61.0 in | Wt 197.0 lb

## 2020-01-12 DIAGNOSIS — K2970 Gastritis, unspecified, without bleeding: Secondary | ICD-10-CM

## 2020-01-12 DIAGNOSIS — K317 Polyp of stomach and duodenum: Secondary | ICD-10-CM | POA: Diagnosis not present

## 2020-01-12 DIAGNOSIS — Z800 Family history of malignant neoplasm of digestive organs: Secondary | ICD-10-CM | POA: Diagnosis not present

## 2020-01-12 DIAGNOSIS — K449 Diaphragmatic hernia without obstruction or gangrene: Secondary | ICD-10-CM | POA: Diagnosis not present

## 2020-01-12 DIAGNOSIS — K3189 Other diseases of stomach and duodenum: Secondary | ICD-10-CM | POA: Diagnosis not present

## 2020-01-12 DIAGNOSIS — Z1211 Encounter for screening for malignant neoplasm of colon: Secondary | ICD-10-CM

## 2020-01-12 DIAGNOSIS — K219 Gastro-esophageal reflux disease without esophagitis: Secondary | ICD-10-CM

## 2020-01-12 MED ORDER — SODIUM CHLORIDE 0.9 % IV SOLN: 500.0000 mL | Freq: Once | INTRAVENOUS | Status: DC

## 2020-01-12 NOTE — Progress Notes (Signed)
Pt's states no medical or surgical changes since previsit or office visit.  Vitals- Suzanne 

## 2020-01-12 NOTE — Progress Notes (Signed)
Robinul given for vagal response during procedure. HR in 30's recovered in 60's now SR. To PACU, VSS. Report to [email or web address]

## 2020-01-12 NOTE — Patient Instructions (Signed)
YOU HAD AN ENDOSCOPIC PROCEDURE TODAY AT THE [facility]:   Refer to the procedure report that was given to you for any specific questions about what was found during the examination.  If the procedure report does not answer your questions, please call your gastroenterologist to clarify.  If you requested that your care partner not be given the details of your procedure findings, then the procedure report has been included in a sealed envelope for you to review at your convenience later.  YOU SHOULD EXPECT: Some feelings of bloating in the abdomen. Passage of more gas than usual.  Walking can help get rid of the air that was put into your GI tract during the procedure and reduce the bloating. If you had a lower endoscopy (such as a colonoscopy or flexible sigmoidoscopy) you may notice spotting of blood in your stool or on the toilet paper. If you underwent a bowel prep for your procedure, you may not have a normal bowel movement for a few days.  Please Note:  You might notice some irritation and congestion in your nose or some drainage.  This is from the oxygen used during your procedure.  There is no need for concern and it should clear up in a day or so.  SYMPTOMS TO REPORT IMMEDIATELY:   Following lower endoscopy (colonoscopy or flexible sigmoidoscopy):  Excessive amounts of blood in the stool  Significant tenderness or worsening of abdominal pains  Swelling of the abdomen that is new, acute  Fever of 100F or higher   Following upper endoscopy (EGD)  Vomiting of blood or coffee ground material  New chest pain or pain under the shoulder blades  Painful or persistently difficult swallowing  New shortness of breath  Fever of 100F or higher  Black, tarry-looking stools  For urgent or emergent issues, a gastroenterologist can be reached at any hour by calling [phone]. Do not use MyChart messaging for urgent concerns.    DIET:  We do recommend a small meal at first, but  then you may proceed to your regular diet.  Drink plenty of fluids but you should avoid alcoholic beverages for 24 hours. Follow a High Fiber Diet.  MEDICATIONS: Continue present medications. No Aspirin, Ibuprofen, Naproxen, or other non-steroidal anti-inflammatory drugs for 5 days after polyp removal.  Please see handouts given to you by your recovery nurse.   ACTIVITY:  You should plan to take it easy for the rest of today and you should NOT DRIVE or use heavy machinery until tomorrow (because of the sedation medicines used during the test).    FOLLOW UP: Our staff will call the number listed on your records 48-72 hours following your procedure to check on you and address any questions or concerns that you may have regarding the information given to you following your procedure. If we do not reach you, we will leave a message.  We will attempt to reach you two times.  During this call, we will ask if you have developed any symptoms of COVID 19. If you develop any symptoms (ie: fever, flu-like symptoms, shortness of breath, cough etc.) before then, please call [phone].  If you test positive for Covid 19 in the 2 weeks post procedure, please call and report this information to [place].    If any biopsies were taken you will be contacted by phone or by letter within the next 1-3 weeks.  Please call us at [phone] if you have not heard about the biopsies  in 3 weeks.   Thank you for allowing [place] to provide for your healthcare needs today.   SIGNATURES/CONFIDENTIALITY: You and/or your care partner have signed paperwork which will be entered into your electronic medical record.  These signatures attest to the fact that that the information above on your After Visit Summary has been reviewed and is understood.  Full responsibility of the confidentiality of this discharge information lies with you and/or your care-partner.

## 2020-01-12 NOTE — Op Note (Addendum)
[facility] Patient Name: Jade Perez Procedure Date: 01/12/2020 8:38 AM MRN: [id number] Endoscopist: Lynann Bologna , MD Age: 64 Referring MD:  Date of Birth: [birth year] Gender: Female Account #: [id number] Procedure:                Upper GI endoscopy Indications:              GERD. H/O polyps. Medicines:                Monitored Anesthesia Care Procedure:                Pre-Anesthesia Assessment:                           - Prior to the procedure, a History and Physical                            was performed, and patient medications and                            allergies were reviewed. The patient's tolerance of                            previous anesthesia was also reviewed. The risks                            and benefits of the procedure and the sedation                            options and risks were discussed with the patient.                            All questions were answered, and informed consent                            was obtained. Prior Anticoagulants: The patient has                            taken no previous anticoagulant or antiplatelet                            agents. ASA Grade Assessment: III - A patient with                            severe systemic disease. After reviewing the risks                            and benefits, the patient was deemed in                            satisfactory condition to undergo the procedure.                           - Prior to the procedure, a History and Physical  was performed, and patient medications and                            allergies were reviewed. The patient's tolerance of                            previous anesthesia was also reviewed. The risks                            and benefits of the procedure and the sedation                            options and risks were discussed with the patient.                            All questions were answered, and informed  consent                            was obtained. Prior Anticoagulants: The patient has                            taken no previous anticoagulant or antiplatelet                            agents. ASA Grade Assessment: III - A patient with                            severe systemic disease. After reviewing the risks                            and benefits, the patient was deemed in                            satisfactory condition to undergo the procedure.                           After obtaining informed consent, the endoscope was                            passed under direct vision. Throughout the                            procedure, the patient's blood pressure, pulse, and                            oxygen saturations were monitored continuously. The                            Endoscope was introduced through the mouth, and                            advanced to the second part of duodenum. The upper  GI endoscopy was accomplished without difficulty.                            The patient tolerated the procedure well. Scope In: Scope Out: Findings:                 The examined esophagus was normal.                           A small hiatal hernia was present.                           Multiple (15-20) 6 to 10 mm semi-sessile polyps                            with no bleeding and no stigmata of recent bleeding                            were found in the gastric fundus and in the gastric                            body. 3 polyps were removed with a hot snare.                            Resection and retrieval were complete.                           Localized mild inflammation characterized by                            erythema was found in the gastric antrum. Biopsies                            were taken with a cold forceps for histology.                           The examined duodenum was normal. Complications:            No immediate  complications. Estimated Blood Loss:     Estimated blood loss: none. Impression:               - Small hiatal hernia.                           - Multiple gastric polyps. Resected and retrieved.                           - Gastritis. Biopsied. Recommendation:           - Resume previous diet.                           - Continue present medications.                           - Await pathology results.                           -  No aspirin, ibuprofen, naproxen, or other                            non-steroidal anti-inflammatory drugs for 5 days                            after polyp removal.                           - The findings and recommendations were discussed                            with the patient's family. Lynann Bologna, MD 01/12/2020 9:15:14 AM This report has been signed electronically.

## 2020-01-12 NOTE — Progress Notes (Signed)
Called to room to assist during endoscopic procedure.  Patient ID and intended procedure confirmed with present staff. Received instructions for my participation in the procedure from the performing physician.  

## 2020-01-12 NOTE — Op Note (Signed)
[facility] Patient Name: Jade Perez Procedure Date: 01/12/2020 8:38 AM MRN: [id number] Endoscopist: Lynann Bologna , MD Age: 64 Referring MD:  Date of Birth: July 29, 1955 Gender: Female Account #: [id number] Procedure:                Colonoscopy Indications:              Screening in patient at increased risk: Family                            history of colorectal cancer Medicines:                Monitored Anesthesia Care Procedure:                Pre-Anesthesia Assessment:                           - Prior to the procedure, a History and Physical                            was performed, and patient medications and                            allergies were reviewed. The patient's tolerance of                            previous anesthesia was also reviewed. The risks                            and benefits of the procedure and the sedation                            options and risks were discussed with the patient.                            All questions were answered, and informed consent                            was obtained. Prior Anticoagulants: The patient has                            taken no previous anticoagulant or antiplatelet                            agents. ASA Grade Assessment: III - A patient with                            severe systemic disease. After reviewing the risks                            and benefits, the patient was deemed in                            satisfactory condition to undergo the procedure.  After obtaining informed consent, the colonoscope                            was passed under direct vision. Throughout the                            procedure, the patient's blood pressure, pulse, and                            oxygen saturations were monitored continuously. The                            Colonoscope was introduced through the anus and                            advanced to the 2 cm into the ileum.  The                            colonoscopy was performed without difficulty. The                            patient tolerated the procedure well. The quality                            of the bowel preparation was good. The terminal                            ileum, ileocecal valve, appendiceal orifice, and                            rectum were photographed. Scope In: 8:54:03 AM Scope Out: 9:04:30 AM Scope Withdrawal Time: 0 hours 7 minutes 34 seconds  Total Procedure Duration: 0 hours 10 minutes 27 seconds  Findings:                 Multiple small-mouthed diverticula were found in                            the sigmoid colon.                           Non-bleeding internal hemorrhoids were found during                            retroflexion. The hemorrhoids were small.                           The terminal ileum appeared normal.                           The exam was otherwise without abnormality on                            direct and retroflexion views. Complications:            No immediate complications. Estimated Blood  Loss:     Estimated blood loss: none. Impression:               -Moderate sigmoid diverticulosis.                           -Otherwise normal colonoscopy to TI.                           -No specimens collected. Recommendation:           - Patient has a contact number available for                            emergencies. The signs and symptoms of potential                            delayed complications were discussed with the                            patient. Return to normal activities tomorrow.                            Written discharge instructions were provided to the                            patient.                           - High fiber diet.                           - Continue present medications.                           - Repeat colonoscopy in 5 years for screening                            purposes. Earlier, if with any new problems or                             change in family history.                           - Return to GI office PRN.                           - The findings and recommendations were discussed                            with the patient's family. Lynann Bologna, MD 01/12/2020 9:10:09 AM This report has been signed electronically.

## 2020-01-14 ENCOUNTER — Telehealth: Payer: Self-pay

## 2020-01-14 NOTE — Telephone Encounter (Signed)
  Follow up Call-  Call back number 01/12/2020  Post procedure Call Back phone  # [phone]  Permission to leave phone message Yes  Some recent data might be hidden     Patient questions:  Do you have a fever, pain , or abdominal swelling? No. Pain Score  0 *  Have you tolerated food without any problems? Yes.    Have you been able to return to your normal activities? Yes.    Do you have any questions about your discharge instructions: Diet   No. Medications  No. Follow up visit  No.  Do you have questions or concerns about your Care? No.  Actions: * If pain score is 4 or above: No action needed, pain <4.  1. Have you developed a fever since your procedure? no  2.   Have you had an respiratory symptoms (SOB or cough) since your procedure? no  3.   Have you tested positive for COVID 19 since your procedure no  4.   Have you had any family members/close contacts diagnosed with the COVID 19 since your procedure?  no   If yes to any of these questions please route to Laverna Peace, RN and Karlton Lemon, RN

## 2020-01-23 ENCOUNTER — Encounter: Payer: Self-pay | Admitting: Gastroenterology

## 2020-02-07 ENCOUNTER — Other Ambulatory Visit: Payer: Self-pay | Admitting: Allergy and Immunology

## 2020-02-29 DIAGNOSIS — M654 Radial styloid tenosynovitis [de Quervain]: Secondary | ICD-10-CM | POA: Diagnosis not present

## 2020-02-29 DIAGNOSIS — Z6836 Body mass index (BMI) 36.0-36.9, adult: Secondary | ICD-10-CM | POA: Diagnosis not present

## 2020-03-01 DIAGNOSIS — D2261 Melanocytic nevi of right upper limb, including shoulder: Secondary | ICD-10-CM | POA: Diagnosis not present

## 2020-03-01 DIAGNOSIS — L820 Inflamed seborrheic keratosis: Secondary | ICD-10-CM | POA: Diagnosis not present

## 2020-03-01 DIAGNOSIS — L821 Other seborrheic keratosis: Secondary | ICD-10-CM | POA: Diagnosis not present

## 2020-04-12 DIAGNOSIS — J069 Acute upper respiratory infection, unspecified: Secondary | ICD-10-CM | POA: Diagnosis not present

## 2020-04-12 DIAGNOSIS — Z1152 Encounter for screening for COVID-19: Secondary | ICD-10-CM | POA: Diagnosis not present

## 2020-04-12 DIAGNOSIS — J029 Acute pharyngitis, unspecified: Secondary | ICD-10-CM | POA: Diagnosis not present

## 2020-04-12 DIAGNOSIS — R059 Cough, unspecified: Secondary | ICD-10-CM | POA: Diagnosis not present

## 2020-04-12 DIAGNOSIS — Z20822 Contact with and (suspected) exposure to covid-19: Secondary | ICD-10-CM | POA: Diagnosis not present

## 2020-04-18 DIAGNOSIS — E785 Hyperlipidemia, unspecified: Secondary | ICD-10-CM | POA: Diagnosis not present

## 2020-04-18 DIAGNOSIS — H25013 Cortical age-related cataract, bilateral: Secondary | ICD-10-CM | POA: Diagnosis not present

## 2020-04-18 DIAGNOSIS — Z6836 Body mass index (BMI) 36.0-36.9, adult: Secondary | ICD-10-CM | POA: Diagnosis not present

## 2020-04-18 DIAGNOSIS — Z136 Encounter for screening for cardiovascular disorders: Secondary | ICD-10-CM | POA: Diagnosis not present

## 2020-04-18 DIAGNOSIS — H524 Presbyopia: Secondary | ICD-10-CM | POA: Diagnosis not present

## 2020-04-18 DIAGNOSIS — H2513 Age-related nuclear cataract, bilateral: Secondary | ICD-10-CM | POA: Diagnosis not present

## 2020-04-18 DIAGNOSIS — Z1331 Encounter for screening for depression: Secondary | ICD-10-CM | POA: Diagnosis not present

## 2020-04-18 DIAGNOSIS — Z1339 Encounter for screening examination for other mental health and behavioral disorders: Secondary | ICD-10-CM | POA: Diagnosis not present

## 2020-04-18 DIAGNOSIS — H40013 Open angle with borderline findings, low risk, bilateral: Secondary | ICD-10-CM | POA: Diagnosis not present

## 2020-04-18 DIAGNOSIS — E1169 Type 2 diabetes mellitus with other specified complication: Secondary | ICD-10-CM | POA: Diagnosis not present

## 2020-04-18 DIAGNOSIS — Z0000 Encounter for general adult medical examination without abnormal findings: Secondary | ICD-10-CM | POA: Diagnosis not present

## 2020-04-18 DIAGNOSIS — E119 Type 2 diabetes mellitus without complications: Secondary | ICD-10-CM | POA: Diagnosis not present

## 2020-04-18 DIAGNOSIS — Z139 Encounter for screening, unspecified: Secondary | ICD-10-CM | POA: Diagnosis not present

## 2020-04-21 DIAGNOSIS — E1140 Type 2 diabetes mellitus with diabetic neuropathy, unspecified: Secondary | ICD-10-CM | POA: Diagnosis not present

## 2020-04-21 DIAGNOSIS — E1169 Type 2 diabetes mellitus with other specified complication: Secondary | ICD-10-CM | POA: Diagnosis not present

## 2020-04-21 DIAGNOSIS — E785 Hyperlipidemia, unspecified: Secondary | ICD-10-CM | POA: Diagnosis not present

## 2020-05-16 ENCOUNTER — Other Ambulatory Visit: Payer: Self-pay | Admitting: Family Medicine

## 2020-05-16 DIAGNOSIS — Z1231 Encounter for screening mammogram for malignant neoplasm of breast: Secondary | ICD-10-CM

## 2020-06-06 DIAGNOSIS — L820 Inflamed seborrheic keratosis: Secondary | ICD-10-CM | POA: Diagnosis not present

## 2020-06-08 ENCOUNTER — Ambulatory Visit: Payer: Medicare PPO | Admitting: Allergy and Immunology

## 2020-06-08 ENCOUNTER — Other Ambulatory Visit: Payer: Self-pay

## 2020-06-08 ENCOUNTER — Encounter: Payer: Self-pay | Admitting: Allergy and Immunology

## 2020-06-08 VITALS — BP 122/74 | HR 68 | Resp 14 | Ht 61.5 in | Wt 202.2 lb

## 2020-06-08 DIAGNOSIS — K219 Gastro-esophageal reflux disease without esophagitis: Secondary | ICD-10-CM

## 2020-06-08 DIAGNOSIS — J4520 Mild intermittent asthma, uncomplicated: Secondary | ICD-10-CM | POA: Diagnosis not present

## 2020-06-08 DIAGNOSIS — J3089 Other allergic rhinitis: Secondary | ICD-10-CM | POA: Diagnosis not present

## 2020-06-08 MED ORDER — ALBUTEROL SULFATE HFA 108 (90 BASE) MCG/ACT IN AERS: INHALATION_SPRAY | RESPIRATORY_TRACT | 1 refills | Status: AC

## 2020-06-08 NOTE — Progress Notes (Signed)
[place] [facility] [place] [facility] [place] [facility] [place] [facility] [place]   Follow-up Note  Referring Provider: Abner Greenspan, MD Primary Provider: Abner Greenspan, MD Date of Office Visit: 06/08/2020  Subjective:   Jade Perez (DOB: Aug 07, 1955) is a 65 y.o. female who [facility] in re-evaluation of the following:  HPI: Jade Perez returns to this clinic in evaluation of asthma and allergic rhinitis and LPR.  Her last visit to this clinic was 09 June 2019.  Her asthma has been under excellent control and she has not required a systemic steroid to treat an exacerbation and rarely uses a short acting bronchodilator and can exert herself without any problem while consistently using montelukast 1 time per day.  Her nose has really been doing quite well while intermittently using a nasal steroid.  She apparently developed a "bad cold" in December that went through the family but did not require any therapy.  Apparently she was Covid negative with that event.  Her reflux is under very good control as long as she uses omeprazole.  She recently had an upper endoscopy which identified gastric polyps.  If she misses 1 dose of omeprazole she develops abdominal pain.  She has received 3 Pfizer COVID vaccines and a flu vaccine.  Allergies as of 06/08/2020      Reactions   Amoxicillin Hives   Did it involve swelling of the face/tongue/throat, SOB, or low BP? No Did it involve sudden or severe rash/hives, skin peeling, or any reaction on the inside of your mouth or nose? No Did you need to seek medical attention at a hospital or doctor's office? No When did it last happen?15-20 years ago If all above answers are "NO", may proceed with cephalosporin use.   Aspirin Swelling   Bee Venom    Biaxin [clarithromycin]    Erythromycin Nausea Only, Other (See Comments)   Stomach pain   Other    Bee Stings   Percocet [oxycodone-acetaminophen] Itching   Sulfa  Antibiotics Hives   Latex Rash   Tape Rash   & bandaids - blisters      Medication List      Accu-Chek Aviva Plus test strip Generic drug: glucose blood 1 each daily.   albuterol 108 (90 Base) MCG/ACT inhaler Commonly known as: ProAir HFA Can inhale two puffs every four to six hours as needed for cough or wheeze.   atorvastatin 40 MG tablet Commonly known as: LIPITOR Take 40 mg by mouth daily.   b complex vitamins tablet Take 1 tablet by mouth daily.   cetirizine 10 MG tablet Commonly known as: ZYRTEC Take 10 mg by mouth daily.   citalopram 40 MG tablet Commonly known as: CELEXA Take 40 mg by mouth daily.   dapagliflozin propanediol 10 MG Tabs tablet Commonly known as: FARXIGA Take 10 mg by mouth daily.   fenofibrate 145 MG tablet Commonly known as: TRICOR Take 145 mg by mouth daily.   FIBER PO Take 3 tablets by mouth daily.   FISH OIL PO Take 1 capsule by mouth 2 (two) times daily.   glimepiride 1 MG tablet Commonly known as: AMARYL Take 0.5 mg by mouth 2 (two) times daily.   loratadine 10 MG tablet Commonly known as: CLARITIN Take 10 mg by mouth daily.   losartan 25 MG tablet Commonly known as: COZAAR Take 25 mg by mouth daily.   montelukast 10 MG tablet Commonly known as: SINGULAIR TAKE 1 TABLET BY MOUTH  EVERY DAY   multivitamin with minerals Tabs tablet Take 1 tablet by mouth daily.   omeprazole 40 MG capsule Commonly known as: PRILOSEC TAKE ONE CAPSULE EVERY MORNING BEFORE BREAKFAST   Ozempic (1 MG/DOSE) 4 MG/3ML Sopn Generic drug: Semaglutide (1 MG/DOSE) INJECT 1 MG UNDER SKIN EVERY WEEK   SLOW FE PO Take 1 tablet by mouth daily.   triamcinolone 55 MCG/ACT Aero nasal inhaler Commonly known as: NASACORT Place 2 sprays into the nose daily.   Vitamin D (Ergocalciferol) 1.25 MG (50000 UNIT) Caps capsule Commonly known as: DRISDOL Take 50,000 Units by mouth every 30 (thirty) days.       Past Medical History:  Diagnosis Date  .  Allergy   . Anemia   . Anemia    anemic in 2010  . Anxiety   . Arthritis   . Arthritis    thumb and fingers, knee, right hip  . Asthma    takes montelukast-when sick  . Cataract    forming   . Chronic kidney disease    kidney stones   . Complication of anesthesia    difficulty waking up  . Complication of anesthesia    difficult to wake up   . Depression   . Diabetes mellitus   . Diabetes mellitus without complication (HCC)    type 2, diagnosed 2008  . GERD (gastroesophageal reflux disease)   . GERD (gastroesophageal reflux disease)    omeprazole and ranitidine  . Heart murmur    since birth very slight  . High cholesterol   . History of hiatal hernia    small  . History of kidney stones    december 3- January 7th  . History of kidney stones   . Hypertension   . Sleep apnea    sleep study in [place], has but does not use CPAP, Dr. Yetta Flock has results [facility]  . Sleep apnea    does not wear cpap    Past Surgical History:  Procedure Laterality Date  . ABDOMINAL EXPLORATION SURGERY    . APPENDECTOMY    . BREAST EXCISIONAL BIOPSY Left    benign  . CARPAL TUNNEL RELEASE Right 05/22/2017   Procedure: RIGHT CARPAL TUNNEL RELEASE;  Surgeon: Cindee Salt, MD;  Location: [facility];  Service: Orthopedics;  Laterality: Right;  . CARPAL TUNNEL RELEASE Left 03/12/2018   Procedure: LEFT CARPAL TUNNEL RELEASE;  Surgeon: Cindee Salt, MD;  Location: [facility];  Service: Orthopedics;  Laterality: Left;  . COLONOSCOPY  06/04/2012  . cyst removed     right breast two  . DILATION AND CURETTAGE OF UTERUS    . EYE SURGERY Left    laser surgery on retina for tear-done in office  . LIPOMA EXCISION    . LIPOMA EXCISION     right shoulder  . LITHOTRIPSY  03/2017  . RETINAL DETACHMENT SURGERY Right   . SCLERAL BUCKLE  10/15/2011   Procedure: SCLERAL BUCKLE;  Surgeon: Sherrie George, MD;  Location: [facility] OR;  Service: Ophthalmology;  Laterality:  Right;  . SCLERAL BUCKLE PROCEDURE Right   . SHOULDER ARTHROSCOPY WITH ROTATOR CUFF REPAIR Right 03/25/2019   Procedure: Right shoulder arthroscopy, subacromial decompression, distal clavicle resection, rotator cuff repair;  Surgeon: Francena Hanly, MD;  Location: [facility];  Service: Orthopedics;  Laterality: Right;   . UPPER GASTROINTESTINAL ENDOSCOPY  05/25/2014  . WISDOM TOOTH EXTRACTION      Review of systems negative except as noted in HPI /  PMHx or noted below:  Review of Systems  Constitutional: Negative.   HENT: Negative.   Eyes: Negative.   Respiratory: Negative.   Cardiovascular: Negative.   Gastrointestinal: Negative.   Genitourinary: Negative.   Musculoskeletal: Negative.   Skin: Negative.   Neurological: Negative.   Endo/Heme/Allergies: Negative.   Psychiatric/Behavioral: Negative.      Objective:   Vitals:   06/08/20 1104  BP: 122/74  Pulse: 68  Resp: 14  SpO2: 97%   Height: 5' 1.5" (156.2 cm)  Weight: 202 lb 3.2 oz (91.7 kg)   Physical Exam Constitutional:      Appearance: She is not diaphoretic.  HENT:     Head: Normocephalic.     Right Ear: Tympanic membrane, ear canal and external ear normal.     Left Ear: Tympanic membrane, ear canal and external ear normal.     Nose: Nose normal. No mucosal edema or rhinorrhea.     Mouth/Throat:     Mouth: Oropharynx is clear and moist and mucous membranes are normal.     Pharynx: Uvula midline. No oropharyngeal exudate.  Eyes:     Conjunctiva/sclera: Conjunctivae normal.  Neck:     Thyroid: No thyromegaly.     Trachea: Trachea normal. No tracheal tenderness or tracheal deviation.  Cardiovascular:     Rate and Rhythm: Normal rate and regular rhythm.     Heart sounds: Normal heart sounds, S1 normal and S2 normal. No murmur heard.   Pulmonary:     Effort: No respiratory distress.     Breath sounds: Normal breath sounds. No stridor. No wheezing or rales.  Musculoskeletal:        General: No edema.   Lymphadenopathy:     Head:     Right side of head: No tonsillar adenopathy.     Left side of head: No tonsillar adenopathy.     Cervical: No cervical adenopathy.  Skin:    Findings: No erythema or rash.     Nails: There is no clubbing.  Neurological:     Mental Status: She is alert.     Diagnostics:    Spirometry was performed and demonstrated an FEV1 of 2.40 at 110 % of predicted.  Assessment and Plan:   1. Asthma, mild intermittent, well-controlled   2. Other allergic rhinitis   3. LPRD (laryngopharyngeal reflux disease)     1. Continue omeprazole 40 mg in the morning    2. Continue Nasacort 1-2 sprays each nostril daily  3. Continue use montelukast 10 mg daily  4. Can use antihistamine and Proair HFA if needed  5.  Return to clinic in 12 months or earlier if problem  Jade Perez is really doing very well and I refilled all her medications that are used to address her reflux and respiratory tract inflammation.  I did inform Jade Perez that there is probably no reason for her to follow-up in this clinic on a regular basis.  Will be very happy to see her should she develop a problem as she moves forward.  She can have her medications refilled by her primary care doctor in the future.  Laurette Schimke, MD Allergy / Immunology [facility]

## 2020-06-08 NOTE — Patient Instructions (Signed)
  1. Continue omeprazole 40 mg in the morning    2. Continue Nasacort 1-2 sprays each nostril daily  3. Continue use montelukast 10 mg daily  4. Can use antihistamine and Proair HFA if needed  5.  Return to clinic in 12 months or earlier if problem

## 2020-06-09 ENCOUNTER — Encounter: Payer: Self-pay | Admitting: Allergy and Immunology

## 2020-06-12 DIAGNOSIS — G4733 Obstructive sleep apnea (adult) (pediatric): Secondary | ICD-10-CM | POA: Diagnosis not present

## 2020-06-12 DIAGNOSIS — Z6837 Body mass index (BMI) 37.0-37.9, adult: Secondary | ICD-10-CM | POA: Diagnosis not present

## 2020-06-12 DIAGNOSIS — Z91018 Allergy to other foods: Secondary | ICD-10-CM | POA: Diagnosis not present

## 2020-06-19 ENCOUNTER — Other Ambulatory Visit: Payer: Self-pay | Admitting: Allergy and Immunology

## 2020-07-05 ENCOUNTER — Other Ambulatory Visit: Payer: Self-pay

## 2020-07-05 ENCOUNTER — Ambulatory Visit
Admission: RE | Admit: 2020-07-05 | Discharge: 2020-07-05 | Disposition: A | Discharge status: Home / Self Care | Payer: Medicare PPO | Source: Ambulatory Visit | Attending: Family Medicine | Admitting: Family Medicine

## 2020-07-05 DIAGNOSIS — Z1231 Encounter for screening mammogram for malignant neoplasm of breast: Secondary | ICD-10-CM

## 2020-08-04 DIAGNOSIS — E1169 Type 2 diabetes mellitus with other specified complication: Secondary | ICD-10-CM | POA: Diagnosis not present

## 2020-08-07 DIAGNOSIS — Z1211 Encounter for screening for malignant neoplasm of colon: Secondary | ICD-10-CM | POA: Diagnosis not present

## 2020-08-07 DIAGNOSIS — N952 Postmenopausal atrophic vaginitis: Secondary | ICD-10-CM | POA: Diagnosis not present

## 2020-08-07 DIAGNOSIS — Z0000 Encounter for general adult medical examination without abnormal findings: Secondary | ICD-10-CM | POA: Diagnosis not present

## 2020-08-07 DIAGNOSIS — Z01419 Encounter for gynecological examination (general) (routine) without abnormal findings: Secondary | ICD-10-CM | POA: Diagnosis not present

## 2020-08-11 DIAGNOSIS — N182 Chronic kidney disease, stage 2 (mild): Secondary | ICD-10-CM | POA: Diagnosis not present

## 2020-08-11 DIAGNOSIS — E1140 Type 2 diabetes mellitus with diabetic neuropathy, unspecified: Secondary | ICD-10-CM | POA: Diagnosis not present

## 2020-08-11 DIAGNOSIS — E785 Hyperlipidemia, unspecified: Secondary | ICD-10-CM | POA: Diagnosis not present

## 2020-08-11 DIAGNOSIS — E1169 Type 2 diabetes mellitus with other specified complication: Secondary | ICD-10-CM | POA: Diagnosis not present

## 2020-08-11 DIAGNOSIS — I129 Hypertensive chronic kidney disease with stage 1 through stage 4 chronic kidney disease, or unspecified chronic kidney disease: Secondary | ICD-10-CM | POA: Diagnosis not present

## 2020-10-12 ENCOUNTER — Encounter (INDEPENDENT_AMBULATORY_CARE_PROVIDER_SITE_OTHER): Payer: Medicare PPO | Admitting: Ophthalmology

## 2020-10-20 ENCOUNTER — Encounter (INDEPENDENT_AMBULATORY_CARE_PROVIDER_SITE_OTHER): Payer: Medicare PPO | Admitting: Ophthalmology

## 2020-10-20 ENCOUNTER — Other Ambulatory Visit: Payer: Self-pay

## 2020-10-20 DIAGNOSIS — H2513 Age-related nuclear cataract, bilateral: Secondary | ICD-10-CM | POA: Diagnosis not present

## 2020-10-20 DIAGNOSIS — H338 Other retinal detachments: Secondary | ICD-10-CM | POA: Diagnosis not present

## 2020-10-20 DIAGNOSIS — H35341 Macular cyst, hole, or pseudohole, right eye: Secondary | ICD-10-CM | POA: Diagnosis not present

## 2020-10-20 DIAGNOSIS — I10 Essential (primary) hypertension: Secondary | ICD-10-CM | POA: Diagnosis not present

## 2020-10-20 DIAGNOSIS — H35033 Hypertensive retinopathy, bilateral: Secondary | ICD-10-CM | POA: Diagnosis not present

## 2020-10-20 DIAGNOSIS — H43813 Vitreous degeneration, bilateral: Secondary | ICD-10-CM

## 2020-11-03 DIAGNOSIS — E1169 Type 2 diabetes mellitus with other specified complication: Secondary | ICD-10-CM | POA: Diagnosis not present

## 2020-11-17 DIAGNOSIS — Z789 Other specified health status: Secondary | ICD-10-CM | POA: Diagnosis not present

## 2020-11-17 DIAGNOSIS — E1140 Type 2 diabetes mellitus with diabetic neuropathy, unspecified: Secondary | ICD-10-CM | POA: Diagnosis not present

## 2020-11-17 DIAGNOSIS — E1169 Type 2 diabetes mellitus with other specified complication: Secondary | ICD-10-CM | POA: Diagnosis not present

## 2020-11-17 DIAGNOSIS — E785 Hyperlipidemia, unspecified: Secondary | ICD-10-CM | POA: Diagnosis not present

## 2020-12-07 DIAGNOSIS — Z6836 Body mass index (BMI) 36.0-36.9, adult: Secondary | ICD-10-CM | POA: Diagnosis not present

## 2020-12-07 DIAGNOSIS — E785 Hyperlipidemia, unspecified: Secondary | ICD-10-CM | POA: Diagnosis not present

## 2020-12-07 DIAGNOSIS — E1140 Type 2 diabetes mellitus with diabetic neuropathy, unspecified: Secondary | ICD-10-CM | POA: Diagnosis not present

## 2020-12-15 DIAGNOSIS — F32A Depression, unspecified: Secondary | ICD-10-CM | POA: Diagnosis not present

## 2020-12-15 DIAGNOSIS — M1990 Unspecified osteoarthritis, unspecified site: Secondary | ICD-10-CM | POA: Diagnosis not present

## 2020-12-15 DIAGNOSIS — F419 Anxiety disorder, unspecified: Secondary | ICD-10-CM | POA: Diagnosis not present

## 2020-12-15 DIAGNOSIS — I10 Essential (primary) hypertension: Secondary | ICD-10-CM | POA: Diagnosis not present

## 2020-12-15 DIAGNOSIS — E119 Type 2 diabetes mellitus without complications: Secondary | ICD-10-CM | POA: Diagnosis not present

## 2020-12-15 DIAGNOSIS — E785 Hyperlipidemia, unspecified: Secondary | ICD-10-CM | POA: Diagnosis not present

## 2020-12-15 DIAGNOSIS — J309 Allergic rhinitis, unspecified: Secondary | ICD-10-CM | POA: Diagnosis not present

## 2020-12-15 DIAGNOSIS — K219 Gastro-esophageal reflux disease without esophagitis: Secondary | ICD-10-CM | POA: Diagnosis not present

## 2021-01-06 DIAGNOSIS — I129 Hypertensive chronic kidney disease with stage 1 through stage 4 chronic kidney disease, or unspecified chronic kidney disease: Secondary | ICD-10-CM | POA: Diagnosis not present

## 2021-01-06 DIAGNOSIS — E1140 Type 2 diabetes mellitus with diabetic neuropathy, unspecified: Secondary | ICD-10-CM | POA: Diagnosis not present

## 2021-02-06 DIAGNOSIS — I129 Hypertensive chronic kidney disease with stage 1 through stage 4 chronic kidney disease, or unspecified chronic kidney disease: Secondary | ICD-10-CM | POA: Diagnosis not present

## 2021-02-06 DIAGNOSIS — E1140 Type 2 diabetes mellitus with diabetic neuropathy, unspecified: Secondary | ICD-10-CM | POA: Diagnosis not present

## 2021-03-08 DIAGNOSIS — E1140 Type 2 diabetes mellitus with diabetic neuropathy, unspecified: Secondary | ICD-10-CM | POA: Diagnosis not present

## 2021-03-08 DIAGNOSIS — I129 Hypertensive chronic kidney disease with stage 1 through stage 4 chronic kidney disease, or unspecified chronic kidney disease: Secondary | ICD-10-CM | POA: Diagnosis not present

## 2021-03-08 DIAGNOSIS — N182 Chronic kidney disease, stage 2 (mild): Secondary | ICD-10-CM | POA: Diagnosis not present

## 2021-03-16 DIAGNOSIS — E1169 Type 2 diabetes mellitus with other specified complication: Secondary | ICD-10-CM | POA: Diagnosis not present

## 2021-03-22 DIAGNOSIS — E1169 Type 2 diabetes mellitus with other specified complication: Secondary | ICD-10-CM | POA: Diagnosis not present

## 2021-03-22 DIAGNOSIS — E1140 Type 2 diabetes mellitus with diabetic neuropathy, unspecified: Secondary | ICD-10-CM | POA: Diagnosis not present

## 2021-03-22 DIAGNOSIS — E785 Hyperlipidemia, unspecified: Secondary | ICD-10-CM | POA: Diagnosis not present

## 2021-03-22 DIAGNOSIS — I129 Hypertensive chronic kidney disease with stage 1 through stage 4 chronic kidney disease, or unspecified chronic kidney disease: Secondary | ICD-10-CM | POA: Diagnosis not present

## 2021-04-08 DIAGNOSIS — E785 Hyperlipidemia, unspecified: Secondary | ICD-10-CM | POA: Diagnosis not present

## 2021-04-08 DIAGNOSIS — I129 Hypertensive chronic kidney disease with stage 1 through stage 4 chronic kidney disease, or unspecified chronic kidney disease: Secondary | ICD-10-CM | POA: Diagnosis not present

## 2021-04-08 DIAGNOSIS — E1169 Type 2 diabetes mellitus with other specified complication: Secondary | ICD-10-CM | POA: Diagnosis not present

## 2021-04-18 DIAGNOSIS — H25813 Combined forms of age-related cataract, bilateral: Secondary | ICD-10-CM | POA: Diagnosis not present

## 2021-04-18 DIAGNOSIS — H40013 Open angle with borderline findings, low risk, bilateral: Secondary | ICD-10-CM | POA: Diagnosis not present

## 2021-04-18 DIAGNOSIS — E119 Type 2 diabetes mellitus without complications: Secondary | ICD-10-CM | POA: Diagnosis not present

## 2021-04-18 DIAGNOSIS — H3554 Dystrophies primarily involving the retinal pigment epithelium: Secondary | ICD-10-CM | POA: Diagnosis not present

## 2021-04-25 ENCOUNTER — Ambulatory Visit: Payer: Medicare PPO | Admitting: Sports Medicine

## 2021-04-25 ENCOUNTER — Ambulatory Visit (INDEPENDENT_AMBULATORY_CARE_PROVIDER_SITE_OTHER): Payer: Medicare PPO

## 2021-04-25 ENCOUNTER — Other Ambulatory Visit: Payer: Self-pay

## 2021-04-25 ENCOUNTER — Encounter: Payer: Self-pay | Admitting: Sports Medicine

## 2021-04-25 DIAGNOSIS — M21619 Bunion of unspecified foot: Secondary | ICD-10-CM | POA: Diagnosis not present

## 2021-04-25 DIAGNOSIS — M2061 Acquired deformities of toe(s), unspecified, right foot: Secondary | ICD-10-CM | POA: Diagnosis not present

## 2021-04-25 DIAGNOSIS — L84 Corns and callosities: Secondary | ICD-10-CM

## 2021-04-25 DIAGNOSIS — M79671 Pain in right foot: Secondary | ICD-10-CM | POA: Diagnosis not present

## 2021-04-25 DIAGNOSIS — M2041 Other hammer toe(s) (acquired), right foot: Secondary | ICD-10-CM

## 2021-04-25 DIAGNOSIS — M25871 Other specified joint disorders, right ankle and foot: Secondary | ICD-10-CM | POA: Diagnosis not present

## 2021-04-25 DIAGNOSIS — M2042 Other hammer toe(s) (acquired), left foot: Secondary | ICD-10-CM | POA: Diagnosis not present

## 2021-04-25 NOTE — Progress Notes (Signed)
Subjective: Jade Perez is a 66 y.o. female patient who presents to office for evaluation of Right> Left bunion pain. Patient complains of progressive pain especially over the last few months but states that she has had problems with bunions and hammertoes for years but for the long this has not bothered her states that she gets some achiness at the big toe joint on the right and at the second toe joint and at the ball of the foot with some callusing.  Patient reports that she has tried using a pumice stone to send it down and wider shoes.  Patient denies any other pedal complaints at this time and does state that she is concerned about the overlapping that is also happening at her right second toe.  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     albuterol (PROAIR HFA) 108 (90 Base) MCG/ACT inhaler Can inhale two puffs every four to six hours as needed for cough or wheeze. 8 g 1   atorvastatin (LIPITOR) 40 MG tablet Take 40 mg by mouth daily.     b complex vitamins tablet Take 1 tablet by mouth daily.     cetirizine (ZYRTEC) 10 MG tablet Take 10 mg by mouth daily.     citalopram (CELEXA) 40 MG tablet Take  40 mg by mouth daily.     dapagliflozin propanediol (FARXIGA) 10 MG TABS tablet Take 10 mg by mouth daily.     fenofibrate (TRICOR) 145 MG tablet Take 145 mg by mouth daily.     Ferrous Sulfate (SLOW FE PO) Take 1 tablet by mouth daily.     FIBER PO Take 3 tablets by mouth daily.      glimepiride (AMARYL) 1 MG tablet Take 0.5 mg by mouth 2 (two) times daily.     loratadine (CLARITIN) 10 MG tablet Take 10 mg by mouth daily. (Patient not taking: Reported on 06/08/2020)     losartan (COZAAR) 25 MG tablet Take 25 mg by mouth daily.     montelukast (SINGULAIR) 10 MG tablet TAKE 1 TABLET BY MOUTH EVERY DAY 90 tablet 3   Multiple Vitamin (MULTIVITAMIN WITH MINERALS) TABS Take 1 tablet by mouth daily.     Omega-3 Fatty Acids (FISH OIL PO) Take 1 capsule by mouth 2 (two) times daily.      omeprazole (PRILOSEC) 40 MG capsule TAKE 1 CAPSULE BY MOUTH EVERY DAY IN THE MORNING BEFORE BREAKFAST 90 capsule 1   OZEMPIC, 1 MG/DOSE, 4 MG/3ML SOPN INJECT 1 MG UNDER SKIN EVERY WEEK     triamcinolone (NASACORT) 55 MCG/ACT AERO nasal inhaler Place 2 sprays into the nose daily.     Vitamin D, Ergocalciferol, (DRISDOL) 50000 UNITS CAPS capsule Take 50,000 Units by mouth every 30 (thirty)  days.      Current Facility-Administered Medications on File Prior to Visit  Medication Dose Route Frequency Provider Last Rate Last Admin   0.9 %  sodium chloride infusion  500 mL Intravenous Once Lynann BolognaGupta, Rajesh, MD        Allergies  Allergen Reactions   Amoxicillin Hives    Did it involve swelling of the face/tongue/throat, SOB, or low BP? No Did it involve sudden or severe rash/hives, skin peeling, or any reaction on the inside of your mouth or nose? No Did you need to seek medical attention at a hospital or doctor's office? No When did it last happen?      15-20 years ago If all above answers are NO, may proceed with cephalosporin use.    Aspirin Swelling   Bee Venom    Biaxin [Clarithromycin]    Erythromycin Nausea Only  and Other (See Comments)    Stomach pain   Other     Bee Stings   Percocet [Oxycodone-Acetaminophen] Itching   Sulfa Antibiotics Hives   Latex Rash   Tape Rash    & bandaids - blisters    Objective:  General: Alert and oriented x3 in no acute distress  Dermatology: No open lesions bilateral lower extremities, reactive keratosis noted at the first webspace very minimal on the right, no webspace macerations, no ecchymosis bilateral, all nails x 10 are well manicured.  Vascular: Dorsalis Pedis and Posterior Tibial pedal pulses 1/4, Capillary Fill Time 3 seconds, (+) pedal hair growth bilateral, no edema bilateral lower extremities, Temperature gradient within normal limits.  Neurology: Gross sensation intact via light touch bilateral, Protective sensation intact with Phoebe PerchSemmes Weinstein Monofilament to all pedal sites.  Musculoskeletal: Mild tenderness with palpation right>left bunion deformity, there is significant limited first MPJ range of motion bunion deformity appears to be track bound with significant midtarsal joint limited range of motion and dorsal bone spur consistent with arthritis on the right, there is significant second toe dislocation and mild pain plantar second metatarsal head.  There is significant crossover deformity noted right greater than left pes planus foot type.     Xrays  Right Foot    Impression: Intermetatarsal angle above normal limits significant for bunion deformity with joint space narrowing at the first MPJ and midtarsal joints supportive of arthritis, there is also midtarsal breech supportive of pes planus deformity there is significant lesser hammertoe deformity with dorsal dislocation at the second MPJ and digital contracture.  No fracture.  No foreign body.      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 All patient's questions answered. -Dispensed bunion padding and hammertoe regulator padding to use as instructed -Continue with good supportive shoes daily for foot type -Advised patient for additional pain relief may try topical Voltaren -Advised patient that if symptoms continue to progress may benefit from localized steroid injection or  surgery however must work on getting her A1c under 7 before we can consider surgery as an option since her deformity is severe -Patient to return to office as scheduled in 3 months for follow-up evaluation of bunion and hammertoe or sooner if condition worsens.  Asencion Islam, DPM

## 2021-04-25 NOTE — Patient Instructions (Signed)
Topical voltaren gel 1%, may purchase OTC to use as needed for pain or discomfort at [facility]  

## 2021-05-09 DIAGNOSIS — E785 Hyperlipidemia, unspecified: Secondary | ICD-10-CM | POA: Diagnosis not present

## 2021-05-09 DIAGNOSIS — I129 Hypertensive chronic kidney disease with stage 1 through stage 4 chronic kidney disease, or unspecified chronic kidney disease: Secondary | ICD-10-CM | POA: Diagnosis not present

## 2021-05-09 DIAGNOSIS — E1169 Type 2 diabetes mellitus with other specified complication: Secondary | ICD-10-CM | POA: Diagnosis not present

## 2021-05-23 DIAGNOSIS — M76822 Posterior tibial tendinitis, left leg: Secondary | ICD-10-CM | POA: Diagnosis not present

## 2021-05-23 DIAGNOSIS — Z6835 Body mass index (BMI) 35.0-35.9, adult: Secondary | ICD-10-CM | POA: Diagnosis not present

## 2021-05-29 DIAGNOSIS — G4730 Sleep apnea, unspecified: Secondary | ICD-10-CM | POA: Diagnosis not present

## 2021-06-21 DIAGNOSIS — I129 Hypertensive chronic kidney disease with stage 1 through stage 4 chronic kidney disease, or unspecified chronic kidney disease: Secondary | ICD-10-CM | POA: Diagnosis not present

## 2021-06-21 DIAGNOSIS — E1169 Type 2 diabetes mellitus with other specified complication: Secondary | ICD-10-CM | POA: Diagnosis not present

## 2021-06-28 DIAGNOSIS — E1169 Type 2 diabetes mellitus with other specified complication: Secondary | ICD-10-CM | POA: Diagnosis not present

## 2021-06-28 DIAGNOSIS — G4733 Obstructive sleep apnea (adult) (pediatric): Secondary | ICD-10-CM | POA: Diagnosis not present

## 2021-06-28 DIAGNOSIS — E785 Hyperlipidemia, unspecified: Secondary | ICD-10-CM | POA: Diagnosis not present

## 2021-06-28 DIAGNOSIS — Z1339 Encounter for screening examination for other mental health and behavioral disorders: Secondary | ICD-10-CM | POA: Diagnosis not present

## 2021-06-28 DIAGNOSIS — Z0000 Encounter for general adult medical examination without abnormal findings: Secondary | ICD-10-CM | POA: Diagnosis not present

## 2021-06-28 DIAGNOSIS — Z136 Encounter for screening for cardiovascular disorders: Secondary | ICD-10-CM | POA: Diagnosis not present

## 2021-06-28 DIAGNOSIS — Z139 Encounter for screening, unspecified: Secondary | ICD-10-CM | POA: Diagnosis not present

## 2021-06-28 DIAGNOSIS — Z1331 Encounter for screening for depression: Secondary | ICD-10-CM | POA: Diagnosis not present

## 2021-07-03 ENCOUNTER — Encounter: Payer: Self-pay | Admitting: Sports Medicine

## 2021-07-03 ENCOUNTER — Ambulatory Visit: Payer: Medicare PPO | Admitting: Sports Medicine

## 2021-07-03 ENCOUNTER — Other Ambulatory Visit: Payer: Self-pay

## 2021-07-03 DIAGNOSIS — M21619 Bunion of unspecified foot: Secondary | ICD-10-CM | POA: Diagnosis not present

## 2021-07-03 DIAGNOSIS — L84 Corns and callosities: Secondary | ICD-10-CM

## 2021-07-03 DIAGNOSIS — M2061 Acquired deformities of toe(s), unspecified, right foot: Secondary | ICD-10-CM

## 2021-07-03 DIAGNOSIS — M25871 Other specified joint disorders, right ankle and foot: Secondary | ICD-10-CM | POA: Diagnosis not present

## 2021-07-03 DIAGNOSIS — M2042 Other hammer toe(s) (acquired), left foot: Secondary | ICD-10-CM | POA: Diagnosis not present

## 2021-07-03 DIAGNOSIS — M79671 Pain in right foot: Secondary | ICD-10-CM | POA: Diagnosis not present

## 2021-07-03 DIAGNOSIS — M2041 Other hammer toe(s) (acquired), right foot: Secondary | ICD-10-CM

## 2021-07-03 MED ORDER — DICLOFENAC SODIUM 1 % EX GEL: 4.0000 g | Freq: Four times a day (QID) | CUTANEOUS | 0 refills | Status: DC

## 2021-07-03 NOTE — Progress Notes (Signed)
Subjective: ?Jade Perez is a 66 y.o. female patient who presents to office for evaluation of Right> Left bunion and hammertoe pain.  Patient reports that she tried to wear the cushions we provided last visit but the more she wore them actually the more painful it became so she discontinued using them and states that she forgot to try the Voltaren gel states that the pain is not severe but she is concerned about the position of her toes. ? ?Patient is diabetic with last A1c of now down to 6.8 since being on Ozempic. ? ?Patient Active Problem List  ? Diagnosis Date Noted  ? Impingement syndrome of right shoulder region 02/02/2019  ? Bilateral carpal tunnel syndrome 02/07/2017  ? Bilateral hand pain 02/07/2017  ? Neck pain 02/07/2017  ? Primary osteoarthritis of both first carpometacarpal joints 02/07/2017  ? Hallux abducto valgus, bilateral 09/09/2016  ? Intrinsic asthma 11/29/2014  ? Allergic rhinoconjunctivitis 11/29/2014  ? LPRD (laryngopharyngeal reflux disease) 11/29/2014  ? 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 Type 2 diabetes mellitus without complication, without long-term current use of insulin (HCC) 04/09/2007  ? ? ?Current Outpatient Medications on File Prior to Visit  ?Medication Sig Dispense Refill  ? ACCU-CHEK AVIVA PLUS test strip 1 each daily.    ? albuterol (PROAIR HFA) 108 (90 Base) MCG/ACT inhaler Can inhale two puffs every four to six hours as needed for cough or wheeze. 8 g 1  ? atorvastatin (LIPITOR) 40 MG tablet Take 40 mg by mouth daily.    ? b complex vitamins tablet Take 1 tablet by mouth daily.    ? cetirizine (ZYRTEC) 10 MG tablet Take 10 mg by mouth daily.    ? citalopram (CELEXA) 40 MG tablet Take 40 mg by mouth daily.    ? dapagliflozin propanediol (FARXIGA) 10 MG TABS tablet Take 10 mg by mouth daily.    ? fenofibrate (TRICOR) 145 MG tablet Take 145 mg by mouth daily.    ? Ferrous Sulfate (SLOW FE PO) Take 1 tablet by mouth daily.    ? FIBER PO Take 3 tablets by mouth  daily.     ? glimepiride (AMARYL) 1 MG tablet Take 0.5 mg by mouth 2 (two) times daily.    ? loratadine (CLARITIN) 10 MG tablet Take 10 mg by mouth daily. (Patient not taking: Reported on 06/08/2020)    ? losartan (COZAAR) 25 MG tablet Take 25 mg by mouth daily.    ? montelukast (SINGULAIR) 10 MG tablet TAKE 1 TABLET BY MOUTH EVERY DAY 90 tablet 3  ? Multiple Vitamin (MULTIVITAMIN WITH MINERALS) TABS Take 1 tablet by mouth daily.    ? Omega-3 Fatty Acids (FISH OIL PO) Take 1 capsule by mouth 2 (two) times daily.     ? omeprazole (PRILOSEC) 40 MG capsule TAKE 1 CAPSULE BY MOUTH EVERY DAY IN THE MORNING BEFORE BREAKFAST 90 capsule 1  ? OZEMPIC, 1 MG/DOSE, 4 MG/3ML SOPN INJECT 1 MG UNDER SKIN EVERY WEEK    ? triamcinolone (NASACORT) 55 MCG/ACT AERO nasal inhaler Place 2 sprays into the nose daily.    ? Vitamin D, Ergocalciferol, (DRISDOL) 50000 UNITS CAPS capsule Take 50,000 Units by mouth every 30 (thirty) days.     ? ?Current Facility-Administered Medications on File Prior to Visit  ?Medication Dose Route Frequency Provider Last Rate Last Admin  ? 0.9 %  sodium chloride infusion  500 mL Intravenous Once Lynann Bologna, MD      ? ? ?Allergies  ?Allergen Reactions  ?  Amoxicillin Hives  ?  Did it involve swelling of the face/tongue/throat, SOB, or low BP? No ?Did it involve sudden or severe rash/hives, skin peeling, or any reaction on the inside of your mouth or nose? No ?Did you need to seek medical attention at a hospital or doctor's office? No ?When did it last happen?      15-20 years ago ?If all above answers are ?NO?, may proceed with cephalosporin use. ?  ? Aspirin Swelling  ? Bee Venom   ? Biaxin [Clarithromycin]   ? Erythromycin Nausea Only and Other (See Comments)  ?  Stomach pain  ? Other   ?  Bee Stings  ? Percocet [Oxycodone-Acetaminophen] Itching  ? Sulfa Antibiotics Hives  ? Latex Rash  ? Tape Rash  ?  & bandaids - blisters  ? ? ?Objective:  ?General: Alert and oriented x3 in no acute  distress ? ?Dermatology: No open lesions bilateral lower extremities, reactive keratosis noted at the first webspace very minimal on the right and at the medial hallux right greater than left, no webspace macerations, no ecchymosis bilateral, all nails x 10 are well manicured. ? ?Vascular: Dorsalis Pedis and Posterior Tibial pedal pulses 1/4, Capillary Fill Time 3 seconds, (+) pedal hair growth bilateral, no edema bilateral lower extremities, Temperature gradient within normal limits. ? ?Neurology: Gross sensation intact via light touch bilateral, Protective sensation intact with Phoebe Perch Monofilament to all pedal sites. ? ?Musculoskeletal: Mild tenderness with palpation right>left bunion deformity, there is significant limited first MPJ range of motion bunion deformity appears to be track bound with significant midtarsal joint limited range of motion and dorsal bone spur consistent with arthritis on the right, there is significant second toe dislocation and minimal plantar second metatarsal head.  There is significant crossover deformity noted right greater than left pes planus foot type.   ? ?     ?Assessment and Plan: ?Problem List Items Addressed This Visit   ?None ?Visit Diagnoses   ? ? Acquired deformity of right toe    -  Primary  ? Bunion      ? Hammer toes of both feet      ? 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 Callus      ? Right foot pain      ? ?  ?  ? ?-Complete examination performed ?-Re-Discussed treatement options; discussed HAV and dislocated hammertoe deformity;conservative and  Surgical management; risks, benefits, alternatives discussed. All patient's questions answered. ?-Since patient did not tolerate hammertoe regulator padding and bunion padding advised patient to continue with shoes that provide support ?-Educated patient that likely deformity will continue to progress and that if it becomes more painful should consider localized injection versus  surgery however at this point patient states that her life is too hectic right now for any surgery and it does not hurt her that bad ?-Advised patient also meanwhile to try topical Voltaren up which she forgot to get last visit ?-Return to office if symptoms fail to continue to improve or worsen. ? ?Asencion Islam, DPM  ?

## 2021-07-03 NOTE — Patient Instructions (Signed)
Topical voltaren gel 1%, may purchase OTC to use as needed for pain or discomfort at [facility]  

## 2021-07-05 ENCOUNTER — Other Ambulatory Visit: Payer: Self-pay | Admitting: Family Medicine

## 2021-07-05 DIAGNOSIS — Z1231 Encounter for screening mammogram for malignant neoplasm of breast: Secondary | ICD-10-CM

## 2021-07-07 DIAGNOSIS — E785 Hyperlipidemia, unspecified: Secondary | ICD-10-CM | POA: Diagnosis not present

## 2021-07-25 ENCOUNTER — Ambulatory Visit
Admission: RE | Admit: 2021-07-25 | Discharge: 2021-07-25 | Disposition: A | Discharge status: Home / Self Care | Payer: Medicare PPO | Source: Ambulatory Visit | Attending: Family Medicine | Admitting: Family Medicine

## 2021-07-25 DIAGNOSIS — Z1231 Encounter for screening mammogram for malignant neoplasm of breast: Secondary | ICD-10-CM

## 2021-07-30 ENCOUNTER — Other Ambulatory Visit: Payer: Self-pay | Admitting: Allergy and Immunology

## 2021-08-06 DIAGNOSIS — E785 Hyperlipidemia, unspecified: Secondary | ICD-10-CM | POA: Diagnosis not present

## 2021-08-14 DIAGNOSIS — Z23 Encounter for immunization: Secondary | ICD-10-CM | POA: Diagnosis not present

## 2021-08-14 DIAGNOSIS — E785 Hyperlipidemia, unspecified: Secondary | ICD-10-CM | POA: Diagnosis not present

## 2021-08-14 DIAGNOSIS — Z1211 Encounter for screening for malignant neoplasm of colon: Secondary | ICD-10-CM | POA: Diagnosis not present

## 2021-08-14 DIAGNOSIS — B3731 Acute candidiasis of vulva and vagina: Secondary | ICD-10-CM | POA: Diagnosis not present

## 2021-08-14 DIAGNOSIS — E1169 Type 2 diabetes mellitus with other specified complication: Secondary | ICD-10-CM | POA: Diagnosis not present

## 2021-08-14 DIAGNOSIS — Z0000 Encounter for general adult medical examination without abnormal findings: Secondary | ICD-10-CM | POA: Diagnosis not present

## 2021-08-25 ENCOUNTER — Other Ambulatory Visit: Payer: Self-pay | Admitting: Allergy and Immunology

## 2021-09-06 DIAGNOSIS — E785 Hyperlipidemia, unspecified: Secondary | ICD-10-CM | POA: Diagnosis not present

## 2021-10-19 ENCOUNTER — Encounter (INDEPENDENT_AMBULATORY_CARE_PROVIDER_SITE_OTHER): Payer: Medicare PPO | Admitting: Ophthalmology

## 2021-10-19 DIAGNOSIS — H35033 Hypertensive retinopathy, bilateral: Secondary | ICD-10-CM | POA: Diagnosis not present

## 2021-10-19 DIAGNOSIS — H43813 Vitreous degeneration, bilateral: Secondary | ICD-10-CM

## 2021-10-19 DIAGNOSIS — H338 Other retinal detachments: Secondary | ICD-10-CM

## 2021-10-19 DIAGNOSIS — I10 Essential (primary) hypertension: Secondary | ICD-10-CM | POA: Diagnosis not present

## 2021-10-19 DIAGNOSIS — H35341 Macular cyst, hole, or pseudohole, right eye: Secondary | ICD-10-CM | POA: Diagnosis not present

## 2021-10-19 DIAGNOSIS — H33302 Unspecified retinal break, left eye: Secondary | ICD-10-CM | POA: Diagnosis not present

## 2021-10-25 DIAGNOSIS — E1169 Type 2 diabetes mellitus with other specified complication: Secondary | ICD-10-CM | POA: Diagnosis not present

## 2021-11-01 DIAGNOSIS — Z6836 Body mass index (BMI) 36.0-36.9, adult: Secondary | ICD-10-CM | POA: Diagnosis not present

## 2021-11-01 DIAGNOSIS — E785 Hyperlipidemia, unspecified: Secondary | ICD-10-CM | POA: Diagnosis not present

## 2021-11-01 DIAGNOSIS — N182 Chronic kidney disease, stage 2 (mild): Secondary | ICD-10-CM | POA: Diagnosis not present

## 2021-11-01 DIAGNOSIS — E1169 Type 2 diabetes mellitus with other specified complication: Secondary | ICD-10-CM | POA: Diagnosis not present

## 2021-11-01 DIAGNOSIS — I129 Hypertensive chronic kidney disease with stage 1 through stage 4 chronic kidney disease, or unspecified chronic kidney disease: Secondary | ICD-10-CM | POA: Diagnosis not present

## 2021-11-06 DIAGNOSIS — E785 Hyperlipidemia, unspecified: Secondary | ICD-10-CM | POA: Diagnosis not present

## 2021-12-06 ENCOUNTER — Encounter (HOSPITAL_BASED_OUTPATIENT_CLINIC_OR_DEPARTMENT_OTHER): Payer: Self-pay

## 2021-12-06 ENCOUNTER — Emergency Department (HOSPITAL_BASED_OUTPATIENT_CLINIC_OR_DEPARTMENT_OTHER): Payer: Medicare PPO

## 2021-12-06 ENCOUNTER — Other Ambulatory Visit: Payer: Self-pay

## 2021-12-06 ENCOUNTER — Encounter (HOSPITAL_COMMUNITY): Payer: Self-pay

## 2021-12-06 ENCOUNTER — Inpatient Hospital Stay (HOSPITAL_BASED_OUTPATIENT_CLINIC_OR_DEPARTMENT_OTHER)
Admission: EM | Admit: 2021-12-06 | Discharge: 2021-12-15 | DRG: 988 | Disposition: A | Discharge status: Home / Self Care | Payer: Medicare PPO | Attending: Internal Medicine | Admitting: Internal Medicine

## 2021-12-06 DIAGNOSIS — I5181 Takotsubo syndrome: Secondary | ICD-10-CM | POA: Diagnosis present

## 2021-12-06 DIAGNOSIS — Z20822 Contact with and (suspected) exposure to covid-19: Secondary | ICD-10-CM | POA: Diagnosis not present

## 2021-12-06 DIAGNOSIS — K219 Gastro-esophageal reflux disease without esophagitis: Secondary | ICD-10-CM | POA: Diagnosis present

## 2021-12-06 DIAGNOSIS — I252 Old myocardial infarction: Secondary | ICD-10-CM

## 2021-12-06 DIAGNOSIS — Z880 Allergy status to penicillin: Secondary | ICD-10-CM

## 2021-12-06 DIAGNOSIS — J45909 Unspecified asthma, uncomplicated: Secondary | ICD-10-CM | POA: Diagnosis present

## 2021-12-06 DIAGNOSIS — E669 Obesity, unspecified: Secondary | ICD-10-CM | POA: Diagnosis present

## 2021-12-06 DIAGNOSIS — I10 Essential (primary) hypertension: Secondary | ICD-10-CM | POA: Diagnosis present

## 2021-12-06 DIAGNOSIS — Z91040 Latex allergy status: Secondary | ICD-10-CM

## 2021-12-06 DIAGNOSIS — K449 Diaphragmatic hernia without obstruction or gangrene: Secondary | ICD-10-CM | POA: Diagnosis not present

## 2021-12-06 DIAGNOSIS — N200 Calculus of kidney: Principal | ICD-10-CM

## 2021-12-06 DIAGNOSIS — M1990 Unspecified osteoarthritis, unspecified site: Secondary | ICD-10-CM | POA: Diagnosis present

## 2021-12-06 DIAGNOSIS — R079 Chest pain, unspecified: Secondary | ICD-10-CM | POA: Diagnosis not present

## 2021-12-06 DIAGNOSIS — Z6836 Body mass index (BMI) 36.0-36.9, adult: Secondary | ICD-10-CM

## 2021-12-06 DIAGNOSIS — E876 Hypokalemia: Secondary | ICD-10-CM | POA: Diagnosis present

## 2021-12-06 DIAGNOSIS — F32A Depression, unspecified: Secondary | ICD-10-CM | POA: Diagnosis present

## 2021-12-06 DIAGNOSIS — G4733 Obstructive sleep apnea (adult) (pediatric): Secondary | ICD-10-CM | POA: Diagnosis present

## 2021-12-06 DIAGNOSIS — N132 Hydronephrosis with renal and ureteral calculous obstruction: Secondary | ICD-10-CM | POA: Diagnosis not present

## 2021-12-06 DIAGNOSIS — Z79899 Other long term (current) drug therapy: Secondary | ICD-10-CM

## 2021-12-06 DIAGNOSIS — N23 Unspecified renal colic: Secondary | ICD-10-CM | POA: Diagnosis not present

## 2021-12-06 DIAGNOSIS — Z91030 Bee allergy status: Secondary | ICD-10-CM

## 2021-12-06 DIAGNOSIS — Z7985 Long-term (current) use of injectable non-insulin antidiabetic drugs: Secondary | ICD-10-CM

## 2021-12-06 DIAGNOSIS — R109 Unspecified abdominal pain: Secondary | ICD-10-CM | POA: Diagnosis not present

## 2021-12-06 DIAGNOSIS — E1121 Type 2 diabetes mellitus with diabetic nephropathy: Secondary | ICD-10-CM | POA: Diagnosis not present

## 2021-12-06 DIAGNOSIS — Z8679 Personal history of other diseases of the circulatory system: Secondary | ICD-10-CM

## 2021-12-06 DIAGNOSIS — K760 Fatty (change of) liver, not elsewhere classified: Secondary | ICD-10-CM | POA: Diagnosis not present

## 2021-12-06 DIAGNOSIS — E7800 Pure hypercholesterolemia, unspecified: Secondary | ICD-10-CM | POA: Diagnosis present

## 2021-12-06 DIAGNOSIS — J449 Chronic obstructive pulmonary disease, unspecified: Secondary | ICD-10-CM | POA: Diagnosis not present

## 2021-12-06 DIAGNOSIS — N202 Calculus of kidney with calculus of ureter: Secondary | ICD-10-CM | POA: Diagnosis present

## 2021-12-06 DIAGNOSIS — K439 Ventral hernia without obstruction or gangrene: Secondary | ICD-10-CM | POA: Diagnosis not present

## 2021-12-06 DIAGNOSIS — K5730 Diverticulosis of large intestine without perforation or abscess without bleeding: Secondary | ICD-10-CM | POA: Diagnosis not present

## 2021-12-06 DIAGNOSIS — I25110 Atherosclerotic heart disease of native coronary artery with unstable angina pectoris: Secondary | ICD-10-CM | POA: Diagnosis present

## 2021-12-06 DIAGNOSIS — R1110 Vomiting, unspecified: Secondary | ICD-10-CM | POA: Diagnosis not present

## 2021-12-06 DIAGNOSIS — Z91048 Other nonmedicinal substance allergy status: Secondary | ICD-10-CM

## 2021-12-06 DIAGNOSIS — I214 Non-ST elevation (NSTEMI) myocardial infarction: Principal | ICD-10-CM | POA: Diagnosis present

## 2021-12-06 DIAGNOSIS — Z885 Allergy status to narcotic agent status: Secondary | ICD-10-CM

## 2021-12-06 DIAGNOSIS — Z882 Allergy status to sulfonamides status: Secondary | ICD-10-CM

## 2021-12-06 DIAGNOSIS — Z9049 Acquired absence of other specified parts of digestive tract: Secondary | ICD-10-CM

## 2021-12-06 DIAGNOSIS — F419 Anxiety disorder, unspecified: Secondary | ICD-10-CM | POA: Diagnosis present

## 2021-12-06 DIAGNOSIS — E1165 Type 2 diabetes mellitus with hyperglycemia: Secondary | ICD-10-CM | POA: Diagnosis not present

## 2021-12-06 DIAGNOSIS — N179 Acute kidney failure, unspecified: Secondary | ICD-10-CM | POA: Diagnosis present

## 2021-12-06 DIAGNOSIS — E119 Type 2 diabetes mellitus without complications: Secondary | ICD-10-CM | POA: Diagnosis not present

## 2021-12-06 DIAGNOSIS — Z881 Allergy status to other antibiotic agents status: Secondary | ICD-10-CM

## 2021-12-06 DIAGNOSIS — N136 Pyonephrosis: Secondary | ICD-10-CM | POA: Diagnosis present

## 2021-12-06 DIAGNOSIS — K3189 Other diseases of stomach and duodenum: Secondary | ICD-10-CM | POA: Diagnosis not present

## 2021-12-06 DIAGNOSIS — Z7984 Long term (current) use of oral hypoglycemic drugs: Secondary | ICD-10-CM

## 2021-12-06 DIAGNOSIS — E785 Hyperlipidemia, unspecified: Secondary | ICD-10-CM | POA: Diagnosis not present

## 2021-12-06 DIAGNOSIS — Z87442 Personal history of urinary calculi: Secondary | ICD-10-CM

## 2021-12-06 HISTORY — DX: Old myocardial infarction: I25.2

## 2021-12-06 HISTORY — DX: Personal history of other diseases of the circulatory system: Z86.79

## 2021-12-06 LAB — COMPREHENSIVE METABOLIC PANEL
ALT: 30 U/L (ref 0–44)
AST: 36 U/L (ref 15–41)
Albumin: 4.5 g/dL (ref 3.5–5.0)
Alkaline Phosphatase: 38 U/L (ref 38–126)
Anion gap: 12 (ref 5–15)
BUN: 19 mg/dL (ref 8–23)
CO2: 22 mmol/L (ref 22–32)
Calcium: 9.9 mg/dL (ref 8.9–10.3)
Chloride: 101 mmol/L (ref 98–111)
Creatinine, Ser: 1.15 mg/dL — ABNORMAL HIGH (ref 0.44–1.00)
GFR, Estimated: 52 mL/min — ABNORMAL LOW (ref >60)
Glucose, Bld: 220 mg/dL — ABNORMAL HIGH (ref 70–99)
Potassium: 3.6 mmol/L (ref 3.5–5.1)
Sodium: 135 mmol/L (ref 135–145)
Total Bilirubin: 0.9 mg/dL (ref 0.3–1.2)
Total Protein: 7.8 g/dL (ref 6.5–8.1)

## 2021-12-06 LAB — TROPONIN I (HIGH SENSITIVITY)
Troponin I (High Sensitivity): 1124 ng/L (ref <18)
Troponin I (High Sensitivity): 2511 ng/L (ref <18)

## 2021-12-06 LAB — GLUCOSE, CAPILLARY
Glucose-Capillary: 130 mg/dL — ABNORMAL HIGH (ref 70–99)
Glucose-Capillary: 130 mg/dL — ABNORMAL HIGH (ref 70–99)

## 2021-12-06 LAB — URINALYSIS, MICROSCOPIC (REFLEX): Squamous Epithelial / HPF: NONE SEEN (ref 0–5)

## 2021-12-06 LAB — HEMOGLOBIN A1C
Hgb A1c MFr Bld: 6.9 % — ABNORMAL HIGH (ref 4.8–5.6)
Mean Plasma Glucose: 151.33 mg/dL

## 2021-12-06 LAB — LACTIC ACID, PLASMA
Lactic Acid, Venous: 3.5 mmol/L (ref 0.5–1.9)
Lactic Acid, Venous: 2.6 mmol/L (ref 0.5–1.9)

## 2021-12-06 LAB — URINALYSIS, ROUTINE W REFLEX MICROSCOPIC
Bilirubin Urine: NEGATIVE
Glucose, UA: >=500 mg/dL — AB
Ketones, ur: 40 mg/dL — AB
Leukocytes,Ua: NEGATIVE
Nitrite: NEGATIVE
Protein, ur: 30 mg/dL — AB
Specific Gravity, Urine: 1.015 (ref 1.005–1.030)
pH: 7.5 (ref 5.0–8.0)

## 2021-12-06 LAB — CBC
HCT: 42.5 % (ref 36.0–46.0)
Hemoglobin: 14.5 g/dL (ref 12.0–15.0)
MCH: 30.5 pg (ref 26.0–34.0)
MCHC: 34.1 g/dL (ref 30.0–36.0)
MCV: 89.5 fL (ref 80.0–100.0)
Platelets: 336 10*3/uL (ref 150–400)
RBC: 4.75 MIL/uL (ref 3.87–5.11)
RDW: 12.5 % (ref 11.5–15.5)
WBC: 12.7 10*3/uL — ABNORMAL HIGH (ref 4.0–10.5)
nRBC: 0 % (ref 0.0–0.2)

## 2021-12-06 LAB — LIPASE, BLOOD: Lipase: 42 U/L (ref 11–51)

## 2021-12-06 LAB — HEPARIN LEVEL (UNFRACTIONATED): Heparin Unfractionated: <0.1 IU/mL — ABNORMAL LOW (ref 0.30–0.70)

## 2021-12-06 LAB — MAGNESIUM: Magnesium: 1.5 mg/dL — ABNORMAL LOW (ref 1.7–2.4)

## 2021-12-06 MED ORDER — FENOFIBRATE 160 MG PO TABS
160.0000 mg | ORAL_TABLET | Freq: Every day | ORAL | Status: DC
  Administered 2021-12-07 – 2021-12-15 (×7): 160 mg via ORAL
  Filled 2021-12-06 (×8): qty 1

## 2021-12-06 MED ORDER — ATORVASTATIN CALCIUM 40 MG PO TABS
40.0000 mg | ORAL_TABLET | Freq: Every day | ORAL | Status: DC
  Administered 2021-12-07 – 2021-12-15 (×7): 40 mg via ORAL
  Filled 2021-12-06 (×9): qty 1

## 2021-12-06 MED ORDER — ALBUTEROL SULFATE HFA 108 (90 BASE) MCG/ACT IN AERS: 1.0000 | INHALATION_SPRAY | Freq: Four times a day (QID) | RESPIRATORY_TRACT | Status: DC | PRN

## 2021-12-06 MED ORDER — SODIUM CHLORIDE 0.9 % IV SOLN
12.5000 mg | Freq: Once | INTRAVENOUS | Status: AC
  Administered 2021-12-06: 12.5 mg via INTRAVENOUS
  Filled 2021-12-06: qty 0.5

## 2021-12-06 MED ORDER — HEPARIN (PORCINE) 25000 UT/250ML-% IV SOLN
1250.0000 [IU]/h | INTRAVENOUS | Status: DC
  Administered 2021-12-06: 900 [IU]/h via INTRAVENOUS
  Filled 2021-12-06 (×2): qty 250

## 2021-12-06 MED ORDER — LACTATED RINGERS IV BOLUS
1000.0000 mL | Freq: Once | INTRAVENOUS | Status: AC
  Administered 2021-12-06: 1000 mL via INTRAVENOUS

## 2021-12-06 MED ORDER — LORATADINE 10 MG PO TABS
10.0000 mg | ORAL_TABLET | Freq: Every day | ORAL | Status: DC
  Administered 2021-12-06 – 2021-12-15 (×8): 10 mg via ORAL
  Filled 2021-12-06 (×9): qty 1

## 2021-12-06 MED ORDER — INSULIN ASPART 100 UNIT/ML IJ SOLN
0.0000 [IU] | Freq: Three times a day (TID) | INTRAMUSCULAR | Status: DC
  Administered 2021-12-07 – 2021-12-08 (×3): 3 [IU] via SUBCUTANEOUS
  Administered 2021-12-08 – 2021-12-10 (×5): 2 [IU] via SUBCUTANEOUS
  Administered 2021-12-10: 3 [IU] via SUBCUTANEOUS
  Administered 2021-12-11: 2 [IU] via SUBCUTANEOUS
  Administered 2021-12-11: 3 [IU] via SUBCUTANEOUS
  Administered 2021-12-12: 2 [IU] via SUBCUTANEOUS

## 2021-12-06 MED ORDER — MAGNESIUM SULFATE 2 GM/50ML IV SOLN
2.0000 g | Freq: Once | INTRAVENOUS | Status: AC
  Administered 2021-12-06: 2 g via INTRAVENOUS
  Filled 2021-12-06: qty 50

## 2021-12-06 MED ORDER — FENTANYL CITRATE PF 50 MCG/ML IJ SOSY
50.0000 ug | PREFILLED_SYRINGE | Freq: Once | INTRAMUSCULAR | Status: AC
  Administered 2021-12-06: 50 ug via INTRAVENOUS
  Filled 2021-12-06: qty 1

## 2021-12-06 MED ORDER — PANTOPRAZOLE SODIUM 20 MG PO TBEC
20.0000 mg | DELAYED_RELEASE_TABLET | Freq: Every day | ORAL | Status: DC
  Administered 2021-12-06 – 2021-12-09 (×4): 20 mg via ORAL
  Filled 2021-12-06 (×4): qty 1

## 2021-12-06 MED ORDER — NITROGLYCERIN 0.4 MG SL SUBL: 0.4000 mg | SUBLINGUAL_TABLET | SUBLINGUAL | Status: DC | PRN

## 2021-12-06 MED ORDER — POTASSIUM CHLORIDE 10 MEQ/100ML IV SOLN
INTRAVENOUS | Status: AC
  Administered 2021-12-06: 10 meq via INTRAVENOUS
  Filled 2021-12-06: qty 100

## 2021-12-06 MED ORDER — SODIUM CHLORIDE 0.9 % WEIGHT BASED INFUSION
1.0000 mL/kg/h | INTRAVENOUS | Status: DC
  Administered 2021-12-06: 1 mL/kg/h via INTRAVENOUS

## 2021-12-06 MED ORDER — DAPAGLIFLOZIN PROPANEDIOL 10 MG PO TABS
10.0000 mg | ORAL_TABLET | Freq: Every day | ORAL | Status: DC
  Administered 2021-12-07 – 2021-12-09 (×3): 10 mg via ORAL
  Filled 2021-12-06 (×3): qty 1

## 2021-12-06 MED ORDER — SODIUM CHLORIDE 0.9% FLUSH: 3.0000 mL | INTRAVENOUS | Status: DC | PRN

## 2021-12-06 MED ORDER — HEPARIN BOLUS VIA INFUSION
4000.0000 [IU] | Freq: Once | INTRAVENOUS | Status: AC
Start: 2021-12-06 — End: 2021-12-06
  Administered 2021-12-06: 4000 [IU] via INTRAVENOUS

## 2021-12-06 MED ORDER — ASPIRIN 81 MG PO CHEW
81.0000 mg | CHEWABLE_TABLET | ORAL | Status: AC
  Administered 2021-12-07: 81 mg via ORAL
  Filled 2021-12-06: qty 1

## 2021-12-06 MED ORDER — PROMETHAZINE HCL 25 MG/ML IJ SOLN
INTRAMUSCULAR | Status: AC
  Filled 2021-12-06: qty 1

## 2021-12-06 MED ORDER — HEPARIN BOLUS VIA INFUSION
2000.0000 [IU] | Freq: Once | INTRAVENOUS | Status: AC
  Administered 2021-12-06: 2000 [IU] via INTRAVENOUS
  Filled 2021-12-06: qty 2000

## 2021-12-06 MED ORDER — POTASSIUM CHLORIDE 10 MEQ/100ML IV SOLN
10.0000 meq | INTRAVENOUS | Status: AC
  Administered 2021-12-06: 10 meq via INTRAVENOUS
  Filled 2021-12-06: qty 100

## 2021-12-06 MED ORDER — CITALOPRAM HYDROBROMIDE 20 MG PO TABS
40.0000 mg | ORAL_TABLET | Freq: Every day | ORAL | Status: DC
  Administered 2021-12-07 – 2021-12-13 (×5): 40 mg via ORAL
  Filled 2021-12-06 (×6): qty 2

## 2021-12-06 MED ORDER — B COMPLEX-C PO TABS
1.0000 | ORAL_TABLET | Freq: Every day | ORAL | Status: DC
  Administered 2021-12-07 – 2021-12-15 (×7): 1 via ORAL
  Filled 2021-12-06 (×9): qty 1

## 2021-12-06 MED ORDER — SODIUM CHLORIDE 0.9 % IV SOLN: INTRAVENOUS | Status: DC

## 2021-12-06 MED ORDER — ASPIRIN 81 MG PO CHEW
324.0000 mg | CHEWABLE_TABLET | Freq: Once | ORAL | Status: AC
  Administered 2021-12-06: 324 mg via ORAL
  Filled 2021-12-06: qty 4

## 2021-12-06 MED ORDER — SODIUM CHLORIDE 0.9 % IV SOLN: 250.0000 mL | INTRAVENOUS | Status: DC | PRN

## 2021-12-06 MED ORDER — SODIUM CHLORIDE 0.9% FLUSH: 3.0000 mL | Freq: Two times a day (BID) | INTRAVENOUS | Status: DC

## 2021-12-06 MED ORDER — MONTELUKAST SODIUM 10 MG PO TABS
10.0000 mg | ORAL_TABLET | Freq: Every day | ORAL | Status: DC
  Administered 2021-12-06 – 2021-12-14 (×7): 10 mg via ORAL
  Filled 2021-12-06 (×8): qty 1

## 2021-12-06 MED ORDER — ALBUTEROL SULFATE (2.5 MG/3ML) 0.083% IN NEBU: 2.5000 mg | INHALATION_SOLUTION | Freq: Four times a day (QID) | RESPIRATORY_TRACT | Status: DC | PRN

## 2021-12-06 NOTE — H&P (Signed)
History and Physical    Jade Perez QVZ:[id number] DOB: 09/25/1955 DOA: 12/06/2021  PCP: Abner Greenspan, MD Patient coming from:   I have personally briefly reviewed patient's old medical records in [facility]  Chief Complaint: n/v, indigestion   HPI: Jade Perez is a 66 y.o. female with medical history significant of HLD, HTN, NIDDM2 present to [facility] due to flank pain, n/v, CT renal stone obtained showed left renal pelvis stone , no hydro , she is found to have elevated troponin, she is started on heparin drip, admitted to [facility] for cardiology eval. Her n/v and flank pain has resolved, currently she denies acute complaints.  Data reviewed:  Her vital signs are stable, WBC 12.7, creatinine 1.15, blood glucose 220, magnesium 1.2, troponin 2511, lactic acid 3.5,    Review of Systems: As per HPI otherwise all other systems reviewed and are negative.   Past Medical History:  Diagnosis Date   Anemia    anemic in 2010   Anxiety    Arthritis    thumb and fingers, knee, right hip   Asthma    takes montelukast-when sick   Cataract    forming    Complication of anesthesia    difficulty waking up   Depression    Diabetes mellitus without complication (HCC)    type 2, diagnosed 2008   GERD (gastroesophageal reflux disease)    omeprazole and ranitidine   Heart murmur    since birth very slight   High cholesterol    History of hiatal hernia    small   History of kidney stones    december 3- January 7th   Hypertension    Sleep apnea    sleep study in [place], has but does not use CPAP, Dr. Yetta Flock has results [facility]    Past Surgical History:  Procedure Laterality Date   ABDOMINAL EXPLORATION SURGERY     APPENDECTOMY     BREAST EXCISIONAL BIOPSY Left    benign   CARPAL TUNNEL RELEASE Right 05/22/2017   Procedure: RIGHT CARPAL TUNNEL RELEASE;  Surgeon: Cindee Salt, MD;  Location: [facility];  Service: Orthopedics;   Laterality: Right;   CARPAL TUNNEL RELEASE Left 03/12/2018   Procedure: LEFT CARPAL TUNNEL RELEASE;  Surgeon: Cindee Salt, MD;  Location: [facility];  Service: Orthopedics;  Laterality: Left;   COLONOSCOPY  06/04/2012   cyst removed     right breast two   DILATION AND CURETTAGE OF UTERUS     EYE SURGERY Left    laser surgery on retina for tear-done in office   LIPOMA EXCISION     LIPOMA EXCISION     right shoulder   LITHOTRIPSY  03/2017   RETINAL DETACHMENT SURGERY Right    SCLERAL BUCKLE  10/15/2011   Procedure: SCLERAL BUCKLE;  Surgeon: Sherrie George, MD;  Location: [facility] OR;  Service: Ophthalmology;  Laterality: Right;   SCLERAL BUCKLE PROCEDURE Right    SHOULDER ARTHROSCOPY WITH ROTATOR CUFF REPAIR Right 03/25/2019   Procedure: Right shoulder arthroscopy, subacromial decompression, distal clavicle resection, rotator cuff repair;  Surgeon: Francena Hanly, MD;  Location: [facility];  Service: Orthopedics;  Laterality: Right;    UPPER GASTROINTESTINAL ENDOSCOPY  05/25/2014   WISDOM TOOTH EXTRACTION      Social History  reports that she has never smoked. She has never used smokeless tobacco. She reports that she does not drink alcohol and does not use drugs.  Allergies  Allergen Reactions   Amoxicillin Hives    Did it involve swelling of the face/tongue/throat, SOB, or low BP? No Did it involve sudden or severe rash/hives, skin peeling, or any reaction on the inside of your mouth or nose? No Did you need to seek medical attention at a hospital or doctor's office? No When did it last happen?      15-20 years ago If all above answers are "NO", may proceed with cephalosporin use.    Bee Venom    Biaxin [Clarithromycin]    Erythromycin Nausea Only and Other (See Comments)    Stomach pain   Ibuprofen Swelling   Other     Bee Stings   Percocet [Oxycodone-Acetaminophen] Itching   Sulfa Antibiotics Hives   Latex Rash   Tape Rash    & bandaids - blisters    Family  History  Problem Relation Age of Onset   Unexplained death Sister 80       died in her sleep   Colon cancer Paternal Grandmother    Colon cancer Paternal Uncle    Allergic rhinitis Neg Hx    Angioedema Neg Hx    Asthma Neg Hx    Atopy Neg Hx    Eczema Neg Hx    Immunodeficiency Neg Hx    Urticaria Neg Hx    Colon polyps Neg Hx    Esophageal cancer Neg Hx    Stomach cancer Neg Hx    Rectal cancer Neg Hx    Breast cancer Neg Hx     Prior to Admission medications   Medication Sig Start Date End Date Taking? Authorizing Provider  albuterol (PROAIR HFA) 108 (90 Base) MCG/ACT inhaler Can inhale two puffs every four to six hours as needed for cough or wheeze. 06/08/20  Yes Kozlow, Alvira Philips, MD  atorvastatin (LIPITOR) 40 MG tablet Take 40 mg by mouth daily.   Yes [provider]  b complex vitamins tablet Take 1 tablet by mouth daily.   Yes [provider]  cetirizine (ZYRTEC) 10 MG tablet Take 10 mg by mouth daily.   Yes [provider]  citalopram (CELEXA) 40 MG tablet Take 40 mg by mouth daily.   Yes [provider]  dapagliflozin propanediol (FARXIGA) 10 MG TABS tablet Take 10 mg by mouth daily.   Yes [provider]  fenofibrate (TRICOR) 145 MG tablet Take 145 mg by mouth daily. 09/22/19  Yes [provider]  Ferrous Sulfate (SLOW FE PO) Take 1 tablet by mouth daily.   Yes [provider]  glimepiride (AMARYL) 1 MG tablet Take 0.5 mg by mouth 2 (two) times daily. 02/15/19  Yes [provider]  losartan (COZAAR) 25 MG tablet Take 25 mg by mouth daily.   Yes [provider]  montelukast (SINGULAIR) 10 MG tablet TAKE 1 TABLET BY MOUTH EVERY DAY 07/19/19  Yes Kozlow, Alvira Philips, MD  Multiple Vitamin (MULTIVITAMIN WITH MINERALS) TABS Take 1 tablet by mouth daily.   Yes [provider]  Omega-3 Fatty Acids (FISH OIL PO) Take 1 capsule by mouth 2 (two) times daily.    Yes [provider]  omeprazole  (PRILOSEC) 40 MG capsule TAKE 1 CAPSULE BY MOUTH EVERY DAY IN THE MORNING BEFORE BREAKFAST 07/30/21  Yes Kozlow, Alvira Philips, MD  ACCU-CHEK AVIVA PLUS test strip 1 each daily. 12/03/19   [provider]  diclofenac Sodium (VOLTAREN) 1 % GEL Apply 4 g topically 4 (four) times daily. 07/03/21   Marylene Land,  [place], DPM  FIBER PO Take 3 tablets by mouth daily.     [provider]  loratadine (CLARITIN) 10 MG tablet Take 10 mg by mouth daily. Patient not taking: Reported on 06/08/2020    [provider]  OZEMPIC, 1 MG/DOSE, 4 MG/3ML SOPN INJECT 1 MG UNDER SKIN EVERY WEEK 05/25/20   [provider]  triamcinolone (NASACORT) 55 MCG/ACT AERO nasal inhaler Place 2 sprays into the nose daily.    [provider]  Vitamin D, Ergocalciferol, (DRISDOL) 50000 UNITS CAPS capsule Take 50,000 Units by mouth every 30 (thirty) days.     [provider]    Physical Exam: Vitals:   12/06/21 1425 12/06/21 1522 12/06/21 1604 12/06/21 1938  BP:  136/66 128/70 92/76  Pulse:  62 62 67  Resp:  16 18 18   Temp: 98.5 F (36.9 C)  98.6 F (37 C) 98.7 F (37.1 C)  TempSrc: Oral  Oral Oral  SpO2:  100% 99% 98%  Weight:      Height:        Constitutional: NAD, calm, comfortable Eyes: PERRL, lids and conjunctivae normal ENMT: Mucous membranes are moist.  Respiratory: clear to auscultation bilaterally, no wheezing, no crackles. Normal respiratory effort. No accessory muscle use.  Cardiovascular: Regular rate and rhythm,  No extremity edema. 2+ pedal pulses. No carotid bruits.  Abdomen: no tenderness, not distended, Bowel sounds positive.  Musculoskeletal: no clubbing / cyanosis. No joint deformity upper and lower extremities. Good ROM, no contractures. Normal muscle tone.  Skin: no rashes, lesions, ulcers. No induration Neurologic: CN 2-12 grossly intact. Sensation intact, Strength 5/5 in all 4.  Psychiatric: Normal judgment and insight. Alert and oriented x 3. Normal mood.     Labs on Admission: I have personally reviewed following labs and imaging studies  CBC: Recent Labs  Lab 12/06/21 0834  WBC 12.7*  HGB 14.5  HCT 42.5  MCV 89.5  PLT 336    Basic Metabolic Panel: Recent Labs  Lab 12/06/21 0834  NA 135  K 3.6  CL 101  CO2 22  GLUCOSE 220*  BUN 19  CREATININE 1.15*  CALCIUM 9.9  MG 1.5*    GFR: Estimated Creatinine Clearance: 49.1 mL/min (A) (by C-G formula based on SCr of 1.15 mg/dL (H)).  Liver Function Tests: Recent Labs  Lab 12/06/21 0834  AST 36  ALT 30  ALKPHOS 38  BILITOT 0.9  PROT 7.8  ALBUMIN 4.5    Urine analysis:    Component Value Date/Time   COLORURINE YELLOW 12/06/2021 0848   APPEARANCEUR CLEAR 12/06/2021 0848   LABSPEC 1.015 12/06/2021 0848   PHURINE 7.5 12/06/2021 0848   GLUCOSEU >=500 (A) 12/06/2021 0848   HGBUR MODERATE (A) 12/06/2021 0848   BILIRUBINUR NEGATIVE 12/06/2021 0848   KETONESUR 40 (A) 12/06/2021 0848   PROTEINUR 30 (A) 12/06/2021 0848   NITRITE NEGATIVE 12/06/2021 0848   LEUKOCYTESUR NEGATIVE 12/06/2021 0848    Radiological Exams on Admission: CT Renal Stone Study  Result Date: 12/06/2021 CLINICAL DATA:  Flank pain.  Kidney stones suspected. EXAM: CT ABDOMEN AND PELVIS WITHOUT CONTRAST TECHNIQUE: Multidetector CT imaging of the abdomen and pelvis was performed following the standard protocol without IV contrast. RADIATION DOSE REDUCTION: This exam was performed according to the departmental dose-optimization program which includes automated exposure control, adjustment of the mA and/or kV according to patient size and/or use of iterative reconstruction technique. COMPARISON:  None Available. FINDINGS: Lower chest: Unremarkable. Hepatobiliary: The liver shows diffusely decreased attenuation suggesting  fat deposition. No suspicious focal abnormality in the liver on this study without intravenous contrast. There is no evidence for gallstones, gallbladder wall thickening, or pericholecystic  fluid. No intrahepatic or extrahepatic biliary dilation. Pancreas: No focal mass lesion. No dilatation of the main duct. No intraparenchymal cyst. No peripancreatic edema. Spleen: No splenomegaly. No focal mass lesion. Adrenals/Urinary Tract: No adrenal nodule or mass. Fiber 6 nonobstructing stones are seen in the right kidney measuring up to 5 mm. No right ureteral stones. 7 rate stones are seen in the left kidney measuring up to about 3 mm. The patient also has a 7 x 6 x 8 mm stone in the left renal pelvis near the UPJ. Mild fullness noted left intrarenal collecting system without overt hydronephrosis. No left ureteral stone. No bladder stones. Stomach/Bowel: Tiny hiatal hernia. Stomach moderately distended with fluid. Duodenum is normally positioned as is the ligament of Treitz. No small bowel wall thickening. No small bowel dilatation. The terminal ileum is normal. Nonvisualization of the appendix is consistent with the reported history of appendectomy. A few scattered diverticuli are seen in the left colon without diverticulitis. Vascular/Lymphatic: No abdominal aortic aneurysm. No abdominal aortic atherosclerotic calcification. There is no gastrohepatic or hepatoduodenal ligament lymphadenopathy. No retroperitoneal or mesenteric lymphadenopathy. No pelvic sidewall lymphadenopathy. Reproductive: Unremarkable. Other: No intraperitoneal free fluid. Musculoskeletal: No worrisome lytic or sclerotic osseous abnormality. IMPRESSION: 1. 7 x 6 x 8 mm stone in the left renal pelvis near the UPJ with mild fullness of the left intrarenal collecting system but no overt hydronephrosis. 2. Bilateral nonobstructing renal stones. 3. Tiny hiatal hernia. 4. Hepatic steatosis. Electronically Signed   By: Kennith Center M.D.   On: 12/06/2021 10:07   DG Chest 2 View  Result Date: 12/06/2021 CLINICAL DATA:  Chest and abdominal pain in a 66 year old female. EXAM: CHEST - 2 VIEW COMPARISON:  October 15, 2011. FINDINGS: EKG leads  project over the chest. Cardiomediastinal contours and hilar structures are normal. Lungs are clear. No sign of pleural effusion. On limited assessment no acute skeletal findings. Mild curvature of the spine is similar with dextroconvex component in the thoracic spine. IMPRESSION: No acute cardiopulmonary disease. Electronically Signed   By: Donzetta Kohut M.D.   On: 12/06/2021 09:05    EKG: Independently reviewed.   Assessment/Plan Principal Problem:   NSTEMI (non-ST elevated myocardial infarction) (HCC)     NSTEMI Received asa in the ED Continue heparin drip, prn sublinqual nitro Check lipid panel Management per cardiology  NIDDM2 Reports recent a1c was 6.8 Am hyperglycemia likely due to stress Check a1c, start ssi Continue farxiga   Renal stone Currently no pain, no n/v, reports urine with odor, no dysuria, no hematuria Reviewed CT renal stone result with urology Dr. Mena Goes, currently patient has no fever, renal function appear close to baseline, will obtain urine culture and blood culture, if her renal function continue to be stable, no concern of infection, can be followed up outpatient  Mild leukocytosis Does not appear septic, likely due to stress Add on urine culture and blood culture Repeat cbc in am Hold off abx for now unless spike fever  Hypomagnesemia Replace mag  DVT prophylaxis: on heparin drip   Code Status:   full Family Communication: husband at bedside    Patient is from: home   Anticipated DC to: home   Anticipated DC date: >24hrs, need cardiac work up    [employer] called:  cardiology Admission status:  INP  Severity of Illness:   The appropriate  patient status for this patient is INPATIENT due to history and comorbidities, severity of illness, required intensity of service to ensure the patient's safety and to avoid risk of adverse events/further clinical deterioration.  Severity of illness/comorbidities:NSTEMI Intensity of service: tests,  high frequency of surveillance, interventions It is not anticipated that the patient will be medically stable for discharge from the hospital within 2 midnights of admission.    Voice Recognition Reubin Milan/Dragon dictation system was used to create this note, attempts have been made to correct errors. Please contact the author with questions and/or clarifications.  Albertine GratesFang Lajeana Strough MD PhD FACP [facility]  How to contact the [facility] Attending or Consulting provider 7A - 7P or covering provider during after hours 7P -7A, for this patient?   Check the care team in [facility] and look for a) attending/consulting [facility] provider listed and b) the [facility] team listed Log into [email or web address] and use [place]'s universal password to access. If you do not have the password, please contact the hospital operator. Locate the [facility] provider you are looking for under [facility] and page to a number that you can be directly reached. If you still have difficulty reaching the provider, please page the [facility] (Director on Call) for the Hospitalists listed on amion for assistance.  12/06/2021, 7:54 PM

## 2021-12-06 NOTE — Progress Notes (Signed)
ANTICOAGULATION CONSULT NOTE - Initial Consult  Pharmacy Consult for heparin Indication: chest pain/ACS  Allergies  Allergen Reactions   Amoxicillin Hives    Did it involve swelling of the face/tongue/throat, SOB, or low BP? No Did it involve sudden or severe rash/hives, skin peeling, or any reaction on the inside of your mouth or nose? No Did you need to seek medical attention at a hospital or doctor's office? No When did it last happen?      15-20 years ago If all above answers are "NO", may proceed with cephalosporin use.    Aspirin Swelling   Bee Venom    Biaxin [Clarithromycin]    Erythromycin Nausea Only and Other (See Comments)    Stomach pain   Other     Bee Stings   Percocet [Oxycodone-Acetaminophen] Itching   Sulfa Antibiotics Hives   Latex Rash   Tape Rash    & bandaids - blisters    Patient Measurements: Height: 5\' 2"  (157.5 cm) Weight: 88.5 kg (195 lb) IBW/kg (Calculated) : 50.1 Heparin Dosing Weight: 70.4kg  Vital Signs: Temp: 97.6 F (36.4 C) (08/31 0835) BP: 119/61 (08/31 1040) Pulse Rate: 76 (08/31 1040)  Labs: Recent Labs    12/06/21 0834 12/06/21 0849  HGB 14.5  --   HCT 42.5  --   PLT 336  --   CREATININE 1.15*  --   TROPONINIHS  --  1,124*    Estimated Creatinine Clearance: 49.1 mL/min (A) (by C-G formula based on SCr of 1.15 mg/dL (H)).   Medical History: Past Medical History:  Diagnosis Date   Allergy    Anemia    Anemia    anemic in 2010   Anxiety    Arthritis    Arthritis    thumb and fingers, knee, right hip   Asthma    takes montelukast-when sick   Cataract    forming    Chronic kidney disease    kidney stones    Complication of anesthesia    difficulty waking up   Complication of anesthesia    difficult to wake up    Depression    Diabetes mellitus    Diabetes mellitus without complication (HCC)    type 2, diagnosed 2008   GERD (gastroesophageal reflux disease)    GERD (gastroesophageal reflux disease)     omeprazole and ranitidine   Heart murmur    since birth very slight   High cholesterol    History of hiatal hernia    small   History of kidney stones    december 3- January 7th   History of kidney stones    Hypertension    Sleep apnea    sleep study in [place], has but does not use CPAP, Dr. Baldwin park has results [facility]   Sleep apnea    does not wear cpap    Medications:  Infusions:   sodium chloride     heparin     potassium chloride 10 mEq (12/06/21 1036)    Assessment: 66 yof presented to the ED with nausea and back pain. Troponin elevated and now starting IV heparin. Baseline CBC is WNL. She is not on anticoagulation PTA.   Goal of Therapy:  Heparin level 0.3-0.7 units/ml Monitor platelets by anticoagulation protocol: Yes   Plan:  Heparin bolus 4000 units IV x 1 Heparin gtt 900 units/hr Check a 6 hr heparin level Daily heparin level and CBC  Lakia Gritton, 12/08/21 12/06/2021,10:55 AM

## 2021-12-06 NOTE — ED Notes (Signed)
[facility] on unit to transport pt to [facility] per MD order. Receiving RN Maralyn Sago made aware.

## 2021-12-06 NOTE — TOC Progression Note (Signed)
Transition of Care [facility]) - Progression Note    Patient Details  Name: Jade Perez MRN: [id number] Date of Birth: [birth year]  Transition of Care [facility]) CM/SW Contact  Leone Haven, RN Phone Number: 12/06/2021, 4:14 PM  Clinical Narrative:    from home, NSTEMI from failed stress test due to pain and n/v, conts on hep drip. TOC following.         Expected Discharge Plan and Services                                                 Social Determinants of Health (SDOH) Interventions    Readmission Risk Interventions     No data to display

## 2021-12-06 NOTE — H&P (View-Only) (Signed)
Cardiology Consultation   Patient ID: Jade Perez MRN: [id number]; DOB: [birth year]  Admit date: 12/06/2021 Date of Consult: 12/06/2021  PCP:  Jade Perez, Beth, MD   [facility] Providers Cardiologist:  Perez  new  Patient Profile:   Jade FothergillMary Marley Perez is a 66 y.o. female with a hx of anemia, asthma, DM, HLD, HTN, OSA not on CPAP, who is being seen 12/06/2021 for the evaluation of NSTEMI at the request of Jade Perez.  History of Present Illness:   Jade Perez has never had a cardiology eval.   Last pm, she had mid-epigastric that radiated up into her chest. It started about 2:30 am, associated w/ N&V, diaphoresis, weakness, +SOB. Aching, it eventually reached an 8-9/10. When it did not resolve, [facility]   In the ER, the pain felt like hunger pangs, but had no desire to eat.   She got fentanyl and phenergan, sx improved. The upper abd pain and the chest pain completely resolved. She had some mild chest pain that did not last very long, no treatment needed.  Cardiac ez were elevated. [facility] and [facility].   She has never had pain like this before. This is not like her reflux.  Occasionally, she feels her heart pounding, but she takes deep breaths and sx resolve. That did not happen today.  [place] [facility] [place], both times she had to stop several times going up to catch her breath. However, she did not have any chest pain.   Past Medical History:  Diagnosis Date   Anemia    anemic in 2010   Anxiety    Arthritis    thumb and fingers, knee, Perez hip   Asthma    takes montelukast-when sick   Cataract    forming    Complication of anesthesia    difficulty waking up   Depression    Diabetes mellitus without complication (HCC)    type 2, diagnosed 2008   GERD (gastroesophageal reflux disease)    omeprazole and ranitidine   Heart murmur    since birth very slight   High  cholesterol    History of hiatal hernia    small   History of kidney stones    december 3- January 7th   Hypertension    Sleep apnea    [place], has but does not use CPAP, Jade. Yetta Perez has results [facility] Perez    Past Surgical History:  Procedure Laterality Date   ABDOMINAL EXPLORATION SURGERY     APPENDECTOMY     BREAST EXCISIONAL BIOPSY Left    benign   CARPAL TUNNEL RELEASE Perez 05/22/2017   Procedure: Perez CARPAL TUNNEL RELEASE;  Surgeon: Jade Perez, Gary, MD;  Location: [facility];  Service: Perez;  Laterality: Perez;   CARPAL TUNNEL RELEASE Left 03/12/2018   Procedure: LEFT CARPAL TUNNEL RELEASE;  Surgeon: Jade Perez, Gary, MD;  Location: [facility];  Service: Perez;  Laterality: Left;   COLONOSCOPY  06/04/2012   cyst removed     Perez breast two   DILATION AND CURETTAGE OF UTERUS     [facility]ade SURGERY Left    laser surgery on retina for tear-done in office   LIPOMA EXCISION     LIPOMA EXCISION     Perez shoulder   LITHOTRIPSY  03/2017   RETINAL DETACHMENT SURGERY Perez    SCLERAL  BUCKLE  10/15/2011   Procedure: SCLERAL BUCKLE;  Surgeon: Jade George, MD;  Location[facility];  Service: Ophthalmology;  Laterality: Perez;   SCLERAL BUCKLE PROCEDURE Perez    SHOULDER ARTHROSCOPY WITH ROTATOR CUFF REPAIR Perez 03/25/2019   Procedure: Perez shoulder arthroscopy, subacromial decompression, distal clavicle resection, rotator cuff repair;  Surgeon: Jade Hanly, MD;  Location: [facility];  Service: Perez;  Laterality: Perez;    UPPER GASTROINTESTINAL Perez  05/25/2014   WISDOM TOOTH EXTRACTION       Home Medications:  Prior to Admission medications   Medication Sig Start Date End Date Taking? Authorizing Provider  albuterol (PROAIR HFA) 108 (90 Base) MCG/ACT inhaler Can inhale two puffs every four to six hours as needed for cough or wheeze. 06/08/20  Yes Perez, Jade Philips, MD  atorvastatin (LIPITOR) 40 MG tablet Take  40 mg by mouth daily.   Yes [provider]  b complex vitamins tablet Take 1 tablet by mouth daily.   Yes [provider]  cetirizine (ZYRTEC) 10 MG tablet Take 10 mg by mouth daily.   Yes [provider]  citalopram (CELEXA) 40 MG tablet Take 40 mg by mouth daily.   Yes [provider]  dapagliflozin propanediol (FARXIGA) 10 MG TABS tablet Take 10 mg by mouth daily.   Yes [provider]  fenofibrate (TRICOR) 145 MG tablet Take 145 mg by mouth daily. 09/22/19  Yes [provider]  Ferrous Sulfate (SLOW FE PO) Take 1 tablet by mouth daily.   Yes [provider]  glimepiride (AMARYL) 1 MG tablet Take 0.5 mg by mouth 2 (two) times daily. 02/15/19  Yes [provider]  losartan (COZAAR) 25 MG tablet Take 25 mg by mouth daily.   Yes [provider]  montelukast (SINGULAIR) 10 MG tablet TAKE 1 TABLET BY MOUTH EVERY DAY 07/19/19  Yes Perez, Jade Philips, MD  Multiple Vitamin (MULTIVITAMIN WITH MINERALS) TABS Take 1 tablet by mouth daily.   Yes [provider]  Omega-3 Fatty Acids (FISH OIL PO) Take 1 capsule by mouth 2 (two) times daily.    Yes [provider]  omeprazole (PRILOSEC) 40 MG capsule TAKE 1 CAPSULE BY MOUTH EVERY DAY IN THE MORNING BEFORE BREAKFAST 07/30/21  Yes Perez, Jade Philips, MD  ACCU-CHEK AVIVA PLUS test strip 1 each daily. 12/03/19   [provider]  diclofenac Sodium (VOLTAREN) 1 % GEL Apply 4 g topically 4 (four) times daily. 07/03/21   Jade Perez, DPM  FIBER PO Take 3 tablets by mouth daily.     [provider]  loratadine (CLARITIN) 10 MG tablet Take 10 mg by mouth daily. Patient not taking: Reported on 06/08/2020    [provider]  OZEMPIC, 1 MG/DOSE, 4 MG/3ML SOPN INJECT 1 MG UNDER SKIN EVERY WEEK 05/25/20   [provider]  triamcinolone (NASACORT) 55 MCG/ACT AERO nasal inhaler Place 2 sprays into the nose daily.    [provider]  Vitamin D,  Ergocalciferol, (DRISDOL) 50000 UNITS CAPS capsule Take 50,000 Units by mouth every 30 (thirty) days.     [provider]    Inpatient Medications: Scheduled Meds:  [START ON 12/07/2021] atorvastatin  40 mg Oral Daily   [START ON 12/07/2021] B-complex with vitamin C  1 tablet Oral Daily   [START ON 12/07/2021] citalopram  40 mg Oral Daily   [START ON 12/07/2021] dapagliflozin propanediol  10 mg Oral Daily   [START ON 12/07/2021] fenofibrate  160 mg Oral  Daily   [START ON 12/07/2021] insulin aspart  0-15 Units Subcutaneous TID WC   loratadine  10 mg Oral Daily   montelukast  10 mg Oral QHS   pantoprazole  20 mg Oral Daily   Continuous Infusions:  sodium chloride     heparin 900 Units/hr (12/06/21 1111)   magnesium sulfate bolus IVPB 2 g (12/06/21 1747)   PRN Meds:   Allergies:    Allergies  Allergen Reactions   Amoxicillin Hives    Did it involve swelling of the face/tongue/throat, SOB, or low BP? No Did it involve sudden or severe rash/hives, skin peeling, or any reaction on the inside of your mouth or nose? No Did you need to seek medical attention at a hospital or doctor's office? No When did it last happen?      15-20 years ago If all above answers are "NO", may proceed with cephalosporin use.    Bee Venom    Biaxin [Clarithromycin]    Erythromycin Nausea Only and Other (See Comments)    Stomach pain   Ibuprofen Swelling   Other     Bee Stings   Percocet [Oxycodone-Acetaminophen] Itching   Sulfa Antibiotics Hives   Latex Rash   Tape Rash    & bandaids - blisters    Social History:   Social History   Socioeconomic History   Marital status: Married    Spouse name: Not on file   Number of children: Not on file   Years of education: Not on file   Highest education level: Not on file  Occupational History   Not on file  Tobacco Use   Smoking status: Never   Smokeless tobacco: Never  Vaping Use   Vaping Use: Never used  Substance and Sexual Activity   Alcohol  use: No   Drug use: No   Sexual activity: Not Currently  Other Topics Concern   Not on file  Social History Narrative   ** Merged History Encounter **       Social Determinants of Health   Financial Resource Strain: Not on file  Food Insecurity: Not on file  Transportation Needs: Not on file  Physical Activity: Not on file  Stress: Not on file  Social Connections: Not on file  Intimate Partner Violence: Not on file    Family History:   Family History  Problem Relation Age of Onset   Unexplained death Sister 44       died in her sleep   Colon cancer Paternal Grandmother    Colon cancer Paternal Uncle    Allergic rhinitis Neg Hx    Angioedema Neg Hx    Asthma Neg Hx    Atopy Neg Hx    Eczema Neg Hx    Immunodeficiency Neg Hx    Urticaria Neg Hx    Colon polyps Neg Hx    Esophageal cancer Neg Hx    Stomach cancer Neg Hx    Rectal cancer Neg Hx    Breast cancer Neg Hx      ROS:  Please see the history of present illness.  All other ROS reviewed and negative.     Physical Exam/Data:   Vitals:   12/06/21 1400 12/06/21 1425 12/06/21 1522 12/06/21 1604  BP: (!) 110/54  136/66 128/70  Pulse: 66  62 62  Resp: 14  16 18   Temp:  98.5 F (36.9 C)  98.6 F (37 C)  TempSrc:  Oral  Oral  SpO2: 93%  100% 99%  Weight:      Height:        Intake/Output Summary (Last 24 hours) at 12/06/2021 1755 Last data filed at 12/06/2021 1145 Gross per 24 hour  Intake 1099 ml  Output --  Net 1099 ml      12/06/2021    8:31 AM 06/08/2020   11:04 AM 01/12/2020    7:49 AM  Last 3 Weights  Weight (lbs) 195 lb 202 lb 3.2 oz 197 lb  Weight (kg) 88.451 kg 91.717 kg 89.359 kg     Body mass index is 35.67 kg/m.  General:  Well nourished, well developed, in no acute distress HEENT: normal Neck: no JVD Vascular: No carotid bruits; Distal pulses 2+ bilaterally Cardiac:  normal S1, S2; RRR; no murmur  Lungs:  clear to auscultation bilaterally, no wheezing, rhonchi or rales  Abd: soft,  nontender, no hepatomegaly  Ext: no edema Musculoskeletal:  No deformities, BUE and BLE strength normal and equal Skin: warm and dry  Neuro:  CNs 2-12 intact, no focal abnormalities noted Psych:  Normal affect   EKG:  The EKG was personally reviewed and demonstrates:  SR, HR 68, no ST changes, no pathologic Q waves, no change from 2020 Telemetry:  Telemetry was personally reviewed and demonstrates:  SR  Relevant CV Studies:  ECHO: ordered  Laboratory Data:  High Sensitivity Troponin:   Recent Labs  Lab 12/06/21 0849 12/06/21 1053  TROPONINIHS 1,124* 2,511*     Chemistry Recent Labs  Lab 12/06/21 0834  NA 135  K 3.6  CL 101  CO2 22  GLUCOSE 220*  BUN 19  CREATININE 1.15*  CALCIUM 9.9  MG 1.5*  GFRNONAA 52*  ANIONGAP 12    Recent Labs  Lab 12/06/21 0834  PROT 7.8  ALBUMIN 4.5  AST 36  ALT 30  ALKPHOS 38  BILITOT 0.9   Lipids No results found for: "CHOL", "HDL", "LDLCALC", "LDLDIRECT", "TRIG", "CHOLHDL"   Hematology Recent Labs  Lab 12/06/21 0834  WBC 12.7*  RBC 4.75  HGB 14.5  HCT 42.5  MCV 89.5  MCH 30.5  MCHC 34.1  RDW 12.5  PLT 336   Thyroid No results for input(s): "TSH", "FREET4" in the last 168 hours.  BNPNo results for input(s): "BNP", "PROBNP" in the last 168 hours.  DDimer No results for input(s): "DDIMER" in the last 168 hours.   Radiology/Studies:  CT Renal Stone Study  Result Date: 12/06/2021 CLINICAL DATA:  Flank pain.  Kidney stones suspected. EXAM: CT ABDOMEN AND PELVIS WITHOUT CONTRAST TECHNIQUE: Multidetector CT imaging of the abdomen and pelvis was performed following the standard protocol without IV contrast. RADIATION DOSE REDUCTION: This exam was performed according to the departmental dose-optimization program which includes automated exposure control, adjustment of the mA and/or kV according to patient size and/or use of iterative reconstruction technique. COMPARISON:  Perez Available. FINDINGS: Lower chest: Unremarkable.  Hepatobiliary: The liver shows diffusely decreased attenuation suggesting fat deposition. No suspicious focal abnormality in the liver on this study without intravenous contrast. There is no evidence for gallstones, gallbladder wall thickening, or pericholecystic fluid. No intrahepatic or extrahepatic biliary dilation. Pancreas: No focal mass lesion. No dilatation of the main duct. No intraparenchymal cyst. No peripancreatic edema. Spleen: No splenomegaly. No focal mass lesion. Adrenals/Urinary Tract: No adrenal nodule or mass. Fiber 6 nonobstructing stones are seen in the Perez kidney measuring up to 5 mm. No Perez ureteral stones. 7 rate stones are seen in the left kidney measuring up to about 3 mm.  The patient also has a 7 x 6 x 8 mm stone in the left renal pelvis near the UPJ. Mild fullness noted left intrarenal collecting system without overt hydronephrosis. No left ureteral stone. No bladder stones. Stomach/Bowel: Tiny hiatal hernia. Stomach moderately distended with fluid. Duodenum is normally positioned as is the ligament of Treitz. No small bowel wall thickening. No small bowel dilatation. The terminal ileum is normal. Nonvisualization of the appendix is consistent with the reported history of appendectomy. A few scattered diverticuli are seen in the left colon without diverticulitis. Vascular/Lymphatic: No abdominal aortic aneurysm. No abdominal aortic atherosclerotic calcification. There is no gastrohepatic or hepatoduodenal ligament lymphadenopathy. No retroperitoneal or mesenteric lymphadenopathy. No pelvic sidewall lymphadenopathy. Reproductive: Unremarkable. Other: No intraperitoneal free fluid. Musculoskeletal: No worrisome lytic or sclerotic osseous abnormality. IMPRESSION: 1. 7 x 6 x 8 mm stone in the left renal pelvis near the UPJ with mild fullness of the left intrarenal collecting system but no overt hydronephrosis. 2. Bilateral nonobstructing renal stones. 3. Tiny hiatal hernia. 4. Hepatic  steatosis. Electronically Signed   By: Kennith Perez M.D.   On: 12/06/2021 10:07   DG Chest 2 View  Result Date: 12/06/2021 CLINICAL DATA:  Chest and abdominal pain in a 66 year old female. EXAM: CHEST - 2 VIEW COMPARISON:  October 15, 2011. FINDINGS: EKG leads project over the chest. Cardiomediastinal contours and hilar structures are normal. Lungs are clear. No sign of pleural effusion. On limited assessment no acute skeletal findings. Mild curvature of the spine is similar with dextroconvex component in the thoracic spine. IMPRESSION: No acute cardiopulmonary disease. Electronically Signed   By: Donzetta Kohut M.D.   On: 12/06/2021 09:05     Assessment and Plan:   NSTEMI - Pt currently pain-free on heparin, she has had ASA w/ no problems (has had rx to an NSAID in the past) - add nitro paste if she has recurrent pain - continue to cycle ez to their peak - Cardiac catheterization was discussed with the patient fully. The patient understands that risks include but are not limited to stroke (1 in 1000), death (1 in 1000), kidney failure [usually temporary] (1 in 500), bleeding (1 in 200), allergic reaction [possibly serious] (1 in 200).  The patient and husband understand and are willing to proceed. - she is on the board for cath tomorrow - hydrate since her Cr is up a little, losartan is on hold - echo ordered  2. HLD - ck profile and continue home Lipitor 40 mg qd unless LDL above goal  3. HTN - pta on Losartan 25 mg qd >> held for cath    Risk Assessment/Risk Scores:     TIMI Risk Score for Unstable Angina or Non-ST Elevation MI:   The patient's TIMI risk score is 3, which indicates a 13% risk of all cause mortality, new or recurrent myocardial infarction or need for urgent revascularization in the next 14 days. For questions or updates, please contact [place] [facility] Please consult [email or web address] for contact info under  Signed, Theodore Demark, PA-C  12/06/2021 5:55 PM  Patient  seen and examined, note reviewed with the signed [facility] Provider. I personally reviewed laboratory data, imaging studies and relevant notes. I independently examined the patient and formulated the important aspects of the plan. I have personally discussed the plan with the patient and/or family. Comments or changes to the note/plan are indicated below.  Patient seen and examined at her bedside. Her husband was present during the time of my  visit.  No reports of chest pain currently has resolved.  NSTEMI Hyperlipidemia Hypertension   Plan for left heart cath tomorrow morning. Discussed with patient is in agreement.  Shared Decision Making/Informed Consent The risks [stroke (1 in 1000), death (1 in 1000), kidney failure [usually temporary] (1 in 500), bleeding (1 in 200), allergic reaction [possibly serious] (1 in 200)], benefits (diagnostic support and management of coronary artery disease) and alternatives of a cardiac catheterization were discussed in detail with Jade Perez and she is willing to proceed.   Continue heparin gtt, continue aspirin, continue home lipitor, start low dose lopressor 12.5 mg BID Ok to trend trop to peak - once starts to trend down no more repeats.  Fu lipid profile    Thomasene Ripple DO, MS [facility] Attending Cardiologist [facility]  [address] [place], [place] [address] [phone] Website: [email or web address]

## 2021-12-06 NOTE — Consult Note (Addendum)
Cardiology Consultation   Patient ID: Jade Perez MRN: [id number]; DOB: [birth year]  Admit date: 12/06/2021 Date of Consult: 12/06/2021  PCP:  Abner GreenspanHodges, Beth, MD   [facility] Providers Cardiologist:  None  new  Patient Profile:   Jade FothergillMary Marley Motton is a 66 y.o. female with a hx of anemia, asthma, DM, HLD, HTN, OSA not on CPAP, who is being seen 12/06/2021 for the evaluation of NSTEMI at the request of Dr Roda ShuttersXu.  History of Present Illness:   Ms. Lacy DuverneyKerns has never had a cardiology eval.   Last pm, she had mid-epigastric that radiated up into her chest. It started about 2:30 am, associated w/ N&V, diaphoresis, weakness, +SOB. Aching, it eventually reached an 8-9/10. When it did not resolve, [facility].   In the ER, the pain felt like hunger pangs, but had no desire to eat.   She got fentanyl and phenergan, sx improved. The upper abd pain and the chest pain completely resolved. She had some mild chest pain that did not last very long, no treatment needed.  Cardiac ez were elevated. [facility] and [facility].   She has never had pain like this before. This is not like her reflux.  Occasionally, she feels her heart pounding, but she takes deep breaths and sx resolve. That did not happen today.  [place] [facility] [place] [place], both times she had to stop several times going up to catch her breath. However, she did not have any chest pain.   Past Medical History:  Diagnosis Date   Anemia    anemic in 2010   Anxiety    Arthritis    thumb and fingers, knee, right hip   Asthma    takes montelukast-when sick   Cataract    forming    Complication of anesthesia    difficulty waking up   Depression    Diabetes mellitus without complication (HCC)    type 2, diagnosed 2008   GERD (gastroesophageal reflux disease)    omeprazole and ranitidine   Heart murmur    since birth very slight   High  cholesterol    History of hiatal hernia    small   History of kidney stones    december 3- January 7th   Hypertension    Sleep apnea    [place], has but does not use CPAP, Dr. Yetta FlockHodges has results [facility]    Past Surgical History:  Procedure Laterality Date   ABDOMINAL EXPLORATION SURGERY     APPENDECTOMY     BREAST EXCISIONAL BIOPSY Left    benign   CARPAL TUNNEL RELEASE Right 05/22/2017   Procedure: RIGHT CARPAL TUNNEL RELEASE;  Surgeon: Cindee SaltKuzma, Gary, MD;  Location: [facility];  Service: Orthopedics;  Laterality: Right;   CARPAL TUNNEL RELEASE Left 03/12/2018   Procedure: LEFT CARPAL TUNNEL RELEASE;  Surgeon: Cindee SaltKuzma, Gary, MD;  Location: [facility];  Service: Orthopedics;  Laterality: Left;   COLONOSCOPY  06/04/2012   cyst removed     right breast two   DILATION AND CURETTAGE OF UTERUS     EYE SURGERY Left    laser surgery on retina for tear-done in office   LIPOMA EXCISION     LIPOMA EXCISION     right shoulder   LITHOTRIPSY  03/2017   RETINAL DETACHMENT SURGERY Right    SCLERAL  BUCKLE  10/15/2011   Procedure: SCLERAL BUCKLE;  Surgeon: Sherrie George, MD;  Location: [facility];  Service: Ophthalmology;  Laterality: Right;   SCLERAL BUCKLE PROCEDURE Right    SHOULDER ARTHROSCOPY WITH ROTATOR CUFF REPAIR Right 03/25/2019   Procedure: Right shoulder arthroscopy, subacromial decompression, distal clavicle resection, rotator cuff repair;  Surgeon: Francena Hanly, MD;  Location: [facility];  Service: Orthopedics;  Laterality: Right;    UPPER GASTROINTESTINAL ENDOSCOPY  05/25/2014   WISDOM TOOTH EXTRACTION       Home Medications:  Prior to Admission medications   Medication Sig Start Date End Date Taking? Authorizing Provider  albuterol (PROAIR HFA) 108 (90 Base) MCG/ACT inhaler Can inhale two puffs every four to six hours as needed for cough or wheeze. 06/08/20  Yes Kozlow, Alvira Philips, MD  atorvastatin (LIPITOR) 40 MG tablet Take  40 mg by mouth daily.   Yes [provider]  b complex vitamins tablet Take 1 tablet by mouth daily.   Yes [provider]  cetirizine (ZYRTEC) 10 MG tablet Take 10 mg by mouth daily.   Yes [provider]  citalopram (CELEXA) 40 MG tablet Take 40 mg by mouth daily.   Yes [provider]  dapagliflozin propanediol (FARXIGA) 10 MG TABS tablet Take 10 mg by mouth daily.   Yes [provider]  fenofibrate (TRICOR) 145 MG tablet Take 145 mg by mouth daily. 09/22/19  Yes [provider]  Ferrous Sulfate (SLOW FE PO) Take 1 tablet by mouth daily.   Yes [provider]  glimepiride (AMARYL) 1 MG tablet Take 0.5 mg by mouth 2 (two) times daily. 02/15/19  Yes [provider]  losartan (COZAAR) 25 MG tablet Take 25 mg by mouth daily.   Yes [provider]  montelukast (SINGULAIR) 10 MG tablet TAKE 1 TABLET BY MOUTH EVERY DAY 07/19/19  Yes Kozlow, Alvira Philips, MD  Multiple Vitamin (MULTIVITAMIN WITH MINERALS) TABS Take 1 tablet by mouth daily.   Yes [provider]  Omega-3 Fatty Acids (FISH OIL PO) Take 1 capsule by mouth 2 (two) times daily.    Yes [provider]  omeprazole (PRILOSEC) 40 MG capsule TAKE 1 CAPSULE BY MOUTH EVERY DAY IN THE MORNING BEFORE BREAKFAST 07/30/21  Yes Kozlow, Alvira Philips, MD  ACCU-CHEK AVIVA PLUS test strip 1 each daily. 12/03/19   [provider]  diclofenac Sodium (VOLTAREN) 1 % GEL Apply 4 g topically 4 (four) times daily. 07/03/21   Asencion Islam, DPM  FIBER PO Take 3 tablets by mouth daily.     [provider]  loratadine (CLARITIN) 10 MG tablet Take 10 mg by mouth daily. Patient not taking: Reported on 06/08/2020    [provider]  OZEMPIC, 1 MG/DOSE, 4 MG/3ML SOPN INJECT 1 MG UNDER SKIN EVERY WEEK 05/25/20   [provider]  triamcinolone (NASACORT) 55 MCG/ACT AERO nasal inhaler Place 2 sprays into the nose daily.    [provider]  Vitamin D,  Ergocalciferol, (DRISDOL) 50000 UNITS CAPS capsule Take 50,000 Units by mouth every 30 (thirty) days.     [provider]    Inpatient Medications: Scheduled Meds:  [START ON 12/07/2021] atorvastatin  40 mg Oral Daily   [START ON 12/07/2021] B-complex with vitamin C  1 tablet Oral Daily   [START ON 12/07/2021] citalopram  40 mg Oral Daily   [START ON 12/07/2021] dapagliflozin propanediol  10 mg Oral Daily   [START ON 12/07/2021] fenofibrate  160 mg Oral  Daily   [START ON 12/07/2021] insulin aspart  0-15 Units Subcutaneous TID WC   loratadine  10 mg Oral Daily   montelukast  10 mg Oral QHS   pantoprazole  20 mg Oral Daily   Continuous Infusions:  sodium chloride     heparin 900 Units/hr (12/06/21 1111)   magnesium sulfate bolus IVPB 2 g (12/06/21 1747)   PRN Meds:   Allergies:    Allergies  Allergen Reactions   Amoxicillin Hives    Did it involve swelling of the face/tongue/throat, SOB, or low BP? No Did it involve sudden or severe rash/hives, skin peeling, or any reaction on the inside of your mouth or nose? No Did you need to seek medical attention at a hospital or doctor's office? No When did it last happen?      15-20 years ago If all above answers are "NO", may proceed with cephalosporin use.    Bee Venom    Biaxin [Clarithromycin]    Erythromycin Nausea Only and Other (See Comments)    Stomach pain   Ibuprofen Swelling   Other     Bee Stings   Percocet [Oxycodone-Acetaminophen] Itching   Sulfa Antibiotics Hives   Latex Rash   Tape Rash    & bandaids - blisters    Social History:   Social History   Socioeconomic History   Marital status: Married    Spouse name: Not on file   Number of children: Not on file   Years of education: Not on file   Highest education level: Not on file  Occupational History   Not on file  Tobacco Use   Smoking status: Never   Smokeless tobacco: Never  Vaping Use   Vaping Use: Never used  Substance and Sexual Activity   Alcohol  use: No   Drug use: No   Sexual activity: Not Currently  Other Topics Concern   Not on file  Social History Narrative   ** Merged History Encounter **       Social Determinants of Health   Financial Resource Strain: Not on file  Food Insecurity: Not on file  Transportation Needs: Not on file  Physical Activity: Not on file  Stress: Not on file  Social Connections: Not on file  Intimate Partner Violence: Not on file    Family History:   Family History  Problem Relation Age of Onset   Unexplained death Sister 44       died in her sleep   Colon cancer Paternal Grandmother    Colon cancer Paternal Uncle    Allergic rhinitis Neg Hx    Angioedema Neg Hx    Asthma Neg Hx    Atopy Neg Hx    Eczema Neg Hx    Immunodeficiency Neg Hx    Urticaria Neg Hx    Colon polyps Neg Hx    Esophageal cancer Neg Hx    Stomach cancer Neg Hx    Rectal cancer Neg Hx    Breast cancer Neg Hx      ROS:  Please see the history of present illness.  All other ROS reviewed and negative.     Physical Exam/Data:   Vitals:   12/06/21 1400 12/06/21 1425 12/06/21 1522 12/06/21 1604  BP: (!) 110/54  136/66 128/70  Pulse: 66  62 62  Resp: 14  16 18   Temp:  98.5 F (36.9 C)  98.6 F (37 C)  TempSrc:  Oral  Oral  SpO2: 93%  100% 99%  Weight:      Height:        Intake/Output Summary (Last 24 hours) at 12/06/2021 1755 Last data filed at 12/06/2021 1145 Gross per 24 hour  Intake 1099 ml  Output --  Net 1099 ml      12/06/2021    8:31 AM 06/08/2020   11:04 AM 01/12/2020    7:49 AM  Last 3 Weights  Weight (lbs) 195 lb 202 lb 3.2 oz 197 lb  Weight (kg) 88.451 kg 91.717 kg 89.359 kg     Body mass index is 35.67 kg/m.  General:  Well nourished, well developed, in no acute distress HEENT: normal Neck: no JVD Vascular: No carotid bruits; Distal pulses 2+ bilaterally Cardiac:  normal S1, S2; RRR; no murmur  Lungs:  clear to auscultation bilaterally, no wheezing, rhonchi or rales  Abd: soft,  nontender, no hepatomegaly  Ext: no edema Musculoskeletal:  No deformities, BUE and BLE strength normal and equal Skin: warm and dry  Neuro:  CNs 2-12 intact, no focal abnormalities noted Psych:  Normal affect   EKG:  The EKG was personally reviewed and demonstrates:  SR, HR 68, no ST changes, no pathologic Q waves, no change from 2020 Telemetry:  Telemetry was personally reviewed and demonstrates:  SR  Relevant CV Studies:  ECHO: ordered  Laboratory Data:  High Sensitivity Troponin:   Recent Labs  Lab 12/06/21 0849 12/06/21 1053  TROPONINIHS 1,124* 2,511*     Chemistry Recent Labs  Lab 12/06/21 0834  NA 135  K 3.6  CL 101  CO2 22  GLUCOSE 220*  BUN 19  CREATININE 1.15*  CALCIUM 9.9  MG 1.5*  GFRNONAA 52*  ANIONGAP 12    Recent Labs  Lab 12/06/21 0834  PROT 7.8  ALBUMIN 4.5  AST 36  ALT 30  ALKPHOS 38  BILITOT 0.9   Lipids No results found for: "CHOL", "HDL", "LDLCALC", "LDLDIRECT", "TRIG", "CHOLHDL"   Hematology Recent Labs  Lab 12/06/21 0834  WBC 12.7*  RBC 4.75  HGB 14.5  HCT 42.5  MCV 89.5  MCH 30.5  MCHC 34.1  RDW 12.5  PLT 336   Thyroid No results for input(s): "TSH", "FREET4" in the last 168 hours.  BNPNo results for input(s): "BNP", "PROBNP" in the last 168 hours.  DDimer No results for input(s): "DDIMER" in the last 168 hours.   Radiology/Studies:  CT Renal Stone Study  Result Date: 12/06/2021 CLINICAL DATA:  Flank pain.  Kidney stones suspected. EXAM: CT ABDOMEN AND PELVIS WITHOUT CONTRAST TECHNIQUE: Multidetector CT imaging of the abdomen and pelvis was performed following the standard protocol without IV contrast. RADIATION DOSE REDUCTION: This exam was performed according to the departmental dose-optimization program which includes automated exposure control, adjustment of the mA and/or kV according to patient size and/or use of iterative reconstruction technique. COMPARISON:  None Available. FINDINGS: Lower chest: Unremarkable.  Hepatobiliary: The liver shows diffusely decreased attenuation suggesting fat deposition. No suspicious focal abnormality in the liver on this study without intravenous contrast. There is no evidence for gallstones, gallbladder wall thickening, or pericholecystic fluid. No intrahepatic or extrahepatic biliary dilation. Pancreas: No focal mass lesion. No dilatation of the main duct. No intraparenchymal cyst. No peripancreatic edema. Spleen: No splenomegaly. No focal mass lesion. Adrenals/Urinary Tract: No adrenal nodule or mass. Fiber 6 nonobstructing stones are seen in the right kidney measuring up to 5 mm. No right ureteral stones. 7 rate stones are seen in the left kidney measuring up to about 3 mm.  The patient also has a 7 x 6 x 8 mm stone in the left renal pelvis near the UPJ. Mild fullness noted left intrarenal collecting system without overt hydronephrosis. No left ureteral stone. No bladder stones. Stomach/Bowel: Tiny hiatal hernia. Stomach moderately distended with fluid. Duodenum is normally positioned as is the ligament of Treitz. No small bowel wall thickening. No small bowel dilatation. The terminal ileum is normal. Nonvisualization of the appendix is consistent with the reported history of appendectomy. A few scattered diverticuli are seen in the left colon without diverticulitis. Vascular/Lymphatic: No abdominal aortic aneurysm. No abdominal aortic atherosclerotic calcification. There is no gastrohepatic or hepatoduodenal ligament lymphadenopathy. No retroperitoneal or mesenteric lymphadenopathy. No pelvic sidewall lymphadenopathy. Reproductive: Unremarkable. Other: No intraperitoneal free fluid. Musculoskeletal: No worrisome lytic or sclerotic osseous abnormality. IMPRESSION: 1. 7 x 6 x 8 mm stone in the left renal pelvis near the UPJ with mild fullness of the left intrarenal collecting system but no overt hydronephrosis. 2. Bilateral nonobstructing renal stones. 3. Tiny hiatal hernia. 4. Hepatic  steatosis. Electronically Signed   By: Kennith Center M.D.   On: 12/06/2021 10:07   DG Chest 2 View  Result Date: 12/06/2021 CLINICAL DATA:  Chest and abdominal pain in a 66 year old female. EXAM: CHEST - 2 VIEW COMPARISON:  October 15, 2011. FINDINGS: EKG leads project over the chest. Cardiomediastinal contours and hilar structures are normal. Lungs are clear. No sign of pleural effusion. On limited assessment no acute skeletal findings. Mild curvature of the spine is similar with dextroconvex component in the thoracic spine. IMPRESSION: No acute cardiopulmonary disease. Electronically Signed   By: Donzetta Kohut M.D.   On: 12/06/2021 09:05     Assessment and Plan:   NSTEMI - Pt currently pain-free on heparin, she has had ASA w/ no problems (has had rx to an NSAID in the past) - add nitro paste if she has recurrent pain - continue to cycle ez to their peak - Cardiac catheterization was discussed with the patient fully. The patient understands that risks include but are not limited to stroke (1 in 1000), death (1 in 1000), kidney failure [usually temporary] (1 in 500), bleeding (1 in 200), allergic reaction [possibly serious] (1 in 200).  The patient and husband understand and are willing to proceed. - she is on the board for cath tomorrow - hydrate since her Cr is up a little, losartan is on hold - echo ordered  2. HLD - ck profile and continue home Lipitor 40 mg qd unless LDL above goal  3. HTN - pta on Losartan 25 mg qd >> held for cath    Risk Assessment/Risk Scores:     TIMI Risk Score for Unstable Angina or Non-ST Elevation MI:   The patient's TIMI risk score is 3, which indicates a 13% risk of all cause mortality, new or recurrent myocardial infarction or need for urgent revascularization in the next 14 days. For questions or updates, please contact [facility] Please consult [email or web address] for contact info under  Signed, Theodore Demark, PA-C  12/06/2021 5:55 PM  Patient  seen and examined, note reviewed with the signed Advanced Practice Provider. I personally reviewed laboratory data, imaging studies and relevant notes. I independently examined the patient and formulated the important aspects of the plan. I have personally discussed the plan with the patient and/or family. Comments or changes to the note/plan are indicated below.  Patient seen and examined at her bedside. Her husband was present during the time of my  visit.  No reports of chest pain currently has resolved.  NSTEMI Hyperlipidemia Hypertension   Plan for left heart cath tomorrow morning. Discussed with patient is in agreement.  Shared Decision Making/Informed Consent The risks [stroke (1 in 1000), death (1 in 1000), kidney failure [usually temporary] (1 in 500), bleeding (1 in 200), allergic reaction [possibly serious] (1 in 200)], benefits (diagnostic support and management of coronary artery disease) and alternatives of a cardiac catheterization were discussed in detail with Ms. Elie and she is willing to proceed.   Continue heparin gtt, continue aspirin, continue home lipitor, start low dose lopressor 12.5 mg BID Ok to trend trop to peak - once starts to trend down no more repeats.  Fu lipid profile    Deaven Barron DO, MS FACC Attending Cardiologist [place]  [facility]  [address] [place],  [address] [phone] Website: [email or web address]   

## 2021-12-06 NOTE — ED Provider Notes (Addendum)
[facility] EMERGENCY DEPARTMENT Provider Note   CSN: [id number] Arrival date & time: 12/06/21  [id number]     History  Chief Complaint  Patient presents with   Abdominal Pain    Jade Perez is a 66 y.o. female.  HPI Patient had a very busy day yesterday doing a lot of household chores and was feeling well.  Late in the evening at 1 AM she was doing bills.  She had been sitting at her desk for a while and went to get up and very suddenly had severe pain in her lower back and abdomen.  She reports that it was sharp and intense and doubled her over.  It seemed to settle most into the left lower and central abdomen.  She denies associated radiation into the legs or weakness or loss of sensation in the legs.  She reports that the pain kept escalating overnight and then she became very nauseated and vomited multiple times in the early morning hours.  She reports by the time of her arrival she feels generally extremely weak, washed out and nauseated.  Patient denies she had any associated chest pain, shortness of breath or syncope.  Patient reports a very distant history of a kidney stone.  She has diabetes and high cholesterol but no history of heart disease that she knows of.  No prior heart attack.    Home Medications Prior to Admission medications   Medication Sig Start Date End Date Taking? Authorizing Provider  albuterol (PROAIR HFA) 108 (90 Base) MCG/ACT inhaler Can inhale two puffs every four to six hours as needed for cough or wheeze. 06/08/20  Yes Kozlow, Alvira PhilipsEric J, MD  atorvastatin (LIPITOR) 40 MG tablet Take 40 mg by mouth daily.   Yes [provider]  b complex vitamins tablet Take 1 tablet by mouth daily.   Yes [provider]  cetirizine (ZYRTEC) 10 MG tablet Take 10 mg by mouth daily.   Yes [provider]  citalopram (CELEXA) 40 MG tablet Take 40 mg by mouth daily.   Yes [provider]  dapagliflozin propanediol (FARXIGA) 10 MG TABS  tablet Take 10 mg by mouth daily.   Yes [provider]  fenofibrate (TRICOR) 145 MG tablet Take 145 mg by mouth daily. 09/22/19  Yes [provider]  Ferrous Sulfate (SLOW FE PO) Take 1 tablet by mouth daily.   Yes [provider]  glimepiride (AMARYL) 1 MG tablet Take 0.5 mg by mouth 2 (two) times daily. 02/15/19  Yes [provider]  losartan (COZAAR) 25 MG tablet Take 25 mg by mouth daily.   Yes [provider]  montelukast (SINGULAIR) 10 MG tablet TAKE 1 TABLET BY MOUTH EVERY DAY 07/19/19  Yes Kozlow, Alvira PhilipsEric J, MD  Multiple Vitamin (MULTIVITAMIN WITH MINERALS) TABS Take 1 tablet by mouth daily.   Yes [provider]  Omega-3 Fatty Acids (FISH OIL PO) Take 1 capsule by mouth 2 (two) times daily.    Yes [provider]  omeprazole (PRILOSEC) 40 MG capsule TAKE 1 CAPSULE BY MOUTH EVERY DAY IN THE MORNING BEFORE BREAKFAST 07/30/21  Yes Kozlow, Alvira PhilipsEric J, MD  ACCU-CHEK AVIVA PLUS test strip 1 each daily. 12/03/19   [provider]  diclofenac Sodium (VOLTAREN) 1 % GEL Apply 4 g topically 4 (four) times daily. 07/03/21   Asencion IslamStover, Titorya, DPM  FIBER PO Take 3 tablets by mouth daily.     [provider]  loratadine (CLARITIN) 10 MG tablet Take  10 mg by mouth daily. Patient not taking: Reported on 06/08/2020    [provider]  OZEMPIC, 1 MG/DOSE, 4 MG/3ML SOPN INJECT 1 MG UNDER SKIN EVERY WEEK 05/25/20   [provider]  triamcinolone (NASACORT) 55 MCG/ACT AERO nasal inhaler Place 2 sprays into the nose daily.    [provider]  Vitamin D, Ergocalciferol, (DRISDOL) 50000 UNITS CAPS capsule Take 50,000 Units by mouth every 30 (thirty) days.     [provider]      Allergies    Amoxicillin, Aspirin, Bee venom, Biaxin [clarithromycin], Erythromycin, Other, Percocet [oxycodone-acetaminophen], Sulfa antibiotics, Latex, and Tape    Review of Systems   Review of Systems 10 systems reviewed negative  except as per HPI Physical Exam Updated Vital Signs BP 109/66   Pulse 69   Temp 97.6 F (36.4 C)   Resp 20   Ht 5\' 2"  (1.575 m)   Wt 88.5 kg   SpO2 98%   BMI 35.67 kg/m  Physical Exam Constitutional:      Comments: Patient is alert.  Mental status clear.  No respiratory distress.  She is very uncomfortable and nauseated in appearance.  HENT:     Mouth/Throat:     Pharynx: Oropharynx is clear.  Eyes:     Extraocular Movements: Extraocular movements intact.  Cardiovascular:     Rate and Rhythm: Normal rate.     Comments: Heart rate normal with occasional variability.  Monitor shows sinus rhythm ranging from 50s to 70s with sinus arrhythmia.  All QRS complexes are consistent.  Femoral pulses are 2+ and symmetric. Pulmonary:     Effort: Pulmonary effort is normal.     Breath sounds: Normal breath sounds.  Abdominal:     Comments: Abdomen is soft.  Patient denies that pain is significantly reproducible to palpation but it is exacerbating her nausea and general discomfort.  No guarding.  Musculoskeletal:        General: No swelling or tenderness. Normal range of motion.     Right lower leg: No edema.     Left lower leg: No edema.  Skin:    General: Skin is warm and dry.  Neurological:     General: No focal deficit present.     Mental Status: She is oriented to person, place, and time.     Motor: No weakness.     Coordination: Coordination normal.  Psychiatric:        Mood and Affect: Mood normal.     ED Results / Procedures / Treatments   Labs (all labs ordered are listed, but only abnormal results are displayed) Labs Reviewed  COMPREHENSIVE METABOLIC PANEL - Abnormal; Notable for the following components:      Result Value   Glucose, Bld 220 (*)    Creatinine, Ser 1.15 (*)    GFR, Estimated 52 (*)    All other components within normal limits  CBC - Abnormal; Notable for the following components:   WBC 12.7 (*)    All other components within normal limits   URINALYSIS, ROUTINE W REFLEX MICROSCOPIC - Abnormal; Notable for the following components:   Glucose, UA >=500 (*)    Hgb urine dipstick MODERATE (*)    Ketones, ur 40 (*)    Protein, ur 30 (*)    All other components within normal limits  MAGNESIUM - Abnormal; Notable for the following components:   Magnesium 1.5 (*)    All other components within normal limits  LACTIC ACID, PLASMA -  Abnormal; Notable for the following components:   Lactic Acid, Venous 3.5 (*)    All other components within normal limits  URINALYSIS, MICROSCOPIC (REFLEX) - Abnormal; Notable for the following components:   Bacteria, UA FEW (*)    Non Squamous Epithelial PRESENT (*)    All other components within normal limits  TROPONIN I (HIGH SENSITIVITY) - Abnormal; Notable for the following components:   Troponin I (High Sensitivity) 1,124 (*)    All other components within normal limits  LIPASE, BLOOD  LACTIC ACID, PLASMA  HEPARIN LEVEL (UNFRACTIONATED)  TROPONIN I (HIGH SENSITIVITY)    EKG EKG Interpretation  Date/Time:  Thursday December 06 2021 08:36:35 EDT Ventricular Rate:  60 PR Interval:  158 QRS Duration: 87 QT Interval:  501 QTC Calculation: 501 R Axis:   71 Text Interpretation: Sinus arrhythmia Prolonged QT interval agree. QT longer compared to previous.no acute ischemic changes. Confirmed by Arby Barrette [phone]) on 12/06/2021 8:41:02 AM  Radiology CT Renal Stone Study  Result Date: 12/06/2021 CLINICAL DATA:  Flank pain.  Kidney stones suspected. EXAM: CT ABDOMEN AND PELVIS WITHOUT CONTRAST TECHNIQUE: Multidetector CT imaging of the abdomen and pelvis was performed following the standard protocol without IV contrast. RADIATION DOSE REDUCTION: This exam was performed according to the departmental dose-optimization program which includes automated exposure control, adjustment of the mA and/or kV according to patient size and/or use of iterative reconstruction technique. COMPARISON:  None  Available. FINDINGS: Lower chest: Unremarkable. Hepatobiliary: The liver shows diffusely decreased attenuation suggesting fat deposition. No suspicious focal abnormality in the liver on this study without intravenous contrast. There is no evidence for gallstones, gallbladder wall thickening, or pericholecystic fluid. No intrahepatic or extrahepatic biliary dilation. Pancreas: No focal mass lesion. No dilatation of the main duct. No intraparenchymal cyst. No peripancreatic edema. Spleen: No splenomegaly. No focal mass lesion. Adrenals/Urinary Tract: No adrenal nodule or mass. Fiber 6 nonobstructing stones are seen in the right kidney measuring up to 5 mm. No right ureteral stones. 7 rate stones are seen in the left kidney measuring up to about 3 mm. The patient also has a 7 x 6 x 8 mm stone in the left renal pelvis near the UPJ. Mild fullness noted left intrarenal collecting system without overt hydronephrosis. No left ureteral stone. No bladder stones. Stomach/Bowel: Tiny hiatal hernia. Stomach moderately distended with fluid. Duodenum is normally positioned as is the ligament of Treitz. No small bowel wall thickening. No small bowel dilatation. The terminal ileum is normal. Nonvisualization of the appendix is consistent with the reported history of appendectomy. A few scattered diverticuli are seen in the left colon without diverticulitis. Vascular/Lymphatic: No abdominal aortic aneurysm. No abdominal aortic atherosclerotic calcification. There is no gastrohepatic or hepatoduodenal ligament lymphadenopathy. No retroperitoneal or mesenteric lymphadenopathy. No pelvic sidewall lymphadenopathy. Reproductive: Unremarkable. Other: No intraperitoneal free fluid. Musculoskeletal: No worrisome lytic or sclerotic osseous abnormality. IMPRESSION: 1. 7 x 6 x 8 mm stone in the left renal pelvis near the UPJ with mild fullness of the left intrarenal collecting system but no overt hydronephrosis. 2. Bilateral nonobstructing  renal stones. 3. Tiny hiatal hernia. 4. Hepatic steatosis. Electronically Signed   By: Kennith Center M.D.   On: 12/06/2021 10:07   DG Chest 2 View  Result Date: 12/06/2021 CLINICAL DATA:  Chest and abdominal pain in a 66 year old female. EXAM: CHEST - 2 VIEW COMPARISON:  October 15, 2011. FINDINGS: EKG leads project over the chest. Cardiomediastinal contours and hilar structures are normal. Lungs are clear. No sign  of pleural effusion. On limited assessment no acute skeletal findings. Mild curvature of the spine is similar with dextroconvex component in the thoracic spine. IMPRESSION: No acute cardiopulmonary disease. Electronically Signed   By: Donzetta Kohut M.D.   On: 12/06/2021 09:05    Procedures Procedures   CRITICAL CARE Performed by: Arby Barrette   Total critical care time: 60 minutes  Critical care time was exclusive of separately billable procedures and treating other patients.  Critical care was necessary to treat or prevent imminent or life-threatening deterioration.  Critical care was time spent personally by me on the following activities: development of treatment plan with patient and/or surrogate as well as nursing, discussions with consultants, evaluation of patient's response to treatment, examination of patient, obtaining history from patient or surrogate, ordering and performing treatments and interventions, ordering and review of laboratory studies, ordering and review of radiographic studies, pulse oximetry and re-evaluation of patient's condition.   Angiocath insertion Performed by: Arby Barrette  Consent: Verbal consent obtained. Risks and benefits: risks, benefits and alternatives were discussed Time out: Immediately prior to procedure a "time out" was called to verify the correct patient, procedure, equipment, support staff and site/side marked as required.  Preparation: Patient was prepped and draped in the usual sterile fashion.  Vein Location: left AC  Yes  Ultrasound Guided  Gauge: 20 long  Cath visualized within the vein.  Threaded without difficulty but did not withdraw blood.  Ultrasound used to visualize the catheter which was just outside the vein at the tip.  I was able to withdraw the catheter under visualization and get good blood return.  At that point flush used to advance the catheter without difficulty and easy flush with catheter visualized in vein.   Medications Ordered in ED Medications  potassium chloride 10 mEq in 100 mL IVPB (10 mEq Intravenous New Bag/Given 12/06/21 1036)  fentaNYL (SUBLIMAZE) injection 50 mcg (has no administration in time range)  heparin bolus via infusion 4,000 Units (has no administration in time range)  heparin ADULT infusion 100 units/mL (25000 units/247mL) (has no administration in time range)  promethazine (PHENERGAN) 12.5 mg in sodium chloride 0.9 % 50 mL IVPB (12.5 mg Intravenous New Bag/Given 12/06/21 0919)  fentaNYL (SUBLIMAZE) injection 50 mcg (50 mcg Intravenous Given 12/06/21 0918)  lactated ringers bolus 1,000 mL (0 mLs Intravenous Stopped 12/06/21 1020)  promethazine (PHENERGAN) 25 MG/ML injection (  Return to [facility] 12/06/21 1028)  aspirin chewable tablet 324 mg (324 mg Oral Given 12/06/21 1107)    ED Course/ Medical Decision Making/ A&P                           Medical Decision Making Amount and/or Complexity of Data Reviewed Labs: ordered. Radiology: ordered.  Risk OTC drugs. Prescription drug management. Decision regarding hospitalization.  Patient presents with severe lower abdominal and back pain.  Patient was extremely nauseated and felt generally weak upon arrival.  Differential diagnosis included aortic aneurysm\dissection\kidney stone\bowel obstruction.  Peripheral IV started by nursing in the hand but had difficulty with establishing in the forearm.  Patient was given Phenergan and fentanyl with excellent response.  I had reviewed patient's EKG on arrival and no ischemic  changes present however she did have QT prolongation and sinus arrhythmia.  Once pain controlled and hydrated, heart rate has remained in the 70s and would pressure stable.  Patient denied any chest pain at any time.  9: 40 recheck patient reports she feels  much improved.  Nausea has improved and pain improved.  Patient is comfortable blood pressures and heart rate are stable.  Troponin resulted with significant elevation at 1,100.  This result was unexpected with patient not complaining of any chest pain or shortness of breath.  EKG does not have any ischemic changes.  I suspect patient experienced physiological stress in response to severe pain associated with a large kidney stone and nausea and vomiting and is experiencing a silent MI.    I have reviewed this with cardiology, Dr. Servando Salina.  She has reviewed the patient's history and results.  At this time recommendation is to initiate heparin.  Patient reported allergy to aspirin however she reports her allergy was to NSAIDs and she got swollen ankles 1 time and then 1 time got some hives.  She had taken aspirin as a child and never had adverse reaction.  At this time we will proceed with aspirin administration.  Patient is well pain controlled with fentanyl and Phenergan for the kidney stone.  Low suspicion for sepsis.  Patient has no hypotension, no fever, normal heart rate and urinalysis positive for blood but does not appear grossly infected.  I suspect elevation in lactic acid was secondary to severe vomiting and pain and possibly hypoperfusion associated with silent MI and conjunction with acute volume loss.  Patient is being rehydrated with lactated Ringer's and is well in appearance.  At no point she endorsed any chest pain or dyspnea.  We will continue to monitor closely.  Plan for admission.  Consult: Reviewed with Dr. Ophelia Charter for admission.  13: 05 recheck.  Patient is alert in no distress.  She reports she feels very comfortable.  No pain, no  shortness of breath.  Troponin has elevated.   Final Clinical Impression(s) / ED Diagnoses Final diagnoses:  Kidney stone  NSTEMI (non-ST elevated myocardial infarction) [facility])    Rx / DC Orders ED Discharge Orders     None         Arby Barrette, MD 12/06/21 1118    Arby Barrette, MD 12/06/21 1144    Arby Barrette, MD 12/06/21 1306    Arby Barrette, MD 12/06/21 1432

## 2021-12-06 NOTE — ED Triage Notes (Signed)
Pt reports she woke up with nausea and lower back pain at 2 am. Complaining of nausea, feeling bloated the patient reports vomiting since 0230. Pain also in abdomen

## 2021-12-06 NOTE — Progress Notes (Signed)
Plan of Care Note for accepted transfer   Patient: Jade Perez MRN: [id number]   DOA: 12/06/2021  Facility requesting transfer: [facility] Requesting Provider: Pheiffer Reason for transfer: NSTEMI, nephrolithiasis Facility course: Patient with h/o HTN, HLD, and OSA not on CPAP presenting with n/v.  Presented with typical kidney stone presentation, severe pain and n/v.  No CP/SOB.  EKG with prolonged QT on arrival, given Phenergan and Fentanyl with marked improvement.  Troponin 1100 -> 2511.  D/W cardiology - started heparin.  Basically NSTEMI from failed stress test due to pain and n/v.  Will trend troponin.  Large stones on imaging, will call urology - but likely needs NSTEMI management first..  Will admit to cardiac telemetry.   Plan of care: The patient is accepted for admission to Telemetry unit, at [facility].  She will need cardiology and urology consults upon arrival.    Author: Jonah Blue, MD 12/06/2021  Check [email or web address] for on-call coverage.  Nursing staff, Please call [facility] Admits & Consults System-Wide number on Amion as soon as patient's arrival, so appropriate admitting provider can evaluate the pt.

## 2021-12-06 NOTE — Progress Notes (Signed)
ANTICOAGULATION CONSULT NOTE - Initial Consult  Pharmacy Consult for heparin Indication: chest pain/ACS  Allergies  Allergen Reactions   Amoxicillin Hives    Did it involve swelling of the face/tongue/throat, SOB, or low BP? No Did it involve sudden or severe rash/hives, skin peeling, or any reaction on the inside of your mouth or nose? No Did you need to seek medical attention at a hospital or doctor's office? No When did it last happen?      15-20 years ago If all above answers are "NO", may proceed with cephalosporin use.    Bee Venom    Biaxin [Clarithromycin]    Erythromycin Nausea Only and Other (See Comments)    Stomach pain   Ibuprofen Swelling   Other     Bee Stings   Percocet [Oxycodone-Acetaminophen] Itching   Sulfa Antibiotics Hives   Latex Rash   Tape Rash    & bandaids - blisters    Patient Measurements: Height: 5\' 2"  (157.5 cm) Weight: 88.5 kg (195 lb) IBW/kg (Calculated) : 50.1 Heparin Dosing Weight: 70.4kg  Vital Signs: Temp: 98.7 F (37.1 C) (08/31 1938) Temp Source: Oral (08/31 1938) BP: 92/76 (08/31 1938) Pulse Rate: 67 (08/31 1938)  Labs: Recent Labs    12/06/21 0834 12/06/21 0849 12/06/21 1053 12/06/21 1801  HGB 14.5  --   --   --   HCT 42.5  --   --   --   PLT 336  --   --   --   HEPARINUNFRC  --   --   --  <0.10*  CREATININE 1.15*  --   --   --   TROPONINIHS  --  1,124* 2,511*  --      Estimated Creatinine Clearance: 49.1 mL/min (A) (by C-G formula based on SCr of 1.15 mg/dL (H)).   Medical History: Past Medical History:  Diagnosis Date   Anemia    anemic in 2010   Anxiety    Arthritis    thumb and fingers, knee, right hip   Asthma    takes montelukast-when sick   Cataract    forming    Complication of anesthesia    difficulty waking up   Depression    Diabetes mellitus without complication (HCC)    type 2, diagnosed 2008   GERD (gastroesophageal reflux disease)    omeprazole and ranitidine   Heart murmur    since  birth very slight   High cholesterol    History of hiatal hernia    small   History of kidney stones    december 3- January 7th   Hypertension    Sleep apnea    sleep study in [place], has but does not use CPAP, Dr. [place] park has results [facility]    Medications:  Infusions:   sodium chloride 75 mL/hr at 12/06/21 1807   sodium chloride     sodium chloride 1 mL/kg/hr (12/06/21 2057)   heparin 900 Units/hr (12/06/21 1111)    Assessment: 66 yof presented to the ED with nausea and back pain. Troponin elevated and now starting IV heparin. Baseline CBC is WNL. She is not on anticoagulation PTA. Planning for cardiac cath tomorrow 9/1.   HL undetectable <0.10 on 8/31 @1801 . Per RN no issues and no signs of bleeding  Goal of Therapy:  Heparin level 0.3-0.7 units/ml Monitor platelets by anticoagulation protocol: Yes   Plan:  Heparin bolus 2000 units x1 then  Increase heparin gtt to 1100 units/hr Check a 6 hr  heparin level on 9/1 @0400  Daily heparin level and CBC   , PharmD Clinical Pharmacist 12/06/2021 9:35 PM

## 2021-12-06 NOTE — ED Notes (Signed)
Report given to [facility] for continuation of care. No acute distress noted upon this Rns departure of Patient.

## 2021-12-06 NOTE — ED Notes (Signed)
Patient brought to restroom via wc at this time by radiology

## 2021-12-07 ENCOUNTER — Inpatient Hospital Stay (HOSPITAL_COMMUNITY): Payer: Medicare PPO

## 2021-12-07 ENCOUNTER — Encounter (HOSPITAL_COMMUNITY)
Admission: EM | Disposition: A | Discharge status: Home / Self Care | Payer: Self-pay | Source: Home / Self Care | Attending: Internal Medicine

## 2021-12-07 ENCOUNTER — Other Ambulatory Visit: Payer: Self-pay | Admitting: Cardiology

## 2021-12-07 DIAGNOSIS — E785 Hyperlipidemia, unspecified: Secondary | ICD-10-CM | POA: Diagnosis not present

## 2021-12-07 DIAGNOSIS — I214 Non-ST elevation (NSTEMI) myocardial infarction: Secondary | ICD-10-CM

## 2021-12-07 DIAGNOSIS — N200 Calculus of kidney: Secondary | ICD-10-CM

## 2021-12-07 DIAGNOSIS — I200 Unstable angina: Secondary | ICD-10-CM

## 2021-12-07 HISTORY — PX: LEFT HEART CATH AND CORONARY ANGIOGRAPHY: CATH118249

## 2021-12-07 LAB — CBC WITH DIFFERENTIAL/PLATELET
Abs Immature Granulocytes: 0.03 10*3/uL (ref 0.00–0.07)
Basophils Absolute: 0 10*3/uL (ref 0.0–0.1)
Basophils Relative: 0 %
Eosinophils Absolute: 0.1 10*3/uL (ref 0.0–0.5)
Eosinophils Relative: 1 %
HCT: 38.9 % (ref 36.0–46.0)
Hemoglobin: 13 g/dL (ref 12.0–15.0)
Immature Granulocytes: 0 %
Lymphocytes Relative: 24 %
Lymphs Abs: 2.6 10*3/uL (ref 0.7–4.0)
MCH: 30.7 pg (ref 26.0–34.0)
MCHC: 33.4 g/dL (ref 30.0–36.0)
MCV: 91.7 fL (ref 80.0–100.0)
Monocytes Absolute: 1.2 10*3/uL — ABNORMAL HIGH (ref 0.1–1.0)
Monocytes Relative: 12 %
Neutro Abs: 6.7 10*3/uL (ref 1.7–7.7)
Neutrophils Relative %: 63 %
Platelets: 285 10*3/uL (ref 150–400)
RBC: 4.24 MIL/uL (ref 3.87–5.11)
RDW: 13.1 % (ref 11.5–15.5)
WBC: 10.7 10*3/uL — ABNORMAL HIGH (ref 4.0–10.5)
nRBC: 0 % (ref 0.0–0.2)

## 2021-12-07 LAB — GLUCOSE, CAPILLARY
Glucose-Capillary: 165 mg/dL — ABNORMAL HIGH (ref 70–99)
Glucose-Capillary: 105 mg/dL — ABNORMAL HIGH (ref 70–99)
Glucose-Capillary: 193 mg/dL — ABNORMAL HIGH (ref 70–99)
Glucose-Capillary: 127 mg/dL — ABNORMAL HIGH (ref 70–99)

## 2021-12-07 LAB — ECHOCARDIOGRAM COMPLETE
AR max vel: 1.5 cm2
AV Area VTI: 1.39 cm2
AV Area mean vel: 1.41 cm2
AV Mean grad: 3 mmHg
AV Peak grad: 6.7 mmHg
Ao pk vel: 1.29 m/s
Area-P 1/2: 3.53 cm2
Calc EF: 52.1 %
Height: 62 in
MV M vel: 1.87 m/s
MV Peak grad: 13.9 mmHg
P 1/2 time: 718 msec
S' Lateral: 3.6 cm
Single Plane A2C EF: 53.6 %
Single Plane A4C EF: 50.5 %
Weight: 3176 oz

## 2021-12-07 LAB — BASIC METABOLIC PANEL
Anion gap: 8 (ref 5–15)
BUN: 12 mg/dL (ref 8–23)
CO2: 22 mmol/L (ref 22–32)
Calcium: 8.7 mg/dL — ABNORMAL LOW (ref 8.9–10.3)
Chloride: 110 mmol/L (ref 98–111)
Creatinine, Ser: 1.01 mg/dL — ABNORMAL HIGH (ref 0.44–1.00)
GFR, Estimated: >60 mL/min (ref >60)
Glucose, Bld: 172 mg/dL — ABNORMAL HIGH (ref 70–99)
Potassium: 3.6 mmol/L (ref 3.5–5.1)
Sodium: 140 mmol/L (ref 135–145)

## 2021-12-07 LAB — LIPID PANEL
Cholesterol: 125 mg/dL (ref 0–200)
HDL: 38 mg/dL — ABNORMAL LOW (ref >40)
LDL Cholesterol: 59 mg/dL (ref 0–99)
Total CHOL/HDL Ratio: 3.3 RATIO
Triglycerides: 138 mg/dL (ref <150)
VLDL: 28 mg/dL (ref 0–40)

## 2021-12-07 LAB — MAGNESIUM: Magnesium: 1.9 mg/dL (ref 1.7–2.4)

## 2021-12-07 LAB — HEPARIN LEVEL (UNFRACTIONATED): Heparin Unfractionated: 0.25 IU/mL — ABNORMAL LOW (ref 0.30–0.70)

## 2021-12-07 LAB — HIV ANTIBODY (ROUTINE TESTING W REFLEX): HIV Screen 4th Generation wRfx: NONREACTIVE

## 2021-12-07 LAB — TROPONIN I (HIGH SENSITIVITY): Troponin I (High Sensitivity): 3305 ng/L (ref <18)

## 2021-12-07 SURGERY — LEFT HEART CATH AND CORONARY ANGIOGRAPHY
Anesthesia: LOCAL

## 2021-12-07 MED ORDER — NALOXONE HCL 0.4 MG/ML IJ SOLN
0.4000 mg | INTRAMUSCULAR | Status: DC | PRN
Start: 2021-12-07 — End: 2021-12-15

## 2021-12-07 MED ORDER — FENTANYL CITRATE (PF) 100 MCG/2ML IJ SOLN
INTRAMUSCULAR | Status: DC | PRN
  Administered 2021-12-07: 25 ug via INTRAVENOUS

## 2021-12-07 MED ORDER — SODIUM CHLORIDE 0.9% FLUSH
3.0000 mL | Freq: Two times a day (BID) | INTRAVENOUS | Status: DC
  Administered 2021-12-07 – 2021-12-15 (×12): 3 mL via INTRAVENOUS

## 2021-12-07 MED ORDER — TRIMETHOBENZAMIDE HCL 100 MG/ML IM SOLN
200.0000 mg | Freq: Three times a day (TID) | INTRAMUSCULAR | Status: DC | PRN
Start: 2021-12-07 — End: 2021-12-15
  Administered 2021-12-07: 200 mg via INTRAMUSCULAR
  Filled 2021-12-07 (×2): qty 2

## 2021-12-07 MED ORDER — LABETALOL HCL 5 MG/ML IV SOLN: 10.0000 mg | INTRAVENOUS | Status: AC | PRN

## 2021-12-07 MED ORDER — SODIUM CHLORIDE 0.9 % IV SOLN: INTRAVENOUS | Status: AC

## 2021-12-07 MED ORDER — HEPARIN SODIUM (PORCINE) 1000 UNIT/ML IJ SOLN
INTRAMUSCULAR | Status: AC
  Filled 2021-12-07: qty 10

## 2021-12-07 MED ORDER — LIDOCAINE HCL (PF) 1 % IJ SOLN
INTRAMUSCULAR | Status: AC
  Filled 2021-12-07: qty 30

## 2021-12-07 MED ORDER — FENTANYL CITRATE (PF) 100 MCG/2ML IJ SOLN
INTRAMUSCULAR | Status: AC
  Filled 2021-12-07: qty 2

## 2021-12-07 MED ORDER — VERAPAMIL HCL 2.5 MG/ML IV SOLN
INTRAVENOUS | Status: AC
  Filled 2021-12-07: qty 2

## 2021-12-07 MED ORDER — MIDAZOLAM HCL 2 MG/2ML IJ SOLN
INTRAMUSCULAR | Status: AC
  Filled 2021-12-07: qty 2

## 2021-12-07 MED ORDER — IOHEXOL 350 MG/ML SOLN
INTRAVENOUS | Status: DC | PRN
  Administered 2021-12-07: 50 mL

## 2021-12-07 MED ORDER — SODIUM CHLORIDE 0.9 % IV SOLN
INTRAVENOUS | Status: DC | PRN
  Administered 2021-12-07: 10 mL/h via INTRAVENOUS

## 2021-12-07 MED ORDER — HEPARIN (PORCINE) IN NACL 1000-0.9 UT/500ML-% IV SOLN
INTRAVENOUS | Status: DC | PRN
  Administered 2021-12-07 (×2): 500 mL

## 2021-12-07 MED ORDER — PERFLUTREN LIPID MICROSPHERE
1.0000 mL | INTRAVENOUS | Status: AC | PRN
  Administered 2021-12-07: 3 mL via INTRAVENOUS

## 2021-12-07 MED ORDER — HYDRALAZINE HCL 20 MG/ML IJ SOLN: 10.0000 mg | INTRAMUSCULAR | Status: AC | PRN

## 2021-12-07 MED ORDER — HEPARIN (PORCINE) IN NACL 1000-0.9 UT/500ML-% IV SOLN
INTRAVENOUS | Status: AC
  Filled 2021-12-07: qty 1000

## 2021-12-07 MED ORDER — HEPARIN SODIUM (PORCINE) 1000 UNIT/ML IJ SOLN
INTRAMUSCULAR | Status: DC | PRN
  Administered 2021-12-07: 4500 [IU] via INTRAVENOUS

## 2021-12-07 MED ORDER — LIDOCAINE HCL (PF) 1 % IJ SOLN
INTRAMUSCULAR | Status: DC | PRN
  Administered 2021-12-07: 2 mL

## 2021-12-07 MED ORDER — SODIUM CHLORIDE 0.9% FLUSH
3.0000 mL | INTRAVENOUS | Status: DC | PRN
  Administered 2021-12-15: 3 mL via INTRAVENOUS

## 2021-12-07 MED ORDER — MIDAZOLAM HCL 2 MG/2ML IJ SOLN
INTRAMUSCULAR | Status: DC | PRN
  Administered 2021-12-07: 1 mg via INTRAVENOUS

## 2021-12-07 MED ORDER — SODIUM CHLORIDE 0.9 % IV SOLN
1.0000 g | INTRAVENOUS | Status: DC
  Administered 2021-12-07: 1 g via INTRAVENOUS
  Filled 2021-12-07: qty 10

## 2021-12-07 MED ORDER — VERAPAMIL HCL 2.5 MG/ML IV SOLN
INTRAVENOUS | Status: DC | PRN
  Administered 2021-12-07: 10 mL via INTRA_ARTERIAL

## 2021-12-07 MED ORDER — HEPARIN SODIUM (PORCINE) 5000 UNIT/ML IJ SOLN
5000.0000 [IU] | Freq: Three times a day (TID) | INTRAMUSCULAR | Status: DC
  Administered 2021-12-07 – 2021-12-15 (×20): 5000 [IU] via SUBCUTANEOUS
  Filled 2021-12-07 (×20): qty 1

## 2021-12-07 MED ORDER — ONDANSETRON HCL 4 MG/2ML IJ SOLN
4.0000 mg | Freq: Four times a day (QID) | INTRAMUSCULAR | Status: DC | PRN
  Administered 2021-12-10 – 2021-12-13 (×6): 4 mg via INTRAVENOUS
  Filled 2021-12-07 (×6): qty 2

## 2021-12-07 MED ORDER — SODIUM CHLORIDE 0.9 % IV SOLN: 250.0000 mL | INTRAVENOUS | Status: DC | PRN

## 2021-12-07 MED ORDER — MORPHINE SULFATE (PF) 2 MG/ML IV SOLN
1.0000 mg | INTRAVENOUS | Status: DC | PRN
  Administered 2021-12-07: 1 mg via INTRAVENOUS
  Filled 2021-12-07: qty 1

## 2021-12-07 SURGICAL SUPPLY — 11 items
BAND CMPR LRG ZPHR (HEMOSTASIS) ×1
BAND ZEPHYR COMPRESS 30 LONG (HEMOSTASIS) IMPLANT
CATH 5FR JL3.5 JR4 ANG PIG MP (CATHETERS) IMPLANT
GLIDESHEATH SLEND A-KIT 6F 22G (SHEATH) IMPLANT
GUIDEWIRE INQWIRE 1.5J.035X260 (WIRE) IMPLANT
INQWIRE 1.5J .035X260CM (WIRE) ×1
KIT HEART LEFT (KITS) ×1 IMPLANT
PACK CARDIAC CATHETERIZATION (CUSTOM PROCEDURE TRAY) ×1 IMPLANT
SHEATH PROBE COVER 6X72 (BAG) IMPLANT
TRANSDUCER W/STOPCOCK (MISCELLANEOUS) ×1 IMPLANT
TUBING CIL FLEX 10 FLL-RA (TUBING) ×1 IMPLANT

## 2021-12-07 NOTE — Progress Notes (Addendum)
PROGRESS NOTE    Jade Perez  UKG:[id number] DOB: [birth year] DOA: 12/06/2021 PCP: Abner Greenspan, MD     Brief Narrative:   HLD, HTN, NIDDM2 present to [facility] due to flank pain, n/v, CT renal stone obtained showed left renal pelvis stone , no hydro , she is found to have elevated troponin, she is started on heparin drip, admitted to [facility] for cardiology eval.   Subjective:  She reports flank pain last night , received morphinex1, felt nauseous x1 this morning Currently no flank pain, no chest pain, no nausea, no vomiting, no fever  Assessment & Plan:  Principal Problem:   NSTEMI (non-ST elevated myocardial infarction) (HCC) Active Problems:   Type 2 diabetes mellitus without complication, without long-term current use of insulin (HCC)   Hypomagnesemia   Bilateral renal stones   NSTEMI S/p cardiac cath: Normal coronary arteries with right dominant anatomy. Mid anterior wall hypokinesis.  LVEDP 18 mmHg.  no work, driving or lifting for 3 days post cath per cards. Outpatient cardiology follow-up for cardiac MRI scheduled  Currently chest pain free      Renal stone Currently no pain, no n/v,  reports urine with odor, intermittent hematuria, no dysuria,  Reviewed CT renal stone result with urology , currently patient has no fever, renal function appear close to baseline, if her renal function continue to be stable, no significant persistent pain, can be followed up outpatient next week F/u on urine culture, start on rocephin, will benefit from abx for at least three days   Mild leukocytosis Does not appear septic, likely due to stress Blood culture no growth, urine culture in process Continue hydration for another day, started empiric antibiotic to cover urinary source  Hypomagnesemia Replaced and improved   NIDDM2 with hyperglycemia -Am hyperglycemia likely due to stress -a1c 6.9, -Continue  ssi -Continue farxiga    Body mass index is 36.31 kg/m.Marland Kitchen  Meet  obesity criteria     I have Reviewed nursing notes, Vitals, pain scores, I/o's, Lab results and  imaging results since pt's last encounter, details please see discussion above  I ordered the following labs:  Unresulted Labs (From admission, onward)     Start     Ordered   12/08/21 0500  CBC with Differential/Platelet  Tomorrow morning,   R       Question:  Specimen collection method  Answer:  Lab=Lab collect   12/07/21 0816   12/08/21 0500  Basic metabolic panel  Tomorrow morning,   R       Question:  Specimen collection method  Answer:  Lab=Lab collect   12/07/21 0816   12/08/21 0500  Magnesium  Tomorrow morning,   R       Question:  Specimen collection method  Answer:  Lab=Lab collect   12/07/21 0816   12/08/21 0500  Lipoprotein A (LPA)  Tomorrow morning,   R       Question:  Specimen collection method  Answer:  Lab=Lab collect   12/07/21 1352             DVT prophylaxis: heparin injection 5,000 Units Start: 12/07/21 2200   Code Status:   Code Status: Full Code  Family Communication: Husband at bedside Disposition:   Dispo: The patient is from: Home              Anticipated d/c is to: Home              Anticipated d/c date is: Likely 9/2, f/u  on urine culture result, patient is aware need urology follow up next week  Antimicrobials:   Anti-infectives (From admission, onward)    Start     Dose/Rate Route Frequency Ordered Stop   12/07/21 1545  cefTRIAXone (ROCEPHIN) 1 g in sodium chloride 0.9 % 100 mL IVPB       Note to Pharmacy: Appear to have tolerated ancef in the past, please let me know if rocephin is ok? thx   1 g 200 mL/hr over 30 Minutes Intravenous Every 24 hours 12/07/21 1456             Objective: Vitals:   12/07/21 1253 12/07/21 1258 12/07/21 1303 12/07/21 1613  BP: 131/71 130/72 129/74 117/62  Pulse: 70 76 73 68  Resp: 15 15 16 18   Temp:    97.9 F (36.6 C)  TempSrc:    Oral  SpO2: 96% 96% 97% 98%  Weight:      Height:         Intake/Output Summary (Last 24 hours) at 12/07/2021 1716 Last data filed at 12/07/2021 0406 Gross per 24 hour  Intake 1511.47 ml  Output 900 ml  Net 611.47 ml   Filed Weights   12/06/21 0831 12/07/21 0404  Weight: 88.5 kg 90 kg    Examination:  General exam: alert, awake, communicative,calm, NAD Respiratory system: Clear to auscultation. Respiratory effort normal. Cardiovascular system:  RRR.  Gastrointestinal system: Abdomen is nondistended, soft and nontender.  Normal bowel sounds heard. Central nervous system: Alert and oriented. No focal neurological deficits. Extremities:  no edema Skin: No rashes, lesions or ulcers Psychiatry: Judgement and insight appear normal. Mood & affect appropriate.     Data Reviewed: I have personally reviewed  labs and visualized  imaging studies since the last encounter and formulate the plan        Scheduled Meds:  atorvastatin  40 mg Oral Daily   B-complex with vitamin C  1 tablet Oral Daily   citalopram  40 mg Oral Daily   dapagliflozin propanediol  10 mg Oral Daily   fenofibrate  160 mg Oral Daily   heparin  5,000 Units Subcutaneous Q8H   insulin aspart  0-15 Units Subcutaneous TID WC   loratadine  10 mg Oral Daily   montelukast  10 mg Oral QHS   pantoprazole  20 mg Oral Daily   sodium chloride flush  3 mL Intravenous Q12H   Continuous Infusions:  sodium chloride Stopped (12/07/21 1419)   sodium chloride     sodium chloride 75 mL/hr at 12/07/21 1713   cefTRIAXone (ROCEPHIN)  IV       LOS: 1 day     02/06/22, MD PhD FACP [facility]  Available via Epic secure chat 7am-7pm for nonurgent issues Please page for urgent issues To page the attending provider between 7A-7P or the covering provider during after hours 7P-7A, please log into the web site [email or web address] and access using universal [place] password for that web site. If you do not have the password, please call the hospital operator.    12/07/2021, 5:16  PM

## 2021-12-07 NOTE — Interval H&P Note (Signed)
Cath Lab Visit (complete for each Cath Lab visit)  Clinical Evaluation Leading to the Procedure:   ACS: Yes.    Non-ACS:    Anginal Classification: CCS III  Anti-ischemic medical therapy: Minimal Therapy (1 class of medications)  Non-Invasive Test Results: No non-invasive testing performed  Prior CABG: No previous CABG      History and Physical Interval Note:  12/07/2021 12:33 PM  Jade Perez  has presented today for surgery, with the diagnosis of nstemi.  The various methods of treatment have been discussed with the patient and family. After consideration of risks, benefits and other options for treatment, the patient has consented to  Procedure(s): LEFT HEART CATH AND CORONARY ANGIOGRAPHY (N/A) as a surgical intervention.  The patient's history has been reviewed, patient examined, no change in status, stable for surgery.  I have reviewed the patient's chart and labs.  Questions were answered to the patient's satisfaction.     Lyn Records III

## 2021-12-07 NOTE — H&P (View-Only) (Signed)
Rounding Note    Patient Name: Jade Perez Date of Encounter: 12/07/2021  [facility] Cardiologist: Thomasene Ripple, DO   Subjective   No chest pain, she did have kidney stone pain last pm  Inpatient Medications    Scheduled Meds:  atorvastatin  40 mg Oral Daily   B-complex with vitamin C  1 tablet Oral Daily   citalopram  40 mg Oral Daily   dapagliflozin propanediol  10 mg Oral Daily   fenofibrate  160 mg Oral Daily   insulin aspart  0-15 Units Subcutaneous TID WC   loratadine  10 mg Oral Daily   montelukast  10 mg Oral QHS   pantoprazole  20 mg Oral Daily   sodium chloride flush  3 mL Intravenous Q12H   Continuous Infusions:  sodium chloride 75 mL/hr at 12/06/21 1807   sodium chloride     sodium chloride Stopped (12/07/21 0852)   heparin 1,250 Units/hr (12/07/21 0640)   PRN Meds: sodium chloride, albuterol, morphine injection, naLOXone (NARCAN)  injection, nitroGLYCERIN, perflutren lipid microspheres (DEFINITY) IV suspension, sodium chloride flush, trimethobenzamide   Vital Signs    Vitals:   12/06/21 1938 12/07/21 0042 12/07/21 0404 12/07/21 0732  BP: 92/76 (!) 119/57 (!) 145/65 (!) 156/82  Pulse: 67  (!) 53 (!) 59  Resp: 18  17 18   Temp: 98.7 F (37.1 C) 98.7 F (37.1 C) 98.1 F (36.7 C) 98 F (36.7 C)  TempSrc: Oral Oral Oral Oral  SpO2: 98% 93% 96%   Weight:   90 kg   Height:        Intake/Output Summary (Last 24 hours) at 12/07/2021 1018 Last data filed at 12/07/2021 0406 Gross per 24 hour  Intake 2610.47 ml  Output 900 ml  Net 1710.47 ml      12/07/2021    4:04 AM 12/06/2021    8:31 AM 06/08/2020   11:04 AM  Last 3 Weights  Weight (lbs) 198 lb 8 oz 195 lb 202 lb 3.2 oz  Weight (kg) 90.039 kg 88.451 kg 91.717 kg      Telemetry    SR - Personally Reviewed  ECG    SR no acute changes - Personally Reviewed  Physical Exam   GEN: No acute distress.   Neck: No JVD Cardiac: RRR, no murmurs, rubs, or gallops.  Respiratory: Clear  to auscultation bilaterally. GI: Soft, nontender, non-distended  MS: No edema; No deformity. Neuro:  Nonfocal  Psych: Normal affect   Labs    High Sensitivity Troponin:   Recent Labs  Lab 12/06/21 0849 12/06/21 1053 12/07/21 0624  TROPONINIHS 1,124* 2,511* 3,305*     Chemistry Recent Labs  Lab 12/06/21 0834 12/07/21 0510  NA 135 140  K 3.6 3.6  CL 101 110  CO2 22 22  GLUCOSE 220* 172*  BUN 19 12  CREATININE 1.15* 1.01*  CALCIUM 9.9 8.7*  MG 1.5* 1.9  PROT 7.8  --   ALBUMIN 4.5  --   AST 36  --   ALT 30  --   ALKPHOS 38  --   BILITOT 0.9  --   GFRNONAA 52* >60  ANIONGAP 12 8    Lipids  Recent Labs  Lab 12/07/21 0510  CHOL 125  TRIG 138  HDL 38*  LDLCALC 59  CHOLHDL 3.3    Hematology Recent Labs  Lab 12/06/21 0834 12/07/21 0510  WBC 12.7* 10.7*  RBC 4.75 4.24  HGB 14.5 13.0  HCT 42.5 38.9  MCV  89.5 91.7  MCH 30.5 30.7  MCHC 34.1 33.4  RDW 12.5 13.1  PLT 336 285   Thyroid No results for input(s): "TSH", "FREET4" in the last 168 hours.  BNPNo results for input(s): "BNP", "PROBNP" in the last 168 hours.  DDimer No results for input(s): "DDIMER" in the last 168 hours.   Radiology    CT Renal Stone Study  Result Date: 12/06/2021 CLINICAL DATA:  Flank pain.  Kidney stones suspected. EXAM: CT ABDOMEN AND PELVIS WITHOUT CONTRAST TECHNIQUE: Multidetector CT imaging of the abdomen and pelvis was performed following the standard protocol without IV contrast. RADIATION DOSE REDUCTION: This exam was performed according to the departmental dose-optimization program which includes automated exposure control, adjustment of the mA and/or kV according to patient size and/or use of iterative reconstruction technique. COMPARISON:  None Available. FINDINGS: Lower chest: Unremarkable. Hepatobiliary: The liver shows diffusely decreased attenuation suggesting fat deposition. No suspicious focal abnormality in the liver on this study without intravenous contrast. There is  no evidence for gallstones, gallbladder wall thickening, or pericholecystic fluid. No intrahepatic or extrahepatic biliary dilation. Pancreas: No focal mass lesion. No dilatation of the main duct. No intraparenchymal cyst. No peripancreatic edema. Spleen: No splenomegaly. No focal mass lesion. Adrenals/Urinary Tract: No adrenal nodule or mass. Fiber 6 nonobstructing stones are seen in the right kidney measuring up to 5 mm. No right ureteral stones. 7 rate stones are seen in the left kidney measuring up to about 3 mm. The patient also has a 7 x 6 x 8 mm stone in the left renal pelvis near the UPJ. Mild fullness noted left intrarenal collecting system without overt hydronephrosis. No left ureteral stone. No bladder stones. Stomach/Bowel: Tiny hiatal hernia. Stomach moderately distended with fluid. Duodenum is normally positioned as is the ligament of Treitz. No small bowel wall thickening. No small bowel dilatation. The terminal ileum is normal. Nonvisualization of the appendix is consistent with the reported history of appendectomy. A few scattered diverticuli are seen in the left colon without diverticulitis. Vascular/Lymphatic: No abdominal aortic aneurysm. No abdominal aortic atherosclerotic calcification. There is no gastrohepatic or hepatoduodenal ligament lymphadenopathy. No retroperitoneal or mesenteric lymphadenopathy. No pelvic sidewall lymphadenopathy. Reproductive: Unremarkable. Other: No intraperitoneal free fluid. Musculoskeletal: No worrisome lytic or sclerotic osseous abnormality. IMPRESSION: 1. 7 x 6 x 8 mm stone in the left renal pelvis near the UPJ with mild fullness of the left intrarenal collecting system but no overt hydronephrosis. 2. Bilateral nonobstructing renal stones. 3. Tiny hiatal hernia. 4. Hepatic steatosis. Electronically Signed   By: Kennith Center M.D.   On: 12/06/2021 10:07   DG Chest 2 View  Result Date: 12/06/2021 CLINICAL DATA:  Chest and abdominal pain in a 66 year old  female. EXAM: CHEST - 2 VIEW COMPARISON:  October 15, 2011. FINDINGS: EKG leads project over the chest. Cardiomediastinal contours and hilar structures are normal. Lungs are clear. No sign of pleural effusion. On limited assessment no acute skeletal findings. Mild curvature of the spine is similar with dextroconvex component in the thoracic spine. IMPRESSION: No acute cardiopulmonary disease. Electronically Signed   By: Donzetta Kohut M.D.   On: 12/06/2021 09:05    Cardiac Studies   Echo pending Cardiac cath pending.  Patient Profile     66 y.o. female  hx of anemia, asthma, DM, HLD, HTN, OSA not on CPAP, who was admitted yesterday with flank pain, N/V and found to have elevated troponin.  No past cardiac hx.   Assessment & Plan  NSTEMI with pk  troponin 3,305  --presented with flank pain but night prior she did have mid-epigastric pain with radiation into chest.   -EKG without acute ST changes -for cardiac cath today  -risks reviewed by Dr. Servando Salina yesterday and no questions today.    HLD  -on lipitor 40 and LDL is 59  HDL 38   HTN  -losartan held for cath  -BP mildly elevated today 162/72  Per IM Renal stone, plan to follow as outpt  NIDDM2, on Farxiga Hypomg+ was 1.5 now 1.9       For questions or updates, please contact [facility] Please consult [email or web address] for contact info under        Signed, Nada Boozer, NP  12/07/2021, 10:18 AM    ATTENDING ATTESTATION:  After conducting a review of all available clinical information with the care team, interviewing the patient, and performing a physical exam, I agree with the findings and plan described in this note.   GEN: No acute distress.   HEENT:  MMM, no JVD, no scleral icterus Cardiac: RRR, no murmurs, rubs, or gallops.  Respiratory: Clear to auscultation bilaterally. GI: Soft, nontender, non-distended  MS: No edema; No deformity. Neuro:  Nonfocal  Vasc:  +2 radial pulses  Patient remains angina free  overnight.  Had an episode of renal colic this AM (left flank).  Cont ASA, statin, holding BB given relatively low HR.  Cor angio today and further recommendations will be issues.  TTE pending.  I did review the risks and benefits of coronary angiography and possible PCI with patient and husband.  Patient understands and agrees with proceeding.  I have reviewed the risks, indications, and alternatives to cardiac catheterization, possible angioplasty, and stenting with the patient. Risks include but are not limited to bleeding, infection, vascular injury, stroke, myocardial infection, arrhythmia, kidney injury, radiation-related injury in the case of prolonged fluoroscopy use, emergency cardiac surgery, and death. The patient understands the risks of serious complication is 1-2 in 1000 with diagnostic cardiac cath and 1-2% or less with angioplasty/stenting.    Alverda Skeans, MD Pager [phone]

## 2021-12-07 NOTE — Progress Notes (Signed)
ANTICOAGULATION CONSULT NOTE   Pharmacy Consult for heparin Indication: chest pain/ACS  Allergies  Allergen Reactions   Amoxicillin Hives    Did it involve swelling of the face/tongue/throat, SOB, or low BP? No Did it involve sudden or severe rash/hives, skin peeling, or any reaction on the inside of your mouth or nose? No Did you need to seek medical attention at a hospital or doctor's office? No When did it last happen?      15-20 years ago If all above answers are "NO", may proceed with cephalosporin use.    Bee Venom    Biaxin [Clarithromycin]    Erythromycin Nausea Only and Other (See Comments)    Stomach pain   Ibuprofen Swelling   Other     Bee Stings   Percocet [Oxycodone-Acetaminophen] Itching   Sulfa Antibiotics Hives   Latex Rash   Tape Rash    & bandaids - blisters    Patient Measurements: Height: 5\' 2"  (157.5 cm) Weight: 90 kg (198 lb 8 oz) IBW/kg (Calculated) : 50.1 Heparin Dosing Weight: 70.4kg  Vital Signs: Temp: 98.1 F (36.7 C) (09/01 0404) Temp Source: Oral (09/01 0404) BP: 145/65 (09/01 0404) Pulse Rate: 53 (09/01 0404)  Labs: Recent Labs    12/06/21 0834 12/06/21 0849 12/06/21 1053 12/06/21 1801 12/07/21 0510  HGB 14.5  --   --   --  13.0  HCT 42.5  --   --   --  38.9  PLT 336  --   --   --  285  HEPARINUNFRC  --   --   --  <0.10* 0.25*  CREATININE 1.15*  --   --   --   --   TROPONINIHS  --  1,124* 2,511*  --   --      Estimated Creatinine Clearance: 49.5 mL/min (A) (by C-G formula based on SCr of 1.15 mg/dL (H)).   Medical History: Past Medical History:  Diagnosis Date   Anemia    anemic in 2010   Anxiety    Arthritis    thumb and fingers, knee, right hip   Asthma    takes montelukast-when sick   Cataract    forming    Complication of anesthesia    difficulty waking up   Depression    Diabetes mellitus without complication (HCC)    type 2, diagnosed 2008   GERD (gastroesophageal reflux disease)    omeprazole and  ranitidine   Heart murmur    since birth very slight   High cholesterol    History of hiatal hernia    small   History of kidney stones    december 3- January 7th   Hypertension    Sleep apnea    sleep study in [employer], has but does not use CPAP, Dr. Baldwin park has results [facility]    Medications:  Infusions:   sodium chloride 75 mL/hr at 12/06/21 1807   sodium chloride     sodium chloride 1 mL/kg/hr (12/06/21 2057)   heparin 1,100 Units/hr (12/06/21 2138)    Assessment: 66 yof presented to the ED with nausea and back pain. Troponin elevated and now starting IV heparin. Baseline CBC is WNL. She is not on anticoagulation PTA. Planning for cardiac cath tomorrow 9/1.   Heparin level below goal 0.25, no issues with infusion or overt s/sx of bleeding per RN. CBC stable WNL  Goal of Therapy:  Heparin level 0.3-0.7 units/ml Monitor platelets by anticoagulation protocol: Yes   Plan:  Increase  heparin gtt to 1250 units/hr Cath scheduled for 12, will defer follow up heparin level until after procedure if gtt resumed Daily heparin level and CBC   Ruben Im, PharmD Clinical Pharmacist 12/07/2021 6:24 AM Please check AMION for all [facility] Pharm[facility] numbers

## 2021-12-07 NOTE — Progress Notes (Signed)
  PT HAS ORDER PLACED FOR OUTPT CARDIAC mri  and office will call once date and time arranged and follow up appt has been made.

## 2021-12-07 NOTE — Discharge Instructions (Signed)
Call [facility] at [phone] if any bleeding, swelling or drainage at cath site.  May shower, no tub baths for 48 hours for groin sticks. No lifting over 5 pounds for 3 days.  No Driving for 3 days   Dr. Mallory Shirk office will arrange outpt cardiac MRI.  They will notify you of date and time and give instructions.  Your coronary arteries were clean on cath so that is reassuring.  MRI will help to evaluate other reasons for non ST elevation myocardial infarction.    Heart Healthy Diet

## 2021-12-07 NOTE — Progress Notes (Addendum)
Rounding Note    Patient Name: Jade Perez Date of Encounter: 12/07/2021  [facility] Cardiologist: Thomasene Ripple, DO   Subjective   No chest pain, she did have kidney stone pain last pm  Inpatient Medications    Scheduled Meds:  atorvastatin  40 mg Oral Daily   B-complex with vitamin C  1 tablet Oral Daily   citalopram  40 mg Oral Daily   dapagliflozin propanediol  10 mg Oral Daily   fenofibrate  160 mg Oral Daily   insulin aspart  0-15 Units Subcutaneous TID WC   loratadine  10 mg Oral Daily   montelukast  10 mg Oral QHS   pantoprazole  20 mg Oral Daily   sodium chloride flush  3 mL Intravenous Q12H   Continuous Infusions:  sodium chloride 75 mL/hr at 12/06/21 1807   sodium chloride     sodium chloride Stopped (12/07/21 0852)   heparin 1,250 Units/hr (12/07/21 0640)   PRN Meds: sodium chloride, albuterol, morphine injection, naLOXone (NARCAN)  injection, nitroGLYCERIN, perflutren lipid microspheres (DEFINITY) IV suspension, sodium chloride flush, trimethobenzamide   Vital Signs    Vitals:   12/06/21 1938 12/07/21 0042 12/07/21 0404 12/07/21 0732  BP: 92/76 (!) 119/57 (!) 145/65 (!) 156/82  Pulse: 67  (!) 53 (!) 59  Resp: 18  17 18   Temp: 98.7 F (37.1 C) 98.7 F (37.1 C) 98.1 F (36.7 C) 98 F (36.7 C)  TempSrc: Oral Oral Oral Oral  SpO2: 98% 93% 96%   Weight:   90 kg   Height:        Intake/Output Summary (Last 24 hours) at 12/07/2021 1018 Last data filed at 12/07/2021 0406 Gross per 24 hour  Intake 2610.47 ml  Output 900 ml  Net 1710.47 ml      12/07/2021    4:04 AM 12/06/2021    8:31 AM 06/08/2020   11:04 AM  Last 3 Weights  Weight (lbs) 198 lb 8 oz 195 lb 202 lb 3.2 oz  Weight (kg) 90.039 kg 88.451 kg 91.717 kg      Telemetry    SR - Personally Reviewed  ECG    SR no acute changes - Personally Reviewed  Physical Exam   GEN: No acute distress.   Neck: No JVD Cardiac: RRR, no murmurs, rubs, or gallops.  Respiratory: Clear  to auscultation bilaterally. GI: Soft, nontender, non-distended  MS: No edema; No deformity. Neuro:  Nonfocal  Psych: Normal affect   Labs    High Sensitivity Troponin:   Recent Labs  Lab 12/06/21 0849 12/06/21 1053 12/07/21 0624  TROPONINIHS 1,124* 2,511* 3,305*     Chemistry Recent Labs  Lab 12/06/21 0834 12/07/21 0510  NA 135 140  K 3.6 3.6  CL 101 110  CO2 22 22  GLUCOSE 220* 172*  BUN 19 12  CREATININE 1.15* 1.01*  CALCIUM 9.9 8.7*  MG 1.5* 1.9  PROT 7.8  --   ALBUMIN 4.5  --   AST 36  --   ALT 30  --   ALKPHOS 38  --   BILITOT 0.9  --   GFRNONAA 52* >60  ANIONGAP 12 8    Lipids  Recent Labs  Lab 12/07/21 0510  CHOL 125  TRIG 138  HDL 38*  LDLCALC 59  CHOLHDL 3.3    Hematology Recent Labs  Lab 12/06/21 0834 12/07/21 0510  WBC 12.7* 10.7*  RBC 4.75 4.24  HGB 14.5 13.0  HCT 42.5 38.9  MCV  89.5 91.7  MCH 30.5 30.7  MCHC 34.1 33.4  RDW 12.5 13.1  PLT 336 285   Thyroid No results for input(s): "TSH", "FREET4" in the last 168 hours.  BNPNo results for input(s): "BNP", "PROBNP" in the last 168 hours.  DDimer No results for input(s): "DDIMER" in the last 168 hours.   Radiology    CT Renal Stone Study  Result Date: 12/06/2021 CLINICAL DATA:  Flank pain.  Kidney stones suspected. EXAM: CT ABDOMEN AND PELVIS WITHOUT CONTRAST TECHNIQUE: Multidetector CT imaging of the abdomen and pelvis was performed following the standard protocol without IV contrast. RADIATION DOSE REDUCTION: This exam was performed according to the departmental dose-optimization program which includes automated exposure control, adjustment of the mA and/or kV according to patient size and/or use of iterative reconstruction technique. COMPARISON:  None Available. FINDINGS: Lower chest: Unremarkable. Hepatobiliary: The liver shows diffusely decreased attenuation suggesting fat deposition. No suspicious focal abnormality in the liver on this study without intravenous contrast. There is  no evidence for gallstones, gallbladder wall thickening, or pericholecystic fluid. No intrahepatic or extrahepatic biliary dilation. Pancreas: No focal mass lesion. No dilatation of the main duct. No intraparenchymal cyst. No peripancreatic edema. Spleen: No splenomegaly. No focal mass lesion. Adrenals/Urinary Tract: No adrenal nodule or mass. Fiber 6 nonobstructing stones are seen in the right kidney measuring up to 5 mm. No right ureteral stones. 7 rate stones are seen in the left kidney measuring up to about 3 mm. The patient also has a 7 x 6 x 8 mm stone in the left renal pelvis near the UPJ. Mild fullness noted left intrarenal collecting system without overt hydronephrosis. No left ureteral stone. No bladder stones. Stomach/Bowel: Tiny hiatal hernia. Stomach moderately distended with fluid. Duodenum is normally positioned as is the ligament of Treitz. No small bowel wall thickening. No small bowel dilatation. The terminal ileum is normal. Nonvisualization of the appendix is consistent with the reported history of appendectomy. A few scattered diverticuli are seen in the left colon without diverticulitis. Vascular/Lymphatic: No abdominal aortic aneurysm. No abdominal aortic atherosclerotic calcification. There is no gastrohepatic or hepatoduodenal ligament lymphadenopathy. No retroperitoneal or mesenteric lymphadenopathy. No pelvic sidewall lymphadenopathy. Reproductive: Unremarkable. Other: No intraperitoneal free fluid. Musculoskeletal: No worrisome lytic or sclerotic osseous abnormality. IMPRESSION: 1. 7 x 6 x 8 mm stone in the left renal pelvis near the UPJ with mild fullness of the left intrarenal collecting system but no overt hydronephrosis. 2. Bilateral nonobstructing renal stones. 3. Tiny hiatal hernia. 4. Hepatic steatosis. Electronically Signed   By: Eric  Mansell M.D.   On: 12/06/2021 10:07   DG Chest 2 View  Result Date: 12/06/2021 CLINICAL DATA:  Chest and abdominal pain in a 66-year-old  female. EXAM: CHEST - 2 VIEW COMPARISON:  October 15, 2011. FINDINGS: EKG leads project over the chest. Cardiomediastinal contours and hilar structures are normal. Lungs are clear. No sign of pleural effusion. On limited assessment no acute skeletal findings. Mild curvature of the spine is similar with dextroconvex component in the thoracic spine. IMPRESSION: No acute cardiopulmonary disease. Electronically Signed   By: Geoffrey  Wile M.D.   On: 12/06/2021 09:05    Cardiac Studies   Echo pending Cardiac cath pending.  Patient Profile     66 y.o. female  hx of anemia, asthma, DM, HLD, HTN, OSA not on CPAP, who was admitted yesterday with flank pain, N/V and found to have elevated troponin.  No past cardiac hx.   Assessment & Plan      NSTEMI with pk  troponin 3,305  --presented with flank pain but night prior she did have mid-epigastric pain with radiation into chest.   -EKG without acute ST changes -for cardiac cath today  -risks reviewed by Dr. Servando Salina yesterday and no questions today.    HLD  -on lipitor 40 and LDL is 59  HDL 38   HTN  -losartan held for cath  -BP mildly elevated today 162/72  Per IM Renal stone, plan to follow as outpt  NIDDM2, on Farxiga Hypomg+ was 1.5 now 1.9       For questions or updates, please contact [facility] Please consult [email or web address] for contact info under        Signed, Nada Boozer, NP  12/07/2021, 10:18 AM    ATTENDING ATTESTATION:  After conducting a review of all available clinical information with the care team, interviewing the patient, and performing a physical exam, I agree with the findings and plan described in this note.   GEN: No acute distress.   HEENT:  MMM, no JVD, no scleral icterus Cardiac: RRR, no murmurs, rubs, or gallops.  Respiratory: Clear to auscultation bilaterally. GI: Soft, nontender, non-distended  MS: No edema; No deformity. Neuro:  Nonfocal  Vasc:  +2 radial pulses  Patient remains angina free  overnight.  Had an episode of renal colic this AM (left flank).  Cont ASA, statin, holding BB given relatively low HR.  Cor angio today and further recommendations will be issues.  TTE pending.  I did review the risks and benefits of coronary angiography and possible PCI with patient and husband.  Patient understands and agrees with proceeding.  I have reviewed the risks, indications, and alternatives to cardiac catheterization, possible angioplasty, and stenting with the patient. Risks include but are not limited to bleeding, infection, vascular injury, stroke, myocardial infection, arrhythmia, kidney injury, radiation-related injury in the case of prolonged fluoroscopy use, emergency cardiac surgery, and death. The patient understands the risks of serious complication is 1-2 in 1000 with diagnostic cardiac cath and 1-2% or less with angioplasty/stenting.    Alverda Skeans, MD Pager [phone]

## 2021-12-07 NOTE — Interval H&P Note (Signed)
Cath Lab Visit (complete for each Cath Lab visit)  Clinical Evaluation Leading to the Procedure:   ACS: Yes.    Non-ACS:    Anginal Classification: CCS III  Anti-ischemic medical therapy: Minimal Therapy (1 class of medications)  Non-Invasive Test Results: No non-invasive testing performed  Prior CABG: No previous CABG      History and Physical Interval Note:  12/07/2021 12:30 PM  Jade Perez  has presented today for surgery, with the diagnosis of nstemi.  The various methods of treatment have been discussed with the patient and family. After consideration of risks, benefits and other options for treatment, the patient has consented to  Procedure(s): LEFT HEART CATH AND CORONARY ANGIOGRAPHY (N/A) as a surgical intervention.  The patient's history has been reviewed, patient examined, no change in status, stable for surgery.  I have reviewed the patient's chart and labs.  Questions were answered to the patient's satisfaction.     Lyn Records III

## 2021-12-07 NOTE — CV Procedure (Signed)
Normal coronary arteries with right dominant anatomy. Mid anterior wall hypokinesis.  LVEDP 18 mmHg.  Clinical diagnosis based upon Cath Lab findings is stress cardiomyopathy.  Rule out myopericarditis.

## 2021-12-08 DIAGNOSIS — E669 Obesity, unspecified: Secondary | ICD-10-CM

## 2021-12-08 DIAGNOSIS — N200 Calculus of kidney: Secondary | ICD-10-CM

## 2021-12-08 DIAGNOSIS — I214 Non-ST elevation (NSTEMI) myocardial infarction: Secondary | ICD-10-CM | POA: Diagnosis not present

## 2021-12-08 DIAGNOSIS — E119 Type 2 diabetes mellitus without complications: Secondary | ICD-10-CM

## 2021-12-08 DIAGNOSIS — E1165 Type 2 diabetes mellitus with hyperglycemia: Secondary | ICD-10-CM

## 2021-12-08 LAB — CBC WITH DIFFERENTIAL/PLATELET
Abs Immature Granulocytes: 0.01 10*3/uL (ref 0.00–0.07)
Basophils Absolute: 0 10*3/uL (ref 0.0–0.1)
Basophils Relative: 1 %
Eosinophils Absolute: 0.2 10*3/uL (ref 0.0–0.5)
Eosinophils Relative: 3 %
HCT: 36.5 % (ref 36.0–46.0)
Hemoglobin: 12.3 g/dL (ref 12.0–15.0)
Immature Granulocytes: 0 %
Lymphocytes Relative: 33 %
Lymphs Abs: 2.2 10*3/uL (ref 0.7–4.0)
MCH: 31 pg (ref 26.0–34.0)
MCHC: 33.7 g/dL (ref 30.0–36.0)
MCV: 91.9 fL (ref 80.0–100.0)
Monocytes Absolute: 0.8 10*3/uL (ref 0.1–1.0)
Monocytes Relative: 12 %
Neutro Abs: 3.4 10*3/uL (ref 1.7–7.7)
Neutrophils Relative %: 51 %
Platelets: 247 10*3/uL (ref 150–400)
RBC: 3.97 MIL/uL (ref 3.87–5.11)
RDW: 13 % (ref 11.5–15.5)
WBC: 6.6 10*3/uL (ref 4.0–10.5)
nRBC: 0 % (ref 0.0–0.2)

## 2021-12-08 LAB — BASIC METABOLIC PANEL
Anion gap: 8 (ref 5–15)
BUN: 14 mg/dL (ref 8–23)
CO2: 25 mmol/L (ref 22–32)
Calcium: 8.9 mg/dL (ref 8.9–10.3)
Chloride: 108 mmol/L (ref 98–111)
Creatinine, Ser: 1.68 mg/dL — ABNORMAL HIGH (ref 0.44–1.00)
GFR, Estimated: 33 mL/min — ABNORMAL LOW (ref >60)
Glucose, Bld: 132 mg/dL — ABNORMAL HIGH (ref 70–99)
Potassium: 4 mmol/L (ref 3.5–5.1)
Sodium: 141 mmol/L (ref 135–145)

## 2021-12-08 LAB — URINE CULTURE: Culture: 30000 — AB

## 2021-12-08 LAB — GLUCOSE, CAPILLARY
Glucose-Capillary: 142 mg/dL — ABNORMAL HIGH (ref 70–99)
Glucose-Capillary: 116 mg/dL — ABNORMAL HIGH (ref 70–99)
Glucose-Capillary: 147 mg/dL — ABNORMAL HIGH (ref 70–99)

## 2021-12-08 LAB — TROPONIN I (HIGH SENSITIVITY): Troponin I (High Sensitivity): 1071 ng/L (ref <18)

## 2021-12-08 LAB — C-REACTIVE PROTEIN: CRP: 0.8 mg/dL (ref <1.0)

## 2021-12-08 LAB — MAGNESIUM: Magnesium: 1.8 mg/dL (ref 1.7–2.4)

## 2021-12-08 LAB — SEDIMENTATION RATE: Sed Rate: 3 mm/hr (ref 0–22)

## 2021-12-08 MED ORDER — CEFADROXIL 500 MG PO CAPS
500.0000 mg | ORAL_CAPSULE | Freq: Two times a day (BID) | ORAL | Status: DC
  Administered 2021-12-08 – 2021-12-10 (×5): 500 mg via ORAL
  Filled 2021-12-08 (×9): qty 1

## 2021-12-08 MED ORDER — LIDOCAINE 5 % EX PTCH: 1.0000 | MEDICATED_PATCH | CUTANEOUS | Status: DC

## 2021-12-08 MED ORDER — ACETAMINOPHEN 325 MG PO TABS
650.0000 mg | ORAL_TABLET | Freq: Four times a day (QID) | ORAL | Status: DC | PRN
  Administered 2021-12-08 – 2021-12-13 (×6): 650 mg via ORAL
  Filled 2021-12-08 (×6): qty 2

## 2021-12-08 NOTE — Plan of Care (Signed)
?  Problem: Education: ?Goal: Knowledge of General Education information will improve ?Description: Including pain rating scale, medication(s)/side effects and non-pharmacologic comfort measures ?Outcome: Progressing ?  ?Problem: Clinical Measurements: ?Goal: Respiratory complications will improve ?Outcome: Progressing ?  ?Problem: Activity: ?Goal: Risk for activity intolerance will decrease ?Outcome: Progressing ?  ?Problem: Nutrition: ?Goal: Adequate nutrition will be maintained ?Outcome: Progressing ?  ?Problem: Coping: ?Goal: Level of anxiety will decrease ?Outcome: Progressing ?  ?Problem: Pain Managment: ?Goal: General experience of comfort will improve ?Outcome: Progressing ?  ?

## 2021-12-08 NOTE — Progress Notes (Signed)
Jade Perez OZH:[id number] DOB: April 06, 1956 DOA: 12/06/2021 PCP: Abner Greenspan, MD   Subj: 66 year old WF PMHx HLD, HTN, DM type II controlled with complication, DM type II nephropathy,   Presented to [facility] due to flank pain, n/v, CT renal stone obtained showed left renal pelvis stone , no hydro , she is found to have elevated troponin, she is started on heparin drip, admitted to [facility] for cardiology eval.   Obj: 9/2A/O x4, negative nausea since this morning, negative vomiting, negative abdominal pain, negative dysuria, positive left CVA tenderness>>>> right   Objective: VITAL SIGNS: Temp: 98.6 F (37 C) (09/02 0817) Temp Source: Oral (09/02 0817) BP: 162/67 (09/02 0817) Pulse Rate: 62 (09/02 0817) SPO2; FIO2:   Intake/Output Summary (Last 24 hours) at 12/08/2021 0855 Last data filed at 12/08/2021 0841 Gross per 24 hour  Intake 2198.36 ml  Output --  Net 2198.36 ml     Exam: General: A/O x4 No acute respiratory distress Lungs: Clear to auscultation bilaterally without wheezes or crackles Cardiovascular: Regular rate and rhythm without murmur gallop or rub normal S1 and S2 Abdomen: Nontender, nondistended, soft, bowel sounds positive, no rebound, no ascites, no appreciable mass, positive LEFT CVA tenderness>>> RIGHT Extremities: No significant cyanosis, clubbing, or edema bilateral lower extremities Skin: Negative rashes, lesions, ulcers Psychiatric:  Negative depression, negative anxiety, negative fatigue, negative mania  Central nervous system:  Cranial nerves II through XII intact, tongue/uvula midline, all extremities muscle strength 5/5, sensation intact throughout, negative dysarthria, negative expressive aphasia, negative receptive aphasia.      Mobility Assessment (last 72 hours)     Mobility Assessment     Row Name 12/07/21 2000 12/06/21 2000 12/06/21 1545 12/06/21 1544     Does patient have an order for bedrest or is patient medically unstable No -  Continue assessment No - Continue assessment No - Continue assessment No - Continue assessment    What is the highest level of mobility based on the progressive mobility assessment? Level 5 (Walks with assist in room/hall) - Balance while stepping forward/back and can walk in room with assist - Complete Level 6 (Walks independently in room and hall) - Balance while walking in room without assist - Complete Level 6 (Walks independently in room and hall) - Balance while walking in room without assist - Complete Level 6 (Walks independently in room and hall) - Balance while walking in room without assist - Complete               DVT prophylaxis: Subcu heparin Code Status: Full Family Communication: 9/2 husband at bedside for discussion of plan of care all questions answered Status is: Inpatient    Dispo: The patient is from: Home              Anticipated d/c is to: Home              Anticipated d/c date is: 1 day              Patient currently is not medically stable to d/c.    Procedures/Significant Events: Cardiac catheterization   Consultants:  Cardiology   Cultures 8/31 urine positive GROUP B STREP(S.AGALACTIAE    Antimicrobials: Anti-infectives (From admission, onward)    Start     Dose/Rate Route Frequency Ordered Stop   12/07/21 1545  cefTRIAXone (ROCEPHIN) 1 g in sodium chloride 0.9 % 100 mL IVPB       Note to Pharmacy: Appear to have tolerated ancef in the past, please let  me know if rocephin is ok? thx   1 g 200 mL/hr over 30 Minutes Intravenous Every 24 hours 12/07/21 1456          A/P  NSTEMI -S/p cardiac cath: Normal coronary arteries with right dominant anatomy. Mid anterior wall hypokinesis.  LVEDP 18 mmHg. -No work, driving or lifting for 3 days post cath per cards. -Outpatient cardiology follow-up for cardiac MRI scheduled, by Dr.Gayatri A Jacques [employer] -Currently chest pain free      Renal stone -Currently no pain, no n/v,  -Follow-up next week  with urology.  -See UTI   positive GROUP B STREP UTI - 9/2 DC ceftriaxone---->Cefadroxil.  Complete 5-day course antibiotics.   Mild leukocytosis -Does not appear septic, likely due to stress -Blood culture no growth, urine culture in process -Continue hydration for another day, started empiric antibiotic to cover urinary source    DM type II uncontrolled with hyperglycemia diabetes -Am hyperglycemia likely due to stress -a1c 6.9, -Continue  ssi diabetes -Continue farxiga  CBG (last 3)  Recent Labs    12/07/21 2111 12/08/21 0614 12/08/21 1118  GLUCAP 127* 142* 116*      Obesity (BMI 36.31 kg/m) -Address with PCP  Hypomagnesmia - Magnesium goal> 2       Care during the described time interval was provided by me .  I have reviewed this patient's available data, including medical history, events of note, physical examination, and all test results as part of my evaluation.

## 2021-12-08 NOTE — Progress Notes (Addendum)
Rounding Note    Patient Name: Jade Perez Date of Encounter: 12/08/2021  [place] Cardiologist: Berniece Salines, DO   Subjective   Feels better today. No CP. Mild L flank ache and mild nausea.  Inpatient Medications    Scheduled Meds:  atorvastatin  40 mg Oral Daily   B-complex with vitamin C  1 tablet Oral Daily   citalopram  40 mg Oral Daily   dapagliflozin propanediol  10 mg Oral Daily   fenofibrate  160 mg Oral Daily   heparin  5,000 Units Subcutaneous Q8H   insulin aspart  0-15 Units Subcutaneous TID WC   loratadine  10 mg Oral Daily   montelukast  10 mg Oral QHS   pantoprazole  20 mg Oral Daily   sodium chloride flush  3 mL Intravenous Q12H   Continuous Infusions:  sodium chloride     sodium chloride 75 mL/hr at 12/08/21 0644   cefTRIAXone (ROCEPHIN)  IV 1 g (12/07/21 1716)   PRN Meds: sodium chloride, acetaminophen, albuterol, morphine injection, naLOXone (NARCAN)  injection, nitroGLYCERIN, ondansetron (ZOFRAN) IV, sodium chloride flush, trimethobenzamide   Vital Signs    Vitals:   12/07/21 1916 12/07/21 2330 12/08/21 0630 12/08/21 0817  BP: (!) 106/58 118/62 (!) 158/90 (!) 162/67  Pulse: 72 64 60 62  Resp: 20 16 18 20   Temp: 98.8 F (37.1 C) 98.7 F (37.1 C) 98.6 F (37 C) 98.6 F (37 C)  TempSrc: Oral Oral Oral Oral  SpO2: 99% 97%  96%  Weight:   90.4 kg   Height:        Intake/Output Summary (Last 24 hours) at 12/08/2021 1010 Last data filed at 12/08/2021 0841 Gross per 24 hour  Intake 2198.36 ml  Output --  Net 2198.36 ml      12/08/2021    6:30 AM 12/07/2021    4:04 AM 12/06/2021    8:31 AM  Last 3 Weights  Weight (lbs) 199 lb 4.7 oz 198 lb 8 oz 195 lb  Weight (kg) 90.4 kg 90.039 kg 88.451 kg      Telemetry    SR - Personally Reviewed  ECG    SR no acute changes - Personally Reviewed  Physical Exam   GEN: No acute distress.   Neck: No JVD Cardiac: RRR, no murmurs, rubs, or gallops.  Respiratory: Clear to  auscultation bilaterally. GI: Soft, nontender, non-distended  MS: No edema; No deformity. Neuro:  Nonfocal  Psych: Normal affect   Labs    High Sensitivity Troponin:   Recent Labs  Lab 12/06/21 0849 12/06/21 1053 12/07/21 0624  TROPONINIHS 1,124* 2,511* 3,305*     Chemistry Recent Labs  Lab 12/06/21 0834 12/07/21 0510 12/08/21 0314  NA 135 140 141  K 3.6 3.6 4.0  CL 101 110 108  CO2 22 22 25   GLUCOSE 220* 172* 132*  BUN 19 12 14   CREATININE 1.15* 1.01* 1.68*  CALCIUM 9.9 8.7* 8.9  MG 1.5* 1.9 1.8  PROT 7.8  --   --   ALBUMIN 4.5  --   --   AST 36  --   --   ALT 30  --   --   ALKPHOS 38  --   --   BILITOT 0.9  --   --   GFRNONAA 52* >60 33*  ANIONGAP 12 8 8     Lipids  Recent Labs  Lab 12/07/21 0510  CHOL 125  TRIG 138  HDL 38*  LDLCALC 59  CHOLHDL 3.3    Hematology Recent Labs  Lab 12/06/21 0834 12/07/21 0510 12/08/21 0314  WBC 12.7* 10.7* 6.6  RBC 4.75 4.24 3.97  HGB 14.5 13.0 12.3  HCT 42.5 38.9 36.5  MCV 89.5 91.7 91.9  MCH 30.5 30.7 31.0  MCHC 34.1 33.4 33.7  RDW 12.5 13.1 13.0  PLT 336 285 247   Thyroid No results for input(s): "TSH", "FREET4" in the last 168 hours.  BNPNo results for input(s): "BNP", "PROBNP" in the last 168 hours.  DDimer No results for input(s): "DDIMER" in the last 168 hours.   Radiology    ECHOCARDIOGRAM COMPLETE  Result Date: 12/07/2021    ECHOCARDIOGRAM REPORT   Patient Name:   Jade Perez [facility] Date of Exam: 12/07/2021 Medical Rec #:  [id number]         Height:       62.0 in Accession #:    [id number]        Weight:       198.5 lb Date of Birth:  1956/03/01         BSA:          1.906 m Patient Age:    66 years          BP:           145/65 mmHg Patient Gender: F                 HR:           67 bpm. Exam Location:  Inpatient Procedure: 2D Echo and Intracardiac Opacification Agent Indications:    NSTEMI  History:        Patient has no prior history of Echocardiogram examinations.                 NSTEMI,  Signs/Symptoms:Chest Pain; Risk Factors:Diabetes and                 Asthma.  Sonographer:    Harvie Junior Referring Phys: [id number] FANG XU  Sonographer Comments: Technically difficult study due to poor echo windows. IMPRESSIONS  1. Left ventricular ejection fraction, by estimation, is 50 to 55%. The left ventricle has low normal function. The left ventricle demonstrates regional wall motion abnormalities (see scoring diagram/findings for description). Left ventricular diastolic  parameters are consistent with Grade I diastolic dysfunction (impaired relaxation).  2. Right ventricular systolic function is normal. The right ventricular size is normal. Tricuspid regurgitation signal is inadequate for assessing PA pressure.  3. The mitral valve is normal in structure. Trivial mitral valve regurgitation. No evidence of mitral stenosis.  4. The aortic valve is tricuspid. Aortic valve regurgitation is not visualized. No aortic stenosis is present.  5. The inferior vena cava is normal in size with greater than 50% respiratory variability, suggesting right atrial pressure of 3 mmHg. FINDINGS  Left Ventricle: Left ventricular ejection fraction, by estimation, is 50 to 55%. The left ventricle has low normal function. The left ventricle demonstrates regional wall motion abnormalities. The left ventricular internal cavity size was normal in size. There is no left ventricular hypertrophy. Left ventricular diastolic parameters are consistent with Grade I diastolic dysfunction (impaired relaxation).  LV Wall Scoring: The antero-lateral wall, inferior wall, and posterior wall are hypokinetic. Right Ventricle: The right ventricular size is normal. No increase in right ventricular wall thickness. Right ventricular systolic function is normal. Tricuspid regurgitation signal is inadequate for assessing PA pressure. The tricuspid regurgitant velocity is 1.37 m/s, and with an assumed right atrial pressure  of 3 mmHg, the estimated right  ventricular systolic pressure is 99.2 mmHg. Left Atrium: Left atrial size was normal in size. Right Atrium: Right atrial size was normal in size. Pericardium: There is no evidence of pericardial effusion. Mitral Valve: The mitral valve is normal in structure. Trivial mitral valve regurgitation. No evidence of mitral valve stenosis. Tricuspid Valve: The tricuspid valve is normal in structure. Tricuspid valve regurgitation is not demonstrated. No evidence of tricuspid stenosis. Aortic Valve: The aortic valve is tricuspid. Aortic valve regurgitation is not visualized. Aortic regurgitation PHT measures 718 msec. No aortic stenosis is present. Aortic valve mean gradient measures 3.0 mmHg. Aortic valve peak gradient measures 6.7 mmHg. Aortic valve area, by VTI measures 1.39 cm. Pulmonic Valve: The pulmonic valve was not well visualized. Pulmonic valve regurgitation is not visualized. No evidence of pulmonic stenosis. Aorta: The aortic root is normal in size and structure. Venous: The inferior vena cava is normal in size with greater than 50% respiratory variability, suggesting right atrial pressure of 3 mmHg. IAS/Shunts: The interatrial septum was not well visualized.  LEFT VENTRICLE PLAX 2D LVIDd:         4.90 cm      Diastology LVIDs:         3.60 cm      LV e' medial:    5.87 cm/s LV PW:         1.00 cm      LV E/e' medial:  11.0 LV IVS:        0.80 cm      LV e' lateral:   8.81 cm/s LVOT diam:     1.80 cm      LV E/e' lateral: 7.3 LV SV:         36 LV SV Index:   19 LVOT Area:     2.54 cm  LV Volumes (MOD) LV vol d, MOD A2C: 104.8 ml LV vol d, MOD A4C: 118.0 ml LV vol s, MOD A2C: 48.6 ml LV vol s, MOD A4C: 58.4 ml LV SV MOD A2C:     56.2 ml LV SV MOD A4C:     118.0 ml LV SV MOD BP:      58.2 ml RIGHT VENTRICLE RV S prime:     15.10 cm/s TAPSE (M-mode): 2.0 cm LEFT ATRIUM             Index        RIGHT ATRIUM          Index LA diam:        3.00 cm 1.57 cm/m   RA Area:     7.49 cm LA Vol (A2C):   44.2 ml 23.20 ml/m   RA Volume:   12.70 ml 6.66 ml/m LA Vol (A4C):   32.5 ml 17.06 ml/m LA Biplane Vol: 39.4 ml 20.68 ml/m  AORTIC VALVE                    PULMONIC VALVE AV Area (Vmax):    1.50 cm     PV Vmax:       1.18 m/s AV Area (Vmean):   1.41 cm     PV Peak grad:  5.6 mmHg AV Area (VTI):     1.39 cm AV Vmax:           129.00 cm/s AV Vmean:          84.200 cm/s AV VTI:            0.260  m AV Peak Grad:      6.7 mmHg AV Mean Grad:      3.0 mmHg LVOT Vmax:         75.80 cm/s LVOT Vmean:        46.700 cm/s LVOT VTI:          0.142 m LVOT/AV VTI ratio: 0.55 AI PHT:            718 msec  AORTA Ao Root diam: 2.90 cm Ao Asc diam:  3.00 cm MITRAL VALVE               TRICUSPID VALVE MV Area (PHT): 3.53 cm    TR Peak grad:   7.5 mmHg MV Decel Time: 215 msec    TR Vmax:        137.00 cm/s MR Peak grad: 13.9 mmHg MR Vmax:      186.50 cm/s  SHUNTS MV E velocity: 64.30 cm/s  Systemic VTI:  0.14 m MV A velocity: 70.40 cm/s  Systemic Diam: 1.80 cm MV E/A ratio:  0.91 Cherlynn Kaiser MD Electronically signed by Cherlynn Kaiser MD Signature Date/Time: 12/07/2021/3:13:00 PM    Final    CARDIAC CATHETERIZATION  Result Date: 12/07/2021 CONCLUSIONS: Right dominant coronary anatomy.  Widely patent coronaries without evidence of obstructive disease. Suggestion of mild mid anterior wall hypokinesis with LVEDP 18 mmHg. 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  Rule out myocarditis.  Rule out Mu[place].  Cardiac MRI can be helpful and further identifying etiology.    Cardiac Studies   Echo pending Cardiac cath pending.  Patient Profile     66 y.o. female  hx of anemia, asthma, DM, HLD, HTN, OSA not on CPAP, who was admitted yesterday with flank pain, N/V and found to have elevated troponin.  No past cardiac hx.   Assessment & Plan   Principal Problem:   NSTEMI (non-ST elevated myocardial infarction) ([place]) Active Problems:   Type 2 diabetes mellitus without complication, without long-term  current use of insulin (H[facility])   Hypomagnesemia   Bilateral renal stones   Obesity (BMI 30-39.9)   Controlled type 2 diabetes mellitus with hyperglycemia ([place])   NSTEMI with pk  troponin 3,305 - will trend further --presented with flank pain but night prior she did have mid-epigastric pain with radiation into chest.   -EKG without acute ST changes -cath with normal cors. She does have wall motion abnormalities on echo and cath.  -query [place] with hx of diabetes vs takotsubo vs myopericarditis. - will obtain ESR CRP. If elevated, will determine timing of MRI. She cannot take NSAIDs. If ESR CRP elevated we can consider colchicine. No effusion on echo.  - d/w patient who agrees with plan. If she feels well and tolerates po, she has outpatient follow up arranged and can consider outpatient MRI.  HLD  -on lipitor 40 and LDL is 59  HDL 38   HTN  -losartan held for cath, Cr elevated today so would hold again today. Encouraged PO intake. -BP mildly elevated today 162/67.   Per IM Renal stone, plan to follow as outpt  NIDDM2, on Farxiga Hypomg+ was 1.5 now 1.9       For questions or updates, please contact [place] Please consult [email or web address] for contact info under        Signed, Elouise Munroe, MD  12/08/2021, 10:10 AM    ADDENDUM: ESR and CRP normal, troponin trending down. Plan for outpatient cardiology follow up and consider outpatient MRI.  Already arranged to be scheduled.  D/w patient who is in agreement with plan. D/w primary service. Cardiology will sign off, primary team anticipates discharge in am.  Elouise Munroe, MD

## 2021-12-09 DIAGNOSIS — I214 Non-ST elevation (NSTEMI) myocardial infarction: Secondary | ICD-10-CM | POA: Diagnosis not present

## 2021-12-09 DIAGNOSIS — E1165 Type 2 diabetes mellitus with hyperglycemia: Secondary | ICD-10-CM | POA: Diagnosis not present

## 2021-12-09 DIAGNOSIS — N200 Calculus of kidney: Secondary | ICD-10-CM | POA: Diagnosis not present

## 2021-12-09 LAB — COMPREHENSIVE METABOLIC PANEL
ALT: 28 U/L (ref 0–44)
AST: 42 U/L — ABNORMAL HIGH (ref 15–41)
Albumin: 3.2 g/dL — ABNORMAL LOW (ref 3.5–5.0)
Alkaline Phosphatase: 30 U/L — ABNORMAL LOW (ref 38–126)
Anion gap: 8 (ref 5–15)
BUN: 16 mg/dL (ref 8–23)
CO2: 22 mmol/L (ref 22–32)
Calcium: 8.7 mg/dL — ABNORMAL LOW (ref 8.9–10.3)
Chloride: 108 mmol/L (ref 98–111)
Creatinine, Ser: 1.87 mg/dL — ABNORMAL HIGH (ref 0.44–1.00)
GFR, Estimated: 29 mL/min — ABNORMAL LOW (ref >60)
Glucose, Bld: 148 mg/dL — ABNORMAL HIGH (ref 70–99)
Potassium: 3.5 mmol/L (ref 3.5–5.1)
Sodium: 138 mmol/L (ref 135–145)
Total Bilirubin: 1.1 mg/dL (ref 0.3–1.2)
Total Protein: 5.6 g/dL — ABNORMAL LOW (ref 6.5–8.1)

## 2021-12-09 LAB — CBC WITH DIFFERENTIAL/PLATELET
Abs Immature Granulocytes: 0.01 10*3/uL (ref 0.00–0.07)
Basophils Absolute: 0 10*3/uL (ref 0.0–0.1)
Basophils Relative: 1 %
Eosinophils Absolute: 0.2 10*3/uL (ref 0.0–0.5)
Eosinophils Relative: 3 %
HCT: 36 % (ref 36.0–46.0)
Hemoglobin: 12 g/dL (ref 12.0–15.0)
Immature Granulocytes: 0 %
Lymphocytes Relative: 27 %
Lymphs Abs: 2.1 10*3/uL (ref 0.7–4.0)
MCH: 30.7 pg (ref 26.0–34.0)
MCHC: 33.3 g/dL (ref 30.0–36.0)
MCV: 92.1 fL (ref 80.0–100.0)
Monocytes Absolute: 1.1 10*3/uL — ABNORMAL HIGH (ref 0.1–1.0)
Monocytes Relative: 14 %
Neutro Abs: 4.2 10*3/uL (ref 1.7–7.7)
Neutrophils Relative %: 55 %
Platelets: 242 10*3/uL (ref 150–400)
RBC: 3.91 MIL/uL (ref 3.87–5.11)
RDW: 12.8 % (ref 11.5–15.5)
WBC: 7.7 10*3/uL (ref 4.0–10.5)
nRBC: 0 % (ref 0.0–0.2)

## 2021-12-09 LAB — LIPOPROTEIN A (LPA): Lipoprotein (a): 45.3 nmol/L — ABNORMAL HIGH (ref <75.0)

## 2021-12-09 LAB — GLUCOSE, CAPILLARY
Glucose-Capillary: 130 mg/dL — ABNORMAL HIGH (ref 70–99)
Glucose-Capillary: 122 mg/dL — ABNORMAL HIGH (ref 70–99)
Glucose-Capillary: 130 mg/dL — ABNORMAL HIGH (ref 70–99)
Glucose-Capillary: 165 mg/dL — ABNORMAL HIGH (ref 70–99)

## 2021-12-09 LAB — PHOSPHORUS: Phosphorus: 3.9 mg/dL (ref 2.5–4.6)

## 2021-12-09 LAB — MAGNESIUM: Magnesium: 1.7 mg/dL (ref 1.7–2.4)

## 2021-12-09 MED ORDER — POTASSIUM CHLORIDE CRYS ER 20 MEQ PO TBCR
40.0000 meq | EXTENDED_RELEASE_TABLET | Freq: Once | ORAL | Status: AC
  Administered 2021-12-09: 40 meq via ORAL
  Filled 2021-12-09: qty 2

## 2021-12-09 MED ORDER — MAGNESIUM OXIDE -MG SUPPLEMENT 400 (240 MG) MG PO TABS
400.0000 mg | ORAL_TABLET | Freq: Once | ORAL | Status: AC
  Administered 2021-12-09: 400 mg via ORAL
  Filled 2021-12-09: qty 1

## 2021-12-09 NOTE — Plan of Care (Signed)
  Problem: Clinical Measurements: Goal: Will remain free from infection Outcome: Completed/Met Goal: Respiratory complications will improve Outcome: Completed/Met   Problem: Activity: Goal: Risk for activity intolerance will decrease Outcome: Completed/Met   Problem: Safety: Goal: Ability to remain free from injury will improve Outcome: Completed/Met   Problem: Skin Integrity: Goal: Risk for impaired skin integrity will decrease Outcome: Completed/Met   Problem: Fluid Volume: Goal: Ability to maintain a balanced intake and output will improve Outcome: Completed/Met   Problem: Nutritional: Goal: Maintenance of adequate nutrition will improve Outcome: Completed/Met

## 2021-12-09 NOTE — Progress Notes (Signed)
Jade Perez:[id number] DOB: Mar 19, 1956 DOA: 12/06/2021 PCP: Abner Greenspan, MD   Subj: 66 year old WF PMHx HLD, HTN, DM type II controlled with complication, DM type II nephropathy,   Presented to [facility] due to flank pain, n/v, CT renal stone obtained showed left renal pelvis stone , no hydro , she is found to have elevated troponin, she is started on heparin drip, admitted to [facility] for cardiology eval.   Obj: 9/2 A/O x4, negative nausea since this morning, negative vomiting, negative abdominal pain, negative dysuria, positive left CVA tenderness>>>> right   Objective: VITAL SIGNS: Temp: 98.4 F (36.9 C) (09/03 4401) Temp Source: Oral (09/03 0628) Pulse Rate: 70 (09/03 0628) SPO2; FIO2:   Intake/Output Summary (Last 24 hours) at 12/09/2021 1149 Last data filed at 12/09/2021 1029 Gross per 24 hour  Intake 1411.35 ml  Output 3250 ml  Net -1838.65 ml      Exam: General: A/O x4 No acute respiratory distress Lungs: Clear to auscultation bilaterally without wheezes or crackles Cardiovascular: Regular rate and rhythm without murmur gallop or rub normal S1 and S2 Abdomen: Nontender, nondistended, soft, bowel sounds positive, no rebound, no ascites, no appreciable mass, positive LEFT CVA tenderness>>> RIGHT Extremities: No significant cyanosis, clubbing, or edema bilateral lower extremities Skin: Negative rashes, lesions, ulcers Psychiatric:  Negative depression, negative anxiety, negative fatigue, negative mania  Central nervous system:  Cranial nerves II through XII intact, tongue/uvula midline, all extremities muscle strength 5/5, sensation intact throughout, negative dysarthria, negative expressive aphasia, negative receptive aphasia.      Mobility Assessment (last 72 hours)     Mobility Assessment     Row Name 12/08/21 1940 12/08/21 0841 12/07/21 2000 12/06/21 2000 12/06/21 1545   Does patient have an order for bedrest or is patient medically unstable -- No -  Continue assessment No - Continue assessment No - Continue assessment No - Continue assessment   What is the highest level of mobility based on the progressive mobility assessment? Level 6 (Walks independently in room and hall) - Balance while walking in room without assist - Complete Level 6 (Walks independently in room and hall) - Balance while walking in room without assist - Complete Level 5 (Walks with assist in room/hall) - Balance while stepping forward/back and can walk in room with assist - Complete Level 6 (Walks independently in room and hall) - Balance while walking in room without assist - Complete Level 6 (Walks independently in room and hall) - Balance while walking in room without assist - Complete    Row Name 12/06/21 1544           Does patient have an order for bedrest or is patient medically unstable No - Continue assessment       What is the highest level of mobility based on the progressive mobility assessment? Level 6 (Walks independently in room and hall) - Balance while walking in room without assist - Complete                  DVT prophylaxis: Subcu heparin Code Status: Full Family Communication: 9/3 husband at bedside for discussion of plan of care all questions answered Status is: Inpatient    Dispo: The patient is from: Home              Anticipated d/c is to: Home              Anticipated d/c date is: 1 day  Patient currently is not medically stable to d/c.    Procedures/Significant Events: 9/1 cardiac catheterization Right dominant coronary anatomy.  Widely patent coronaries without evidence of obstructive disease. Suggestion of mild mid anterior wall hypokinesis with LVEDP 18 mmHg.   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  Rule out myocarditis.  Rule out MINOCA.  Cardiac MRI can be helpful and further identifying etiology.  Consultants:  Cardiology   Cultures 8/31 urine positive  GROUP B STREP(S.AGALACTIAE    Antimicrobials: Anti-infectives (From admission, onward)    Start     Ordered Stop   12/08/21 1430  cefadroxil (DURICEF) capsule 500 mg        12/08/21 1336     12/07/21 1545  cefTRIAXone (ROCEPHIN) 1 g in sodium chloride 0.9 % 100 mL IVPB  Status:  Discontinued       Note to Pharmacy: Appear to have tolerated ancef in the past, please let me know if rocephin is ok? thx   12/07/21 1456 12/08/21 1336        A/P  NSTEMI -S/p cardiac cath: Normal coronary arteries with right dominant anatomy. Mid anterior wall hypokinesis.  LVEDP 18 mmHg. -No work, driving or lifting for 3 days post cath per cards. -Outpatient cardiology follow-up for cardiac MRI scheduled, by Dr.Gayatri A Jacques [facility] -Currently chest pain free  -Strict in and out +2.0 L - Daily weight Filed Weights   12/07/21 0404 12/08/21 0630 12/09/21 0628  Weight: 90 kg 90.4 kg 90.2 kg        Renal stone -Currently no pain, no n/v,  -Follow-up next week with urology.  -See UTI   positive GROUP B STREP UTI - 9/2 DC ceftriaxone---->Cefadroxil.  Complete 5-day course antibiotics.  AKI (baseline Cr 1.1) Lab Results  Component Value Date   CREATININE 1.87 (H) 12/09/2021   CREATININE 1.68 (H) 12/08/2021   CREATININE 1.01 (H) 12/07/2021   CREATININE 1.15 (H) 12/06/2021   CREATININE 0.77 03/22/2019  -9/3 creatinine most likely has not peaked post catheterization.  Continue to hydrate   Mild leukocytosis -Does not appear septic, likely due to stress -Blood culture no growth, urine culture in process -Continue hydration for another day, started empiric antibiotic to cover urinary source    DM type II uncontrolled with hyperglycemia diabetes -Am hyperglycemia likely due to stress -a1c 6.9, -Continue  ssi diabetes -Continue farxiga  CBG (last 3)  Recent Labs    12/08/21 1612 12/09/21 0622 12/09/21 1126  GLUCAP 147* 130* 122*    Obesity (BMI 36.31 kg/m) -Address with  PCP  Hypomagnesmia - Magnesium goal> 2 -9/3 Magnesium oxide 400 mg  Hypokalemia - Potassium goal> 4 - 9/3 K-Dur 40 mEq       Care during the described time interval was provided by me .  I have reviewed this patient's available data, including medical history, events of note, physical examination, and all test results as part of my evaluation.

## 2021-12-09 NOTE — Plan of Care (Signed)
  Problem: Nutrition: Goal: Adequate nutrition will be maintained Outcome: Completed/Met   Problem: Coping: Goal: Level of anxiety will decrease Outcome: Completed/Met   Problem: Elimination: Goal: Will not experience complications related to urinary retention Outcome: Completed/Met   Problem: Pain Managment: Goal: General experience of comfort will improve Outcome: Completed/Met   Problem: Skin Integrity: Goal: Risk for impaired skin integrity will decrease Outcome: Completed/Met   Problem: Tissue Perfusion: Goal: Adequacy of tissue perfusion will improve Outcome: Completed/Met   Problem: Education: Goal: Individualized Educational Video(s) Outcome: Completed/Met   Problem: Activity: Goal: Ability to return to baseline activity level will improve Outcome: Completed/Met

## 2021-12-10 ENCOUNTER — Inpatient Hospital Stay (HOSPITAL_COMMUNITY): Payer: Medicare PPO

## 2021-12-10 DIAGNOSIS — E1165 Type 2 diabetes mellitus with hyperglycemia: Secondary | ICD-10-CM | POA: Diagnosis not present

## 2021-12-10 DIAGNOSIS — N200 Calculus of kidney: Secondary | ICD-10-CM | POA: Diagnosis not present

## 2021-12-10 DIAGNOSIS — I214 Non-ST elevation (NSTEMI) myocardial infarction: Secondary | ICD-10-CM | POA: Diagnosis not present

## 2021-12-10 LAB — GLUCOSE, CAPILLARY
Glucose-Capillary: 118 mg/dL — ABNORMAL HIGH (ref 70–99)
Glucose-Capillary: 156 mg/dL — ABNORMAL HIGH (ref 70–99)
Glucose-Capillary: 131 mg/dL — ABNORMAL HIGH (ref 70–99)
Glucose-Capillary: 145 mg/dL — ABNORMAL HIGH (ref 70–99)

## 2021-12-10 LAB — CBC WITH DIFFERENTIAL/PLATELET
Abs Immature Granulocytes: 0.01 10*3/uL (ref 0.00–0.07)
Basophils Absolute: 0 10*3/uL (ref 0.0–0.1)
Basophils Relative: 1 %
Eosinophils Absolute: 0.3 10*3/uL (ref 0.0–0.5)
Eosinophils Relative: 4 %
HCT: 35.7 % — ABNORMAL LOW (ref 36.0–46.0)
Hemoglobin: 12.2 g/dL (ref 12.0–15.0)
Immature Granulocytes: 0 %
Lymphocytes Relative: 35 %
Lymphs Abs: 2.3 10*3/uL (ref 0.7–4.0)
MCH: 31 pg (ref 26.0–34.0)
MCHC: 34.2 g/dL (ref 30.0–36.0)
MCV: 90.8 fL (ref 80.0–100.0)
Monocytes Absolute: 0.9 10*3/uL (ref 0.1–1.0)
Monocytes Relative: 14 %
Neutro Abs: 3 10*3/uL (ref 1.7–7.7)
Neutrophils Relative %: 46 %
Platelets: 238 10*3/uL (ref 150–400)
RBC: 3.93 MIL/uL (ref 3.87–5.11)
RDW: 12.6 % (ref 11.5–15.5)
WBC: 6.6 10*3/uL (ref 4.0–10.5)
nRBC: 0 % (ref 0.0–0.2)

## 2021-12-10 LAB — COMPREHENSIVE METABOLIC PANEL
ALT: 58 U/L — ABNORMAL HIGH (ref 0–44)
AST: 54 U/L — ABNORMAL HIGH (ref 15–41)
Albumin: 3.5 g/dL (ref 3.5–5.0)
Alkaline Phosphatase: 36 U/L — ABNORMAL LOW (ref 38–126)
Anion gap: 14 (ref 5–15)
BUN: 21 mg/dL (ref 8–23)
CO2: 20 mmol/L — ABNORMAL LOW (ref 22–32)
Calcium: 9.4 mg/dL (ref 8.9–10.3)
Chloride: 107 mmol/L (ref 98–111)
Creatinine, Ser: 1.77 mg/dL — ABNORMAL HIGH (ref 0.44–1.00)
GFR, Estimated: 31 mL/min — ABNORMAL LOW (ref >60)
Glucose, Bld: 126 mg/dL — ABNORMAL HIGH (ref 70–99)
Potassium: 4 mmol/L (ref 3.5–5.1)
Sodium: 141 mmol/L (ref 135–145)
Total Bilirubin: 0.7 mg/dL (ref 0.3–1.2)
Total Protein: 6.3 g/dL — ABNORMAL LOW (ref 6.5–8.1)

## 2021-12-10 LAB — PHOSPHORUS: Phosphorus: 4 mg/dL (ref 2.5–4.6)

## 2021-12-10 LAB — MAGNESIUM: Magnesium: 1.8 mg/dL (ref 1.7–2.4)

## 2021-12-10 MED ORDER — MORPHINE SULFATE (PF) 2 MG/ML IV SOLN
1.0000 mg | INTRAVENOUS | Status: DC | PRN
  Administered 2021-12-10: 1 mg via INTRAVENOUS
  Administered 2021-12-11: 2 mg via INTRAVENOUS
  Administered 2021-12-11: 1 mg via INTRAVENOUS
  Administered 2021-12-12: 2 mg via INTRAVENOUS
  Administered 2021-12-13: 1 mg via INTRAVENOUS
  Administered 2021-12-13: 2 mg via INTRAVENOUS
  Filled 2021-12-10 (×7): qty 1

## 2021-12-10 MED ORDER — CHLORPROMAZINE HCL 25 MG/ML IJ SOLN
12.5000 mg | Freq: Once | INTRAMUSCULAR | Status: AC
  Administered 2021-12-10: 12.5 mg via INTRAVENOUS
  Filled 2021-12-10 (×2): qty 0.5

## 2021-12-10 MED ORDER — SODIUM CHLORIDE 0.9 % IV SOLN
12.5000 mg | Freq: Every evening | INTRAVENOUS | Status: DC | PRN
  Administered 2021-12-12: 12.5 mg via INTRAVENOUS
  Filled 2021-12-10: qty 0.5

## 2021-12-10 MED ORDER — PANTOPRAZOLE SODIUM 20 MG PO TBEC
20.0000 mg | DELAYED_RELEASE_TABLET | Freq: Two times a day (BID) | ORAL | Status: DC
  Administered 2021-12-10 – 2021-12-13 (×5): 20 mg via ORAL
  Filled 2021-12-10 (×6): qty 1

## 2021-12-10 MED ORDER — MORPHINE SULFATE (PF) 2 MG/ML IV SOLN
2.0000 mg | Freq: Once | INTRAVENOUS | Status: AC
  Administered 2021-12-10: 2 mg via INTRAVENOUS
  Filled 2021-12-10: qty 1

## 2021-12-10 NOTE — Progress Notes (Signed)
Rounding Note    Patient Name: Jade Perez Date of Encounter: 12/10/2021  [place] Cardiologist: Berniece Salines, DO   Subjective   Patient complaining of nausea   Had flank pain earlier   Inpatient Medications    Scheduled Meds:  atorvastatin  40 mg Oral Daily   B-complex with vitamin C  1 tablet Oral Daily   cefadroxil  500 mg Oral BID   citalopram  40 mg Oral Daily   dapagliflozin propanediol  10 mg Oral Daily   fenofibrate  160 mg Oral Daily   heparin  5,000 Units Subcutaneous Q8H   insulin aspart  0-15 Units Subcutaneous TID WC   loratadine  10 mg Oral Daily   montelukast  10 mg Oral QHS   pantoprazole  20 mg Oral Daily   sodium chloride flush  3 mL Intravenous Q12H   Continuous Infusions:  sodium chloride     sodium chloride 75 mL/hr at 12/10/21 0137   PRN Meds: sodium chloride, acetaminophen, albuterol, morphine injection, naLOXone (NARCAN)  injection, nitroGLYCERIN, ondansetron (ZOFRAN) IV, sodium chloride flush, trimethobenzamide   Vital Signs    Vitals:   12/09/21 2020 12/10/21 0328 12/10/21 0658 12/10/21 0714  BP: (!) 124/96 120/70  (!) 166/82  Pulse: 68 69  (!) 57  Resp: 18 16  18   Temp: 98.4 F (36.9 C) 99 F (37.2 C) 97.9 F (36.6 C) 98.4 F (36.9 C)  TempSrc: Oral Oral Oral Oral  SpO2: 98% 98%  96%  Weight:  90 kg    Height:        Intake/Output Summary (Last 24 hours) at 12/10/2021 0902 Last data filed at 12/10/2021 0700 Gross per 24 hour  Intake 357 ml  Output 3101 ml  Net -2744 ml      12/10/2021    3:28 AM 12/09/2021    6:28 AM 12/08/2021    6:30 AM  Last 3 Weights  Weight (lbs) 198 lb 6.4 oz 198 lb 12.8 oz 199 lb 4.7 oz  Weight (kg) 89.994 kg 90.175 kg 90.4 kg      Telemetry    SR - Personally Reviewed  ECG    NO new  - Personally Reviewed  Physical Exam   GEN: No acute distress.   Neck  JVP is normal  Cardiac: RRR, no murmurs, Respiratory: Clear to auscultation bilaterally. GI: Soft, nontender,  non-distended  MS: No edema; No deformity. Neuro:  Nonfocal  Psych: Normal affect   Labs    High Sensitivity Troponin:   Recent Labs  Lab 12/06/21 0849 12/06/21 1053 12/07/21 0624 12/08/21 1152  TROPONINIHS 1,124* 2,511* 3,305* 1,071*     Chemistry Recent Labs  Lab 12/06/21 0834 12/07/21 0510 12/08/21 0314 12/09/21 0240 12/10/21 0227  NA 135   < > 141 138 141  K 3.6   < > 4.0 3.5 4.0  CL 101   < > 108 108 107  CO2 22   < > 25 22 20*  GLUCOSE 220*   < > 132* 148* 126*  BUN 19   < > 14 16 21   CREATININE 1.15*   < > 1.68* 1.87* 1.77*  CALCIUM 9.9   < > 8.9 8.7* 9.4  MG 1.5*   < > 1.8 1.7 1.8  PROT 7.8  --   --  5.6* 6.3*  ALBUMIN 4.5  --   --  3.2* 3.5  AST 36  --   --  42* 54*  ALT 30  --   --  28 58*  ALKPHOS 38  --   --  30* 36*  BILITOT 0.9  --   --  1.1 0.7  GFRNONAA 52*   < > 33* 29* 31*  ANIONGAP 12   < > 8 8 14    < > = values in this interval not displayed.    Lipids  Recent Labs  Lab 12/07/21 0510  CHOL 125  TRIG 138  HDL 38*  LDLCALC 59  CHOLHDL 3.3    Hematology Recent Labs  Lab 12/08/21 0314 12/09/21 0240 12/10/21 0227  WBC 6.6 7.7 6.6  RBC 3.97 3.91 3.93  HGB 12.3 12.0 12.2  HCT 36.5 36.0 35.7*  MCV 91.9 92.1 90.8  MCH 31.0 30.7 31.0  MCHC 33.7 33.3 34.2  RDW 13.0 12.8 12.6  PLT 247 242 238   Thyroid No results for input(s): "TSH", "FREET4" in the last 168 hours.  BNPNo results for input(s): "BNP", "PROBNP" in the last 168 hours.  DDimer No results for input(s): "DDIMER" in the last 168 hours.   Radiology    No results found.  Cardiac Studies   LHC Right dominant coronary anatomy.  Widely patent coronaries without evidence of obstructive disease. Suggestion of mild mid anterior wall hypokinesis with LVEDP 18 mmHg.   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  Rule out myocarditis.  Rule out MINOCA.  Cardiac MRI can be helpful and further identifying etiology.   Echo    9/1  Left ventricular ejection fraction, by estimation, is 50 to 55%. The left ventricle has low normal function. The left ventricle demonstrates regional wall motion abnormalities (see scoring diagram/findings for description). Left ventricular diastolic parameters are consistent with Grade I diastolic dysfunction (impaired relaxation). 1. Right ventricular systolic function is normal. The right ventricular size is normal. Tricuspid regurgitation signal is inadequate for assessing PA pressure. 2. The mitral valve is normal in structure. Trivial mitral valve regurgitation. No evidence of mitral stenosis. 3. The aortic valve is tricuspid. Aortic valve regurgitation is not visualized. No aortic stenosis is present. 4. The inferior vena cava is normal in size with greater than 50% respiratory variability, suggesting right atrial pressure of 3 mmHg. Patient Profile     66 y.o. female  hx of anemia, asthma, DM, HLD, HTN, OSA not on CPAP, who was admitted yesterday with flank pain, N/V and found to have elevated troponin.  No past cardiac hx.   Assessment & Plan     NSTEMI with pk  troponin 3,305 -  yesterday was 1071 --presented with flank pain but night prior she did have mid-epigastric pain with radiation into chest.   -EKG without acute ST changes -cath with normal cors. She does have wall motion abnormalities on echo and cath.   - Plan for MRI as outpt     Nausea.     Pt with nausea this am    I am not convinced anginal equivalent    Follow for now   Agree with fluids    Zofran Will increase protonix to bid      HLD  -on lipitor 40 and LDL is 59  HDL 38   HTN  BP is labile   She is nauseated now which may explain elevation Losartan on hold with Cr still elevated at 1.77 Follow for now   REsume when Cr improves May need to consider another agent if BP remains high       HLD  -on lipitor 40 and LDL is 59  HDL 38        For questions or updates, please contact [facility] Please consult [email or web address] for contact info under        Signed, Dietrich Pates, MD  12/10/2021, 9:02 AM

## 2021-12-10 NOTE — Care Management Important Message (Signed)
Important Message  Patient Details  Name: Jade Perez MRN: [id number] Date of Birth: Sep 07, 1955   Medicare Important Message Given:  Yes     Renie Ora 12/10/2021, 8:41 AM

## 2021-12-10 NOTE — Progress Notes (Signed)
Jade Perez GBT:[id number] DOB: [birth year] DOA: 12/06/2021 PCP: Abner Greenspan, MD   Subj: 66 year old WF PMHx HLD, HTN, DM type II controlled with complication, DM type II nephropathy,   Presented to [facility] due to flank pain, n/v, CT renal stone obtained showed left renal pelvis stone , no hydro , she is found to have elevated troponin, she is started on heparin drip, [facility] to [facility] for cardiology eval.   Obj: 9/4 afebrile overnight, paged by RN patient having refractory nausea.  Positive acute onset of left CVA pain leading to positive nausea positive vomiting.   Objective: VITAL SIGNS: Temp: 98.4 F (36.9 C) (09/04 0714) Temp Source: Oral (09/04 0714) BP: 166/82 (09/04 0714) Pulse Rate: 57 (09/04 0714) SPO2; FIO2:   Intake/Output Summary (Last 24 hours) at 12/10/2021 1103 Last data filed at 12/10/2021 0919 Gross per 24 hour  Intake 357 ml  Output 2901 ml  Net -2544 ml      Exam: General: A/O x4 No acute respiratory distress Lungs: Clear to auscultation bilaterally without wheezes or crackles Cardiovascular: Regular rate and rhythm without murmur gallop or rub normal S1 and S2 Abdomen: Nontender, nondistended, soft, bowel sounds positive, no rebound, no ascites, no appreciable mass, positive LEFT CVA moderate to severe tenderness>>> RIGHT Extremities: No significant cyanosis, clubbing, or edema bilateral lower extremities Skin: Negative rashes, lesions, ulcers Psychiatric:  Negative depression, negative anxiety, negative fatigue, negative mania  Central nervous system:  Cranial nerves II through XII intact, tongue/uvula midline, all extremities muscle strength 5/5, sensation intact throughout, negative dysarthria, negative expressive aphasia, negative receptive aphasia.      Mobility Assessment (last 72 hours)     Mobility Assessment     Row Name 12/09/21 1950 12/08/21 1940 12/08/21 0841 12/07/21 2000     Does patient have an order for bedrest or is patient  medically unstable -- -- No - Continue assessment No - Continue assessment    What is the highest level of mobility based on the progressive mobility assessment? Level 6 (Walks independently in room and hall) - Balance while walking in room without assist - Complete Level 6 (Walks independently in room and hall) - Balance while walking in room without assist - Complete Level 6 (Walks independently in room and hall) - Balance while walking in room without assist - Complete Level 5 (Walks with assist in room/hall) - Balance while stepping forward/back and can walk in room with assist - Complete               DVT prophylaxis: Subcu heparin Code Status: Full Family Communication: 9/4 husband at bedside for discussion of plan of care all questions answered Status is: Inpatient    Dispo: The patient is from: Home              Anticipated d/c is to: Home              Anticipated d/c date is: 1 day              Patient currently is not medically stable to d/c.    Procedures/Significant Events: 9/1 cardiac catheterization Right dominant coronary anatomy.  Widely patent coronaries without evidence of obstructive disease. Suggestion of mild mid anterior wall hypokinesis with LVEDP 18 mmHg.   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  Rule out myocarditis.  Rule out MINOCA.  Cardiac MRI can be helpful and further identifying etiology.  Consultants:  Cardiology   Cultures 8/31 urine positive GROUP B STREP(S.AGALACTIAE  Antimicrobials: Anti-infectives (From admission, onward)    Start     Ordered Stop   12/08/21 1430  cefadroxil (DURICEF) capsule 500 mg        12/08/21 1336     12/07/21 1545  cefTRIAXone (ROCEPHIN) 1 g in sodium chloride 0.9 % 100 mL IVPB  Status:  Discontinued       Note to Pharmacy: Appear to have tolerated ancef in the past, please let me know if rocephin is ok? thx   12/07/21 1456 12/08/21 1336         A/P  NSTEMI -S/p cardiac cath: Normal coronary arteries with right dominant anatomy. Mid anterior wall hypokinesis.  LVEDP 18 mmHg. -No work, driving or lifting for 3 days post cath per cards. -Outpatient cardiology follow-up for cardiac MRI scheduled, by Dr.Gayatri A Jacques [facility] -Currently chest pain free  -Strict in and out -1.4 L - Daily weight Filed Weights   12/08/21 0630 12/09/21 0628 12/10/21 0328  Weight: 90.4 kg 90.2 kg 90 kg     Prolonged QTc - 9/1 QTc= 495 - 9/4 be judicious with use of QTc prolonging medications - 9/4 obtain repeat EKG    Renal stone -Currently no pain, no n/v,  -Follow-up next week with urology.  -9/4 patient's SSX consistent with shifting of renal stone and possible occlusion of ureter.  CT renal stone pending.  If there is occlusion and hydronephrosis will need to reconsult urology -See UTI   positive GROUP B STREP UTI - 9/2 DC ceftriaxone---->Cefadroxil.  Complete 5-day course antibiotics.  AKI (baseline Cr 1.1) Lab Results  Component Value Date   CREATININE 1.77 (H) 12/10/2021   CREATININE 1.87 (H) 12/09/2021   CREATININE 1.68 (H) 12/08/2021   CREATININE 1.01 (H) 12/07/2021   CREATININE 1.15 (H) 12/06/2021  -9/3 creatinine most likely has not peaked post catheterization.  Continue to hydrate   Mild leukocytosis -Does not appear septic, likely due to stress -Blood culture no growth, urine culture in process -Continue hydration for another day, started empiric antibiotic to cover urinary source    DM type II uncontrolled with hyperglycemia diabetes -Am hyperglycemia likely due to stress -a1c 6.9, -Continue  ssi diabetes -Continue farxiga  CBG (last 3)  Recent Labs    12/09/21 1615 12/09/21 2038 12/10/21 0606  GLUCAP 130* 165* 118*    Obesity (BMI 36.31 kg/m) -Address with PCP  Hypomagnesmia - Magnesium goal> 2 -9/3 Magnesium oxide 400 mg  Hypokalemia - Potassium goal> 4 - 9/3 K-Dur 40 mEq  Refractory nausea -9/4  Thorazine 12.5 mg x 1       Care during the described time interval was provided by me .  I have reviewed this patient's available data, including medical history, events of note, physical examination, and all test results as part of my evaluation.

## 2021-12-11 ENCOUNTER — Inpatient Hospital Stay (HOSPITAL_COMMUNITY): Payer: Medicare PPO | Admitting: Anesthesiology

## 2021-12-11 ENCOUNTER — Encounter (HOSPITAL_COMMUNITY): Payer: Self-pay | Admitting: Interventional Cardiology

## 2021-12-11 ENCOUNTER — Encounter (HOSPITAL_COMMUNITY)
Admission: EM | Disposition: A | Discharge status: Home / Self Care | Payer: Self-pay | Source: Home / Self Care | Attending: Internal Medicine

## 2021-12-11 ENCOUNTER — Inpatient Hospital Stay (HOSPITAL_COMMUNITY): Payer: Medicare PPO

## 2021-12-11 DIAGNOSIS — N200 Calculus of kidney: Secondary | ICD-10-CM

## 2021-12-11 DIAGNOSIS — E1165 Type 2 diabetes mellitus with hyperglycemia: Secondary | ICD-10-CM | POA: Diagnosis not present

## 2021-12-11 DIAGNOSIS — E119 Type 2 diabetes mellitus without complications: Secondary | ICD-10-CM | POA: Diagnosis not present

## 2021-12-11 DIAGNOSIS — I10 Essential (primary) hypertension: Secondary | ICD-10-CM

## 2021-12-11 DIAGNOSIS — Z7984 Long term (current) use of oral hypoglycemic drugs: Secondary | ICD-10-CM

## 2021-12-11 DIAGNOSIS — I252 Old myocardial infarction: Secondary | ICD-10-CM | POA: Diagnosis not present

## 2021-12-11 DIAGNOSIS — I214 Non-ST elevation (NSTEMI) myocardial infarction: Secondary | ICD-10-CM | POA: Diagnosis not present

## 2021-12-11 DIAGNOSIS — I5181 Takotsubo syndrome: Secondary | ICD-10-CM

## 2021-12-11 HISTORY — PX: CYSTOSCOPY W/ URETERAL STENT PLACEMENT: SHX1429

## 2021-12-11 LAB — CBC WITH DIFFERENTIAL/PLATELET
Abs Immature Granulocytes: 0.02 10*3/uL (ref 0.00–0.07)
Basophils Absolute: 0 10*3/uL (ref 0.0–0.1)
Basophils Relative: 0 %
Eosinophils Absolute: 0.1 10*3/uL (ref 0.0–0.5)
Eosinophils Relative: 1 %
HCT: 34 % — ABNORMAL LOW (ref 36.0–46.0)
Hemoglobin: 11.6 g/dL — ABNORMAL LOW (ref 12.0–15.0)
Immature Granulocytes: 0 %
Lymphocytes Relative: 18 %
Lymphs Abs: 1.9 10*3/uL (ref 0.7–4.0)
MCH: 31.4 pg (ref 26.0–34.0)
MCHC: 34.1 g/dL (ref 30.0–36.0)
MCV: 91.9 fL (ref 80.0–100.0)
Monocytes Absolute: 1.7 10*3/uL — ABNORMAL HIGH (ref 0.1–1.0)
Monocytes Relative: 16 %
Neutro Abs: 7 10*3/uL (ref 1.7–7.7)
Neutrophils Relative %: 65 %
Platelets: 236 10*3/uL (ref 150–400)
RBC: 3.7 MIL/uL — ABNORMAL LOW (ref 3.87–5.11)
RDW: 13.1 % (ref 11.5–15.5)
WBC: 10.7 10*3/uL — ABNORMAL HIGH (ref 4.0–10.5)
nRBC: 0 % (ref 0.0–0.2)

## 2021-12-11 LAB — COMPREHENSIVE METABOLIC PANEL
ALT: 56 U/L — ABNORMAL HIGH (ref 0–44)
AST: 40 U/L (ref 15–41)
Albumin: 3.1 g/dL — ABNORMAL LOW (ref 3.5–5.0)
Alkaline Phosphatase: 37 U/L — ABNORMAL LOW (ref 38–126)
Anion gap: 7 (ref 5–15)
BUN: 15 mg/dL (ref 8–23)
CO2: 24 mmol/L (ref 22–32)
Calcium: 8.8 mg/dL — ABNORMAL LOW (ref 8.9–10.3)
Chloride: 108 mmol/L (ref 98–111)
Creatinine, Ser: 1.45 mg/dL — ABNORMAL HIGH (ref 0.44–1.00)
GFR, Estimated: 40 mL/min — ABNORMAL LOW (ref >60)
Glucose, Bld: 123 mg/dL — ABNORMAL HIGH (ref 70–99)
Potassium: 3.7 mmol/L (ref 3.5–5.1)
Sodium: 139 mmol/L (ref 135–145)
Total Bilirubin: 0.8 mg/dL (ref 0.3–1.2)
Total Protein: 5.5 g/dL — ABNORMAL LOW (ref 6.5–8.1)

## 2021-12-11 LAB — GLUCOSE, CAPILLARY
Glucose-Capillary: 146 mg/dL — ABNORMAL HIGH (ref 70–99)
Glucose-Capillary: 133 mg/dL — ABNORMAL HIGH (ref 70–99)
Glucose-Capillary: 170 mg/dL — ABNORMAL HIGH (ref 70–99)
Glucose-Capillary: 85 mg/dL (ref 70–99)
Glucose-Capillary: 101 mg/dL — ABNORMAL HIGH (ref 70–99)
Glucose-Capillary: 164 mg/dL — ABNORMAL HIGH (ref 70–99)

## 2021-12-11 LAB — PHOSPHORUS: Phosphorus: 3.6 mg/dL (ref 2.5–4.6)

## 2021-12-11 LAB — CULTURE, BLOOD (ROUTINE X 2)
Culture: NO GROWTH
Culture: NO GROWTH
Special Requests: ADEQUATE

## 2021-12-11 LAB — MAGNESIUM: Magnesium: 1.8 mg/dL (ref 1.7–2.4)

## 2021-12-11 SURGERY — CYSTOSCOPY, WITH RETROGRADE PYELOGRAM AND URETERAL STENT INSERTION
Anesthesia: General | Laterality: Bilateral

## 2021-12-11 MED ORDER — FENTANYL CITRATE (PF) 250 MCG/5ML IJ SOLN
INTRAMUSCULAR | Status: AC
  Filled 2021-12-11: qty 5

## 2021-12-11 MED ORDER — CHLORHEXIDINE GLUCONATE 0.12 % MT SOLN
15.0000 mL | OROMUCOSAL | Status: AC
  Administered 2021-12-11: 15 mL via OROMUCOSAL
  Filled 2021-12-11: qty 15

## 2021-12-11 MED ORDER — LACTATED RINGERS IV SOLN: INTRAVENOUS | Status: DC

## 2021-12-11 MED ORDER — LIDOCAINE 2% (20 MG/ML) 5 ML SYRINGE
INTRAMUSCULAR | Status: AC
  Filled 2021-12-11: qty 5

## 2021-12-11 MED ORDER — FENTANYL CITRATE (PF) 250 MCG/5ML IJ SOLN
INTRAMUSCULAR | Status: DC | PRN
Start: 2021-12-11 — End: 2021-12-11
  Administered 2021-12-11 (×2): 50 ug via INTRAVENOUS

## 2021-12-11 MED ORDER — DEXAMETHASONE SODIUM PHOSPHATE 10 MG/ML IJ SOLN
INTRAMUSCULAR | Status: DC | PRN
  Administered 2021-12-11: 5 mg via INTRAVENOUS

## 2021-12-11 MED ORDER — PROPOFOL 10 MG/ML IV BOLUS
INTRAVENOUS | Status: AC
  Filled 2021-12-11: qty 20

## 2021-12-11 MED ORDER — IOHEXOL 300 MG/ML  SOLN
INTRAMUSCULAR | Status: DC | PRN
  Administered 2021-12-11: 11 mL

## 2021-12-11 MED ORDER — ONDANSETRON HCL 4 MG/2ML IJ SOLN
INTRAMUSCULAR | Status: AC
  Filled 2021-12-11: qty 2

## 2021-12-11 MED ORDER — MIDAZOLAM HCL 2 MG/2ML IJ SOLN
INTRAMUSCULAR | Status: DC | PRN
  Administered 2021-12-11: 2 mg via INTRAVENOUS

## 2021-12-11 MED ORDER — LIDOCAINE 2% (20 MG/ML) 5 ML SYRINGE
INTRAMUSCULAR | Status: DC | PRN
  Administered 2021-12-11: 80 mg via INTRAVENOUS

## 2021-12-11 MED ORDER — ONDANSETRON HCL 4 MG/2ML IJ SOLN
INTRAMUSCULAR | Status: DC | PRN
  Administered 2021-12-11: 4 mg via INTRAVENOUS

## 2021-12-11 MED ORDER — SUCCINYLCHOLINE CHLORIDE 200 MG/10ML IV SOSY
PREFILLED_SYRINGE | INTRAVENOUS | Status: DC | PRN
  Administered 2021-12-11: 120 mg via INTRAVENOUS

## 2021-12-11 MED ORDER — SODIUM CHLORIDE 0.9 % IR SOLN
Status: DC | PRN
  Administered 2021-12-11: 3000 mL

## 2021-12-11 MED ORDER — ALBUTEROL SULFATE HFA 108 (90 BASE) MCG/ACT IN AERS
INHALATION_SPRAY | RESPIRATORY_TRACT | Status: DC | PRN
  Administered 2021-12-11: 4 via RESPIRATORY_TRACT

## 2021-12-11 MED ORDER — EPHEDRINE 5 MG/ML INJ
INTRAVENOUS | Status: AC
  Filled 2021-12-11: qty 5

## 2021-12-11 MED ORDER — MIDAZOLAM HCL 2 MG/2ML IJ SOLN
INTRAMUSCULAR | Status: AC
Start: 2021-12-11 — End: ?
  Filled 2021-12-11: qty 2

## 2021-12-11 MED ORDER — CEFAZOLIN (ANCEF) 1 G IV SOLR: 2.0000 g | INTRAVENOUS | Status: DC

## 2021-12-11 MED ORDER — CEFAZOLIN SODIUM 1 G IJ SOLR
INTRAMUSCULAR | Status: AC
  Filled 2021-12-11: qty 20

## 2021-12-11 MED ORDER — PROPOFOL 10 MG/ML IV BOLUS
INTRAVENOUS | Status: DC | PRN
  Administered 2021-12-11: 150 mg via INTRAVENOUS

## 2021-12-11 MED ORDER — DEXAMETHASONE SODIUM PHOSPHATE 10 MG/ML IJ SOLN
INTRAMUSCULAR | Status: AC
Start: 2021-12-11 — End: ?
  Filled 2021-12-11: qty 1

## 2021-12-11 SURGICAL SUPPLY — 23 items
BAG DRN RND TRDRP ANRFLXCHMBR (UROLOGICAL SUPPLIES)
BAG URINE DRAIN 2000ML AR STRL (UROLOGICAL SUPPLIES) ×1 IMPLANT
BAG URO CATCHER STRL LF (MISCELLANEOUS) ×1 IMPLANT
CATH FOLEY 2WAY SLVR  5CC 16FR (CATHETERS)
CATH FOLEY 2WAY SLVR 5CC 16FR (CATHETERS) IMPLANT
CATH INTERMIT  6FR 70CM (CATHETERS) ×1 IMPLANT
GLOVE BIOGEL M 8.0 STRL (GLOVE) ×1 IMPLANT
GOWN STRL REUS W/ TWL LRG LVL3 (GOWN DISPOSABLE) ×1 IMPLANT
GOWN STRL REUS W/ TWL XL LVL3 (GOWN DISPOSABLE) ×1 IMPLANT
GOWN STRL REUS W/TWL LRG LVL3 (GOWN DISPOSABLE) ×1
GOWN STRL REUS W/TWL XL LVL3 (GOWN DISPOSABLE) ×1
GUIDEWIRE ANG ZIPWIRE 038X150 (WIRE) IMPLANT
GUIDEWIRE STR DUAL SENSOR (WIRE) ×1 IMPLANT
KIT TURNOVER KIT B (KITS) ×1 IMPLANT
MANIFOLD NEPTUNE II (INSTRUMENTS) IMPLANT
NS IRRIG 1000ML POUR BTL (IV SOLUTION) IMPLANT
PACK CYSTO (CUSTOM PROCEDURE TRAY) ×1 IMPLANT
STENT URET 6FRX24 CONTOUR (STENTS) IMPLANT
STENT URET 6FRX26 CONTOUR (STENTS) IMPLANT
SYPHON OMNI JUG (MISCELLANEOUS) ×1 IMPLANT
TOWEL GREEN STERILE FF (TOWEL DISPOSABLE) ×1 IMPLANT
TUBE CONNECTING 12X1/4 (SUCTIONS) IMPLANT
WATER STERILE IRR 3000ML UROMA (IV SOLUTION) ×1 IMPLANT

## 2021-12-11 NOTE — Transfer of Care (Signed)
Immediate Anesthesia Transfer of Care Note  Patient: Jade Perez  Procedure(s) Performed: CYSTOSCOPY , BILATERAL RETROGRADE , BILATERAL STENT PLACEMENT (Bilateral)  Patient Location: PACU  Anesthesia Type:General  Level of Consciousness: drowsy and patient cooperative  Airway & Oxygen Therapy: Patient Spontanous Breathing and Patient connected to nasal cannula oxygen  Post-op Assessment: Report given to RN, Post -op Vital signs reviewed and stable and Patient moving all extremities X 4  Post vital signs: Reviewed and stable  Last Vitals:  Vitals Value Taken Time  BP 151/78 12/11/21 1830  Temp    Pulse 83 12/11/21 1832  Resp 18 12/11/21 1832  SpO2 94 % 12/11/21 1832  Vitals shown include unvalidated device data.  Last Pain:  Vitals:   12/11/21 1651  TempSrc: Oral  PainSc:          Complications: No notable events documented.

## 2021-12-11 NOTE — Op Note (Signed)
NAME: [name], [name] MEDICAL RECORD NO: [id number] ACCOUNT NO: [id number] DATE OF BIRTH: 1955-06-16 FACILITY: [facility] LOCATION: [id number] PHYSICIAN: Sebastian Ache, MD  Operative Report   DATE OF PROCEDURE: 12/11/2021  PREOPERATIVE DIAGNOSIS:  Left ureteral, bilateral renal stones and refractory colic.  PROCEDURE PERFORMED: 1.  Cystoscopy with bilateral retrograde pyelograms interpretation. 2.  Insertion of bilateral ureteral stents.  ESTIMATED BLOOD LOSS:  Nil.  COMPLICATIONS:  None.  SPECIMEN:  None.  FINDINGS: 1.  Left proximal ureteral stone with mild hydronephrosis. 2.  Successful placement of bilateral ureteral stents, proximal end in the renal pelvis, distal end in urinary bladder.  No tether.  INDICATIONS:  The patient is a pleasant 66 year old lady with history of recurrent urolithiasis.  She was found on workup of intermittent colicky flank pain last week to have some nonobstructing renal stones.  She was admitted for pain control.  She is  nearing discharge.  However, her pain became acutely worse on her left sided.  Yesterday, she underwent repeat imaging, which revealed that she had progressed to obstructing left ureteral stone as well as bilateral renal stones.  Options discussed  including continued medical therapy versus more aggressive therapy with overall plan of stone free and she wished to proceed with the latter. Purposely, it is not allow ureteroscopy in acute setting, therefore, it is most prudent to have the renal  decompression of bilateral stents to allow for pain control, ureteral dilation and then plan on bilateral ureteroscopy in the elective setting.  She wished to proceed with stenting today.  Informed consent was obtained and placed in medical record.  PROCEDURE DETAILS:  The patient being Jade Perez verified and procedure being bilateral retrograde stent placement confirmed.  Procedure timeout was performed.  Intravenous antibiotics were administered.  General  endotracheal anesthesia induced.  The  patient was placed into a low lithotomy position.  Sterile field was created, prepped and draped the patient's vagina, introitus, and proximal thighs using iodine.  Cystourethroscopy was performed using a rigid cystoscope with offset lens.  Inspection of  urinary bladder revealed no diverticula, calcifications, papillary lesions.  Ureteral orifices appeared single.  The left ureteral orifice was cannulated with 6-French end-hole catheter and left retrograde pyelogram was obtained.  Left retrograde pyelogram demonstrated single left ureter, single system left kidney.  There was a proximal filling defect consistent with known stone.  There was mild hydronephrosis.  A 0.038 sensor wire was advanced to lower pole over which a new 6 x  24 Contour type stent was placed using cystoscopic and fluoroscopic guidance.  Good proximal and distal planes were noted.  Efflux of urine was seen through the distal end of the stent.  Next, right retrograde pyelogram was obtained.  Right retrograde pyelogram demonstrated single right ureter, single system right kidney.  No filling defects or narrowing noted.  Sensor wire was once again advanced, over which a new 6 x 24 Contour type stent was placed using cystoscopic and  fluoroscopic guidance.  Good proximal and distal planes were noted.  Efflux was seen through the distal end of the stent.  Bladder was emptied per cystoscope.  Procedure was then terminated.  The patient tolerated the procedure well, no immediate  periprocedural complications.  The patient was taken to postanesthesia care in stable condition.  Plan for continued hospital admission. Hopefully, discharge tomorrow or the following day and plan for bilateral ureteroscopy in the elective setting with goal of stone free.   NIK D: 12/11/2021 6:25:07 pm T: 12/11/2021 9:45:00 pm  JOB: [id number]

## 2021-12-11 NOTE — Progress Notes (Addendum)
Rounding Note    Patient Name: Jade Perez Date of Encounter: 12/11/2021  [place] Cardiologist: Berniece Salines, DO   Subjective   Feeling much better this morning. Nausea resolved. Walking in the hallway.  Inpatient Medications    Scheduled Meds:  atorvastatin  40 mg Oral Daily   B-complex with vitamin C  1 tablet Oral Daily   cefadroxil  500 mg Oral BID   citalopram  40 mg Oral Daily   fenofibrate  160 mg Oral Daily   heparin  5,000 Units Subcutaneous Q8H   insulin aspart  0-15 Units Subcutaneous TID WC   loratadine  10 mg Oral Daily   montelukast  10 mg Oral QHS   pantoprazole  20 mg Oral BID   sodium chloride flush  3 mL Intravenous Q12H   Continuous Infusions:  sodium chloride     sodium chloride 75 mL/hr at 12/11/21 0829   chlorproMAZINE (THORAZINE) 12.5 mg in sodium chloride 0.9 % 25 mL IVPB     PRN Meds: sodium chloride, acetaminophen, albuterol, chlorproMAZINE (THORAZINE) 12.5 mg in sodium chloride 0.9 % 25 mL IVPB, morphine injection, naLOXone (NARCAN)  injection, nitroGLYCERIN, ondansetron (ZOFRAN) IV, sodium chloride flush, trimethobenzamide   Vital Signs    Vitals:   12/10/21 0714 12/10/21 1931 12/11/21 0658 12/11/21 0700  BP: (!) 166/82 (!) 153/81 132/67   Pulse: (!) 57 64    Resp: 18 17 18    Temp: 98.4 F (36.9 C) 98.9 F (37.2 C) 98.7 F (37.1 C)   TempSrc: Oral Oral Oral   SpO2: 96% 100% 97%   Weight:    89.3 kg  Height:        Intake/Output Summary (Last 24 hours) at 12/11/2021 0932 Last data filed at 12/11/2021 0700 Gross per 24 hour  Intake 3734.32 ml  Output 1700 ml  Net 2034.32 ml      12/11/2021    7:00 AM 12/10/2021    3:28 AM 12/09/2021    6:28 AM  Last 3 Weights  Weight (lbs) 196 lb 14.4 oz 198 lb 6.4 oz 198 lb 12.8 oz  Weight (kg) 89.313 kg 89.994 kg 90.175 kg      Telemetry    Sinus Bradycardia - Personally Reviewed  ECG    No new tracing this morning  Physical Exam   GEN: No acute distress.   Neck:  No JVD Cardiac: RRR, no murmurs, rubs, or gallops.  Respiratory: Clear to auscultation bilaterally. GI: Soft, nontender, non-distended  MS: No edema; No deformity. Neuro:  Nonfocal  Psych: Normal affect   Labs    High Sensitivity Troponin:   Recent Labs  Lab 12/06/21 0849 12/06/21 1053 12/07/21 0624 12/08/21 1152  TROPONINIHS 1,124* 2,511* 3,305* 1,071*     Chemistry Recent Labs  Lab 12/09/21 0240 12/10/21 0227 12/11/21 0248  NA 138 141 139  K 3.5 4.0 3.7  CL 108 107 108  CO2 22 20* 24  GLUCOSE 148* 126* 123*  BUN 16 21 15   CREATININE 1.87* 1.77* 1.45*  CALCIUM 8.7* 9.4 8.8*  MG 1.7 1.8 1.8  PROT 5.6* 6.3* 5.5*  ALBUMIN 3.2* 3.5 3.1*  AST 42* 54* 40  ALT 28 58* 56*  ALKPHOS 30* 36* 37*  BILITOT 1.1 0.7 0.8  GFRNONAA 29* 31* 40*  ANIONGAP 8 14 7     Lipids  Recent Labs  Lab 12/07/21 0510  CHOL 125  TRIG 138  HDL 38*  LDLCALC 59  CHOLHDL 3.3    Hematology  Recent Labs  Lab 12/09/21 0240 12/10/21 0227 12/11/21 0248  WBC 7.7 6.6 10.7*  RBC 3.91 3.93 3.70*  HGB 12.0 12.2 11.6*  HCT 36.0 35.7* 34.0*  MCV 92.1 90.8 91.9  MCH 30.7 31.0 31.4  MCHC 33.3 34.2 34.1  RDW 12.8 12.6 13.1  PLT 242 238 236   Thyroid No results for input(s): "TSH", "FREET4" in the last 168 hours.  BNPNo results for input(s): "BNP", "PROBNP" in the last 168 hours.  DDimer No results for input(s): "DDIMER" in the last 168 hours.   Radiology    CT RENAL STONE STUDY  Result Date: 12/10/2021 CLINICAL DATA:  Flank pain, kidney stones suspected EXAM: CT ABDOMEN AND PELVIS WITHOUT CONTRAST TECHNIQUE: Multidetector CT imaging of the abdomen and pelvis was performed following the standard protocol without IV contrast. RADIATION DOSE REDUCTION: This exam was performed according to the departmental dose-optimization program which includes automated exposure control, adjustment of the mA and/or kV according to patient size and/or use of iterative reconstruction technique. COMPARISON:   12/06/2021 FINDINGS: Lower chest: New trace right pleural effusion.  Small hiatal hernia. Hepatobiliary: No solid liver abnormality is seen. Hepatic steatosis. No gallstones, gallbladder wall thickening, or biliary dilatation. Pancreas: Unremarkable. No pancreatic ductal dilatation or surrounding inflammatory changes. Spleen: Normal in size without significant abnormality. Adrenals/Urinary Tract: Adrenal glands are unremarkable. Unchanged 0.8 cm calculus at the left ureteropelvic junction with mild associated left hydronephrosis. Numerous additional bilateral renal calculi. No right-sided hydronephrosis. Bladder is unremarkable. Stomach/Bowel: Stomach is within normal limits. Appendix is surgically absent. No evidence of bowel wall thickening, distention, or inflammatory changes. Descending and sigmoid diverticulosis. Vascular/Lymphatic: No significant vascular findings are present. No enlarged abdominal or pelvic lymph nodes. Reproductive: No mass or other significant abnormality. Other: Small, fat containing midline ventral hernia (series 3, image 52). No ascites. Musculoskeletal: No acute or significant osseous findings. IMPRESSION: 1. Unchanged 0.8 cm calculus at the left ureteropelvic junction with mild associated left hydronephrosis. 2. Numerous additional bilateral renal calculi. No right-sided hydronephrosis. 3. Hepatic steatosis. 4. New trace right pleural effusion. Electronically Signed   By: Delanna Ahmadi M.D.   On: 12/10/2021 21:30    Cardiac Studies   Cath: 12/07/21  CONCLUSIONS:   Right dominant coronary anatomy.  Widely patent coronaries without evidence of obstructive disease. Suggestion of mild mid anterior wall hypokinesis with LVEDP 18 mmHg.   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  Rule out myocarditis.  Rule out [place].  Cardiac MRI can be helpful and further identifying etiology.  Echo: 12/07/21  IMPRESSIONS     1. Left ventricular  ejection fraction, by estimation, is 50 to 55%. The  left ventricle has low normal function. The left ventricle demonstrates  regional wall motion abnormalities (see scoring diagram/findings for  description). Left ventricular diastolic   parameters are consistent with Grade I diastolic dysfunction (impaired  relaxation).   2. Right ventricular systolic function is normal. The right ventricular  size is normal. Tricuspid regurgitation signal is inadequate for assessing  PA pressure.   3. The mitral valve is normal in structure. Trivial mitral valve  regurgitation. No evidence of mitral stenosis.   4. The aortic valve is tricuspid. Aortic valve regurgitation is not  visualized. No aortic stenosis is present.   5. The inferior vena cava is normal in size with greater than 50%  respiratory variability, suggesting right atrial pressure of 3 mmHg.   FINDINGS   Left Ventricle: Left ventricular ejection fraction, by estimation, is 50  to 55%. The left ventricle has low normal function. The left ventricle  demonstrates regional wall motion abnormalities. The left ventricular  internal cavity size was normal in  size. There is no left ventricular hypertrophy. Left ventricular diastolic  parameters are consistent with Grade I diastolic dysfunction (impaired  relaxation).      LV Wall Scoring:  The antero-lateral wall, inferior wall, and posterior wall are  hypokinetic.   Right Ventricle: The right ventricular size is normal. No increase in  right ventricular wall thickness. Right ventricular systolic function is  normal. Tricuspid regurgitation signal is inadequate for assessing PA  pressure. The tricuspid regurgitant  velocity is 1.37 m/s, and with an assumed right atrial pressure of 3 mmHg,  the estimated right ventricular systolic pressure is 10.5 mmHg.   Left Atrium: Left atrial size was normal in size.   Right Atrium: Right atrial size was normal in size.   Pericardium: There is  no evidence of pericardial effusion.   Mitral Valve: The mitral valve is normal in structure. Trivial mitral  valve regurgitation. No evidence of mitral valve stenosis.   Tricuspid Valve: The tricuspid valve is normal in structure. Tricuspid  valve regurgitation is not demonstrated. No evidence of tricuspid  stenosis.   Aortic Valve: The aortic valve is tricuspid. Aortic valve regurgitation is  not visualized. Aortic regurgitation PHT measures 718 msec. No aortic  stenosis is present. Aortic valve mean gradient measures 3.0 mmHg. Aortic  valve peak gradient measures 6.7  mmHg. Aortic valve area, by VTI measures 1.39 cm.   Pulmonic Valve: The pulmonic valve was not well visualized. Pulmonic valve  regurgitation is not visualized. No evidence of pulmonic stenosis.   Aorta: The aortic root is normal in size and structure.   Venous: The inferior vena cava is normal in size with greater than 50%  respiratory variability, suggesting right atrial pressure of 3 mmHg.   IAS/Shunts: The interatrial septum was not well visualized.    Patient Profile     66 y.o. female with PMH of anemia, asthma, DM, HLD, HTN, OSA not on CPAP, who was admitted yesterday with flank pain, N/V and found to have elevated troponin.  No past cardiac hx.   Assessment & Plan    NSTEMI -- hsTn peaked at 3305, underwent cardiac cath noted above with normal coronaries. Does have rWMA on echo/cath suggestive of stress cardiomyopathy. Has been recommended for outpatient cardiac MRI which is ordered, along with follow up  N/V Flank pain UTI Renal stone 0.8 cm/numerous bilateral calculi -- nausea significant improved, received IVFs -- protonix was increased to 40mg  BID yesterday -- antibiotics per primary team  HLD -- LDL 59, HDL 38 -- on statin  HTN -- blood pressures were labile yesterday, losartan was held with AKI -- stable today  DM -- Hgb A1c 6.9 -- has been on farxiga but would hold this in the  setting of UTI with plans to resume as an outpatient   For questions or updates, please contact [facility] Please consult [email or web address] for contact info under        Signed, , NP  12/11/2021, 9:32 AM    I have examined the patient and reviewed assessment and plan and discussed with patient.  Agree with above as stated.    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  SHe did not really have a traditional NSTEMI.  Elevated troponin due to nonischemic cause.  Doing well from cardiac standpoint.  Getting ureteral stent  later today for 8 mm kidney stone, per her report.   Lance Muss

## 2021-12-11 NOTE — Brief Op Note (Signed)
12/11/2021  6:21 PM  PATIENT:  Tamala Fothergill  66 y.o. female  PRE-OPERATIVE DIAGNOSIS:  bilateral  stones  POST-OPERATIVE DIAGNOSIS:  bilateral  stones  PROCEDURE:  Procedure(s): CYSTOSCOPY , BILATERAL RETROGRADE , BILATERAL STENT PLACEMENT (Bilateral)  SURGEON:  Surgeon(s) and Role:    * Sebastian Ache, MD - Primary  PHYSICIAN ASSISTANT:   ASSISTANTS: none   ANESTHESIA:   general  EBL:  minimal   BLOOD ADMINISTERED:none  DRAINS: none   LOCAL MEDICATIONS USED:  NONE  SPECIMEN:  No Specimen  DISPOSITION OF SPECIMEN:  N/A  COUNTS:  YES  TOURNIQUET:  * No tourniquets in log *  DICTATION: .Other Dictation: Dictation Number [id number]  PLAN OF CARE: Admit to inpatient   PATIENT DISPOSITION:  PACU - hemodynamically stable.   Delay start of Pharmacological VTE agent (>24hrs) due to surgical blood loss or risk of bleeding: yes

## 2021-12-11 NOTE — Progress Notes (Signed)
Patient had a full breakfast at 9:45am per RN report. Anesthesia made aware and stated surgery would have to be postponed until 5:45pm. MD and OR made aware.

## 2021-12-11 NOTE — Progress Notes (Signed)
Jade Perez Line NLG:[id number] DOB: Feb 28, 1956 DOA: 12/06/2021 PCP: Abner Greenspan, MD   Subj: 66 year old WF PMHx HLD, HTN, DM type II controlled with complication, DM type II nephropathy,   Presented to [facility] due to flank pain, n/v, CT renal stone obtained showed left renal pelvis stone , no hydro , she is found to have elevated troponin, she is started on heparin drip, admitted to [facility] for cardiology eval.   Obj: 9/5 afebrile overnight A/O x4, positive bilateral CVA tenderness but managed with pain medication.  Negative nausea, negative vomiting   Objective: VITAL SIGNS: Temp: 98.7 F (37.1 C) (09/05 0658) Temp Source: Oral (09/05 0658) BP: 132/67 (09/05 0658) SPO2; FIO2:   Intake/Output Summary (Last 24 hours) at 12/11/2021 0814 Last data filed at 12/11/2021 0700 Gross per 24 hour  Intake 3734.32 ml  Output 1700 ml  Net 2034.32 ml      Exam: General: A/O x4 No acute respiratory distress Lungs: Clear to auscultation bilaterally without wheezes or crackles Cardiovascular: Regular rate and rhythm without murmur gallop or rub normal S1 and S2 Abdomen: Nontender, nondistended, soft, bowel sounds positive, no rebound, no ascites, no appreciable mass, positive LEFT CVA moderate tenderness>>> RIGHT Extremities: No significant cyanosis, clubbing, or edema bilateral lower extremities Skin: Negative rashes, lesions, ulcers Psychiatric:  Negative depression, negative anxiety, negative fatigue, negative mania  Central nervous system:  Cranial nerves II through XII intact, tongue/uvula midline, all extremities muscle strength 5/5, sensation intact throughout, negative dysarthria, negative expressive aphasia, negative receptive aphasia.      Mobility Assessment (last 72 hours)     Mobility Assessment     Row Name 12/09/21 1950 12/08/21 1940         What is the highest level of mobility based on the progressive mobility assessment? Level 6 (Walks independently in room and  hall) - Balance while walking in room without assist - Complete Level 6 (Walks independently in room and hall) - Balance while walking in room without assist - Complete                 DVT prophylaxis: Subcu heparin Code Status: Full Family Communication: 9/4 husband at bedside for discussion of plan of care all questions answered Status is: Inpatient    Dispo: The patient is from: Home              Anticipated d/c is to: Home              Anticipated d/c date is: 1 day              Patient currently is not medically stable to d/c.    Procedures/Significant Events: 9/1 cardiac catheterization Right dominant coronary anatomy.  Widely patent coronaries without evidence of obstructive disease. Suggestion of mild mid anterior wall hypokinesis with LVEDP 18 mmHg.   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  Rule out myocarditis.  Rule out [place].  Cardiac MRI can be helpful and further identifying etiology. 9/4 CT renal stone study Unchanged 0.8 cm calculus at the left ureteropelvic junction with mild associated left hydronephrosis. -. Numerous additional bilateral renal calculi. No right-sided hydronephrosis. -Hepatic steatosis. -New trace right pleural effusion.  Consultants:  [facility] Dr. Angelica Chessman   Cultures 8/31 urine positive GROUP B STREP(S.AGALACTIAE    Antimicrobials: Anti-infectives (From admission, onward)    Start     Ordered Stop   12/08/21 1430  cefadroxil (DURICEF) capsule 500 mg  12/08/21 1336     12/07/21 1545  cefTRIAXone (ROCEPHIN) 1 g in sodium chloride 0.9 % 100 mL IVPB  Status:  Discontinued       Note to Pharmacy: Appear to have tolerated ancef in the past, please let me know if rocephin is ok? thx   12/07/21 1456 12/08/21 1336        A/P  NSTEMI -S/p cardiac cath: Normal coronary arteries with right dominant anatomy. Mid anterior wall hypokinesis.  LVEDP 18 mmHg. -No  work, driving or lifting for 3 days post cath per cards. -Outpatient cardiology follow-up for cardiac MRI scheduled, by Dr.Gayatri A Jacques [facility] -Currently chest pain free  -Strict in and out +1.6 L - Daily weight Filed Weights   12/09/21 0628 12/10/21 0328 12/11/21 0700  Weight: 90.2 kg 90 kg 89.3 kg     Prolonged QTc - 9/1 QTc= 495 - 9/4 be judicious with use of QTc prolonging medications - 9/4 obtain repeat EKG: Not done    Renal stone -Currently no pain, no n/v,  - Dr Parke Poisson Hospitalist Reviewed CT renal stone result with urology Dr. Mena Goes -Follow-up next week with urology.  -9/4 patient's SSX consistent with shifting of renal stone and possible occlusion of ureter.  CT renal stone pending.  If there is occlusion and hydronephrosis will need to reconsult urology -See UTI  -9/5 discussed case with Dr. Berneice [facility]: Scheduled for bilateral stent placement later today   positive GROUP B STREP UTI - 9/2 DC ceftriaxone---->Cefadroxil.  Complete 5-day course antibiotics.  AKI (baseline Cr 1.1) Lab Results  Component Value Date   CREATININE 1.45 (H) 12/11/2021   CREATININE 1.77 (H) 12/10/2021   CREATININE 1.87 (H) 12/09/2021   CREATININE 1.68 (H) 12/08/2021   CREATININE 1.01 (H) 12/07/2021  -9/3 creatinine most likely has not peaked post catheterization.  Continue to hydrate -9/5 trending down   Mild leukocytosis -Does not appear septic, likely due to stress -Blood culture no growth, urine culture in process -Continue hydration for another day, started empiric antibiotic to cover urinary source  DM type II uncontrolled with hyperglycemia diabetes -Am hyperglycemia likely due to stress -a1c 6.9, -Continue  ssi diabetes -Continue farxiga  CBG (last 3)  Recent Labs    12/10/21 1630 12/10/21 2105 12/11/21 0659  GLUCAP 131* 145* 133*    Obesity (BMI 36.31 kg/m) -Address with PCP  Hypomagnesmia - Magnesium goal> 2 -9/3 Magnesium oxide 400  mg  Hypokalemia - Potassium goal> 4 - 9/3 K-Dur 40 mEq  Refractory nausea -9/4 Thorazine 12.5 mg x 1       Care during the described time interval was provided by me .  I have reviewed this patient's available data, including medical history, events of note, physical examination, and all test results as part of my evaluation.

## 2021-12-11 NOTE — Consult Note (Signed)
Reason for Consult:Left Ureteral, Bilateral Renal Stones  Referring Physician: Dia Crawford MD  Jade Perez is an 66 y.o. female.   HPI:   1 - Recurrent Urolithiasis: Pre 2023 - SWLx1, MET x 1 12/2021 - Left Ureteral, Bilateral Renal Stones - 48m left UPJ stone and scattered L>R renal tones on CT 11/2021 during admission for flank /abd pain. Sones initially non-obstrucitng, but repeat imaging 9/5 with new left obstruction.  PMH sig for obesity, CAD, DM2. Her PCP is BMarco CollieMD [facility] [place] "[name] is seen in consultation for above.   Past Medical History:  Diagnosis Date   Anemia    anemic in 2010   Anxiety    Arthritis    thumb and fingers, knee, right hip   Asthma    takes montelukast-when sick   Cataract    forming    Complication of anesthesia    difficulty waking up   Depression    Diabetes mellitus without complication ([place]    type 2, diagnosed 2008   GERD (gastroesophageal reflux disease)    omeprazole and ranitidine   Heart murmur    since birth very slight   High cholesterol    History of hiatal hernia    small   History of kidney stones    december 3- January 7th   Hypertension    Sleep apnea    [place] but does not use CPAP, [facility]   Past Surgical History:  Procedure Laterality Date   ABDOMINAL EXPLORATION SURGERY     APPENDECTOMY     BREAST EXCISIONAL BIOPSY Left    benign   CARPAL TUNNEL RELEASE Right 05/22/2017   Procedure: RIGHT CARPAL TUNNEL RELEASE;  Surgeon: KDaryll Brod MD;  [place]  Service: Orthopedics;  Laterality: Right;   CARPAL TUNNEL RELEASE Left 03/12/2018   Procedure: LEFT CARPAL TUNNEL RELEASE;  Surgeon: KDaryll Brod MD;  [place]  Service: Orthopedics;  Laterality: Left;   COLONOSCOPY  06/04/2012   cyst removed     right breast two   DILATION AND CURETTAGE OF UTERUS     EYE  SURGERY Left    laser surgery on retina for tear-done in office   LEFT HEART CATH AND CORONARY ANGIOGRAPHY N/A 12/07/2021   Procedure: LEFT HEART CATH AND CORONARY ANGIOGRAPHY;  Surgeon: SBelva Crome MD;  [facility];  Service: Cardiovascular;  Laterality: N/A;   LIPOMA EXCISION     LIPOMA EXCISION     right shoulder   LITHOTRIPSY  03/2017   RETINAL DETACHMENT SURGERY Right    SCLERAL BUCKLE  10/15/2011   Procedure: SCLERAL BUCKLE;  Surgeon: JHayden Pedro MD;  [place] [facility]  Service: Ophthalmology;  Laterality: Right;   SCLERAL BUCKLE PROCEDURE Right    SHOULDER ARTHROSCOPY WITH ROTATOR CUFF REPAIR Right 03/25/2019   Procedure: Right shoulder arthroscopy, subacromial decompression, distal clavicle resection, rotator cuff repair;  Surgeon: SJustice Britain MD;  [facility];  Service: Orthopedics;  Laterality: Right;  1274m   UPPER GASTROINTESTINAL ENDOSCOPY  05/25/2014   WISDOM TOOTH EXTRACTION      Family History  Problem Relation Age of Onset   Unexplained death Sister [id number]     died in her sleep   Colon cancer Paternal Grandmother    Colon cancer Paternal Uncle    Allergic rhinitis Neg Hx  Angioedema Neg Hx    Asthma Neg Hx    Atopy Neg Hx    Eczema Neg Hx    Immunodeficiency Neg Hx    Urticaria Neg Hx    Colon polyps Neg Hx    Esophageal cancer Neg Hx    Stomach cancer Neg Hx    Rectal cancer Neg Hx    Breast cancer Neg Hx     Social History:  reports that she has never smoked. She has never used smokeless tobacco. She reports that she does not drink alcohol and does not use drugs.  Allergies:  Allergies  Allergen Reactions   Amoxicillin Hives    Did it involve swelling of the face/tongue/throat, SOB, or low BP? No Did it involve sudden or severe rash/hives, skin peeling, or any reaction on the inside of your mouth or nose? No Did you need to seek medical attention at a hospital or doctor's office? No When did it last happen?      15-20  years ago If all above answers are "NO", may proceed with cephalosporin use.    Aspirin Swelling   Bee Venom    Clarithromycin    Dog Epithelium Allergy Skin Test    Erythromycin Nausea Only and Other (See Comments)    Stomach pain, also   Ibuprofen Swelling   Molds & Smuts    Nsaids    Other Other (See Comments)    Feline dander   Oxycodone-Acetaminophen     Other reaction(s): Not available Not allergic to acetaminophen, takes it at home.   Percocet [Oxycodone-Acetaminophen] Itching   Sulfa Antibiotics Hives   Latex Rash   Tape Rash and Other (See Comments)    Band-Aids, also = blisters    Medications: I have reviewed the patient's current medications.  Results for orders placed or performed during the hospital encounter of 12/06/21 (from the past 48 hour(s))  Glucose, capillary     Status: Abnormal   Collection Time: 12/09/21 11:26 AM  Result Value Ref Range   Glucose-Capillary 122 (H) 70 - 99 mg/dL    Comment: Glucose reference range applies only to samples taken after fasting for at least 8 hours.  Glucose, capillary     Status: Abnormal   Collection Time: 12/09/21  4:15 PM  Result Value Ref Range   Glucose-Capillary 130 (H) 70 - 99 mg/dL    Comment: Glucose reference range applies only to samples taken after fasting for at least 8 hours.  Glucose, capillary     Status: Abnormal   Collection Time: 12/09/21  8:38 PM  Result Value Ref Range   Glucose-Capillary 165 (H) 70 - 99 mg/dL    Comment: Glucose reference range applies only to samples taken after fasting for at least 8 hours.   Comment 1 Notify RN    Comment 2 Document in Chart   CBC with Differential/Platelet     Status: Abnormal   Collection Time: 12/10/21  2:27 AM  Result Value Ref Range   WBC 6.6 4.0 - 10.5 K/uL   RBC 3.93 3.87 - 5.11 MIL/uL   Hemoglobin 12.2 12.0 - 15.0 g/dL   HCT 35.7 (L) 36.0 - 46.0 %   MCV 90.8 80.0 - 100.0 fL   MCH 31.0 26.0 - 34.0 pg   MCHC 34.2 30.0 - 36.0 g/dL   RDW 12.6 11.5  - 15.5 %   Platelets 238 150 - 400 K/uL   nRBC 0.0 0.0 - 0.2 %   Neutrophils Relative % 46 %  Neutro Abs 3.0 1.7 - 7.7 K/uL   Lymphocytes Relative 35 %   Lymphs Abs 2.3 0.7 - 4.0 K/uL   Monocytes Relative 14 %   Monocytes Absolute 0.9 0.1 - 1.0 K/uL   Eosinophils Relative 4 %   Eosinophils Absolute 0.3 0.0 - 0.5 K/uL   Basophils Relative 1 %   Basophils Absolute 0.0 0.0 - 0.1 K/uL   Immature Granulocytes 0 %   Abs Immature Granulocytes 0.01 0.00 - 0.07 K/uL    Comment: Performed at [facility] [address] [place], [place] [address]  Magnesium     Status: None   Collection Time: 12/10/21  2:27 AM  Result Value Ref Range   Magnesium 1.8 1.7 - 2.4 mg/dL    Comment: Performed at [facility] [address] [place], [place] [address]  Phosphorus     Status: None   Collection Time: 12/10/21  2:27 AM  Result Value Ref Range   Phosphorus 4.0 2.5 - 4.6 mg/dL    Comment: Performed at [facility] [address] [place], [place] [address]  Comprehensive metabolic panel     Status: Abnormal   Collection Time: 12/10/21  2:27 AM  Result Value Ref Range   Sodium 141 135 - 145 mmol/L   Potassium 4.0 3.5 - 5.1 mmol/L   Chloride 107 98 - 111 mmol/L   CO2 20 (L) 22 - 32 mmol/L   Glucose, Bld 126 (H) 70 - 99 mg/dL    Comment: Glucose reference range applies only to samples taken after fasting for at least 8 hours.   BUN 21 8 - 23 mg/dL   Creatinine, Ser 1.77 (H) 0.44 - 1.00 mg/dL   Calcium 9.4 8.9 - 10.3 mg/dL   Total Protein 6.3 (L) 6.5 - 8.1 g/dL   Albumin 3.5 3.5 - 5.0 g/dL   AST 54 (H) 15 - 41 U/L   ALT 58 (H) 0 - 44 U/L   Alkaline Phosphatase 36 (L) 38 - 126 U/L   Total Bilirubin 0.7 0.3 - 1.2 mg/dL   GFR, Estimated 31 (L) >60 mL/min    Comment: (NOTE) Calculated using the CKD-EPI Creatinine Equation (2021)    Anion gap 14 5 - 15    Comment: Performed at [facility], [place] [address] [place], [place] [address]  Glucose, capillary     Status:  Abnormal   Collection Time: 12/10/21  6:06 AM  Result Value Ref Range   Glucose-Capillary 118 (H) 70 - 99 mg/dL    Comment: Glucose reference range applies only to samples taken after fasting for at least 8 hours.  Glucose, capillary     Status: Abnormal   Collection Time: 12/10/21 12:14 PM  Result Value Ref Range   Glucose-Capillary 156 (H) 70 - 99 mg/dL    Comment: Glucose reference range applies only to samples taken after fasting for at least 8 hours.  Glucose, capillary     Status: Abnormal   Collection Time: 12/10/21  4:30 PM  Result Value Ref Range   Glucose-Capillary 131 (H) 70 - 99 mg/dL    Comment: Glucose reference range applies only to samples taken after fasting for at least 8 hours.  Glucose, capillary     Status: Abnormal   Collection Time: 12/10/21  9:05 PM  Result Value Ref Range   Glucose-Capillary 145 (H) 70 - 99 mg/dL    Comment: Glucose reference range applies only to samples taken after fasting for  at least 8 hours.   Comment 1 Notify RN    Comment 2 Document in Chart   CBC with Differential/Platelet     Status: Abnormal   Collection Time: 12/11/21  2:48 AM  Result Value Ref Range   WBC 10.7 (H) 4.0 - 10.5 K/uL   RBC 3.70 (L) 3.87 - 5.11 MIL/uL   Hemoglobin 11.6 (L) 12.0 - 15.0 g/dL   HCT 34.0 (L) 36.0 - 46.0 %   MCV 91.9 80.0 - 100.0 fL   MCH 31.4 26.0 - 34.0 pg   MCHC 34.1 30.0 - 36.0 g/dL   RDW 13.1 11.5 - 15.5 %   Platelets 236 150 - 400 K/uL   nRBC 0.0 0.0 - 0.2 %   Neutrophils Relative % 65 %   Neutro Abs 7.0 1.7 - 7.7 K/uL   Lymphocytes Relative 18 %   Lymphs Abs 1.9 0.7 - 4.0 K/uL   Monocytes Relative 16 %   Monocytes Absolute 1.7 (H) 0.1 - 1.0 K/uL   Eosinophils Relative 1 %   Eosinophils Absolute 0.1 0.0 - 0.5 K/uL   Basophils Relative 0 %   Basophils Absolute 0.0 0.0 - 0.1 K/uL   Immature Granulocytes 0 %   Abs Immature Granulocytes 0.02 0.00 - 0.07 K/uL    Comment: Performed at [facility], [address] [place], [place]  [address]  Magnesium     Status: None   Collection Time: 12/11/21  2:48 AM  Result Value Ref Range   Magnesium 1.8 1.7 - 2.4 mg/dL    Comment: Performed at [facility] [address], [place], [place] [address]  Phosphorus     Status: None   Collection Time: 12/11/21  2:48 AM  Result Value Ref Range   Phosphorus 3.6 2.5 - 4.6 mg/dL    Comment: Performed at [facility] [address], [place], [place] [address]  Comprehensive metabolic panel     Status: Abnormal   Collection Time: 12/11/21  2:48 AM  Result Value Ref Range   Sodium 139 135 - 145 mmol/L   Potassium 3.7 3.5 - 5.1 mmol/L   Chloride 108 98 - 111 mmol/L   CO2 24 22 - 32 mmol/L   Glucose, Bld 123 (H) 70 - 99 mg/dL    Comment: Glucose reference range applies only to samples taken after fasting for at least 8 hours.   BUN 15 8 - 23 mg/dL   Creatinine, Ser 1.45 (H) 0.44 - 1.00 mg/dL   Calcium 8.8 (L) 8.9 - 10.3 mg/dL   Total Protein 5.5 (L) 6.5 - 8.1 g/dL   Albumin 3.1 (L) 3.5 - 5.0 g/dL   AST 40 15 - 41 U/L   ALT 56 (H) 0 - 44 U/L   Alkaline Phosphatase 37 (L) 38 - 126 U/L   Total Bilirubin 0.8 0.3 - 1.2 mg/dL   GFR, Estimated 40 (L) >60 mL/min    Comment: (NOTE) Calculated using the CKD-EPI Creatinine Equation (2021)    Anion gap 7 5 - 15    Comment: Performed at [facility], [place] [address] [place], [place] [address]  Glucose, capillary     Status: Abnormal   Collection Time: 12/11/21  6:59 AM  Result Value Ref Range   Glucose-Capillary 133 (H) 70 - 99 mg/dL    Comment: Glucose reference range applies only to samples taken after fasting for at least 8 hours.    CT RENAL STONE STUDY  Result  Date: 12/10/2021 CLINICAL DATA:  Flank pain, kidney stones suspected EXAM: CT ABDOMEN AND PELVIS WITHOUT CONTRAST TECHNIQUE: Multidetector CT imaging of the abdomen and pelvis was performed following the standard protocol without IV contrast. RADIATION DOSE REDUCTION: This exam was performed according to the  departmental dose-optimization program which includes automated exposure control, adjustment of the mA and/or kV according to patient size and/or use of iterative reconstruction technique. COMPARISON:  12/06/2021 FINDINGS: Lower chest: New trace right pleural effusion.  Small hiatal hernia. Hepatobiliary: No solid liver abnormality is seen. Hepatic steatosis. No gallstones, gallbladder wall thickening, or biliary dilatation. Pancreas: Unremarkable. No pancreatic ductal dilatation or surrounding inflammatory changes. Spleen: Normal in size without significant abnormality. Adrenals/Urinary Tract: Adrenal glands are unremarkable. Unchanged 0.8 cm calculus at the left ureteropelvic junction with mild associated left hydronephrosis. Numerous additional bilateral renal calculi. No right-sided hydronephrosis. Bladder is unremarkable. Stomach/Bowel: Stomach is within normal limits. Appendix is surgically absent. No evidence of bowel wall thickening, distention, or inflammatory changes. Descending and sigmoid diverticulosis. Vascular/Lymphatic: No significant vascular findings are present. No enlarged abdominal or pelvic lymph nodes. Reproductive: No mass or other significant abnormality. Other: Small, fat containing midline ventral hernia (series 3, image 52). No ascites. Musculoskeletal: No acute or significant osseous findings. IMPRESSION: 1. Unchanged 0.8 cm calculus at the left ureteropelvic junction with mild associated left hydronephrosis. 2. Numerous additional bilateral renal calculi. No right-sided hydronephrosis. 3. Hepatic steatosis. 4. New trace right pleural effusion. Electronically Signed   By: Delanna Ahmadi M.D.   On: 12/10/2021 21:30    Review of Systems  Constitutional:  Negative for chills and fever.  Gastrointestinal:  Positive for nausea.  Genitourinary:  Positive for flank pain.  All other systems reviewed and are negative.  Blood pressure 132/67, pulse 64, temperature 98.7 F (37.1 C),  temperature source Oral, resp. rate 18, height 5' 2"  (1.575 m), weight 89.3 kg, SpO2 97 %. Physical Exam Vitals reviewed.  Constitutional:      Comments: Husband at bedside, both very pleasant.   HENT:     Head: Normocephalic.     Mouth/Throat:     Mouth: Mucous membranes are moist.  Eyes:     Extraocular Movements: Extraocular movements intact.  Cardiovascular:     Rate and Rhythm: Normal rate.  Pulmonary:     Effort: Pulmonary effort is normal.  Abdominal:     Comments: Significant truncal obesity limits sensitivity of exam.   Genitourinary:    Comments: Mild left CVAT at present.  Neurological:     General: No focal deficit present.     Mental Status: She is alert.  Psychiatric:        Mood and Affect: Mood normal.     Assessment/Plan:   1 - Left Ureteral, Bilateral Renal Stones - pt now with refracotry sympotms, some mild GFR compromise. I feel most prudent means of management woiuld be ureteral stents today (bilateral) and then ureteroscopy in elective setting in few weeks with goal of stone free. Risks, benefits, alternatives, expected peri-op course discussed. Can likely DC tomorrow as long as uneventful recovery. She is in full agreement and wants to proceed.   Alexis Frock 12/11/2021, 9:49 AM

## 2021-12-11 NOTE — Anesthesia Procedure Notes (Signed)
Procedure Name: Intubation Date/Time: 12/11/2021 6:06 PM  Performed by: Darletta Moll, CRNAPre-anesthesia Checklist: Patient identified, Emergency Drugs available, Suction available and Patient being monitored Patient Re-evaluated:Patient Re-evaluated prior to induction Oxygen Delivery Method: Circle system utilized Preoxygenation: Pre-oxygenation with 100% oxygen Induction Type: IV induction, Rapid sequence and Cricoid Pressure applied Laryngoscope Size: Mac and 3 Tube type: Oral Tube size: 7.0 mm Number of attempts: 1 Airway Equipment and Method: Stylet and Oral airway Placement Confirmation: ETT inserted through vocal cords under direct vision, positive ETCO2 and breath sounds checked- equal and bilateral Secured at: 21 cm Tube secured with: Tape Dental Injury: Teeth and Oropharynx as per pre-operative assessment

## 2021-12-11 NOTE — Anesthesia Preprocedure Evaluation (Addendum)
Anesthesia Evaluation  Patient identified by MRN, date of birth, ID band Patient awake    Reviewed: Allergy & Precautions, NPO status , Patient's Chart, lab work & pertinent test results  Airway Mallampati: II  TM Distance: >3 FB     Dental   Pulmonary sleep apnea , COPD,    breath sounds clear to auscultation       Cardiovascular hypertension, + Past MI   Rhythm:Regular Rate:Normal     Neuro/Psych PSYCHIATRIC DISORDERS  Neuromuscular disease    GI/Hepatic Neg liver ROS, hiatal hernia, GERD  ,  Endo/Other  diabetes  Renal/GU Renal disease     Musculoskeletal  (+) Arthritis ,   Abdominal   Peds  Hematology   Anesthesia Other Findings   Reproductive/Obstetrics                            Anesthesia Physical Anesthesia Plan  ASA: 3  Anesthesia Plan: General   Post-op Pain Management:    Induction: Intravenous  PONV Risk Score and Plan: 3 and Ondansetron, Midazolam and Treatment may vary due to age or medical condition  Airway Management Planned: Oral ETT  Additional Equipment:   Intra-op Plan:   Post-operative Plan: Extubation in OR  Informed Consent: I have reviewed the patients History and Physical, chart, labs and discussed the procedure including the risks, benefits and alternatives for the proposed anesthesia with the patient or authorized representative who has indicated his/her understanding and acceptance.     Dental advisory given  Plan Discussed with: Anesthesiologist and CRNA  Anesthesia Plan Comments:        Anesthesia Quick Evaluation

## 2021-12-11 NOTE — Consult Note (Signed)
   [facility] Inpatient Consult   12/11/2021  Merary Garguilo 1955-07-08 [id number]  [facility] [ACO] Patient: Jade Perez PPO  Primary Care Provider:  Marco Collie, MD, with [facility]  Patient screened for progression meeting hospitalization to assess for potential [facility] service needs for post hospital transition.  Review of patient's medical record reveals patient is for a procedure today.  Met with the patient at the bedside and patient endorses PCP and receiving care management. States awaiting her procedure, denies any additional follow up needs at this time. Gave patient an appointment reminder card with a 24 hour nurse advise line number.  Patient verbalized understanding and no other needs noted.  Plan:  Continue to follow progress and disposition to assess for post hospital care management needs.  No needs additional anticipated at this time.  For questions contact:   Natividad Brood, RN BSN [facility] Liaison  [phone] business mobile phone Toll free office [phone]  Fax number: [phone] [email or web address]

## 2021-12-12 ENCOUNTER — Encounter (HOSPITAL_COMMUNITY): Payer: Self-pay | Admitting: Urology

## 2021-12-12 DIAGNOSIS — I214 Non-ST elevation (NSTEMI) myocardial infarction: Secondary | ICD-10-CM | POA: Diagnosis not present

## 2021-12-12 LAB — CBC WITH DIFFERENTIAL/PLATELET
Abs Immature Granulocytes: 0.03 10*3/uL (ref 0.00–0.07)
Basophils Absolute: 0 10*3/uL (ref 0.0–0.1)
Basophils Relative: 0 %
Eosinophils Absolute: 0 10*3/uL (ref 0.0–0.5)
Eosinophils Relative: 0 %
HCT: 38.2 % (ref 36.0–46.0)
Hemoglobin: 13.1 g/dL (ref 12.0–15.0)
Immature Granulocytes: 0 %
Lymphocytes Relative: 10 %
Lymphs Abs: 1 10*3/uL (ref 0.7–4.0)
MCH: 31.2 pg (ref 26.0–34.0)
MCHC: 34.3 g/dL (ref 30.0–36.0)
MCV: 91 fL (ref 80.0–100.0)
Monocytes Absolute: 0.9 10*3/uL (ref 0.1–1.0)
Monocytes Relative: 9 %
Neutro Abs: 7.9 10*3/uL — ABNORMAL HIGH (ref 1.7–7.7)
Neutrophils Relative %: 81 %
Platelets: 274 10*3/uL (ref 150–400)
RBC: 4.2 MIL/uL (ref 3.87–5.11)
RDW: 13.1 % (ref 11.5–15.5)
WBC: 9.9 10*3/uL (ref 4.0–10.5)
nRBC: 0 % (ref 0.0–0.2)

## 2021-12-12 LAB — GLUCOSE, CAPILLARY
Glucose-Capillary: 135 mg/dL — ABNORMAL HIGH (ref 70–99)
Glucose-Capillary: 155 mg/dL — ABNORMAL HIGH (ref 70–99)
Glucose-Capillary: 37 mg/dL — CL (ref 70–99)
Glucose-Capillary: 198 mg/dL — ABNORMAL HIGH (ref 70–99)
Glucose-Capillary: 228 mg/dL — ABNORMAL HIGH (ref 70–99)

## 2021-12-12 LAB — COMPREHENSIVE METABOLIC PANEL
ALT: 59 U/L — ABNORMAL HIGH (ref 0–44)
AST: 39 U/L (ref 15–41)
Albumin: 3.5 g/dL (ref 3.5–5.0)
Alkaline Phosphatase: 39 U/L (ref 38–126)
Anion gap: 14 (ref 5–15)
BUN: 11 mg/dL (ref 8–23)
CO2: 20 mmol/L — ABNORMAL LOW (ref 22–32)
Calcium: 9.2 mg/dL (ref 8.9–10.3)
Chloride: 105 mmol/L (ref 98–111)
Creatinine, Ser: 1.04 mg/dL — ABNORMAL HIGH (ref 0.44–1.00)
GFR, Estimated: 59 mL/min — ABNORMAL LOW (ref >60)
Glucose, Bld: 142 mg/dL — ABNORMAL HIGH (ref 70–99)
Potassium: 3.9 mmol/L (ref 3.5–5.1)
Sodium: 139 mmol/L (ref 135–145)
Total Bilirubin: 0.7 mg/dL (ref 0.3–1.2)
Total Protein: 6.6 g/dL (ref 6.5–8.1)

## 2021-12-12 LAB — MAGNESIUM: Magnesium: 1.7 mg/dL (ref 1.7–2.4)

## 2021-12-12 LAB — PHOSPHORUS: Phosphorus: 3 mg/dL (ref 2.5–4.6)

## 2021-12-12 MED ORDER — MENTHOL 3 MG MT LOZG
1.0000 | LOZENGE | OROMUCOSAL | Status: DC | PRN
Start: 2021-12-12 — End: 2021-12-15
  Administered 2021-12-12: 3 mg via ORAL
  Filled 2021-12-12: qty 9

## 2021-12-12 MED ORDER — SODIUM CHLORIDE 0.9 % IV SOLN
2.0000 g | Freq: Every day | INTRAVENOUS | Status: DC
  Administered 2021-12-12 – 2021-12-15 (×4): 2 g via INTRAVENOUS
  Filled 2021-12-12 (×4): qty 20

## 2021-12-12 MED ORDER — INSULIN ASPART 100 UNIT/ML IJ SOLN
0.0000 [IU] | Freq: Three times a day (TID) | INTRAMUSCULAR | Status: DC
  Administered 2021-12-12: 2 [IU] via SUBCUTANEOUS
  Administered 2021-12-13: 1 [IU] via SUBCUTANEOUS
  Administered 2021-12-13 (×2): 2 [IU] via SUBCUTANEOUS
  Administered 2021-12-14: 1 [IU] via SUBCUTANEOUS
  Administered 2021-12-14 (×2): 2 [IU] via SUBCUTANEOUS
  Administered 2021-12-15: 1 [IU] via SUBCUTANEOUS
  Administered 2021-12-15: 2 [IU] via SUBCUTANEOUS

## 2021-12-12 MED ORDER — MAGNESIUM SULFATE 2 GM/50ML IV SOLN
2.0000 g | Freq: Once | INTRAVENOUS | Status: AC
  Administered 2021-12-12: 2 g via INTRAVENOUS
  Filled 2021-12-12: qty 50

## 2021-12-12 MED ORDER — PHENAZOPYRIDINE HCL 100 MG PO TABS: 100.0000 mg | ORAL_TABLET | Freq: Three times a day (TID) | ORAL | Status: DC | PRN

## 2021-12-12 MED ORDER — LOSARTAN POTASSIUM 25 MG PO TABS
25.0000 mg | ORAL_TABLET | Freq: Every day | ORAL | Status: DC
  Administered 2021-12-12 – 2021-12-13 (×2): 25 mg via ORAL
  Filled 2021-12-12 (×2): qty 1

## 2021-12-12 MED ORDER — TAMSULOSIN HCL 0.4 MG PO CAPS
0.4000 mg | ORAL_CAPSULE | Freq: Every day | ORAL | Status: DC
  Administered 2021-12-12 – 2021-12-14 (×4): 0.4 mg via ORAL
  Filled 2021-12-12 (×4): qty 1

## 2021-12-12 MED ORDER — CEFAZOLIN SODIUM-DEXTROSE 2-3 GM-%(50ML) IV SOLR
INTRAVENOUS | Status: DC | PRN
  Administered 2021-12-11: 2 g via INTRAVENOUS

## 2021-12-12 MED ORDER — DEXTROSE 50 % IV SOLN
INTRAVENOUS | Status: AC
  Filled 2021-12-12: qty 50

## 2021-12-12 MED ORDER — FESOTERODINE FUMARATE ER 4 MG PO TB24
4.0000 mg | ORAL_TABLET | Freq: Every day | ORAL | Status: DC
  Administered 2021-12-12 – 2021-12-14 (×3): 4 mg via ORAL
  Filled 2021-12-12 (×4): qty 1

## 2021-12-12 NOTE — Progress Notes (Signed)
   12/12/21 1023  Mobility  Activity Ambulated independently in hallway  Level of Assistance Independent  Assistive Device None  Distance Ambulated (ft) 300 ft  Activity Response Tolerated well  $Mobility charge 1 Mobility   Mobility Specialist Progress Note  Pre-Mobility: 80 HR, 98% SpO2 During Mobility: 91 HR  Received pt in bed having no complaints and agreeable to mobility. Pt was asymptomatic throughout ambulation and returned to room w/o fault. Left in bathroom w/ call bell in reach and all needs met.   Lucious Groves Mobility Specialist

## 2021-12-12 NOTE — Progress Notes (Signed)
Inpatient Diabetes Program Recommendations  AACE/ADA: New Consensus Statement on Inpatient Glycemic Control (2015)  Target Ranges:  Prepandial:   less than 140 mg/dL      Peak postprandial:   less than 180 mg/dL (1-2 hours)      Critically ill patients:  140 - 180 mg/dL   Lab Results  Component Value Date   GLUCAP 155 (H) 12/12/2021   HGBA1C 6.9 (H) 12/06/2021    Review of Glycemic Control  Latest Reference Range & Units 12/11/21 21:26 12/12/21 06:10 12/12/21 11:09 12/12/21 11:14  Glucose-Capillary 70 - 99 mg/dL 259 (H) 563 (H) 37 (LL) 155 (H)   Diabetes history: DM 2 Outpatient Diabetes medications:  Farxiga 10 mg daily, Amaryl 0.5 mg bid, Ozempic 2 mg weekly Current orders for Inpatient glycemic control:  Novolog moderate tid with meals  Inpatient Diabetes Program Recommendations:    Please consider reducing Novolog to sensitive tid with meals.   Thanks,  Beryl Meager, RN, BC-ADM Inpatient Diabetes Coordinator Pager [phone]  (8a-5p)

## 2021-12-12 NOTE — Progress Notes (Signed)
PROGRESS NOTE    Jade Perez  QIH:[id number] DOB: 11/15/1955 DOA: 12/06/2021 PCP: Jade Greenspan, MD   Brief Narrative: 66 year old with past medical history significant for hyperlipidemia, hypertension, diabetes type 2, diabetes type 2 nephropathy presents complaining of flank pain, nausea vomiting, CT renal stone obtained showed left renal pelvis stone no hydro-.  She was also found to have elevation of troponin, started on heparin drip admitted to [facility] for cardiology evaluation.  He underwent cardiac catheterization 9/1; Right dominant coronary anatomy.  Widely patent coronaries without evidence of obstructive disease. Suggestion of mild mid anterior wall hypokinesis with LVEDP 18 mmHg.  Patient with persistent flank pain and nausea, repeated CT renal stone showed unchanged 0.8 cm calculus at the left ureteropelvic junction with mild associated left hydronephrosis.  Urology consulted formally, patient underwent cystoscopy with bilateral retrograde pyelogram interpretation and insertion of bilateral ureteral stent on 9/5.  Assessment & Plan:   Principal Problem:   NSTEMI (non-ST elevated myocardial infarction) (HCC) Active Problems:   Type 2 diabetes mellitus without complication, without long-term current use of insulin (HCC)   Hypomagnesemia   Bilateral renal stones   Obesity (BMI 30-39.9)   Controlled type 2 diabetes mellitus with hyperglycemia (HCC)  1-none STEMI, elevation  of troponin due to known ischemic house, likely Takotsubo cardiomyopathy. Patient will need MRI outpatient. Underwent cath; normal coronary arteries.  Does have wall motion abnormality on echo and cath suggestive of a stress cardiomyopathy.   2-Renal stone: Patient with persistent nausea and flank pain.  CT renal stone protocol repeated on 9/4: Unchanged 0.8 cm  calculus at the left ureteropelvic junction with mild associated left hydronephrosis.  Numerous additional bilateral renal calculi.  No  right-sided hydronephrosis. -Patient underwent cystoscopy and bilateral ureteral stent placement by Dr. Berneice Perez -start Jade Perez and Flomax.  PRN pyridium.   UTI, positive group B: Continue with ceftriaxone Unable to tolerate oral antibiotic  Leukocytosis: Continue with IV antibiotics.   DM type II hyperglycemia;  SSI  Obesity;  Needs life style modification.  Hypomagnesemia: Replace Hypokalemia:  replace  Prolonged QT: replete electrolytes.  Repeat EKG tomorrow   Estimated body mass index is 35.39 kg/m as calculated from the following:   Height as of this encounter: 5\' 2"  (1.575 m).   Weight as of this encounter: 87.8 kg.   DVT prophylaxis: SCD Code Status: Full Code  Family Communication: care discussed with patient.  Disposition Plan:  Status is: Inpatient Remains inpatient appropriate because: management of kidney stone.     Consultants:  Urology  Cardiology   Procedures:  Cath Cystoscopy stent placement.   Antimicrobials:    Subjective: She report nausea, some what improved now. Received zofran. She wont be able to tolerate oral antibiotics. Not feeling well, having spasm.    Objective: Vitals:   12/11/21 2010 12/11/21 2303 12/12/21 0100 12/12/21 0358  BP: (!) 184/87 (!) 183/86 (!) 172/77 (!) 162/88  Pulse: 65 79 74 80  Resp: 20  20 20   Temp: 98.6 F (37 C)  98.6 F (37 C) 98.7 F (37.1 C)  TempSrc: Oral  Oral Oral  SpO2: 93% 98% 94% 97%  Weight:   87.8 kg   Height:        Intake/Output Summary (Last 24 hours) at 12/12/2021 0807 Last data filed at 12/11/2021 2015 Gross per 24 hour  Intake 640 ml  Output 1150 ml  Net -510 ml   Filed Weights   12/11/21 0700 12/11/21 1711 12/12/21 0100  Weight: 89.3 kg  89.3 kg 87.8 kg    Examination:  General exam: Appears calm and comfortable  Respiratory system: Clear to auscultation. Respiratory effort normal. Cardiovascular system: S1 & S2 heard, RRR. No JVD, murmurs, rubs, gallops or clicks. No pedal  edema. Gastrointestinal system: Abdomen is nondistended, soft and nontender. No organomegaly or masses felt. Normal bowel sounds heard. Central nervous system: Alert and oriented. No focal neurological deficits. Extremities: Symmetric 5 x 5 power.   Data Reviewed: I have personally reviewed following labs and imaging studies  CBC: Recent Labs  Lab 12/07/21 0510 12/08/21 0314 12/09/21 0240 12/10/21 0227 12/11/21 0248  WBC 10.7* 6.6 7.7 6.6 10.7*  NEUTROABS 6.7 3.4 4.2 3.0 7.0  HGB 13.0 12.3 12.0 12.2 11.6*  HCT 38.9 36.5 36.0 35.7* 34.0*  MCV 91.7 91.9 92.1 90.8 91.9  PLT 285 247 242 238 [id number]   Basic Metabolic Panel: Recent Labs  Lab 12/07/21 0510 12/08/21 0314 12/09/21 0240 12/10/21 0227 12/11/21 0248  NA 140 141 138 141 139  K 3.6 4.0 3.5 4.0 3.7  CL 110 108 108 107 108  CO2 22 25 22  20* 24  GLUCOSE 172* 132* 148* 126* 123*  BUN 12 14 16 21 15   CREATININE 1.01* 1.68* 1.87* 1.77* 1.45*  CALCIUM 8.7* 8.9 8.7* 9.4 8.8*  MG 1.9 1.8 1.7 1.8 1.8  PHOS  --   --  3.9 4.0 3.6   GFR: Estimated Creatinine Clearance: 38.8 mL/min (A) (by C-G formula based on SCr of 1.45 mg/dL (H)). Liver Function Tests: Recent Labs  Lab 12/06/21 0834 12/09/21 0240 12/10/21 0227 12/11/21 0248  AST 36 42* 54* 40  ALT 30 28 58* 56*  ALKPHOS 38 30* 36* 37*  BILITOT 0.9 1.1 0.7 0.8  PROT 7.8 5.6* 6.3* 5.5*  ALBUMIN 4.5 3.2* 3.5 3.1*   Recent Labs  Lab 12/06/21 0834  LIPASE 42   No results for input(s): "AMMONIA" in the last 168 hours. Coagulation Profile: No results for input(s): "INR", "PROTIME" in the last 168 hours. Cardiac Enzymes: No results for input(s): "CKTOTAL", "CKMB", "CKMBINDEX", "TROPONINI" in the last 168 hours. BNP (last 3 results) No results for input(s): "PROBNP" in the last 8760 hours. HbA1C: No results for input(s): "HGBA1C" in the last 72 hours. CBG: Recent Labs  Lab 12/11/21 1118 12/11/21 1634 12/11/21 1832 12/11/21 2126 12/12/21 0610  GLUCAP 170* 85  101* 164* 135*   Lipid Profile: No results for input(s): "CHOL", "HDL", "LDLCALC", "TRIG", "CHOLHDL", "LDLDIRECT" in the last 72 hours. Thyroid Function Tests: No results for input(s): "TSH", "T4TOTAL", "FREET4", "T3FREE", "THYROIDAB" in the last 72 hours. Anemia Panel: No results for input(s): "VITAMINB12", "FOLATE", "FERRITIN", "TIBC", "IRON", "RETICCTPCT" in the last 72 hours. Sepsis Labs: Recent Labs  Lab 12/06/21 [id number] 12/06/21 1053  LATICACIDVEN 3.5* 2.6*    Recent Results (from the past 240 hour(s))  Urine Culture     Status: Abnormal   Collection Time: 12/06/21  4:49 PM   Specimen: Urine, Clean Catch  Result Value Ref Range Status   Specimen Description URINE, CLEAN CATCH  Final   Special Requests NONE  Final   Culture (A)  Final    30,000 COLONIES/mL GROUP B STREP(S.AGALACTIAE)ISOLATED TESTING AGAINST S. AGALACTIAE NOT ROUTINELY PERFORMED DUE TO PREDICTABILITY OF AMP/PEN/VAN SUSCEPTIBILITY. Performed at [facility], [place] [address], [place], [place] [address]    Report Status 12/08/2021 FINAL  Final  Culture, blood (Routine X 2) w Reflex to ID Panel     Status: None   Collection Time:  12/06/21  6:00 PM   Specimen: BLOOD LEFT WRIST  Result Value Ref Range Status   Specimen Description BLOOD LEFT WRIST  Final   Special Requests   Final    BOTTLES DRAWN AEROBIC AND ANAEROBIC Blood Culture results may not be optimal due to an inadequate volume of blood received in culture bottles   Culture   Final    NO GROWTH 5 DAYS Performed at [facility], [address] [place], [place] [address]    Report Status 12/11/2021 FINAL  Final  Culture, blood (Routine X 2) w Reflex to ID Panel     Status: None   Collection Time: 12/06/21  6:06 PM   Specimen: BLOOD LEFT HAND  Result Value Ref Range Status   Specimen Description BLOOD LEFT HAND  Final   Special Requests   Final    BOTTLES DRAWN AEROBIC ONLY Blood Culture adequate volume   Culture   Final    NO GROWTH 5  DAYS Performed at [facility], [address], [place], [place] [address]    Report Status 12/11/2021 FINAL  Final         Radiology Studies: DG C-Arm 1-60 Min-No Report  Result Date: 12/11/2021 Fluoroscopy was utilized by the requesting physician.  No radiographic interpretation.   CT RENAL STONE STUDY  Result Date: 12/10/2021 CLINICAL DATA:  Flank pain, kidney stones suspected EXAM: CT ABDOMEN AND PELVIS WITHOUT CONTRAST TECHNIQUE: Multidetector CT imaging of the abdomen and pelvis was performed following the standard protocol without IV contrast. RADIATION DOSE REDUCTION: This exam was performed according to the departmental dose-optimization program which includes automated exposure control, adjustment of the mA and/or kV according to patient size and/or use of iterative reconstruction technique. COMPARISON:  12/06/2021 FINDINGS: Lower chest: New trace right pleural effusion.  Small hiatal hernia. Hepatobiliary: No solid liver abnormality is seen. Hepatic steatosis. No gallstones, gallbladder wall thickening, or biliary dilatation. Pancreas: Unremarkable. No pancreatic ductal dilatation or surrounding inflammatory changes. Spleen: Normal in size without significant abnormality. Adrenals/Urinary Tract: Adrenal glands are unremarkable. Unchanged 0.8 cm calculus at the left ureteropelvic junction with mild associated left hydronephrosis. Numerous additional bilateral renal calculi. No right-sided hydronephrosis. Bladder is unremarkable. Stomach/Bowel: Stomach is within normal limits. Appendix is surgically absent. No evidence of bowel wall thickening, distention, or inflammatory changes. Descending and sigmoid diverticulosis. Vascular/Lymphatic: No significant vascular findings are present. No enlarged abdominal or pelvic lymph nodes. Reproductive: No mass or other significant abnormality. Other: Small, fat containing midline ventral hernia (series 3, image 52). No ascites. Musculoskeletal: No  acute or significant osseous findings. IMPRESSION: 1. Unchanged 0.8 cm calculus at the left ureteropelvic junction with mild associated left hydronephrosis. 2. Numerous additional bilateral renal calculi. No right-sided hydronephrosis. 3. Hepatic steatosis. 4. New trace right pleural effusion. Electronically Signed   By: Jearld Lesch M.D.   On: 12/10/2021 21:30        Scheduled Meds:  atorvastatin  40 mg Oral Daily   B-complex with vitamin C  1 tablet Oral Daily   cefadroxil  500 mg Oral BID   citalopram  40 mg Oral Daily   fenofibrate  160 mg Oral Daily   heparin  5,000 Units Subcutaneous Q8H   insulin aspart  0-15 Units Subcutaneous TID WC   loratadine  10 mg Oral Daily   montelukast  10 mg Oral QHS   pantoprazole  20 mg Oral BID   sodium chloride flush  3 mL Intravenous Q12H   Continuous  Infusions:  sodium chloride     chlorproMAZINE (THORAZINE) 12.5 mg in sodium chloride 0.9 % 25 mL IVPB 12.5 mg (12/12/21 0114)   lactated ringers 10 mL/hr at 12/11/21 1755     LOS: 6 days    Time spent: 35 minutes    Jade Gotham A Yarexi Pawlicki, MD [facility]   If 7PM-7AM, please contact night-coverage [email or web address]  12/12/2021, 8:07 AM

## 2021-12-12 NOTE — Progress Notes (Signed)
1 Day Post-Op Subjective: S/p bilateral ureteral stent placement yesterday. Had nausea overnight, suprapubic pressure and bladder spasms. Voiding spontaneously with hematuria. Not passing clots. Having some burning with voiding. Making adequate urine.  Objective: Vital signs in last 24 hours: Temp:  [97 F (36.1 C)-98.7 F (37.1 C)] 98.7 F (37.1 C) (09/06 0358) Pulse Rate:  [62-80] 80 (09/06 0358) Resp:  [14-21] 20 (09/06 0358) BP: (125-184)/(57-91) 162/88 (09/06 0358) SpO2:  [90 %-99 %] 97 % (09/06 0358) Weight:  [87.8 kg-89.3 kg] 87.8 kg (09/06 0100)  Intake/Output from previous day: 09/05 0701 - 09/06 0700 In: 640 [P.O.:240; I.V.:400] Out: 1150 [Urine:1150] Intake/Output this shift: No intake/output data recorded.  Physical Exam:  General: Alert and oriented CV: Regular rate Lungs: Normal work of breathing on room air Abdomen: Soft, nondistended, nontender  Lab Results: Recent Labs    12/10/21 0227 12/11/21 0248  HGB 12.2 11.6*  HCT 35.7* 34.0*   BMET Recent Labs    12/10/21 0227 12/11/21 0248  NA 141 139  K 4.0 3.7  CL 107 108  CO2 20* 24  GLUCOSE 126* 123*  BUN 21 15  CREATININE 1.77* 1.45*  CALCIUM 9.4 8.8*     Studies/Results: DG C-Arm 1-60 Min-No Report  Result Date: 12/11/2021 Fluoroscopy was utilized by the requesting physician.  No radiographic interpretation.   CT RENAL STONE STUDY  Result Date: 12/10/2021 CLINICAL DATA:  Flank pain, kidney stones suspected EXAM: CT ABDOMEN AND PELVIS WITHOUT CONTRAST TECHNIQUE: Multidetector CT imaging of the abdomen and pelvis was performed following the standard protocol without IV contrast. RADIATION DOSE REDUCTION: This exam was performed according to the departmental dose-optimization program which includes automated exposure control, adjustment of the mA and/or kV according to patient size and/or use of iterative reconstruction technique. COMPARISON:  12/06/2021 FINDINGS: Lower chest: New trace right  pleural effusion.  Small hiatal hernia. Hepatobiliary: No solid liver abnormality is seen. Hepatic steatosis. No gallstones, gallbladder wall thickening, or biliary dilatation. Pancreas: Unremarkable. No pancreatic ductal dilatation or surrounding inflammatory changes. Spleen: Normal in size without significant abnormality. Adrenals/Urinary Tract: Adrenal glands are unremarkable. Unchanged 0.8 cm calculus at the left ureteropelvic junction with mild associated left hydronephrosis. Numerous additional bilateral renal calculi. No right-sided hydronephrosis. Bladder is unremarkable. Stomach/Bowel: Stomach is within normal limits. Appendix is surgically absent. No evidence of bowel wall thickening, distention, or inflammatory changes. Descending and sigmoid diverticulosis. Vascular/Lymphatic: No significant vascular findings are present. No enlarged abdominal or pelvic lymph nodes. Reproductive: No mass or other significant abnormality. Other: Small, fat containing midline ventral hernia (series 3, image 52). No ascites. Musculoskeletal: No acute or significant osseous findings. IMPRESSION: 1. Unchanged 0.8 cm calculus at the left ureteropelvic junction with mild associated left hydronephrosis. 2. Numerous additional bilateral renal calculi. No right-sided hydronephrosis. 3. Hepatic steatosis. 4. New trace right pleural effusion. Electronically Signed   By: Jearld Lesch M.D.   On: 12/10/2021 21:30    Assessment/Plan: 66 year old female with hx of CAD, DM, left obstructing UPJ stone and non obstructing right renal stones.  Now s/p cystoscopy, bilateral ureteral stent placement on 12/11/2021.  - Recommend starting anticholinergic (ie vesicare) for bladder spasms and flomax for stent discomfort. - Can give pyridium to help with dysuria.  - Patient will ultimately follow up with [place] outpatient for bilateral ureteroscopic stone extraction. We will set this up for patient.   LOS: 6 days   Jillyn Ledger,  MD Resident Physician [facility] 12/12/2021, 7:06 AM

## 2021-12-13 ENCOUNTER — Inpatient Hospital Stay (HOSPITAL_COMMUNITY): Payer: Medicare PPO

## 2021-12-13 DIAGNOSIS — I214 Non-ST elevation (NSTEMI) myocardial infarction: Secondary | ICD-10-CM | POA: Diagnosis not present

## 2021-12-13 LAB — COMPREHENSIVE METABOLIC PANEL WITH GFR
ALT: 45 U/L — ABNORMAL HIGH (ref 0–44)
AST: 23 U/L (ref 15–41)
Albumin: 3.4 g/dL — ABNORMAL LOW (ref 3.5–5.0)
Alkaline Phosphatase: 35 U/L — ABNORMAL LOW (ref 38–126)
Anion gap: 11 (ref 5–15)
BUN: 11 mg/dL (ref 8–23)
CO2: 25 mmol/L (ref 22–32)
Calcium: 9 mg/dL (ref 8.9–10.3)
Chloride: 100 mmol/L (ref 98–111)
Creatinine, Ser: 0.96 mg/dL (ref 0.44–1.00)
GFR, Estimated: >60 mL/min (ref >60)
Glucose, Bld: 152 mg/dL — ABNORMAL HIGH (ref 70–99)
Potassium: 3.4 mmol/L — ABNORMAL LOW (ref 3.5–5.1)
Sodium: 136 mmol/L (ref 135–145)
Total Bilirubin: 0.6 mg/dL (ref 0.3–1.2)
Total Protein: 5.9 g/dL — ABNORMAL LOW (ref 6.5–8.1)

## 2021-12-13 LAB — CBC WITH DIFFERENTIAL/PLATELET
Abs Immature Granulocytes: 0.02 K/uL (ref 0.00–0.07)
Basophils Absolute: 0 K/uL (ref 0.0–0.1)
Basophils Relative: 1 %
Eosinophils Absolute: 0.1 K/uL (ref 0.0–0.5)
Eosinophils Relative: 1 %
HCT: 36.3 % (ref 36.0–46.0)
Hemoglobin: 12.4 g/dL (ref 12.0–15.0)
Immature Granulocytes: 0 %
Lymphocytes Relative: 20 %
Lymphs Abs: 1.5 K/uL (ref 0.7–4.0)
MCH: 31.2 pg (ref 26.0–34.0)
MCHC: 34.2 g/dL (ref 30.0–36.0)
MCV: 91.4 fL (ref 80.0–100.0)
Monocytes Absolute: 1.3 K/uL — ABNORMAL HIGH (ref 0.1–1.0)
Monocytes Relative: 17 %
Neutro Abs: 4.7 K/uL (ref 1.7–7.7)
Neutrophils Relative %: 61 %
Platelets: 276 K/uL (ref 150–400)
RBC: 3.97 MIL/uL (ref 3.87–5.11)
RDW: 13.2 % (ref 11.5–15.5)
WBC: 7.6 K/uL (ref 4.0–10.5)
nRBC: 0 % (ref 0.0–0.2)

## 2021-12-13 LAB — SARS CORONAVIRUS 2 BY RT PCR: SARS Coronavirus 2 by RT PCR: NEGATIVE

## 2021-12-13 LAB — GLUCOSE, CAPILLARY
Glucose-Capillary: 153 mg/dL — ABNORMAL HIGH (ref 70–99)
Glucose-Capillary: 156 mg/dL — ABNORMAL HIGH (ref 70–99)
Glucose-Capillary: 143 mg/dL — ABNORMAL HIGH (ref 70–99)

## 2021-12-13 LAB — CK: Total CK: 71 U/L (ref 38–234)

## 2021-12-13 LAB — PHOSPHORUS: Phosphorus: 3.2 mg/dL (ref 2.5–4.6)

## 2021-12-13 LAB — MAGNESIUM: Magnesium: 2 mg/dL (ref 1.7–2.4)

## 2021-12-13 MED ORDER — LOSARTAN POTASSIUM 25 MG PO TABS
25.0000 mg | ORAL_TABLET | Freq: Once | ORAL | Status: AC
  Administered 2021-12-13: 25 mg via ORAL

## 2021-12-13 MED ORDER — AMLODIPINE BESYLATE 5 MG PO TABS
5.0000 mg | ORAL_TABLET | Freq: Every day | ORAL | Status: DC
  Administered 2021-12-13 – 2021-12-15 (×3): 5 mg via ORAL
  Filled 2021-12-13 (×3): qty 1

## 2021-12-13 MED ORDER — POTASSIUM CHLORIDE 10 MEQ/100ML IV SOLN
10.0000 meq | INTRAVENOUS | Status: AC
  Administered 2021-12-13 (×3): 10 meq via INTRAVENOUS
  Filled 2021-12-13 (×3): qty 100

## 2021-12-13 MED ORDER — PANTOPRAZOLE SODIUM 40 MG PO TBEC
40.0000 mg | DELAYED_RELEASE_TABLET | Freq: Two times a day (BID) | ORAL | Status: DC
  Administered 2021-12-14 – 2021-12-15 (×3): 40 mg via ORAL
  Filled 2021-12-13 (×4): qty 1

## 2021-12-13 MED ORDER — LACTATED RINGERS IV SOLN: INTRAVENOUS | Status: AC

## 2021-12-13 MED ORDER — LOSARTAN POTASSIUM 50 MG PO TABS: 50.0000 mg | ORAL_TABLET | Freq: Every day | ORAL | Status: DC

## 2021-12-13 MED ORDER — POTASSIUM CHLORIDE CRYS ER 20 MEQ PO TBCR
40.0000 meq | EXTENDED_RELEASE_TABLET | Freq: Once | ORAL | Status: AC
  Administered 2021-12-13: 40 meq via ORAL
  Filled 2021-12-13: qty 2

## 2021-12-13 MED ORDER — CITALOPRAM HYDROBROMIDE 20 MG PO TABS
20.0000 mg | ORAL_TABLET | Freq: Every day | ORAL | Status: DC
  Administered 2021-12-14 – 2021-12-15 (×2): 20 mg via ORAL
  Filled 2021-12-13 (×2): qty 1

## 2021-12-13 MED ORDER — SIMETHICONE 80 MG PO CHEW
80.0000 mg | CHEWABLE_TABLET | Freq: Four times a day (QID) | ORAL | Status: DC | PRN
Start: 2021-12-13 — End: 2021-12-15
  Administered 2021-12-13: 80 mg via ORAL
  Filled 2021-12-13: qty 1

## 2021-12-13 MED ORDER — PANTOPRAZOLE SODIUM 20 MG PO TBEC
20.0000 mg | DELAYED_RELEASE_TABLET | Freq: Once | ORAL | Status: AC
Start: 2021-12-13 — End: 2021-12-13
  Administered 2021-12-13: 20 mg via ORAL
  Filled 2021-12-13: qty 1

## 2021-12-13 NOTE — Progress Notes (Signed)
PROGRESS NOTE    Jade Perez  FBP:[id number] DOB: 03-11-1956 DOA: 12/06/2021 PCP: Abner Greenspan, MD   Brief Narrative: 66 year old with past medical history significant for hyperlipidemia, hypertension, diabetes type 2, diabetes type 2 nephropathy presents complaining of flank pain, nausea vomiting, CT renal stone obtained showed left renal pelvis stone no hydro-.  She was also found to have elevation of troponin, started on heparin drip admitted to [facility] for cardiology evaluation.  He underwent cardiac catheterization 9/1; Right dominant coronary anatomy.  Widely patent coronaries without evidence of obstructive disease. Suggestion of mild mid anterior wall hypokinesis with LVEDP 18 mmHg.  Patient with persistent flank pain and nausea, repeated CT renal stone showed unchanged 0.8 cm calculus at the left ureteropelvic junction with mild associated left hydronephrosis.  Urology consulted formally, patient underwent cystoscopy with bilateral retrograde pyelogram interpretation and insertion of bilateral ureteral stent on 9/5.  Assessment & Plan:   Principal Problem:   NSTEMI (non-ST elevated myocardial infarction) (HCC) Active Problems:   Type 2 diabetes mellitus without complication, without long-term current use of insulin (HCC)   Hypomagnesemia   Bilateral renal stones   Obesity (BMI 30-39.9)   Controlled type 2 diabetes mellitus with hyperglycemia (HCC)  1-none STEMI, elevation  of troponin due to known ischemic house, likely Takotsubo cardiomyopathy. Patient will need MRI outpatient. Underwent cath; normal coronary arteries.  Does have wall motion abnormality on echo and cath suggestive of a stress cardiomyopathy.   2-Renal stone: Patient with persistent nausea and flank pain.  CT renal stone protocol repeated on 9/4: Unchanged 0.8 cm  calculus at the left ureteropelvic junction with mild associated left hydronephrosis.  Numerous additional bilateral renal calculi.  No  right-sided hydronephrosis. -Patient underwent cystoscopy and bilateral ureteral stent placement by Dr. Berneice Heinrich -Start ed Gala Murdoch and Flomax.  PRN pyridium.  Complain of right flank pain, Nausea, vomiting today. KUB obtain stent in place, stone adjacent to the proximal loop left urinary tract. Will inform urology patient symptoms. Dr Cardell Peach [facility]  recommend support care.    UTI, positive group B: Continue with ceftriaxone, day 7 Unable to tolerate oral antibiotic  Leukocytosis: Continue with IV antibiotics.  Resolved.   Nausea, Vomiting;  Continue with Zofran PRN.  KUB, negative for obstruction. Stent in place.  IV fluid for few hours.  Increase protonix to 40 mg BID.   DM type II hyperglycemia;  SSI  Obesity;  Needs life style modification.   Hypomagnesemia: Replace.  Hypokalemia:  Replete IV, unable to tolerates oral.   Prolonged QT: replete electrolytes. Decrease Celexa dose.  QT prolong.   Muscle pain; sinus congestion.  Muscle enzyme normal.  Covid negative.   Estimated body mass index is 35.14 kg/m as calculated from the following:   Height as of this encounter: 5\' 2"  (1.575 m).   Weight as of this encounter: 87.1 kg.   DVT prophylaxis: SCD Code Status: Full Code  Family Communication: care discussed with patient.  Disposition Plan:  Status is: Inpatient Remains inpatient appropriate because: management of kidney stone.     Consultants:  Urology  Cardiology   Procedures:  Cath Cystoscopy stent placement.   Antimicrobials:    Subjective: She is not feeling well today. She report neck pain, muscle pain arm and thigh.  She report some nasal congestion.  She has BM.  She vomited today and continue to have nausea.    Objective: Vitals:   12/13/21 0935 12/13/21 1342 12/13/21 1343 12/13/21 1400  BP:  (!) 158/65  Pulse: 66 76 76   Resp:  20    Temp:  98.2 F (36.8 C)  98.2 F (36.8 C)  TempSrc:  Oral    SpO2: 100% 99% 99%   Weight:       Height:        Intake/Output Summary (Last 24 hours) at 12/13/2021 1507 Last data filed at 12/13/2021 1257 Gross per 24 hour  Intake 325 ml  Output 1150 ml  Net -825 ml    Filed Weights   12/11/21 1711 12/12/21 0100 12/13/21 0400  Weight: 89.3 kg 87.8 kg 87.1 kg    Examination:  General exam: NAD Respiratory system: CTA Cardiovascular system: S,1 S  2 RRR Gastrointestinal system: BS present. Soft, nt Central nervous system: alert Extremities: no edema   Data Reviewed: I have personally reviewed following labs and imaging studies  CBC: Recent Labs  Lab 12/09/21 0240 12/10/21 0227 12/11/21 0248 12/12/21 0745 12/13/21 0320  WBC 7.7 6.6 10.7* 9.9 7.6  NEUTROABS 4.2 3.0 7.0 7.9* 4.7  HGB 12.0 12.2 11.6* 13.1 12.4  HCT 36.0 35.7* 34.0* 38.2 36.3  MCV 92.1 90.8 91.9 91.0 91.4  PLT 242 238 236 274 276    Basic Metabolic Panel: Recent Labs  Lab 12/09/21 0240 12/10/21 0227 12/11/21 0248 12/12/21 0745 12/13/21 0320  NA 138 141 139 139 136  K 3.5 4.0 3.7 3.9 3.4*  CL 108 107 108 105 100  CO2 22 20* 24 20* 25  GLUCOSE 148* 126* 123* 142* 152*  BUN 16 21 15 11 11   CREATININE 1.87* 1.77* 1.45* 1.04* 0.96  CALCIUM 8.7* 9.4 8.8* 9.2 9.0  MG 1.7 1.8 1.8 1.7 2.0  PHOS 3.9 4.0 3.6 3.0 3.2    GFR: Estimated Creatinine Clearance: 58.3 mL/min (by C-G formula based on SCr of 0.96 mg/dL). Liver Function Tests: Recent Labs  Lab 12/09/21 0240 12/10/21 0227 12/11/21 0248 12/12/21 0745 12/13/21 0320  AST 42* 54* 40 39 23  ALT 28 58* 56* 59* 45*  ALKPHOS 30* 36* 37* 39 35*  BILITOT 1.1 0.7 0.8 0.7 0.6  PROT 5.6* 6.3* 5.5* 6.6 5.9*  ALBUMIN 3.2* 3.5 3.1* 3.5 3.4*    No results for input(s): "LIPASE", "AMYLASE" in the last 168 hours.  No results for input(s): "AMMONIA" in the last 168 hours. Coagulation Profile: No results for input(s): "INR", "PROTIME" in the last 168 hours. Cardiac Enzymes: Recent Labs  Lab 12/13/21 1238  CKTOTAL 71   BNP (last 3  results) No results for input(s): "PROBNP" in the last 8760 hours. HbA1C: No results for input(s): "HGBA1C" in the last 72 hours. CBG: Recent Labs  Lab 12/12/21 1114 12/12/21 1601 12/12/21 2107 12/13/21 0644 12/13/21 1211  GLUCAP 155* 198* 228* 153* 156*    Lipid Profile: No results for input(s): "CHOL", "HDL", "LDLCALC", "TRIG", "CHOLHDL", "LDLDIRECT" in the last 72 hours. Thyroid Function Tests: No results for input(s): "TSH", "T4TOTAL", "FREET4", "T3FREE", "THYROIDAB" in the last 72 hours. Anemia Panel: No results for input(s): "VITAMINB12", "FOLATE", "FERRITIN", "TIBC", "IRON", "RETICCTPCT" in the last 72 hours. Sepsis Labs: No results for input(s): "PROCALCITON", "LATICACIDVEN" in the last 168 hours.   Recent Results (from the past 240 hour(s))  Urine Culture     Status: Abnormal   Collection Time: 12/06/21  4:49 PM   Specimen: Urine, Clean Catch  Result Value Ref Range Status   Specimen Description URINE, CLEAN CATCH  Final   Special Requests NONE  Final   Culture (A)  Final  30,000 COLONIES/mL GROUP B STREP(S.AGALACTIAE)ISOLATED TESTING AGAINST S. AGALACTIAE NOT ROUTINELY PERFORMED DUE TO PREDICTABILITY OF AMP/PEN/VAN SUSCEPTIBILITY. Performed at [facility], [address] [place], [place] [address]    Report Status 12/08/2021 FINAL  Final  Culture, blood (Routine X 2) w Reflex to ID Panel     Status: None   Collection Time: 12/06/21  6:00 PM   Specimen: BLOOD LEFT WRIST  Result Value Ref Range Status   Specimen Description BLOOD LEFT WRIST  Final   Special Requests   Final    BOTTLES DRAWN AEROBIC AND ANAEROBIC Blood Culture results may not be optimal due to an inadequate volume of blood received in culture bottles   Culture   Final    NO GROWTH 5 DAYS Performed at [facility], [address] [place], [place] [address]    Report Status 12/11/2021 FINAL  Final  Culture, blood (Routine X 2) w Reflex to ID Panel     Status: None   Collection  Time: 12/06/21  6:06 PM   Specimen: BLOOD LEFT HAND  Result Value Ref Range Status   Specimen Description BLOOD LEFT HAND  Final   Special Requests   Final    BOTTLES DRAWN AEROBIC ONLY Blood Culture adequate volume   Culture   Final    NO GROWTH 5 DAYS Performed at [facility], [address], [place], [place] [address]    Report Status 12/11/2021 FINAL  Final  SARS Coronavirus 2 by RT PCR (hospital order, performed in [facility] hospital lab) *cepheid single result test* Anterior Nasal Swab     Status: None   Collection Time: 12/13/21 12:26 PM   Specimen: Anterior Nasal Swab  Result Value Ref Range Status   SARS Coronavirus 2 by RT PCR NEGATIVE NEGATIVE Final    Comment: (NOTE) SARS-CoV-2 target nucleic acids are NOT DETECTED.  The SARS-CoV-2 RNA is generally detectable in upper and lower respiratory specimens during the acute phase of infection. The lowest concentration of SARS-CoV-2 viral copies this assay can detect is 250 copies / mL. A negative result does not preclude SARS-CoV-2 infection and should not be used as the sole basis for treatment or other patient management decisions.  A negative result may occur with improper specimen collection / handling, submission of specimen other than nasopharyngeal swab, presence of viral mutation(s) within the areas targeted by this assay, and inadequate number of viral copies (<250 copies / mL). A negative result must be combined with clinical observations, patient history, and epidemiological information.  Fact Sheet for Patients:   RoadLapTop.co.za  Fact Sheet for Healthcare Providers: http://kim-miller.com/  This test is not yet approved or  cleared by the [place] FDA and has been authorized for detection and/or diagnosis of SARS-CoV-2 by FDA under an Emergency Use Authorization (EUA).  This EUA will remain in effect (meaning this test can be used) for the duration of  the COVID-19 declaration under Section 564(b)(1) of the Act, 21 U.S.C. section 360bbb-3(b)(1), unless the authorization is terminated or revoked sooner.  Performed at [facility], [address] [place], [place] [address]          Radiology Studies: DG Abd 1 View  Result Date: 12/13/2021 CLINICAL DATA:  Vomiting, stents in place. EXAM: ABDOMEN - 1 VIEW COMPARISON:  CT of the abdomen and pelvis from September fourth of 2023 FINDINGS: Bowel gas pattern without signs of obstruction. Double-J ureteral stents are in place, proximal loops in the  area of the renal pelvis, distal loops in the area of the urinary bladder. A 6 mm calculus has likely been pushed retrograde during placement of double-J ureteral stents into the LEFT renal pelvis from the LEFT UPJ as it was position on the study of December 10, 2021. A large phlebolith projects to the LEFT of the stent in the pelvis. Small RIGHT-sided and LEFT-sided urinary calculi elsewhere in the kidneys are similar to previous imaging. On limited assessment there is no acute skeletal finding. IMPRESSION: Double-J ureteral stents in place. Calculus adjacent to the proximal loop of the LEFT urinary tract stent. No acute findings. Electronically Signed   By: Donzetta Kohut M.D.   On: 12/13/2021 14:29   DG C-Arm 1-60 Min-No Report  Result Date: 12/11/2021 Fluoroscopy was utilized by the requesting physician.  No radiographic interpretation.        Scheduled Meds:  amLODipine  5 mg Oral Daily   atorvastatin  40 mg Oral Daily   B-complex with vitamin C  1 tablet Oral Daily   [START ON 12/14/2021] citalopram  20 mg Oral Daily   fenofibrate  160 mg Oral Daily   fesoterodine  4 mg Oral Daily   heparin  5,000 Units Subcutaneous Q8H   insulin aspart  0-9 Units Subcutaneous TID WC   loratadine  10 mg Oral Daily   montelukast  10 mg Oral QHS   pantoprazole  40 mg Oral BID   sodium chloride flush  3 mL Intravenous Q12H   tamsulosin  0.4 mg Oral  QPC supper   Continuous Infusions:  sodium chloride     cefTRIAXone (ROCEPHIN)  IV 2 g (12/13/21 0929)   chlorproMAZINE (THORAZINE) 12.5 mg in sodium chloride 0.9 % 25 mL IVPB 12.5 mg (12/12/21 0114)   lactated ringers 50 mL/hr at 12/13/21 1310   potassium chloride 10 mEq (12/13/21 1425)     LOS: 7 days    Time spent: 35 minutes    Angelina Neece A Jamori Biggar, MD [facility]   If 7PM-7AM, please contact night-coverage [email or web address]  12/13/2021, 3:07 PM

## 2021-12-13 NOTE — Progress Notes (Signed)
Rounding Note    Patient Name: Jade Perez Date of Encounter: 12/13/2021  [facility] Cardiologist: Thomasene Ripple, DO   Subjective   Sitting up in bed, husband at bedside.   Inpatient Medications    Scheduled Meds:  atorvastatin  40 mg Oral Daily   B-complex with vitamin C  1 tablet Oral Daily   citalopram  40 mg Oral Daily   fenofibrate  160 mg Oral Daily   fesoterodine  4 mg Oral Daily   heparin  5,000 Units Subcutaneous Q8H   insulin aspart  0-9 Units Subcutaneous TID WC   loratadine  10 mg Oral Daily   losartan  25 mg Oral Daily   montelukast  10 mg Oral QHS   pantoprazole  20 mg Oral BID   sodium chloride flush  3 mL Intravenous Q12H   tamsulosin  0.4 mg Oral QPC supper   Continuous Infusions:  sodium chloride     cefTRIAXone (ROCEPHIN)  IV 2 g (12/13/21 0929)   chlorproMAZINE (THORAZINE) 12.5 mg in sodium chloride 0.9 % 25 mL IVPB 12.5 mg (12/12/21 0114)   lactated ringers 10 mL/hr at 12/11/21 1755   PRN Meds: sodium chloride, acetaminophen, albuterol, chlorproMAZINE (THORAZINE) 12.5 mg in sodium chloride 0.9 % 25 mL IVPB, menthol-cetylpyridinium, morphine injection, naLOXone (NARCAN)  injection, nitroGLYCERIN, ondansetron (ZOFRAN) IV, phenazopyridine, sodium chloride flush, trimethobenzamide   Vital Signs    Vitals:   12/13/21 0355 12/13/21 0400 12/13/21 0912 12/13/21 0935  BP: (!) 144/68  (!) 159/77   Pulse: 74   66  Resp: 20     Temp: 98 F (36.7 C)  98.1 F (36.7 C)   TempSrc: Oral  Oral   SpO2: 97%  (!) 10% 100%  Weight:  87.1 kg    Height:        Intake/Output Summary (Last 24 hours) at 12/13/2021 0937 Last data filed at 12/13/2021 0300 Gross per 24 hour  Intake 325 ml  Output 500 ml  Net -175 ml      12/13/2021    4:00 AM 12/12/2021    1:00 AM 12/11/2021    5:11 PM  Last 3 Weights  Weight (lbs) 192 lb 1.6 oz 193 lb 8 oz 196 lb 14.4 oz  Weight (kg) 87.136 kg 87.771 kg 89.313 kg      Telemetry    Sinus Rhythm - Personally  Reviewed  ECG    NSR, 67 bpm, QTC 478 - Personally Reviewed  Physical Exam   GEN: No acute distress.   Neck: No JVD Cardiac: RRR, no murmurs, rubs, or gallops.  Respiratory: Clear to auscultation bilaterally. GI: Soft, nontender, non-distended  MS: No edema; No deformity. Neuro:  Nonfocal  Psych: Normal affect   Labs    High Sensitivity Troponin:   Recent Labs  Lab 12/06/21 0849 12/06/21 1053 12/07/21 0624 12/08/21 1152  TROPONINIHS 1,124* 2,511* 3,305* 1,071*     Chemistry Recent Labs  Lab 12/11/21 0248 12/12/21 0745 12/13/21 0320  NA 139 139 136  K 3.7 3.9 3.4*  CL 108 105 100  CO2 24 20* 25  GLUCOSE 123* 142* 152*  BUN 15 11 11   CREATININE 1.45* 1.04* 0.96  CALCIUM 8.8* 9.2 9.0  MG 1.8 1.7 2.0  PROT 5.5* 6.6 5.9*  ALBUMIN 3.1* 3.5 3.4*  AST 40 39 23  ALT 56* 59* 45*  ALKPHOS 37* 39 35*  BILITOT 0.8 0.7 0.6  GFRNONAA 40* 59* >60  ANIONGAP 7 14 11  Lipids  Recent Labs  Lab 12/07/21 0510  CHOL 125  TRIG 138  HDL 38*  LDLCALC 59  CHOLHDL 3.3    Hematology Recent Labs  Lab 12/11/21 0248 12/12/21 0745 12/13/21 0320  WBC 10.7* 9.9 7.6  RBC 3.70* 4.20 3.97  HGB 11.6* 13.1 12.4  HCT 34.0* 38.2 36.3  MCV 91.9 91.0 91.4  MCH 31.4 31.2 31.2  MCHC 34.1 34.3 34.2  RDW 13.1 13.1 13.2  PLT 236 274 276   Thyroid No results for input(s): "TSH", "FREET4" in the last 168 hours.  BNPNo results for input(s): "BNP", "PROBNP" in the last 168 hours.  DDimer No results for input(s): "DDIMER" in the last 168 hours.   Radiology    DG C-Arm 1-60 Min-No Report  Result Date: 12/11/2021 Fluoroscopy was utilized by the requesting physician.  No radiographic interpretation.    Cardiac Studies   Cath: 12/07/21   CONCLUSIONS:   Right dominant coronary anatomy.  Widely patent coronaries without evidence of obstructive disease. Suggestion of mild mid anterior wall hypokinesis with LVEDP 18 mmHg.   RECOMMENDATIONS:   Clinical scenario suggests stress  cardiomyopathy/Takotsubo variant.  Rule out myocarditis.  Rule out [place].  Cardiac MRI can be helpful and further identifying etiology.   Echo: 12/07/21   IMPRESSIONS     1. Left ventricular ejection fraction, by estimation, is 50 to 55%. The  left ventricle has low normal function. The left ventricle demonstrates  regional wall motion abnormalities (see scoring diagram/findings for  description). Left ventricular diastolic   parameters are consistent with Grade I diastolic dysfunction (impaired  relaxation).   2. Right ventricular systolic function is normal. The right ventricular  size is normal. Tricuspid regurgitation signal is inadequate for assessing  PA pressure.   3. The mitral valve is normal in structure. Trivial mitral valve  regurgitation. No evidence of mitral stenosis.   4. The aortic valve is tricuspid. Aortic valve regurgitation is not  visualized. No aortic stenosis is present.   5. The inferior vena cava is normal in size with greater than 50%  respiratory variability, suggesting right atrial pressure of 3 mmHg.   FINDINGS   Left Ventricle: Left ventricular ejection fraction, by estimation, is 50  to 55%. The left ventricle has low normal function. The left ventricle  demonstrates regional wall motion abnormalities. The left ventricular  internal cavity size was normal in  size. There is no left ventricular hypertrophy. Left ventricular diastolic  parameters are consistent with Grade I diastolic dysfunction (impaired  relaxation).      LV Wall Scoring:  The antero-lateral wall, inferior wall, and posterior wall are  hypokinetic.   Right Ventricle: The right ventricular size is normal. No increase in  right ventricular wall thickness. Right ventricular systolic function is  normal. Tricuspid regurgitation signal is inadequate for assessing PA  pressure. The tricuspid regurgitant  velocity is 1.37 m/s, and with an assumed right atrial pressure of 3 mmHg,  the  estimated right ventricular systolic pressure is [id number].   Left Atrium: Left atrial size was normal in size.   Right Atrium: Right atrial size was normal in size.   Pericardium: There is no evidence of pericardial effusion.   Mitral Valve: The mitral valve is normal in structure. Trivial mitral  valve regurgitation. No evidence of mitral valve stenosis.   Tricuspid Valve: The tricuspid valve is normal in structure. Tricuspid  valve regurgitation is not demonstrated. No evidence of tricuspid  stenosis.   Aortic Valve: The  aortic valve is tricuspid. Aortic valve regurgitation is  not visualized. Aortic regurgitation PHT measures 718 msec. No aortic  stenosis is present. Aortic valve mean gradient measures 3.0 mmHg. Aortic  valve peak gradient measures 6.7  mmHg. Aortic valve area, by VTI measures 1.39 cm.   Pulmonic Valve: The pulmonic valve was not well visualized. Pulmonic valve  regurgitation is not visualized. No evidence of pulmonic stenosis.   Aorta: The aortic root is normal in size and structure.   Venous: The inferior vena cava is normal in size with greater than 50%  respiratory variability, suggesting right atrial pressure of 3 mmHg.   IAS/Shunts: The interatrial septum was not well visualized.   Patient Profile     66 y.o. female with PMH of anemia, asthma, DM, HLD, HTN, OSA not on CPAP, who was admitted yesterday with flank pain, N/V and found to have elevated troponin.  No past cardiac hx.  Assessment & Plan    Elevated troponin Demand ischemia -- hsTn peaked at 3305, underwent cardiac cath noted above with normal coronaries. Does have rWMA on echo/cath suggestive of stress cardiomyopathy. Has been recommended for outpatient cardiac MRI which is ordered, along with follow up   N/V Flank pain UTI Renal stone 0.8 cm/numerous bilateral calculi -- nausea significant improved, received IVFs -- s/p cystoscopy with bilateral ureteral stent placement 9/5 --  management per urology    HLD -- LDL 59, HDL 38 -- on statin   HTN -- blood pressures were labile. Resumed on losartan 25mg  daily yesterday, increase to 50mg  daily  -- add amlodipine 5mg  daily    DM -- Hgb A1c 6.9 -- has been on farxiga but would hold this in the setting of UTI with plans to resume as an outpatient  Prolonged QT -- noted >500 on admission, EKG this morning 478 -- discussed with pharmD, recs to reduce celexa to 20mg  daily     For questions or updates, please contact [facility] Please consult [email or web address] for contact info under        Signed, , NP  12/13/2021, 9:37 AM

## 2021-12-13 NOTE — Progress Notes (Signed)
2 Days Post-Op Subjective: No acute events overnight.  Stent discomfort improved with initiation of flomax and toviaz. Still having hematuria, urine is thin without clots.   Objective: Vital signs in last 24 hours: Temp:  [98 F (36.7 C)-98.5 F (36.9 C)] 98 F (36.7 C) (09/07 0355) Pulse Rate:  [72-74] 74 (09/07 0355) Resp:  [20] 20 (09/07 0355) BP: (143-144)/(66-68) 144/68 (09/07 0355) SpO2:  [97 %-98 %] 97 % (09/07 0355) Weight:  [87.1 kg] 87.1 kg (09/07 0400)  Intake/Output from previous day: 09/06 0701 - 09/07 0700 In: 325 [P.O.:200; IV Piggyback:125] Out: 500 [Urine:500] Intake/Output this shift: No intake/output data recorded.  Physical Exam:  General: Alert and oriented CV: Regular rate Lungs: Normal work of breathing on room air Abdomen: Soft, nondistended, nontender  Lab Results: Recent Labs    12/11/21 0248 12/12/21 0745 12/13/21 0320  HGB 11.6* 13.1 12.4  HCT 34.0* 38.2 36.3    BMET Recent Labs    12/12/21 0745 12/13/21 0320  NA 139 136  K 3.9 3.4*  CL 105 100  CO2 20* 25  GLUCOSE 142* 152*  BUN 11 11  CREATININE 1.04* 0.96  CALCIUM 9.2 9.0      Studies/Results: DG C-Arm 1-60 Min-No Report  Result Date: 12/11/2021 Fluoroscopy was utilized by the requesting physician.  No radiographic interpretation.    Assessment/Plan: 66 year old female with hx of CAD, DM, left obstructing UPJ stone and non obstructing right renal stones.  Now s/p cystoscopy, bilateral ureteral stent placement on 12/11/2021.  - Continue toviaz for bladder spasms and flomax for stent discomfort. - Can give pyridium to help with dysuria.  - Patient will follow up with [place] outpatient for bilateral ureteroscopic stone extraction. This has been requested.   LOS: 7 days   Jillyn Ledger, MD Resident Physician [facility] 12/13/2021, 7:23 AM

## 2021-12-13 NOTE — Plan of Care (Signed)
  Problem: Education: Goal: Knowledge of General Education information will improve Description: Including pain rating scale, medication(s)/side effects and non-pharmacologic comfort measures Outcome: Progressing   Problem: Nutritional: Goal: Progress toward achieving an optimal weight will improve Outcome: Progressing   Problem: Cardiovascular: Goal: Ability to achieve and maintain adequate cardiovascular perfusion will improve Outcome: Progressing

## 2021-12-13 NOTE — Anesthesia Postprocedure Evaluation (Signed)
Anesthesia Post Note  Patient: Jade Perez  Procedure(s) Performed: CYSTOSCOPY , BILATERAL RETROGRADE , BILATERAL STENT PLACEMENT (Bilateral)     Patient location during evaluation: PACU Anesthesia Type: General Level of consciousness: patient cooperative and awake Pain management: pain level controlled Vital Signs Assessment: post-procedure vital signs reviewed and stable Respiratory status: spontaneous breathing, nonlabored ventilation, respiratory function stable and patient connected to nasal cannula oxygen Cardiovascular status: blood pressure returned to baseline and stable Postop Assessment: no apparent nausea or vomiting Anesthetic complications: no   No notable events documented.                Shanetta Nicolls

## 2021-12-14 DIAGNOSIS — I214 Non-ST elevation (NSTEMI) myocardial infarction: Secondary | ICD-10-CM | POA: Diagnosis not present

## 2021-12-14 LAB — BASIC METABOLIC PANEL
Anion gap: 12 (ref 5–15)
BUN: 11 mg/dL (ref 8–23)
CO2: 22 mmol/L (ref 22–32)
Calcium: 9.5 mg/dL (ref 8.9–10.3)
Chloride: 104 mmol/L (ref 98–111)
Creatinine, Ser: 1.09 mg/dL — ABNORMAL HIGH (ref 0.44–1.00)
GFR, Estimated: 56 mL/min — ABNORMAL LOW (ref >60)
Glucose, Bld: 150 mg/dL — ABNORMAL HIGH (ref 70–99)
Potassium: 3.6 mmol/L (ref 3.5–5.1)
Sodium: 138 mmol/L (ref 135–145)

## 2021-12-14 LAB — CBC WITH DIFFERENTIAL/PLATELET
Abs Immature Granulocytes: 0.02 10*3/uL (ref 0.00–0.07)
Basophils Absolute: 0.1 10*3/uL (ref 0.0–0.1)
Basophils Relative: 1 %
Eosinophils Absolute: 0.1 10*3/uL (ref 0.0–0.5)
Eosinophils Relative: 1 %
HCT: 37.3 % (ref 36.0–46.0)
Hemoglobin: 12.5 g/dL (ref 12.0–15.0)
Immature Granulocytes: 0 %
Lymphocytes Relative: 22 %
Lymphs Abs: 1.7 10*3/uL (ref 0.7–4.0)
MCH: 31.1 pg (ref 26.0–34.0)
MCHC: 33.5 g/dL (ref 30.0–36.0)
MCV: 92.8 fL (ref 80.0–100.0)
Monocytes Absolute: 1.3 10*3/uL — ABNORMAL HIGH (ref 0.1–1.0)
Monocytes Relative: 16 %
Neutro Abs: 4.7 10*3/uL (ref 1.7–7.7)
Neutrophils Relative %: 60 %
Platelets: 283 10*3/uL (ref 150–400)
RBC: 4.02 MIL/uL (ref 3.87–5.11)
RDW: 13.4 % (ref 11.5–15.5)
WBC: 7.8 10*3/uL (ref 4.0–10.5)
nRBC: 0 % (ref 0.0–0.2)

## 2021-12-14 LAB — PHOSPHORUS: Phosphorus: 3.8 mg/dL (ref 2.5–4.6)

## 2021-12-14 LAB — GLUCOSE, CAPILLARY
Glucose-Capillary: 152 mg/dL — ABNORMAL HIGH (ref 70–99)
Glucose-Capillary: 198 mg/dL — ABNORMAL HIGH (ref 70–99)
Glucose-Capillary: 133 mg/dL — ABNORMAL HIGH (ref 70–99)
Glucose-Capillary: 146 mg/dL — ABNORMAL HIGH (ref 70–99)

## 2021-12-14 LAB — MAGNESIUM: Magnesium: 1.8 mg/dL (ref 1.7–2.4)

## 2021-12-14 MED ORDER — MAGNESIUM SULFATE 2 GM/50ML IV SOLN
2.0000 g | Freq: Once | INTRAVENOUS | Status: AC
Start: 2021-12-14 — End: 2021-12-14
  Administered 2021-12-14: 2 g via INTRAVENOUS

## 2021-12-14 NOTE — Progress Notes (Addendum)
3 Days Post-Op Subjective: Patient with acute right sided flank, abdominal pain and nausea yesterday. KUB with stents in appropriate positioning. She notes her symptoms resolved overnight with PRNs. Doing much better today. Tolerating regular diet.  Making adequate urine. IV Rocephin continued for UTI.  Objective: Vital signs in last 24 hours: Temp:  [97.7 F (36.5 C)-99.1 F (37.3 C)] 98.7 F (37.1 C) (09/08 1300) Pulse Rate:  [66-94] 76 (09/08 1300) Resp:  [16-20] 20 (09/08 1300) BP: (120-159)/(68-72) 120/68 (09/08 1300) SpO2:  [95 %-99 %] 99 % (09/08 1300) Weight:  [86.7 kg] 86.7 kg (09/08 0336)  Intake/Output from previous day: 09/07 0701 - 09/08 0700 In: 1164.4 [P.O.:480; I.V.:191.9; IV Piggyback:492.5] Out: 1850 [Urine:1850] Intake/Output this shift: Total I/O In: 240 [P.O.:240] Out: 400 [Urine:400]  Physical Exam:  General: Alert and oriented CV: Regular rate Lungs: Normal work of breathing on room air Abdomen: Soft, nondistended, nontender. GU: Voiding spontaneously. No CVA tenderness  Lab Results: Recent Labs    12/12/21 0745 12/13/21 0320 12/14/21 0602  HGB 13.1 12.4 12.5  HCT 38.2 36.3 37.3    BMET Recent Labs    12/13/21 0320 12/14/21 0602  NA 136 138  K 3.4* 3.6  CL 100 104  CO2 25 22  GLUCOSE 152* 150*  BUN 11 11  CREATININE 0.96 1.09*  CALCIUM 9.0 9.5      Studies/Results: DG Abd 1 View  Result Date: 12/13/2021 CLINICAL DATA:  Vomiting, stents in place. EXAM: ABDOMEN - 1 VIEW COMPARISON:  CT of the abdomen and pelvis from September fourth of 2023 FINDINGS: Bowel gas pattern without signs of obstruction. Double-J ureteral stents are in place, proximal loops in the area of the renal pelvis, distal loops in the area of the urinary bladder. A 6 mm calculus has likely been pushed retrograde during placement of double-J ureteral stents into the LEFT renal pelvis from the LEFT UPJ as it was position on the study of December 10, 2021. A large  phlebolith projects to the LEFT of the stent in the pelvis. Small RIGHT-sided and LEFT-sided urinary calculi elsewhere in the kidneys are similar to previous imaging. On limited assessment there is no acute skeletal finding. IMPRESSION: Double-J ureteral stents in place. Calculus adjacent to the proximal loop of the LEFT urinary tract stent. No acute findings. Electronically Signed   By: Donzetta Kohut M.D.   On: 12/13/2021 14:29    Assessment/Plan: 66 year old female with hx of CAD, DM, left obstructing UPJ stone and non obstructing right renal stones.  Now s/p cystoscopy, bilateral ureteral stent placement on 12/11/2021.  - Continue toviaz for bladder spasms and flomax for stent discomfort. - Can give pyridium to help with dysuria.  - For complicated UTI, she will need a total of 14 days of antibiotics.  - Patient will follow up with [place] outpatient for bilateral ureteroscopic stone extraction. This has been requested.   LOS: 8 days   Jillyn Ledger, MD Resident Physician [facility] 12/14/2021, 4:00 PM

## 2021-12-14 NOTE — Progress Notes (Signed)
PROGRESS NOTE    Jade Perez  TKZ:[id number] DOB: 1955-09-09 DOA: 12/06/2021 PCP: Abner Greenspan, MD   Brief Narrative: 66 year old with past medical history significant for hyperlipidemia, hypertension, diabetes type 2, diabetes type 2 nephropathy presents complaining of flank pain, nausea vomiting, CT renal stone obtained showed left renal pelvis stone no hydro-.  She was also found to have elevation of troponin, started on heparin drip admitted to [facility] for cardiology evaluation.  He underwent cardiac catheterization 9/1; Right dominant coronary anatomy.  Widely patent coronaries without evidence of obstructive disease. Suggestion of mild mid anterior wall hypokinesis with LVEDP 18 mmHg.  Patient with persistent flank pain and nausea, repeated CT renal stone showed unchanged 0.8 cm calculus at the left ureteropelvic junction with mild associated left hydronephrosis.  Urology consulted formally, patient underwent cystoscopy with bilateral retrograde pyelogram interpretation and insertion of bilateral ureteral stent on 9/5.  Assessment & Plan:   Principal Problem:   NSTEMI (non-ST elevated myocardial infarction) (HCC) Active Problems:   Type 2 diabetes mellitus without complication, without long-term current use of insulin (HCC)   Hypomagnesemia   Bilateral renal stones   Obesity (BMI 30-39.9)   Controlled type 2 diabetes mellitus with hyperglycemia (HCC)  1-none STEMI, elevation  of troponin due to known ischemic house, likely Takotsubo cardiomyopathy. Patient will need MRI outpatient. Underwent cath; normal coronary arteries.  Does have wall motion abnormality on echo and cath suggestive of a stress cardiomyopathy.   2-Renal stone: Patient with persistent nausea and flank pain.  CT renal stone protocol repeated on 9/4: Unchanged 0.8 cm  calculus at the left ureteropelvic junction with mild associated left hydronephrosis.  Numerous additional bilateral renal calculi.  No  right-sided hydronephrosis. -Patient underwent cystoscopy and bilateral ureteral stent placement by Dr. Berneice Heinrich -Start ed Gala Murdoch and Flomax.  PRN pyridium.  9/07: Complain of right flank pain, Nausea, vomiting. KUB stent in place, stone adjacent to the proximal loop left urinary tract. Informed  urology patient symptoms. Dr Cardell Peach [facility]  recommend support care.  She is feeling better today, denies pain, nausea improved. Will try to eat breakfast.   UTI, positive group B: Continue with ceftriaxone, day 7 Plan to treat for 10 days.   Leukocytosis: Continue with IV antibiotics.  Resolved.   Nausea, Vomiting;  Continue with Zofran PRN.  KUB, negative for obstruction. Stent in place.  protonix to 40 mg BID.  Improved today.   DM type II hyperglycemia;  SSI  Obesity;  Needs life style modification.   Hypomagnesemia: Replace.  Hypokalemia: Replaced.   Prolonged QT: replete electrolytes. Decrease Celexa dose.  QT prolong.  EKG QT improved down to 499  Muscle pain; sinus congestion.  Muscle enzyme normal.  Covid negative.  Improved.   Estimated body mass index is 34.97 kg/m as calculated from the following:   Height as of this encounter: 5\' 2"  (1.575 m).   Weight as of this encounter: 86.7 kg.   DVT prophylaxis: SCD Code Status: Full Code  Family Communication: care discussed with patient.  Disposition Plan:  Status is: Inpatient Remains inpatient appropriate because: management of kidney stone.     Consultants:  Urology  Cardiology   Procedures:  Cath Cystoscopy stent placement.   Antimicrobials:    Subjective: She is feeling better today. Muscle pain improved.  Nausea improved. Flank pain better.  She will try to eat   Objective: Vitals:   12/13/21 1621 12/13/21 1952 12/14/21 0330 12/14/21 0336  BP: (!) 144/69 (!) 159/70 02/13/22)  143/72   Pulse: 84 94 66   Resp: 16 18 18    Temp: 99.1 F (37.3 C) 98.4 F (36.9 C) 97.7 F (36.5 C)   TempSrc: Oral Oral  Oral   SpO2: 95% 97% 99%   Weight:    86.7 kg  Height:        Intake/Output Summary (Last 24 hours) at 12/14/2021 1319 Last data filed at 12/14/2021 0600 Gross per 24 hour  Intake 1064.35 ml  Output 1200 ml  Net -135.65 ml    Filed Weights   12/12/21 0100 12/13/21 0400 12/14/21 0336  Weight: 87.8 kg 87.1 kg 86.7 kg    Examination:  General exam: NAD Respiratory system: CTA Cardiovascular system: S 1,, S 2 RRR Gastrointestinal system: BS present, soft, nt Central nervous system: Alert, follows command Extremities: No edema   Data Reviewed: I have personally reviewed following labs and imaging studies  CBC: Recent Labs  Lab 12/10/21 0227 12/11/21 0248 12/12/21 0745 12/13/21 0320 12/14/21 0602  WBC 6.6 10.7* 9.9 7.6 7.8  NEUTROABS 3.0 7.0 7.9* 4.7 4.7  HGB 12.2 11.6* 13.1 12.4 12.5  HCT 35.7* 34.0* 38.2 36.3 37.3  MCV 90.8 91.9 91.0 91.4 92.8  PLT 238 236 274 276 283    Basic Metabolic Panel: Recent Labs  Lab 12/10/21 0227 12/11/21 0248 12/12/21 0745 12/13/21 0320 12/14/21 0602  NA 141 139 139 136 138  K 4.0 3.7 3.9 3.4* 3.6  CL 107 108 105 100 104  CO2 20* 24 20* 25 22  GLUCOSE 126* 123* 142* 152* 150*  BUN 21 15 11 11 11   CREATININE 1.77* 1.45* 1.04* 0.96 1.09*  CALCIUM 9.4 8.8* 9.2 9.0 9.5  MG 1.8 1.8 1.7 2.0 1.8  PHOS 4.0 3.6 3.0 3.2 3.8    GFR: Estimated Creatinine Clearance: 51.2 mL/min (A) (by C-G formula based on SCr of 1.09 mg/dL (H)). Liver Function Tests: Recent Labs  Lab 12/09/21 0240 12/10/21 0227 12/11/21 0248 12/12/21 0745 12/13/21 0320  AST 42* 54* 40 39 23  ALT 28 58* 56* 59* 45*  ALKPHOS 30* 36* 37* 39 35*  BILITOT 1.1 0.7 0.8 0.7 0.6  PROT 5.6* 6.3* 5.5* 6.6 5.9*  ALBUMIN 3.2* 3.5 3.1* 3.5 3.4*    No results for input(s): "LIPASE", "AMYLASE" in the last 168 hours.  No results for input(s): "AMMONIA" in the last 168 hours. Coagulation Profile: No results for input(s): "INR", "PROTIME" in the last 168 hours. Cardiac  Enzymes: Recent Labs  Lab 12/13/21 1238  CKTOTAL 71    BNP (last 3 results) No results for input(s): "PROBNP" in the last 8760 hours. HbA1C: No results for input(s): "HGBA1C" in the last 72 hours. CBG: Recent Labs  Lab 12/13/21 0644 12/13/21 1211 12/13/21 1706 12/14/21 0558 12/14/21 1116  GLUCAP 153* 156* 143* 152* 198*    Lipid Profile: No results for input(s): "CHOL", "HDL", "LDLCALC", "TRIG", "CHOLHDL", "LDLDIRECT" in the last 72 hours. Thyroid Function Tests: No results for input(s): "TSH", "T4TOTAL", "FREET4", "T3FREE", "THYROIDAB" in the last 72 hours. Anemia Panel: No results for input(s): "VITAMINB12", "FOLATE", "FERRITIN", "TIBC", "IRON", "RETICCTPCT" in the last 72 hours. Sepsis Labs: No results for input(s): "PROCALCITON", "LATICACIDVEN" in the last 168 hours.   Recent Results (from the past 240 hour(s))  Urine Culture     Status: Abnormal   Collection Time: 12/06/21  4:49 PM   Specimen: Urine, Clean Catch  Result Value Ref Range Status   Specimen Description URINE, CLEAN CATCH  Final   Special Requests  NONE  Final   Culture (A)  Final    30,000 COLONIES/mL GROUP B STREP(S.AGALACTIAE)ISOLATED TESTING AGAINST S. AGALACTIAE NOT ROUTINELY PERFORMED DUE TO PREDICTABILITY OF AMP/PEN/VAN SUSCEPTIBILITY. Performed at [facility], [address], [place], [place] [address]    Report Status 12/08/2021 FINAL  Final  Culture, blood (Routine X 2) w Reflex to ID Panel     Status: None   Collection Time: 12/06/21  6:00 PM   Specimen: BLOOD LEFT WRIST  Result Value Ref Range Status   Specimen Description BLOOD LEFT WRIST  Final   Special Requests   Final    BOTTLES DRAWN AEROBIC AND ANAEROBIC Blood Culture results may not be optimal due to an inadequate volume of blood received in culture bottles   Culture   Final    NO GROWTH 5 DAYS Performed at [facility], [address] [place], [place] [address]    Report Status 12/11/2021 FINAL  Final   Culture, blood (Routine X 2) w Reflex to ID Panel     Status: None   Collection Time: 12/06/21  6:06 PM   Specimen: BLOOD LEFT HAND  Result Value Ref Range Status   Specimen Description BLOOD LEFT HAND  Final   Special Requests   Final    BOTTLES DRAWN AEROBIC ONLY Blood Culture adequate volume   Culture   Final    NO GROWTH 5 DAYS Performed at [facility], [address], [place], [place] [address]    Report Status 12/11/2021 FINAL  Final  SARS Coronavirus 2 by RT PCR (hospital order, performed in [facility] hospital lab) *cepheid single result test* Anterior Nasal Swab     Status: None   Collection Time: 12/13/21 12:26 PM   Specimen: Anterior Nasal Swab  Result Value Ref Range Status   SARS Coronavirus 2 by RT PCR NEGATIVE NEGATIVE Final    Comment: (NOTE) SARS-CoV-2 target nucleic acids are NOT DETECTED.  The SARS-CoV-2 RNA is generally detectable in upper and lower respiratory specimens during the acute phase of infection. The lowest concentration of SARS-CoV-2 viral copies this assay can detect is 250 copies / mL. A negative result does not preclude SARS-CoV-2 infection and should not be used as the sole basis for treatment or other patient management decisions.  A negative result may occur with improper specimen collection / handling, submission of specimen other than nasopharyngeal swab, presence of viral mutation(s) within the areas targeted by this assay, and inadequate number of viral copies (<250 copies / mL). A negative result must be combined with clinical observations, patient history, and epidemiological information.  Fact Sheet for Patients:   RoadLapTop.co.za  Fact Sheet for Healthcare Providers: http://kim-miller.com/  This test is not yet approved or  cleared by the [place] FDA and has been authorized for detection and/or diagnosis of SARS-CoV-2 by FDA under an Emergency Use Authorization (EUA).   This EUA will remain in effect (meaning this test can be used) for the duration of the COVID-19 declaration under Section 564(b)(1) of the Act, 21 U.S.C. section 360bbb-3(b)(1), unless the authorization is terminated or revoked sooner.  Performed at [facility], [address] [place], [place] [address]          Radiology Studies: DG Abd 1 View  Result Date: 12/13/2021 CLINICAL DATA:  Vomiting, stents in place. EXAM: ABDOMEN - 1 VIEW COMPARISON:  CT of the abdomen and pelvis from September fourth of 2023 FINDINGS: Bowel gas pattern without signs  of obstruction. Double-J ureteral stents are in place, proximal loops in the area of the renal pelvis, distal loops in the area of the urinary bladder. A 6 mm calculus has likely been pushed retrograde during placement of double-J ureteral stents into the LEFT renal pelvis from the LEFT UPJ as it was position on the study of December 10, 2021. A large phlebolith projects to the LEFT of the stent in the pelvis. Small RIGHT-sided and LEFT-sided urinary calculi elsewhere in the kidneys are similar to previous imaging. On limited assessment there is no acute skeletal finding. IMPRESSION: Double-J ureteral stents in place. Calculus adjacent to the proximal loop of the LEFT urinary tract stent. No acute findings. Electronically Signed   By: Donzetta Kohut M.D.   On: 12/13/2021 14:29        Scheduled Meds:  amLODipine  5 mg Oral Daily   atorvastatin  40 mg Oral Daily   B-complex with vitamin C  1 tablet Oral Daily   citalopram  20 mg Oral Daily   fenofibrate  160 mg Oral Daily   fesoterodine  4 mg Oral Daily   heparin  5,000 Units Subcutaneous Q8H   insulin aspart  0-9 Units Subcutaneous TID WC   loratadine  10 mg Oral Daily   montelukast  10 mg Oral QHS   pantoprazole  40 mg Oral BID   sodium chloride flush  3 mL Intravenous Q12H   tamsulosin  0.4 mg Oral QPC supper   Continuous Infusions:  sodium chloride     cefTRIAXone (ROCEPHIN)   IV 2 g (12/14/21 1008)   chlorproMAZINE (THORAZINE) 12.5 mg in sodium chloride 0.9 % 25 mL IVPB 12.5 mg (12/12/21 0114)   magnesium sulfate bolus IVPB       LOS: 8 days    Time spent: 35 minutes    Jade Houseworth A Lennex Pietila, MD [facility]   If 7PM-7AM, please contact night-coverage [email or web address]  12/14/2021, 1:19 PM

## 2021-12-15 DIAGNOSIS — I214 Non-ST elevation (NSTEMI) myocardial infarction: Secondary | ICD-10-CM | POA: Diagnosis not present

## 2021-12-15 LAB — CBC WITH DIFFERENTIAL/PLATELET
Abs Immature Granulocytes: 0.01 10*3/uL (ref 0.00–0.07)
Basophils Absolute: 0.1 10*3/uL (ref 0.0–0.1)
Basophils Relative: 1 %
Eosinophils Absolute: 0.2 10*3/uL (ref 0.0–0.5)
Eosinophils Relative: 3 %
HCT: 35.3 % — ABNORMAL LOW (ref 36.0–46.0)
Hemoglobin: 11.9 g/dL — ABNORMAL LOW (ref 12.0–15.0)
Immature Granulocytes: 0 %
Lymphocytes Relative: 34 %
Lymphs Abs: 2.2 10*3/uL (ref 0.7–4.0)
MCH: 31.2 pg (ref 26.0–34.0)
MCHC: 33.7 g/dL (ref 30.0–36.0)
MCV: 92.4 fL (ref 80.0–100.0)
Monocytes Absolute: 1 10*3/uL (ref 0.1–1.0)
Monocytes Relative: 16 %
Neutro Abs: 3 10*3/uL (ref 1.7–7.7)
Neutrophils Relative %: 46 %
Platelets: 292 10*3/uL (ref 150–400)
RBC: 3.82 MIL/uL — ABNORMAL LOW (ref 3.87–5.11)
RDW: 13.4 % (ref 11.5–15.5)
WBC: 6.4 10*3/uL (ref 4.0–10.5)
nRBC: 0 % (ref 0.0–0.2)

## 2021-12-15 LAB — GLUCOSE, CAPILLARY
Glucose-Capillary: 141 mg/dL — ABNORMAL HIGH (ref 70–99)
Glucose-Capillary: 169 mg/dL — ABNORMAL HIGH (ref 70–99)

## 2021-12-15 LAB — PHOSPHORUS: Phosphorus: 4 mg/dL (ref 2.5–4.6)

## 2021-12-15 LAB — MAGNESIUM: Magnesium: 1.9 mg/dL (ref 1.7–2.4)

## 2021-12-15 MED ORDER — ONDANSETRON HCL 4 MG/2ML IJ SOLN: 4.0000 mg | Freq: Four times a day (QID) | INTRAMUSCULAR | 0 refills | Status: DC | PRN

## 2021-12-15 MED ORDER — AMLODIPINE BESYLATE 5 MG PO TABS: 5.0000 mg | ORAL_TABLET | Freq: Every day | ORAL | 2 refills | Status: AC

## 2021-12-15 MED ORDER — CEPHALEXIN 500 MG PO CAPS: 500.0000 mg | ORAL_CAPSULE | Freq: Four times a day (QID) | ORAL | 0 refills | Status: AC

## 2021-12-15 MED ORDER — PHENAZOPYRIDINE HCL 100 MG PO TABS: 100.0000 mg | ORAL_TABLET | Freq: Three times a day (TID) | ORAL | 0 refills | Status: DC | PRN

## 2021-12-15 MED ORDER — FESOTERODINE FUMARATE ER 4 MG PO TB24: 4.0000 mg | ORAL_TABLET | Freq: Every day | ORAL | 0 refills | Status: AC

## 2021-12-15 MED ORDER — TAMSULOSIN HCL 0.4 MG PO CAPS: 0.4000 mg | ORAL_CAPSULE | Freq: Every day | ORAL | 0 refills | Status: DC

## 2021-12-15 MED ORDER — ONDANSETRON HCL 4 MG PO TABS: 4.0000 mg | ORAL_TABLET | Freq: Every day | ORAL | 1 refills | Status: DC | PRN

## 2021-12-15 NOTE — Progress Notes (Signed)
Patient is alert and oriented x 4 with no complaints of pain. No signs nor symptoms of distress noted.  Palpable pulses bilaterally +2, Vital signs within normal range.

## 2021-12-15 NOTE — Progress Notes (Signed)
Pt alert and oriented and is stable for discharge. Education is completed. Plan of Care is adequate for discharge home. No complaints. Husband at the bedside and is transporting patient home.

## 2021-12-15 NOTE — Discharge Summary (Signed)
Physician Discharge Summary   Patient: Kentasia Vancil MRN: [id number] DOB: 1956/02/09  Admit date:     12/06/2021  Discharge date: 12/15/21  Discharge Physician: Elmarie Shiley   PCP: Marco Collie, MD   Recommendations at discharge:   Follow up with cardiology post hospital admission for takosubo cardiomyopathy Follow up with urology for further care of Kidney stone.   Discharge Diagnoses: Principal Problem:   NSTEMI (non-ST elevated myocardial infarction) ([place]) Active Problems:   Type 2 diabetes mellitus without complication, without long-term current use of insulin (HCC)   Hypomagnesemia   Bilateral renal stones   Obesity (BMI 30-39.9)   Controlled type 2 diabetes mellitus with hyperglycemia (HCC)  Resolved Problems:   * No resolved hospital problems. *  Hospital Course: 66 year old with past medical history significant for hyperlipidemia, hypertension, diabetes type 2, diabetes type 2 nephropathy presents complaining of flank pain, nausea vomiting, CT renal stone obtained showed left renal pelvis stone no hydro-.  She was also found to have elevation of troponin, started on heparin drip admitted to [facility] for cardiology evaluation.  He underwent cardiac catheterization 9/1; Right dominant coronary anatomy.  Widely patent coronaries without evidence of obstructive disease. Suggestion of mild mid anterior wall hypokinesis with LVEDP 18 mmHg.   Patient with persistent flank pain and nausea, repeated CT renal stone showed unchanged 0.8 cm calculus at the left ureteropelvic junction with mild associated left hydronephrosis.   Urology consulted formally, patient underwent cystoscopy with bilateral retrograde pyelogram interpretation and insertion of bilateral ureteral stent on 9/5.  Hospital course extended due to persistent nausea, vomiting, inability to tolerates oral intake. Subsequently patient improved post stent placement.   Assessment and Plan: 1-none STEMI,  elevation  of troponin due to known ischemic house, likely Takotsubo cardiomyopathy. Patient will need MRI outpatient. Underwent cath; normal coronary arteries.  Does have wall motion abnormality on echo and cath suggestive of a stress cardiomyopathy.     2-Renal stone: Patient with persistent nausea and flank pain.  CT renal stone protocol repeated on 9/4: Unchanged 0.8 cm  calculus at the left ureteropelvic junction with mild associated left hydronephrosis.  Numerous additional bilateral renal calculi.  No right-sided hydronephrosis. -Patient underwent cystoscopy and bilateral ureteral stent placement by Dr. Tresa Moore -Start ed Lisbeth Ply and Flomax.  PRN pyridium.  9/07: Complain of right flank pain, Nausea, vomiting. KUB stent in place, stone adjacent to the proximal loop left urinary tract. Informed  urology patient symptoms. Dr Abner Greenspan Urology  recommend support care.  No more nausea, tolerating diet.  Plan to discharge today on Keflex to complete 7 more days of antibiotics, total 14.   UTI, positive group B: Continue with ceftriaxone, day 7 Plan to treat for 14 days.    Leukocytosis: Continue with IV antibiotics.  Resolved.    Nausea, Vomiting;  Continue with Zofran PRN.  KUB, negative for obstruction. Stent in place.  protonix to 40 mg BID.  Tolerating diet.    DM type II hyperglycemia;  SSI   Obesity;  Needs life style modification.    Hypomagnesemia: Replace.   Hypokalemia: Replaced.    Prolonged QT: replete electrolytes. Decrease Celexa dose.  QT prolong.  EKG QT improved down to 470   Muscle pain; sinus congestion.  Muscle enzyme normal.  Covid negative.  Improved.    Obesity: Needs lifestyle modification.   Estimated body mass index is 34.97 kg/m as calculated from the following:   Height as of this encounter: 5\' 2"  (1.575 m).  Weight as of this encounter: 86.7 kg.        Consultants: Cardiology , Urology  Procedures performed: None Disposition:  Home Diet recommendation:  Discharge Diet Orders (From admission, onward)     Start     Ordered   12/15/21 0000  Diet - low sodium heart healthy        12/15/21 1144           Cardiac diet DISCHARGE MEDICATION: Allergies as of 12/15/2021       Reactions   Amoxicillin Hives   Did it involve swelling of the face/tongue/throat, SOB, or low BP? No Did it involve sudden or severe rash/hives, skin peeling, or any reaction on the inside of your mouth or nose? No Did you need to seek medical attention at a hospital or doctor's office? No When did it last happen?      15-20 years ago If all above answers are "NO", may proceed with cephalosporin use.   Aspirin Swelling   Bee Venom    Clarithromycin    Dog Epithelium Allergy Skin Test    Erythromycin Nausea Only, Other (See Comments)   Stomach pain, also   Ibuprofen Swelling   Molds & Smuts    Nsaids    Other Other (See Comments)   Feline dander   Oxycodone-acetaminophen    Other reaction(s): Not available Not allergic to acetaminophen, takes it at home.   Percocet [oxycodone-acetaminophen] Itching   Sulfa Antibiotics Hives   Latex Rash   Tape Rash, Other (See Comments)   Band-Aids, also = blisters        Medication List     STOP taking these medications    dapagliflozin propanediol 10 MG Tabs tablet Commonly known as: FARXIGA   loratadine 10 MG tablet Commonly known as: CLARITIN   losartan 25 MG tablet Commonly known as: COZAAR       TAKE these medications    Accu-Chek Aviva Plus test strip Generic drug: glucose blood 1 each daily.   acetaminophen 500 MG tablet Commonly known as: TYLENOL Take 500 mg by mouth every 6 (six) hours as needed for mild pain.   albuterol 108 (90 Base) MCG/ACT inhaler Commonly known as: ProAir HFA Can inhale two puffs every four to six hours as needed for cough or wheeze. What changed:  how much to take how to take this when to take this reasons to take this additional  instructions   amLODipine 5 MG tablet Commonly known as: NORVASC Take 1 tablet (5 mg total) by mouth daily. Start taking on: December 16, 2021   atorvastatin 40 MG tablet Commonly known as: LIPITOR Take 40 mg by mouth daily.   b complex vitamins tablet Take 1 tablet by mouth daily.   cephALEXin 500 MG capsule Commonly known as: KEFLEX Take 1 capsule (500 mg total) by mouth 4 (four) times daily for 7 days.   cetirizine 10 MG tablet Commonly known as: ZYRTEC Take 10 mg by mouth daily.   citalopram 40 MG tablet Commonly known as: CELEXA Take 40 mg by mouth daily.   diclofenac Sodium 1 % Gel Commonly known as: Voltaren Apply 4 g topically 4 (four) times daily. What changed:  when to take this reasons to take this   fenofibrate 145 MG tablet Commonly known as: TRICOR Take 145 mg by mouth daily.   fesoterodine 4 MG Tb24 tablet Commonly known as: TOVIAZ Take 1 tablet (4 mg total) by mouth daily.   FIBER PO Take 3 tablets  by mouth daily.   FISH OIL PO Take 1,000 mg by mouth 2 (two) times daily.   glimepiride 1 MG tablet Commonly known as: AMARYL Take 0.5 mg by mouth 2 (two) times daily.   montelukast 10 MG tablet Commonly known as: SINGULAIR TAKE 1 TABLET BY MOUTH EVERY DAY What changed: when to take this   multivitamin with minerals Tabs tablet Take 1 tablet by mouth daily.   omeprazole 40 MG capsule Commonly known as: PRILOSEC TAKE 1 CAPSULE BY MOUTH EVERY DAY IN THE MORNING BEFORE BREAKFAST What changed: See the new instructions.   ondansetron 4 MG tablet Commonly known as: Zofran Take 1 tablet (4 mg total) by mouth daily as needed for nausea or vomiting.   Ozempic (1 MG/DOSE) 4 MG/3ML Sopn Generic drug: Semaglutide (1 MG/DOSE) Inject 2 mg into the skin once a week. Tuesday   phenazopyridine 100 MG tablet Commonly known as: PYRIDIUM Take 1 tablet (100 mg total) by mouth 3 (three) times daily as needed (dysuria).   SLOW FE PO Take 1 tablet by  mouth daily.   tamsulosin 0.4 MG Caps capsule Commonly known as: FLOMAX Take 1 capsule (0.4 mg total) by mouth daily after supper.   triamcinolone 55 MCG/ACT Aero nasal inhaler Commonly known as: NASACORT Place 2 sprays into the nose daily as needed (allergies).   Vitamin D (Ergocalciferol) 1.25 MG (50000 UNIT) Caps capsule Commonly known as: DRISDOL Take 50,000 Units by mouth every 30 (thirty) days.        Follow-up Information     Tobb, Kardie, DO Follow up on 12/24/2021.   Specialty: Cardiology Why: with Angie 2:20 pm  this is her PA Contact information: [address] [place] [address] [phone]         Debroah Baller, MD Follow up in 1 week(s).   Specialty: Urology Why: for [facility] stone Contact information: [address] [place] [address] [phone]                Discharge Exam: Ceasar Mons Weights   12/13/21 0400 12/14/21 0336 12/15/21 0618  Weight: 87.1 kg 86.7 kg 87.1 kg   General; NAD  Condition at discharge: stable  The results of significant diagnostics from this hospitalization (including imaging, microbiology, ancillary and laboratory) are listed below for reference.   Imaging Studies: DG Abd 1 View  Result Date: 12/13/2021 CLINICAL DATA:  Vomiting, stents in place. EXAM: ABDOMEN - 1 VIEW COMPARISON:  CT of the abdomen and pelvis from September fourth of 2023 FINDINGS: Bowel gas pattern without signs of obstruction. Double-J ureteral stents are in place, proximal loops in the area of the renal pelvis, distal loops in the area of the urinary bladder. A 6 mm calculus has likely been pushed retrograde during placement of double-J ureteral stents into the LEFT renal pelvis from the LEFT UPJ as it was position on the study of December 10, 2021. A large phlebolith projects to the LEFT of the stent in the pelvis. Small RIGHT-sided and LEFT-sided urinary calculi elsewhere in the kidneys are similar to previous imaging. On  limited assessment there is no acute skeletal finding. IMPRESSION: Double-J ureteral stents in place. Calculus adjacent to the proximal loop of the LEFT urinary tract stent. No acute findings. Electronically Signed   By: Donzetta Kohut M.D.   On: 12/13/2021 14:29   DG C-Arm 1-60 Min-No Report  Result Date: 12/11/2021 Fluoroscopy was utilized by the requesting physician.  No radiographic interpretation.   CT  RENAL STONE STUDY  Result Date: 12/10/2021 CLINICAL DATA:  Flank pain, kidney stones suspected EXAM: CT ABDOMEN AND PELVIS WITHOUT CONTRAST TECHNIQUE: Multidetector CT imaging of the abdomen and pelvis was performed following the standard protocol without IV contrast. RADIATION DOSE REDUCTION: This exam was performed according to the departmental dose-optimization program which includes automated exposure control, adjustment of the mA and/or kV according to patient size and/or use of iterative reconstruction technique. COMPARISON:  12/06/2021 FINDINGS: Lower chest: New trace right pleural effusion.  Small hiatal hernia. Hepatobiliary: No solid liver abnormality is seen. Hepatic steatosis. No gallstones, gallbladder wall thickening, or biliary dilatation. Pancreas: Unremarkable. No pancreatic ductal dilatation or surrounding inflammatory changes. Spleen: Normal in size without significant abnormality. Adrenals/Urinary Tract: Adrenal glands are unremarkable. Unchanged 0.8 cm calculus at the left ureteropelvic junction with mild associated left hydronephrosis. Numerous additional bilateral renal calculi. No right-sided hydronephrosis. Bladder is unremarkable. Stomach/Bowel: Stomach is within normal limits. Appendix is surgically absent. No evidence of bowel wall thickening, distention, or inflammatory changes. Descending and sigmoid diverticulosis. Vascular/Lymphatic: No significant vascular findings are present. No enlarged abdominal or pelvic lymph nodes. Reproductive: No mass or other significant  abnormality. Other: Small, fat containing midline ventral hernia (series 3, image 52). No ascites. Musculoskeletal: No acute or significant osseous findings. IMPRESSION: 1. Unchanged 0.8 cm calculus at the left ureteropelvic junction with mild associated left hydronephrosis. 2. Numerous additional bilateral renal calculi. No right-sided hydronephrosis. 3. Hepatic steatosis. 4. New trace right pleural effusion. Electronically Signed   By: Delanna Ahmadi M.D.   On: 12/10/2021 21:30   ECHOCARDIOGRAM COMPLETE  Result Date: 12/07/2021    ECHOCARDIOGRAM REPORT   Patient Name:   TAHIRA RUSCH [facility] Date of Exam: 12/07/2021 Medical Rec #:  [id number]         Height:       62.0 in Accession #:    [id number]        Weight:       198.5 lb Date of Birth:  1955-06-25         BSA:          1.906 m Patient Age:    8 years          BP:           145/65 mmHg Patient Gender: F                 HR:           67 bpm. Exam Location:  Inpatient Procedure: 2D Echo and Intracardiac Opacification Agent Indications:    NSTEMI  History:        Patient has no prior history of Echocardiogram examinations.                 NSTEMI, Signs/Symptoms:Chest Pain; Risk Factors:Diabetes and                 Asthma.  Sonographer:    Harvie Junior Referring Phys: [id number] FANG XU  Sonographer Comments: Technically difficult study due to poor echo windows. IMPRESSIONS  1. Left ventricular ejection fraction, by estimation, is 50 to 55%. The left ventricle has low normal function. The left ventricle demonstrates regional wall motion abnormalities (see scoring diagram/findings for description). Left ventricular diastolic  parameters are consistent with Grade I diastolic dysfunction (impaired relaxation).  2. Right ventricular systolic function is normal. The right ventricular size is normal. Tricuspid regurgitation signal is inadequate for assessing PA pressure.  3. The mitral valve is normal in structure. Trivial mitral  valve regurgitation. No evidence of mitral  stenosis.  4. The aortic valve is tricuspid. Aortic valve regurgitation is not visualized. No aortic stenosis is present.  5. The inferior vena cava is normal in size with greater than 50% respiratory variability, suggesting right atrial pressure of 3 mmHg. FINDINGS  Left Ventricle: Left ventricular ejection fraction, by estimation, is 50 to 55%. The left ventricle has low normal function. The left ventricle demonstrates regional wall motion abnormalities. The left ventricular internal cavity size was normal in size. There is no left ventricular hypertrophy. Left ventricular diastolic parameters are consistent with Grade I diastolic dysfunction (impaired relaxation).  LV Wall Scoring: The antero-lateral wall, inferior wall, and posterior wall are hypokinetic. Right Ventricle: The right ventricular size is normal. No increase in right ventricular wall thickness. Right ventricular systolic function is normal. Tricuspid regurgitation signal is inadequate for assessing PA pressure. The tricuspid regurgitant velocity is 1.37 m/s, and with an assumed right atrial pressure of 3 mmHg, the estimated right ventricular systolic pressure is [id number]. Left Atrium: Left atrial size was normal in size. Right Atrium: Right atrial size was normal in size. Pericardium: There is no evidence of pericardial effusion. Mitral Valve: The mitral valve is normal in structure. Trivial mitral valve regurgitation. No evidence of mitral valve stenosis. Tricuspid Valve: The tricuspid valve is normal in structure. Tricuspid valve regurgitation is not demonstrated. No evidence of tricuspid stenosis. Aortic Valve: The aortic valve is tricuspid. Aortic valve regurgitation is not visualized. Aortic regurgitation PHT measures 718 msec. No aortic stenosis is present. Aortic valve mean gradient measures 3.0 mmHg. Aortic valve peak gradient measures 6.7 mmHg. Aortic valve area, by VTI measures 1.39 cm. Pulmonic Valve: The pulmonic valve was not well  visualized. Pulmonic valve regurgitation is not visualized. No evidence of pulmonic stenosis. Aorta: The aortic root is normal in size and structure. Venous: The inferior vena cava is normal in size with greater than 50% respiratory variability, suggesting right atrial pressure of 3 mmHg. IAS/Shunts: The interatrial septum was not well visualized.  LEFT VENTRICLE PLAX 2D LVIDd:         4.90 cm      Diastology LVIDs:         3.60 cm      LV e' medial:    5.87 cm/s LV PW:         1.00 cm      LV E/e' medial:  11.0 LV IVS:        0.80 cm      LV e' lateral:   8.81 cm/s LVOT diam:     1.80 cm      LV E/e' lateral: 7.3 LV SV:         36 LV SV Index:   19 LVOT Area:     2.54 cm  LV Volumes (MOD) LV vol d, MOD A2C: 104.8 ml LV vol d, MOD A4C: 118.0 ml LV vol s, MOD A2C: 48.6 ml LV vol s, MOD A4C: 58.4 ml LV SV MOD A2C:     56.2 ml LV SV MOD A4C:     118.0 ml LV SV MOD BP:      58.2 ml RIGHT VENTRICLE RV S prime:     15.10 cm/s TAPSE (M-mode): 2.0 cm LEFT ATRIUM             Index        RIGHT ATRIUM          Index LA diam:  3.00 cm 1.57 cm/m   RA Area:     7.49 cm LA Vol (A2C):   44.2 ml 23.20 ml/m  RA Volume:   12.70 ml 6.66 ml/m LA Vol (A4C):   32.5 ml 17.06 ml/m LA Biplane Vol: 39.4 ml 20.68 ml/m  AORTIC VALVE                    PULMONIC VALVE AV Area (Vmax):    1.50 cm     PV Vmax:       1.18 m/s AV Area (Vmean):   1.41 cm     PV Peak grad:  5.6 mmHg AV Area (VTI):     1.39 cm AV Vmax:           129.00 cm/s AV Vmean:          84.200 cm/s AV VTI:            0.260 m AV Peak Grad:      6.7 mmHg AV Mean Grad:      3.0 mmHg LVOT Vmax:         75.80 cm/s LVOT Vmean:        46.700 cm/s LVOT VTI:          0.142 m LVOT/AV VTI ratio: 0.55 AI PHT:            718 msec  AORTA Ao Root diam: 2.90 cm Ao Asc diam:  3.00 cm MITRAL VALVE               TRICUSPID VALVE MV Area (PHT): 3.53 cm    TR Peak grad:   7.5 mmHg MV Decel Time: 215 msec    TR Vmax:        137.00 cm/s MR Peak grad: 13.9 mmHg MR Vmax:      186.50 cm/s   SHUNTS MV E velocity: 64.30 cm/s  Systemic VTI:  0.14 m MV A velocity: 70.40 cm/s  Systemic Diam: 1.80 cm MV E/A ratio:  0.91 Cherlynn Kaiser MD Electronically signed by Cherlynn Kaiser MD Signature Date/Time: 12/07/2021/3:13:00 PM    Final    CARDIAC CATHETERIZATION  Result Date: 12/07/2021 CONCLUSIONS: Right dominant coronary anatomy.  Widely patent coronaries without evidence of obstructive disease. Suggestion of mild mid anterior wall hypokinesis with LVEDP 18 mmHg. 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  Rule out myocarditis.  Rule out [place].  Cardiac MRI can be helpful and further identifying etiology.   CT Renal Stone Study  Result Date: 12/06/2021 CLINICAL DATA:  Flank pain.  Kidney stones suspected. EXAM: CT ABDOMEN AND PELVIS WITHOUT CONTRAST TECHNIQUE: Multidetector CT imaging of the abdomen and pelvis was performed following the standard protocol without IV contrast. RADIATION DOSE REDUCTION: This exam was performed according to the departmental dose-optimization program which includes automated exposure control, adjustment of the mA and/or kV according to patient size and/or use of iterative reconstruction technique. COMPARISON:  None Available. FINDINGS: Lower chest: Unremarkable. Hepatobiliary: The liver shows diffusely decreased attenuation suggesting fat deposition. No suspicious focal abnormality in the liver on this study without intravenous contrast. There is no evidence for gallstones, gallbladder wall thickening, or pericholecystic fluid. No intrahepatic or extrahepatic biliary dilation. Pancreas: No focal mass lesion. No dilatation of the main duct. No intraparenchymal cyst. No peripancreatic edema. Spleen: No splenomegaly. No focal mass lesion. Adrenals/Urinary Tract: No adrenal nodule or mass. Fiber 6 nonobstructing stones are seen in the right kidney measuring up to 5 mm. No right ureteral stones. 7 rate  stones are seen in the left kidney  measuring up to about 3 mm. The patient also has a 7 x 6 x 8 mm stone in the left renal pelvis near the UPJ. Mild fullness noted left intrarenal collecting system without overt hydronephrosis. No left ureteral stone. No bladder stones. Stomach/Bowel: Tiny hiatal hernia. Stomach moderately distended with fluid. Duodenum is normally positioned as is the ligament of Treitz. No small bowel wall thickening. No small bowel dilatation. The terminal ileum is normal. Nonvisualization of the appendix is consistent with the reported history of appendectomy. A few scattered diverticuli are seen in the left colon without diverticulitis. Vascular/Lymphatic: No abdominal aortic aneurysm. No abdominal aortic atherosclerotic calcification. There is no gastrohepatic or hepatoduodenal ligament lymphadenopathy. No retroperitoneal or mesenteric lymphadenopathy. No pelvic sidewall lymphadenopathy. Reproductive: Unremarkable. Other: No intraperitoneal free fluid. Musculoskeletal: No worrisome lytic or sclerotic osseous abnormality. IMPRESSION: 1. 7 x 6 x 8 mm stone in the left renal pelvis near the UPJ with mild fullness of the left intrarenal collecting system but no overt hydronephrosis. 2. Bilateral nonobstructing renal stones. 3. Tiny hiatal hernia. 4. Hepatic steatosis. Electronically Signed   By: Misty Stanley M.D.   On: 12/06/2021 10:07   DG Chest 2 View  Result Date: 12/06/2021 CLINICAL DATA:  Chest and abdominal pain in a 66 year old female. EXAM: CHEST - 2 VIEW COMPARISON:  October 15, 2011. FINDINGS: EKG leads project over the chest. Cardiomediastinal contours and hilar structures are normal. Lungs are clear. No sign of pleural effusion. On limited assessment no acute skeletal findings. Mild curvature of the spine is similar with dextroconvex component in the thoracic spine. IMPRESSION: No acute cardiopulmonary disease. Electronically Signed   By: Zetta Bills M.D.   On: 12/06/2021 09:05    Microbiology: Results for orders  placed or performed during the hospital encounter of 12/06/21  Urine Culture     Status: Abnormal   Collection Time: 12/06/21  4:49 PM   Specimen: Urine, Clean Catch  Result Value Ref Range Status   Specimen Description URINE, CLEAN CATCH  Final   Special Requests NONE  Final   Culture (A)  Final    30,000 COLONIES/mL GROUP B STREP(S.AGALACTIAE)ISOLATED TESTING AGAINST S. AGALACTIAE NOT ROUTINELY PERFORMED DUE TO PREDICTABILITY OF AMP/PEN/VAN SUSCEPTIBILITY. Performed at [facility], [place] [address] [place], [place] [address]    Report Status 12/08/2021 FINAL  Final  Culture, blood (Routine X 2) w Reflex to ID Panel     Status: None   Collection Time: 12/06/21  6:00 PM   Specimen: BLOOD LEFT WRIST  Result Value Ref Range Status   Specimen Description BLOOD LEFT WRIST  Final   Special Requests   Final    BOTTLES DRAWN AEROBIC AND ANAEROBIC Blood Culture results may not be optimal due to an inadequate volume of blood received in culture bottles   Culture   Final    NO GROWTH 5 DAYS Performed at [facility], [place] [address], [place],  [address]    Report Status 12/11/2021 FINAL  Final  Culture, blood (Routine X 2) w Reflex to ID Panel     Status: None   Collection Time: 12/06/21  6:06 PM   Specimen: BLOOD LEFT HAND  Result Value Ref Range Status   Specimen Description BLOOD LEFT HAND  Final   Special Requests   Final    BOTTLES DRAWN AEROBIC ONLY Blood Culture adequate volume   Culture   Final    NO GROWTH 5  DAYS Performed at [facility], [address], [place], [place] [address]    Report Status 12/11/2021 FINAL  Final  SARS Coronavirus 2 by RT PCR (hospital order, performed in [facility] hospital lab) *cepheid single result test* Anterior Nasal Swab     Status: None   Collection Time: 12/13/21 12:26 PM   Specimen: Anterior Nasal Swab  Result Value Ref Range Status   SARS Coronavirus 2 by RT PCR NEGATIVE NEGATIVE Final    Comment:  (NOTE) SARS-CoV-2 target nucleic acids are NOT DETECTED.  The SARS-CoV-2 RNA is generally detectable in upper and lower respiratory specimens during the acute phase of infection. The lowest concentration of SARS-CoV-2 viral copies this assay can detect is 250 copies / mL. A negative result does not preclude SARS-CoV-2 infection and should not be used as the sole basis for treatment or other patient management decisions.  A negative result may occur with improper specimen collection / handling, submission of specimen other than nasopharyngeal swab, presence of viral mutation(s) within the areas targeted by this assay, and inadequate number of viral copies (<250 copies / mL). A negative result must be combined with clinical observations, patient history, and epidemiological information.  Fact Sheet for Patients:   RoadLapTop.co.za  Fact Sheet for Healthcare Providers: http://kim-miller.com/  This test is not yet approved or  cleared by the [place] FDA and has been authorized for detection and/or diagnosis of SARS-CoV-2 by FDA under an Emergency Use Authorization (EUA).  This EUA will remain in effect (meaning this test can be used) for the duration of the COVID-19 declaration under Section 564(b)(1) of the Act, 21 U.S.C. section 360bbb-3(b)(1), unless the authorization is terminated or revoked sooner.  Performed at [facility], [address] [place], [place] [address]     Labs: CBC: Recent Labs  Lab 12/11/21 0248 12/12/21 0745 12/13/21 0320 12/14/21 0602 12/15/21 0325  WBC 10.7* 9.9 7.6 7.8 6.4  NEUTROABS 7.0 7.9* 4.7 4.7 3.0  HGB 11.6* 13.1 12.4 12.5 11.9*  HCT 34.0* 38.2 36.3 37.3 35.3*  MCV 91.9 91.0 91.4 92.8 92.4  PLT 236 274 276 283 292   Basic Metabolic Panel: Recent Labs  Lab 12/10/21 0227 12/11/21 0248 12/12/21 0745 12/13/21 0320 12/14/21 0602 12/15/21 0325  NA 141 139 139 136 138  --   K 4.0  3.7 3.9 3.4* 3.6  --   CL 107 108 105 100 104  --   CO2 20* 24 20* 25 22  --   GLUCOSE 126* 123* 142* 152* 150*  --   BUN 21 15 11 11 11   --   CREATININE 1.77* 1.45* 1.04* 0.96 1.09*  --   CALCIUM 9.4 8.8* 9.2 9.0 9.5  --   MG 1.8 1.8 1.7 2.0 1.8 1.9  PHOS 4.0 3.6 3.0 3.2 3.8 4.0   Liver Function Tests: Recent Labs  Lab 12/09/21 0240 12/10/21 0227 12/11/21 0248 12/12/21 0745 12/13/21 0320  AST 42* 54* 40 39 23  ALT 28 58* 56* 59* 45*  ALKPHOS 30* 36* 37* 39 35*  BILITOT 1.1 0.7 0.8 0.7 0.6  PROT 5.6* 6.3* 5.5* 6.6 5.9*  ALBUMIN 3.2* 3.5 3.1* 3.5 3.4*   CBG: Recent Labs  Lab 12/14/21 1116 12/14/21 1612 12/14/21 2114 12/15/21 0619 12/15/21 1052  GLUCAP 198* 133* 146* 141* 169*    Discharge time spent: greater than 30 minutes.  Signed: 02/14/22, MD [facility] 12/15/2021

## 2021-12-15 NOTE — Progress Notes (Signed)
Rounding Note    Patient Name: Jade Perez Date of Encounter: 12/15/2021  [facility] Cardiologist: Thomasene Ripple, DO   Subjective   Able to tolerate PO.  No residual flank pain. No CP.  No SOB.  Inpatient Medications    Scheduled Meds:  amLODipine  5 mg Oral Daily   atorvastatin  40 mg Oral Daily   B-complex with vitamin C  1 tablet Oral Daily   citalopram  20 mg Oral Daily   fenofibrate  160 mg Oral Daily   fesoterodine  4 mg Oral Daily   heparin  5,000 Units Subcutaneous Q8H   insulin aspart  0-9 Units Subcutaneous TID WC   loratadine  10 mg Oral Daily   montelukast  10 mg Oral QHS   pantoprazole  40 mg Oral BID   sodium chloride flush  3 mL Intravenous Q12H   tamsulosin  0.4 mg Oral QPC supper   Continuous Infusions:  sodium chloride     cefTRIAXone (ROCEPHIN)  IV Stopped (12/14/21 1046)   chlorproMAZINE (THORAZINE) 12.5 mg in sodium chloride 0.9 % 25 mL IVPB 12.5 mg (12/12/21 0114)   PRN Meds: sodium chloride, acetaminophen, albuterol, chlorproMAZINE (THORAZINE) 12.5 mg in sodium chloride 0.9 % 25 mL IVPB, menthol-cetylpyridinium, morphine injection, naLOXone (NARCAN)  injection, nitroGLYCERIN, ondansetron (ZOFRAN) IV, phenazopyridine, simethicone, sodium chloride flush, trimethobenzamide   Vital Signs    Vitals:   12/14/21 1300 12/14/21 1700 12/14/21 2023 12/15/21 0618  BP: 120/68 121/78 137/77 (!) 140/80  Pulse: 76 75 72 67  Resp: 20 20 18 18   Temp: 98.7 F (37.1 C) 97.9 F (36.6 C) 98.5 F (36.9 C) 98.5 F (36.9 C)  TempSrc: Oral Oral Oral Oral  SpO2: 99% 98% 100% 99%  Weight:    87.1 kg  Height:        Intake/Output Summary (Last 24 hours) at 12/15/2021 0900 Last data filed at 12/15/2021 0500 Gross per 24 hour  Intake 640 ml  Output 700 ml  Net -60 ml      12/15/2021    6:18 AM 12/14/2021    3:36 AM 12/13/2021    4:00 AM  Last 3 Weights  Weight (lbs) 192 lb 1.6 oz 191 lb 3.2 oz 192 lb 1.6 oz  Weight (kg) 87.136 kg 86.728 kg 87.136  kg      Telemetry    Sinus Rhythm - Personally Reviewed  ECG    Pending QTc today- Personally Reviewed  Physical Exam   GEN: No acute distress.   Neck: No JVD Cardiac: RRR, no murmurs, rubs, or gallops.  Respiratory: Clear to auscultation bilaterally. GI: Soft, nontender, non-distended  MS: No edema; No deformity. Neuro:  Nonfocal  Psych: Normal affect   Labs    High Sensitivity Troponin:   Recent Labs  Lab 12/06/21 0849 12/06/21 1053 12/07/21 0624 12/08/21 1152  TROPONINIHS 1,124* 2,511* 3,305* 1,071*     Chemistry Recent Labs  Lab 12/11/21 0248 12/12/21 0745 12/13/21 0320 12/14/21 0602 12/15/21 0325  NA 139 139 136 138  --   K 3.7 3.9 3.4* 3.6  --   CL 108 105 100 104  --   CO2 24 20* 25 22  --   GLUCOSE 123* 142* 152* 150*  --   BUN 15 11 11 11   --   CREATININE 1.45* 1.04* 0.96 1.09*  --   CALCIUM 8.8* 9.2 9.0 9.5  --   MG 1.8 1.7 2.0 1.8 1.9  PROT 5.5* 6.6 5.9*  --   --  ALBUMIN 3.1* 3.5 3.4*  --   --   AST 40 39 23  --   --   ALT 56* 59* 45*  --   --   ALKPHOS 37* 39 35*  --   --   BILITOT 0.8 0.7 0.6  --   --   GFRNONAA 40* 59* >60 56*  --   ANIONGAP 7 14 11 12   --     Lipids  No results for input(s): "CHOL", "TRIG", "HDL", "LABVLDL", "LDLCALC", "CHOLHDL" in the last 168 hours.   Hematology Recent Labs  Lab 12/13/21 0320 12/14/21 0602 12/15/21 0325  WBC 7.6 7.8 6.4  RBC 3.97 4.02 3.82*  HGB 12.4 12.5 11.9*  HCT 36.3 37.3 35.3*  MCV 91.4 92.8 92.4  MCH 31.2 31.1 31.2  MCHC 34.2 33.5 33.7  RDW 13.2 13.4 13.4  PLT 276 283 292   Thyroid No results for input(s): "TSH", "FREET4" in the last 168 hours.  BNPNo results for input(s): "BNP", "PROBNP" in the last 168 hours.  DDimer No results for input(s): "DDIMER" in the last 168 hours.   Radiology    DG Abd 1 View  Result Date: 12/13/2021 CLINICAL DATA:  Vomiting, stents in place. EXAM: ABDOMEN - 1 VIEW COMPARISON:  CT of the abdomen and pelvis from September fourth of 2023 FINDINGS:  Bowel gas pattern without signs of obstruction. Double-J ureteral stents are in place, proximal loops in the area of the renal pelvis, distal loops in the area of the urinary bladder. A 6 mm calculus has likely been pushed retrograde during placement of double-J ureteral stents into the LEFT renal pelvis from the LEFT UPJ as it was position on the study of December 10, 2021. A large phlebolith projects to the LEFT of the stent in the pelvis. Small RIGHT-sided and LEFT-sided urinary calculi elsewhere in the kidneys are similar to previous imaging. On limited assessment there is no acute skeletal finding. IMPRESSION: Double-J ureteral stents in place. Calculus adjacent to the proximal loop of the LEFT urinary tract stent. No acute findings. Electronically Signed   By: December 12, 2021 M.D.   On: 12/13/2021 14:29    Cardiac Studies   Cath: 12/07/21   CONCLUSIONS:   Right dominant coronary anatomy.  Widely patent coronaries without evidence of obstructive disease. Suggestion of mild mid anterior wall hypokinesis with LVEDP 18 mmHg.   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  Rule out myocarditis.  Rule out MINOCA.  Cardiac MRI can be helpful and further identifying etiology.   Echo: 12/07/21   IMPRESSIONS     1. Left ventricular ejection fraction, by estimation, is 50 to 55%. The  left ventricle has low normal function. The left ventricle demonstrates  regional wall motion abnormalities (see scoring diagram/findings for  description). Left ventricular diastolic   parameters are consistent with Grade I diastolic dysfunction (impaired  relaxation).   2. Right ventricular systolic function is normal. The right ventricular  size is normal. Tricuspid regurgitation signal is inadequate for assessing  PA pressure.   3. The mitral valve is normal in structure. Trivial mitral valve  regurgitation. No evidence of mitral stenosis.   4. The aortic valve is  tricuspid. Aortic valve regurgitation is not  visualized. No aortic stenosis is present.   5. The inferior vena cava is normal in size with greater than 50%  respiratory variability, suggesting right atrial pressure of 3 mmHg.   FINDINGS   Left Ventricle: Left ventricular ejection fraction, by estimation,  is 50  to 55%. The left ventricle has low normal function. The left ventricle  demonstrates regional wall motion abnormalities. The left ventricular  internal cavity size was normal in  size. There is no left ventricular hypertrophy. Left ventricular diastolic  parameters are consistent with Grade I diastolic dysfunction (impaired  relaxation).      LV Wall Scoring:  The antero-lateral wall, inferior wall, and posterior wall are  hypokinetic.   Right Ventricle: The right ventricular size is normal. No increase in  right ventricular wall thickness. Right ventricular systolic function is  normal. Tricuspid regurgitation signal is inadequate for assessing PA  pressure. The tricuspid regurgitant  velocity is 1.37 m/s, and with an assumed right atrial pressure of 3 mmHg,  the estimated right ventricular systolic pressure is [id number].   Left Atrium: Left atrial size was normal in size.   Right Atrium: Right atrial size was normal in size.   Pericardium: There is no evidence of pericardial effusion.   Mitral Valve: The mitral valve is normal in structure. Trivial mitral  valve regurgitation. No evidence of mitral valve stenosis.   Tricuspid Valve: The tricuspid valve is normal in structure. Tricuspid  valve regurgitation is not demonstrated. No evidence of tricuspid  stenosis.   Aortic Valve: The aortic valve is tricuspid. Aortic valve regurgitation is  not visualized. Aortic regurgitation PHT measures 718 msec. No aortic  stenosis is present. Aortic valve mean gradient measures 3.0 mmHg. Aortic  valve peak gradient measures 6.7  mmHg. Aortic valve area, by VTI measures 1.39  cm.   Pulmonic Valve: The pulmonic valve was not well visualized. Pulmonic valve  regurgitation is not visualized. No evidence of pulmonic stenosis.   Aorta: The aortic root is normal in size and structure.   Venous: The inferior vena cava is normal in size with greater than 50%  respiratory variability, suggesting right atrial pressure of 3 mmHg.   IAS/Shunts: The interatrial septum was not well visualized.   Patient Profile     66 y.o. female with PMH of anemia, asthma, DM, HLD, HTN, OSA not on CPAP, who was admitted yesterday with flank pain, N/V and found to have elevated troponin.  No past cardiac hx.  Assessment & Plan    Elevated troponin Demand ischemia -- hsTn peaked at 3305, underwent cardiac cath noted above with normal coronaries.  - Has been recommended for outpatient cardiac MRI which is ordered, along with follow up   Prolonged QT -- noted >500 on admission, EKG 9/8 478 -- Celexa to 20mg  daily  -- will repeat EKG today for new baseline  N/V Flank pain UTI Renal stone 0.8 cm/numerous bilateral calculi -- nausea significant improved, received IVFs -- s/p cystoscopy with bilateral ureteral stent placement 9/5 -- management per urology (she will follow with [facility] in future)   HLD -- LDL 59, HDL 38 -- on statin   HTN - BP improves with losartan, norvasc and pain control   DM -- Hgb A1c 6.9 -- has been on farxiga but would hold this in the setting of UTI with plans to resume as an outpatient  Has 12/24/21 follow up APPT and an MRI scheduled 01/09/22 Will keep on our list just in case but suspect DC today   For questions or updates, please contact [place] Please consult [email or web address] for contact info under     Rudean Haskell, MD [place]  [address], [place] [address] [phone]  9:00 AM

## 2021-12-15 NOTE — Plan of Care (Signed)
  Problem: Education: Goal: Knowledge of General Education information will improve Description: Including pain rating scale, medication(s)/side effects and non-pharmacologic comfort measures Outcome: Progressing   Problem: Health Behavior/Discharge Planning: Goal: Ability to manage health-related needs will improve Outcome: Progressing   Problem: Clinical Measurements: Goal: Ability to maintain clinical measurements within normal limits will improve Outcome: Progressing Goal: Diagnostic test results will improve Outcome: Progressing Goal: Cardiovascular complication will be avoided Outcome: Progressing   Problem: Elimination: Goal: Will not experience complications related to bowel motility Outcome: Progressing   Problem: Education: Goal: Ability to describe self-care measures that may prevent or decrease complications (Diabetes Survival Skills Education) will improve Outcome: Progressing Goal: Individualized Educational Video(s) Outcome: Progressing   Problem: Coping: Goal: Ability to adjust to condition or change in health will improve Outcome: Progressing   Problem: Health Behavior/Discharge Planning: Goal: Ability to identify and utilize available resources and services will improve Outcome: Progressing Goal: Ability to manage health-related needs will improve Outcome: Progressing   Problem: Metabolic: Goal: Ability to maintain appropriate glucose levels will improve Outcome: Progressing   Problem: Metabolic: Goal: Ability to maintain appropriate glucose levels will improve Outcome: Progressing   Problem: Education: Goal: Understanding of CV disease, CV risk reduction, and recovery process will improve Outcome: Progressing   Problem: Cardiovascular: Goal: Ability to achieve and maintain adequate cardiovascular perfusion will improve Outcome: Progressing Goal: Vascular access site(s) Level 0-1 will be maintained Outcome: Progressing   Problem: Health  Behavior/Discharge Planning: Goal: Ability to safely manage health-related needs after discharge will improve Outcome: Progressing

## 2021-12-17 ENCOUNTER — Other Ambulatory Visit: Payer: Self-pay | Admitting: Urology

## 2021-12-17 ENCOUNTER — Telehealth: Payer: Self-pay | Admitting: Cardiology

## 2021-12-17 ENCOUNTER — Telehealth: Payer: Self-pay | Admitting: Physician Assistant

## 2021-12-17 NOTE — Telephone Encounter (Signed)
Returned call to patient who states she wants to make sure that its safe for her to have her teeth cleaning done prior to her CMRI. Patient states that she was told the CMRI would help determine what happened with her heart as her LHC was normal. Patient states she just wanted to check in prior to having any dental procedures. Patient confirms it is just a teeth cleaning.   Patient also states that in the hospital her celexa was decreased to 20mg  due to her QT being prolonged. Patient states that upon discharge her paper work states for her to return to her 40mg  dosing. Patient states that she is nervous to adjust her dose again as she was told the lower dose helped her QT. Patient states she asked her PCP but was told to check with Cardiology. Advised patient I would forward message to Dr. for her to review and advise. Patient verbalized understanding.

## 2021-12-17 NOTE — Telephone Encounter (Signed)
     Pre-operative Risk Assessment    Patient Name: Jade Perez  DOB: Oct 21, 1955 MRN: [id number]      Request for Surgical Clearance    Procedure:   Bilateral ureteroscopy   Date of Surgery:  Clearance 01/02/22                                 Surgeon:  Dr. Sebastian Ache Surgeon's Group or Practice Name:  [facility] Phone number:  [phone] Fax number:  [phone]   Type of Clearance Requested:   - Medical    Type of Anesthesia:  General    Additional requests/questions:    Signed, Noe Gens   12/17/2021, 12:43 PM

## 2021-12-17 NOTE — Telephone Encounter (Signed)
Patient has teeth cleaning scheduled on 9/26 and she would like to know if she should delay the teeth cleaning until after her MRI .

## 2021-12-17 NOTE — Progress Notes (Signed)
Reviewed pt chart for pre-op interview for surgery scheduled @ [facility] on 01-02-2022 by Dr Berneice Heinrich.  Noted pt had NSTEMI 12-06-2021 ED visit with hospital admission in epic with probable stress cardiomyopathy.  Pt will need cardiac clearance for this surgery.  Called and left message with OR scheduler , Monica Martinez, for Dr Berneice Heinrich. Informed her that pt will need cardiac clearance  prior to this surgery and that she does have a cardiac office visit already scheduled  w/ [facility] on 12-24-2021.

## 2021-12-18 NOTE — Telephone Encounter (Signed)
Pt has appt with Bettina Gavia, [facility] 12/24/21. I have added need pre op clearance to appt notes.  Will update the requesting office pt has appt. Will send notes to Bettina Gavia, [facility] for appt.

## 2021-12-24 ENCOUNTER — Emergency Department (HOSPITAL_BASED_OUTPATIENT_CLINIC_OR_DEPARTMENT_OTHER): Payer: Medicare PPO

## 2021-12-24 ENCOUNTER — Ambulatory Visit: Payer: Medicare PPO | Admitting: Physician Assistant

## 2021-12-24 ENCOUNTER — Other Ambulatory Visit: Payer: Self-pay

## 2021-12-24 ENCOUNTER — Encounter (HOSPITAL_BASED_OUTPATIENT_CLINIC_OR_DEPARTMENT_OTHER): Payer: Self-pay | Admitting: Emergency Medicine

## 2021-12-24 ENCOUNTER — Emergency Department (HOSPITAL_BASED_OUTPATIENT_CLINIC_OR_DEPARTMENT_OTHER)
Admission: EM | Admit: 2021-12-24 | Discharge: 2021-12-24 | Disposition: A | Discharge status: Home / Self Care | Payer: Medicare PPO | Attending: Emergency Medicine | Admitting: Emergency Medicine

## 2021-12-24 DIAGNOSIS — Z7984 Long term (current) use of oral hypoglycemic drugs: Secondary | ICD-10-CM | POA: Diagnosis not present

## 2021-12-24 DIAGNOSIS — K429 Umbilical hernia without obstruction or gangrene: Secondary | ICD-10-CM | POA: Diagnosis not present

## 2021-12-24 DIAGNOSIS — Z79899 Other long term (current) drug therapy: Secondary | ICD-10-CM | POA: Insufficient documentation

## 2021-12-24 DIAGNOSIS — M545 Low back pain, unspecified: Secondary | ICD-10-CM | POA: Diagnosis not present

## 2021-12-24 DIAGNOSIS — E119 Type 2 diabetes mellitus without complications: Secondary | ICD-10-CM | POA: Diagnosis not present

## 2021-12-24 DIAGNOSIS — Z7951 Long term (current) use of inhaled steroids: Secondary | ICD-10-CM | POA: Insufficient documentation

## 2021-12-24 DIAGNOSIS — J45909 Unspecified asthma, uncomplicated: Secondary | ICD-10-CM | POA: Diagnosis not present

## 2021-12-24 DIAGNOSIS — M549 Dorsalgia, unspecified: Secondary | ICD-10-CM

## 2021-12-24 DIAGNOSIS — K5730 Diverticulosis of large intestine without perforation or abscess without bleeding: Secondary | ICD-10-CM | POA: Diagnosis not present

## 2021-12-24 DIAGNOSIS — I10 Essential (primary) hypertension: Secondary | ICD-10-CM | POA: Diagnosis not present

## 2021-12-24 DIAGNOSIS — R112 Nausea with vomiting, unspecified: Secondary | ICD-10-CM | POA: Diagnosis not present

## 2021-12-24 DIAGNOSIS — K439 Ventral hernia without obstruction or gangrene: Secondary | ICD-10-CM | POA: Diagnosis not present

## 2021-12-24 DIAGNOSIS — Z91040 Latex allergy status: Secondary | ICD-10-CM | POA: Diagnosis not present

## 2021-12-24 DIAGNOSIS — K449 Diaphragmatic hernia without obstruction or gangrene: Secondary | ICD-10-CM | POA: Diagnosis not present

## 2021-12-24 DIAGNOSIS — D72829 Elevated white blood cell count, unspecified: Secondary | ICD-10-CM | POA: Diagnosis not present

## 2021-12-24 LAB — CBC WITH DIFFERENTIAL/PLATELET
Abs Immature Granulocytes: 0.02 10*3/uL (ref 0.00–0.07)
Basophils Absolute: 0 10*3/uL (ref 0.0–0.1)
Basophils Relative: 0 %
Eosinophils Absolute: 0.1 10*3/uL (ref 0.0–0.5)
Eosinophils Relative: 2 %
HCT: 40.8 % (ref 36.0–46.0)
Hemoglobin: 13.9 g/dL (ref 12.0–15.0)
Immature Granulocytes: 0 %
Lymphocytes Relative: 22 %
Lymphs Abs: 1.5 10*3/uL (ref 0.7–4.0)
MCH: 30.8 pg (ref 26.0–34.0)
MCHC: 34.1 g/dL (ref 30.0–36.0)
MCV: 90.5 fL (ref 80.0–100.0)
Monocytes Absolute: 0.9 10*3/uL (ref 0.1–1.0)
Monocytes Relative: 13 %
Neutro Abs: 4.2 10*3/uL (ref 1.7–7.7)
Neutrophils Relative %: 63 %
Platelets: 310 10*3/uL (ref 150–400)
RBC: 4.51 MIL/uL (ref 3.87–5.11)
RDW: 12.8 % (ref 11.5–15.5)
WBC: 6.7 10*3/uL (ref 4.0–10.5)
nRBC: 0 % (ref 0.0–0.2)

## 2021-12-24 LAB — COMPREHENSIVE METABOLIC PANEL
ALT: 31 U/L (ref 0–44)
AST: 23 U/L (ref 15–41)
Albumin: 3.9 g/dL (ref 3.5–5.0)
Alkaline Phosphatase: 39 U/L (ref 38–126)
Anion gap: 8 (ref 5–15)
BUN: 16 mg/dL (ref 8–23)
CO2: 24 mmol/L (ref 22–32)
Calcium: 9.5 mg/dL (ref 8.9–10.3)
Chloride: 103 mmol/L (ref 98–111)
Creatinine, Ser: 1.09 mg/dL — ABNORMAL HIGH (ref 0.44–1.00)
GFR, Estimated: 56 mL/min — ABNORMAL LOW (ref >60)
Glucose, Bld: 164 mg/dL — ABNORMAL HIGH (ref 70–99)
Potassium: 3.8 mmol/L (ref 3.5–5.1)
Sodium: 135 mmol/L (ref 135–145)
Total Bilirubin: 0.7 mg/dL (ref 0.3–1.2)
Total Protein: 7.2 g/dL (ref 6.5–8.1)

## 2021-12-24 LAB — TROPONIN I (HIGH SENSITIVITY)
Troponin I (High Sensitivity): 38 ng/L — ABNORMAL HIGH (ref <18)
Troponin I (High Sensitivity): 38 ng/L — ABNORMAL HIGH (ref <18)

## 2021-12-24 LAB — URINALYSIS, ROUTINE W REFLEX MICROSCOPIC
Bilirubin Urine: NEGATIVE
Glucose, UA: NEGATIVE mg/dL
Ketones, ur: NEGATIVE mg/dL
Nitrite: NEGATIVE
Protein, ur: >=300 mg/dL — AB
Specific Gravity, Urine: 1.02 (ref 1.005–1.030)
pH: 8.5 — ABNORMAL HIGH (ref 5.0–8.0)

## 2021-12-24 LAB — URINALYSIS, MICROSCOPIC (REFLEX): RBC / HPF: >50 RBC/hpf (ref 0–5)

## 2021-12-24 LAB — LIPASE, BLOOD: Lipase: 38 U/L (ref 11–51)

## 2021-12-24 MED ORDER — HYDROCODONE-ACETAMINOPHEN 5-325 MG PO TABS
1.0000 | ORAL_TABLET | Freq: Once | ORAL | Status: AC
  Administered 2021-12-24: 1 via ORAL
  Filled 2021-12-24: qty 1

## 2021-12-24 MED ORDER — LACTATED RINGERS IV BOLUS
1000.0000 mL | Freq: Once | INTRAVENOUS | Status: AC
  Administered 2021-12-24: 1000 mL via INTRAVENOUS

## 2021-12-24 MED ORDER — ONDANSETRON 4 MG PO TBDP: 4.0000 mg | ORAL_TABLET | Freq: Three times a day (TID) | ORAL | 0 refills | Status: DC | PRN

## 2021-12-24 MED ORDER — HYDROCODONE-ACETAMINOPHEN 5-325 MG PO TABS: 1.0000 | ORAL_TABLET | ORAL | 0 refills | Status: AC | PRN

## 2021-12-24 NOTE — ED Triage Notes (Signed)
Patient presents to ED via POV from home. Here with nausea and vomiting that started on Friday. Recently hospitalization for cardiac cath and urinary stents.

## 2021-12-24 NOTE — Discharge Instructions (Signed)
You were evaluated in the Emergency Department and after careful evaluation, we did not find any emergent condition requiring admission or further testing in the hospital.  Please take the prescribed medication as directed until finished.  We can only give you a 5-day course here in the ER, please follow-up with your primary care doctor or urology if you require a longer course of pain medications.  Please return to the Emergency Department if you experience any worsening of your condition.  We encourage you to follow up with a primary care provider.  Thank you for allowing [place] to be a part of your care.

## 2021-12-24 NOTE — ED Provider Notes (Cosign Needed Addendum)
[facility] EMERGENCY DEPARTMENT Provider Note   CSN: [id number] Arrival date & time: 12/24/21  1409     History  Chief Complaint  Patient presents with   Nausea    Jade Perez is a 66 y.o. female.  HPI 66 year old female with a history of NSTEMI, bilateral kidney stones and recent bilateral stent placement by Dr. Berneice Heinrich on 9/5, GERD, DM type II, depression, hypertension, asthma, anemia, sleep apnea presents to the ER with complaints of bilateral flank aching and nausea and vomiting.  She states that she has had issues with nausea and vomiting throughout her hospital stay, but this improved slightly after she left the hospital.  She does have Zofran and has been taking Tylenol.  She states however over the last several days she continues to have worsening pain in vomiting.  She states that she will get severe pain and then that causes her to have nausea and occasional vomiting.  No hematemesis.  Denies any chest pain.  She states that she cannot take NSAIDs.  Denies any fevers or chills.   Home Medications Prior to Admission medications   Medication Sig Start Date End Date Taking? Authorizing Provider  HYDROcodone-acetaminophen (NORCO/VICODIN) 5-325 MG tablet Take 1 tablet by mouth every 4 (four) hours as needed for up to 5 days. 12/24/21 12/29/21 Yes Mare Ferrari, PA-C  ondansetron (ZOFRAN-ODT) 4 MG disintegrating tablet Take 1 tablet (4 mg total) by mouth every 8 (eight) hours as needed for nausea or vomiting. 12/24/21  Yes Mare Ferrari, PA-C  ACCU-CHEK AVIVA PLUS test strip 1 each daily. 12/03/19   [provider]  acetaminophen (TYLENOL) 500 MG tablet Take 500 mg by mouth every 6 (six) hours as needed for mild pain.    [provider]  albuterol (PROAIR HFA) 108 (90 Base) MCG/ACT inhaler Can inhale two puffs every four to six hours as needed for cough or wheeze. Patient taking differently: Inhale 1 puff into the lungs every 6 (six) hours as needed  for shortness of breath. 06/08/20   Kozlow, Alvira Philips, MD  amLODipine (NORVASC) 5 MG tablet Take 1 tablet (5 mg total) by mouth daily. 12/16/21   Regalado, Belkys A, MD  atorvastatin (LIPITOR) 40 MG tablet Take 40 mg by mouth daily.    [provider]  b complex vitamins tablet Take 1 tablet by mouth daily.    [provider]  cetirizine (ZYRTEC) 10 MG tablet Take 10 mg by mouth daily.    [provider]  citalopram (CELEXA) 40 MG tablet Take 40 mg by mouth daily.    [provider]  diclofenac Sodium (VOLTAREN) 1 % GEL Apply 4 g topically 4 (four) times daily. Patient taking differently: Apply 4 g topically daily as needed (for pain). 07/03/21   Asencion Islam, DPM  fenofibrate (TRICOR) 145 MG tablet Take 145 mg by mouth daily. 09/22/19   [provider]  Ferrous Sulfate (SLOW FE PO) Take 1 tablet by mouth daily.    [provider]  fesoterodine (TOVIAZ) 4 MG TB24 tablet Take 1 tablet (4 mg total) by mouth daily. 12/15/21 01/14/22  Regalado, Belkys A, MD  FIBER PO Take 3 tablets by mouth daily.     [provider]  glimepiride (AMARYL) 1 MG tablet Take 0.5 mg by mouth 2 (two) times daily. 02/15/19   [provider]  montelukast (SINGULAIR) 10 MG tablet TAKE 1 TABLET BY MOUTH EVERY DAY Patient taking differently: Take 10 mg by mouth at  bedtime. 07/19/19   Kozlow, Alvira PhilipsEric J, MD  Multiple Vitamin (MULTIVITAMIN WITH MINERALS) TABS Take 1 tablet by mouth daily.    [provider]  Omega-3 Fatty Acids (FISH OIL PO) Take 1,000 mg by mouth 2 (two) times daily.    [provider]  omeprazole (PRILOSEC) 40 MG capsule TAKE 1 CAPSULE BY MOUTH EVERY DAY IN THE MORNING BEFORE BREAKFAST Patient taking differently: Take 40 mg by mouth daily. 07/30/21   Kozlow, Alvira PhilipsEric J, MD  ondansetron (ZOFRAN) 4 MG tablet Take 1 tablet (4 mg total) by mouth daily as needed for nausea or vomiting. 12/15/21 12/15/22  Regalado, Belkys A, MD  OZEMPIC, 1 MG/DOSE, 4  MG/3ML SOPN Inject 2 mg into the skin once a week. Tuesday 05/25/20   [provider]  phenazopyridine (PYRIDIUM) 100 MG tablet Take 1 tablet (100 mg total) by mouth 3 (three) times daily as needed (dysuria). 12/15/21   Regalado, Belkys A, MD  tamsulosin (FLOMAX) 0.4 MG CAPS capsule Take 1 capsule (0.4 mg total) by mouth daily after supper. 12/15/21   Regalado, Belkys A, MD  triamcinolone (NASACORT) 55 MCG/ACT AERO nasal inhaler Place 2 sprays into the nose daily as needed (allergies).    [provider]  Vitamin D, Ergocalciferol, (DRISDOL) 50000 UNITS CAPS capsule Take 50,000 Units by mouth every 30 (thirty) days.     [provider]      Allergies    Amoxicillin, Aspirin, Bee venom, Clarithromycin, Dog epithelium allergy skin test, Erythromycin, Ibuprofen, Nsaids, Oxycodone, Sulfa antibiotics, Latex, and Tape    Review of Systems   Review of Systems  Physical Exam Updated Vital Signs BP 113/69   Pulse 84   Temp 98.3 F (36.8 C) (Oral)   Resp 14   Ht 5\' 2"  (1.575 m)   Wt 84.8 kg   SpO2 96%   BMI 34.20 kg/m  Physical Exam Vitals and nursing note reviewed.  Constitutional:      General: She is not in acute distress.    Appearance: She is well-developed.  HENT:     Head: Normocephalic and atraumatic.  Eyes:     Conjunctiva/sclera: Conjunctivae normal.  Cardiovascular:     Rate and Rhythm: Normal rate and regular rhythm.     Heart sounds: No murmur heard. Pulmonary:     Effort: Pulmonary effort is normal. No respiratory distress.     Breath sounds: Normal breath sounds.  Abdominal:     Palpations: Abdomen is soft.     Tenderness: There is no abdominal tenderness.  Musculoskeletal:        General: No swelling.     Cervical back: Neck supple.  Skin:    General: Skin is warm and dry.     Capillary Refill: Capillary refill takes less than 2 seconds.  Neurological:     Mental Status: She is alert.  Psychiatric:        Mood and Affect: Mood normal.      ED Results / Procedures / Treatments   Labs (all labs ordered are listed, but only abnormal results are displayed) Labs Reviewed  COMPREHENSIVE METABOLIC PANEL - Abnormal; Notable for the following components:      Result Value   Glucose, Bld 164 (*)    Creatinine, Ser 1.09 (*)    GFR, Estimated 56 (*)    All other components within normal limits  URINALYSIS, ROUTINE W REFLEX MICROSCOPIC - Abnormal; Notable for the following components:   Color, Urine RED (*)    APPearance  CLOUDY (*)    pH 8.5 (*)    Hgb urine dipstick LARGE (*)    Protein, ur >=300 (*)    Leukocytes,Ua SMALL (*)    All other components within normal limits  URINALYSIS, MICROSCOPIC (REFLEX) - Abnormal; Notable for the following components:   Bacteria, UA FEW (*)    All other components within normal limits  TROPONIN I (HIGH SENSITIVITY) - Abnormal; Notable for the following components:   Troponin I (High Sensitivity) 38 (*)    All other components within normal limits  TROPONIN I (HIGH SENSITIVITY) - Abnormal; Notable for the following components:   Troponin I (High Sensitivity) 38 (*)    All other components within normal limits  CBC WITH DIFFERENTIAL/PLATELET  LIPASE, BLOOD    EKG None  Radiology CT Renal Stone Study  Result Date: 12/24/2021 CLINICAL DATA:  Nausea and vomiting since Friday, urinary stents, bilateral flank pain EXAM: CT ABDOMEN AND PELVIS WITHOUT CONTRAST TECHNIQUE: Multidetector CT imaging of the abdomen and pelvis was performed following the standard protocol without IV contrast. RADIATION DOSE REDUCTION: This exam was performed according to the departmental dose-optimization program which includes automated exposure control, adjustment of the mA and/or kV according to patient size and/or use of iterative reconstruction technique. COMPARISON:  12/10/2021, 12/13/2021 FINDINGS: Lower chest: No acute pleural or parenchymal lung disease. Hepatobiliary: Stable hepatic steatosis. No focal  abnormalities on this unenhanced exam. Gallbladder is unremarkable. Pancreas: Unremarkable unenhanced appearance. Spleen: Unremarkable unenhanced appearance. Adrenals/Urinary Tract: There are multiple bilateral nonobstructing renal calculi. The obstructing left UPJ calculus seen previously has reflux into the lower pole left kidney measuring 7 mm. The other numerous bilateral nonobstructing calculi are stable in size and number, largest on the right measuring 5 mm. Bilateral ureteral stents are seen extending from the renal pelves to the bladder lumen. No evidence of ureteral or bladder calculi. No evidence of hydronephrosis or hydroureter. Interval development of bilateral retroperitoneal fat stranding along the course of the ureters may be due to recent procedure or underlying infection. The adrenals are unremarkable.  Bladder is grossly normal. Stomach/Bowel: No bowel obstruction or ileus. Scattered diverticulosis of the distal colon without diverticulitis. No bowel wall thickening or inflammatory change. Small hiatal hernia. Prior appendectomy. Vascular/Lymphatic: No significant vascular findings are present. No enlarged abdominal or pelvic lymph nodes. Reproductive: Uterus and bilateral adnexa are unremarkable. Other: No free fluid or free intraperitoneal gas. Stable fat containing periumbilical ventral hernia. Musculoskeletal: No acute or destructive bony lesions. Reconstructed images demonstrate no additional findings. IMPRESSION: 1. Interval reflux of the obstructing left UPJ calculus into the lower pole collecting system left kidney. 2. Bilateral ureteral stent placement as above. Peri ureteral fat stranding within the retroperitoneum could be related to recent procedure or superimposed infection. 3. Other bilateral nonobstructing renal calculi as above. 4. Distal colonic diverticulosis without diverticulitis. 5. Small fat containing periumbilical ventral hernia and hiatal hernia. Electronically Signed    By: Randa Ngo M.D.   On: 12/24/2021 15:41   DG Chest Portable 1 View  Result Date: 12/24/2021 CLINICAL DATA:  66 year old female presents for evaluation of nausea and vomiting. EXAM: PORTABLE CHEST 1 VIEW COMPARISON:  December 06, 2021 FINDINGS: The heart size and mediastinal contours are within normal limits. Both lungs are clear. The visualized skeletal structures are unremarkable. IMPRESSION: No active disease. Electronically Signed   By: Zetta Bills M.D.   On: 12/24/2021 15:34    Procedures Procedures    Medications Ordered in ED Medications  HYDROcodone-acetaminophen (NORCO/VICODIN) 5-325  MG per tablet 1 tablet (1 tablet Oral Given 12/24/21 1516)  lactated ringers bolus 1,000 mL (0 mLs Intravenous Stopped 12/24/21 1619)    ED Course/ Medical Decision Making/ A&P Clinical Course as of 12/24/21 1818  Mon Dec 24, 2021  [id number] 66 year old female presenting with nausea and vomiting and flank pain bilaterally.  Recent hospitalization for bilateral stents and NSTEMI.  On arrival, BP slightly elevated 161/78, afebrile, not tachycardic, tachypneic or hypoxic.  Otherwise well-appearing.   Labs ordered, reviewed and interpreted by me.  CBC with a leukocytosis.  CMP with a baseline creatinine, no Electra abnormalities.  UA with proteinuria and large amount hemoglobin, small leukocytes.  Lipase is normal.  Initial troponin of 38.   Imaging ordered, reviewed and interpreted by me, agree with radiology read.  Chest x-ray without acute findings.  CT of the renal study with bilateral perinephric stranding.  Interval reflux of the large UPJ stone into the renal pelvis.  I discussed the CT scan findings with Dr. Mena Goes with urology.  This is a normal feel histologic finding after stenting, given no fevers, leukocytosis, there is no concern for infected stents at this time.  Patient was given Norco with good pain control.  She also received 1 L of fluids.  Plan to recheck a troponin, suspect the  patient will be stable for discharge with better pain control. [MB]  1812 Delta troponin slightly elevated but unchanged.  Patient has no chest pain. [MB]  1812 Overall work-up reassuring.  Will discharge with prescription for Norco, PDMP reviewed, appropriate.  We will also send an ODT version of Zofran.  Recommended urology follow-up.  We discussed return precautions.  She was understanding and is agreeable.  Stable for discharge. [MB]    Clinical Course User Index [MB] Mare Ferrari, PA-C                           Medical Decision Making Amount and/or Complexity of Data Reviewed Labs: ordered. Radiology: ordered.  Risk Prescription drug management.          Final Clinical Impression(s) / ED Diagnoses Final diagnoses:  Back pain, unspecified back location, unspecified back pain laterality, unspecified chronicity  Nausea and vomiting, unspecified vomiting type    Rx / DC Orders ED Discharge Orders          Ordered    HYDROcodone-acetaminophen (NORCO/VICODIN) 5-325 MG tablet  Every 4 hours PRN        12/24/21 1812    ondansetron (ZOFRAN-ODT) 4 MG disintegrating tablet  Every 8 hours PRN        12/24/21 1817                 Mare Ferrari, PA-C 12/24/21 1818    Derwood Kaplan, MD 12/25/21 [phone]

## 2021-12-24 NOTE — ED Notes (Signed)
Patient transported to CT 

## 2021-12-25 DIAGNOSIS — R9431 Abnormal electrocardiogram [ECG] [EKG]: Secondary | ICD-10-CM | POA: Diagnosis not present

## 2021-12-25 DIAGNOSIS — N200 Calculus of kidney: Secondary | ICD-10-CM | POA: Diagnosis not present

## 2021-12-25 DIAGNOSIS — Z6834 Body mass index (BMI) 34.0-34.9, adult: Secondary | ICD-10-CM | POA: Diagnosis not present

## 2021-12-25 DIAGNOSIS — I5181 Takotsubo syndrome: Secondary | ICD-10-CM | POA: Diagnosis not present

## 2021-12-25 DIAGNOSIS — Z7689 Persons encountering health services in other specified circumstances: Secondary | ICD-10-CM | POA: Diagnosis not present

## 2021-12-26 ENCOUNTER — Ambulatory Visit: Payer: Medicare PPO | Admitting: Nurse Practitioner

## 2021-12-26 ENCOUNTER — Encounter: Payer: Self-pay | Admitting: Nurse Practitioner

## 2021-12-26 VITALS — BP 128/70 | HR 84 | Ht 62.0 in | Wt 190.4 lb

## 2021-12-26 DIAGNOSIS — G4733 Obstructive sleep apnea (adult) (pediatric): Secondary | ICD-10-CM

## 2021-12-26 DIAGNOSIS — R9431 Abnormal electrocardiogram [ECG] [EKG]: Secondary | ICD-10-CM

## 2021-12-26 DIAGNOSIS — E782 Mixed hyperlipidemia: Secondary | ICD-10-CM | POA: Diagnosis not present

## 2021-12-26 DIAGNOSIS — E119 Type 2 diabetes mellitus without complications: Secondary | ICD-10-CM

## 2021-12-26 DIAGNOSIS — I5181 Takotsubo syndrome: Secondary | ICD-10-CM | POA: Diagnosis not present

## 2021-12-26 DIAGNOSIS — I214 Non-ST elevation (NSTEMI) myocardial infarction: Secondary | ICD-10-CM | POA: Diagnosis not present

## 2021-12-26 DIAGNOSIS — Z01810 Encounter for preprocedural cardiovascular examination: Secondary | ICD-10-CM | POA: Diagnosis not present

## 2021-12-26 DIAGNOSIS — N200 Calculus of kidney: Secondary | ICD-10-CM | POA: Diagnosis not present

## 2021-12-26 DIAGNOSIS — I10 Essential (primary) hypertension: Secondary | ICD-10-CM

## 2021-12-26 NOTE — Progress Notes (Signed)
Office Visit    Patient Name: Jade Perez Date of Encounter: 12/26/2021  Primary Care Provider:  Abner Greenspan, MD Primary Cardiologist:  Thomasene Ripple, DO  Chief Complaint    66 year old female with a history of NSTEMI (normal coronaries, presumed stress cardiomyopathy), prolonged QT interval, hypertension, hyperlipidemia, type 2 diabetes, OSA, asthma, kidney stones and GERD who presents for hospital follow-up related to NSTEMI.   Past Medical History    Past Medical History:  Diagnosis Date   Anemia    anemic in 2010   Anxiety    Arthritis    thumb and fingers, knee, right hip   Asthma    takes montelukast-when sick   Cataract    forming    Complication of anesthesia    difficulty waking up   Depression    Diabetes mellitus without complication (HCC)    type 2, diagnosed 2008   GERD (gastroesophageal reflux disease)    omeprazole and ranitidine   Heart murmur    since birth very slight   High cholesterol    History of hiatal hernia    small   History of kidney stones    december 3- January 7th   Hypertension    Sleep apnea    sleep study in [place], has but does not use CPAP, Dr. Yetta Flock has results [facility]   Past Surgical History:  Procedure Laterality Date   ABDOMINAL EXPLORATION SURGERY     APPENDECTOMY     BREAST EXCISIONAL BIOPSY Left    benign   CARPAL TUNNEL RELEASE Right 05/22/2017   Procedure: RIGHT CARPAL TUNNEL RELEASE;  Surgeon: Cindee Salt, MD;  Location: [facility];  Service: Orthopedics;  Laterality: Right;   CARPAL TUNNEL RELEASE Left 03/12/2018   Procedure: LEFT CARPAL TUNNEL RELEASE;  Surgeon: Cindee Salt, MD;  Location: [facility];  Service: Orthopedics;  Laterality: Left;   COLONOSCOPY  06/04/2012   cyst removed     right breast two   CYSTOSCOPY W/ URETERAL STENT PLACEMENT Bilateral 12/11/2021   Procedure: CYSTOSCOPY , BILATERAL RETROGRADE , BILATERAL STENT PLACEMENT;  Surgeon: Sebastian Ache, MD;  Location: [facility] OR;  Service: Urology;  Laterality: Bilateral;   DILATION AND CURETTAGE OF UTERUS     EYE SURGERY Left    laser surgery on retina for tear-done in office   LEFT HEART CATH AND CORONARY ANGIOGRAPHY N/A 12/07/2021   Procedure: LEFT HEART CATH AND CORONARY ANGIOGRAPHY;  Surgeon: Lyn Records, MD;  Location: [facility];  Service: Cardiovascular;  Laterality: N/A;   LIPOMA EXCISION     LIPOMA EXCISION     right shoulder   LITHOTRIPSY  03/2017   RETINAL DETACHMENT SURGERY Right    SCLERAL BUCKLE  10/15/2011   Procedure: SCLERAL BUCKLE;  Surgeon: Sherrie George, MD;  Location: [facility] OR;  Service: Ophthalmology;  Laterality: Right;   SCLERAL BUCKLE PROCEDURE Right    SHOULDER ARTHROSCOPY WITH ROTATOR CUFF REPAIR Right 03/25/2019   Procedure: Right shoulder arthroscopy, subacromial decompression, distal clavicle resection, rotator cuff repair;  Surgeon: Francena Hanly, MD;  Location: [facility];  Service: Orthopedics;  Laterality: Right;    UPPER GASTROINTESTINAL ENDOSCOPY  05/25/2014   WISDOM TOOTH EXTRACTION      Allergies  Allergies  Allergen Reactions   Amoxicillin Hives    Did it involve swelling of the face/tongue/throat, SOB, or low BP? No Did it involve sudden or severe rash/hives, skin peeling, or any reaction on the inside  of your mouth or nose? No Did you need to seek medical attention at a hospital or doctor's office? No When did it last happen?      15-20 years ago If all above answers are "NO", may proceed with cephalosporin use.    Aspirin Swelling   Bee Venom    Clarithromycin    Dog Epithelium Allergy Skin Test    Erythromycin Nausea Only and Other (See Comments)    Stomach pain, also   Ibuprofen Swelling   Nsaids    Oxycodone Itching   Sulfa Antibiotics Hives   Latex Rash   Tape Rash and Other (See Comments)    Band-Aids, also = blisters    History of Present Illness    66 year old female with the above past medical history  including NSTEMI (normal coronaries, presumed stress cardiomyopathy), prolonged QT interval, hypertension, hyperlipidemia, type 2 diabetes, OSA, asthma, kidney stones and GERD.  She presented to the ED on 12/10/2021 with flank pain, nausea and vomiting and was found to have an elevated troponin.  Troponin peaked at 06/09/2003.  Additionally, she was noted to have prolonged QT interval on EKG.  Celexa was decreased to 20 mg daily.  Cardiology was consulted.  Cardiac catheterization revealed normal coronary arteries.  Echocardiogram revealed EF 50 to 55%, low normal LV function, hypokinesis of the anterolateral, inferior, and posterior walls, G1 DD.  Findings were consistent with stress cardiomyopathy.  Outpatient cardiac MRI was recommended and scheduled for 01/09/2022.  She was noted to have numerous bilateral renal calculi.  Urology was consulted and she underwent cystoscopy with bilateral ureteral stent placement on 12/11/2021.  She was discharged home in stable condition on 12/15/2021.  She returned to the ED on 12/24/2021 with complaints of nausea.  CT of the abdomen pelvis was stable.  She was discharged home and prescribed antiemetics.  She presents today for follow-up accompanied by her husband, and for preoperative cardiac evaluation for bilateral ureteroscopy scheduled for 01/02/2022 with Dr. Berneice Heinrich of [facility].  Since her hospitalization she has been stable from a cardiac standpoint.  She does note some generalized fatigue and intermittent nausea. She denies any chest pain, dyspnea, edema, PND, orthopnea, or weight gain.  She is working on increasing her activity gradually.  She is eager to have her ureteral stents removed.  Otherwise, she reports feeling well and denies any additional concerns today.  Home Medications    Current Outpatient Medications  Medication Sig Dispense Refill   ACCU-CHEK AVIVA PLUS test strip 1 each daily.     acetaminophen (TYLENOL) 500 MG tablet Take 500 mg by mouth every  6 (six) hours as needed for mild pain.     albuterol (PROAIR HFA) 108 (90 Base) MCG/ACT inhaler Can inhale two puffs every four to six hours as needed for cough or wheeze. (Patient taking differently: Inhale 1 puff into the lungs every 6 (six) hours as needed for shortness of breath.) 8 g 1   amLODipine (NORVASC) 5 MG tablet Take 1 tablet (5 mg total) by mouth daily. 30 tablet 2   atorvastatin (LIPITOR) 40 MG tablet Take 40 mg by mouth daily.     b complex vitamins tablet Take 1 tablet by mouth daily.     cetirizine (ZYRTEC) 10 MG tablet Take 10 mg by mouth daily.     citalopram (CELEXA) 40 MG tablet Take 40 mg by mouth daily.     diclofenac Sodium (VOLTAREN) 1 % GEL Apply 4 g topically 4 (four) times daily. (Patient  taking differently: Apply 4 g topically daily as needed (for pain).) 150 g 0   fenofibrate (TRICOR) 145 MG tablet Take 145 mg by mouth daily.     Ferrous Sulfate (SLOW FE PO) Take 1 tablet by mouth daily.     fesoterodine (TOVIAZ) 4 MG TB24 tablet Take 1 tablet (4 mg total) by mouth daily. 30 tablet 0   FIBER PO Take 3 tablets by mouth daily.      glimepiride (AMARYL) 1 MG tablet Take 0.5 mg by mouth 2 (two) times daily.     HYDROcodone-acetaminophen (NORCO/VICODIN) 5-325 MG tablet Take 1 tablet by mouth every 4 (four) hours as needed for up to 5 days. 10 tablet 0   montelukast (SINGULAIR) 10 MG tablet TAKE 1 TABLET BY MOUTH EVERY DAY (Patient taking differently: Take 10 mg by mouth at bedtime.) 90 tablet 3   Multiple Vitamin (MULTIVITAMIN WITH MINERALS) TABS Take 1 tablet by mouth daily.     Omega-3 Fatty Acids (FISH OIL PO) Take 1,000 mg by mouth 2 (two) times daily.     omeprazole (PRILOSEC) 40 MG capsule TAKE 1 CAPSULE BY MOUTH EVERY DAY IN THE MORNING BEFORE BREAKFAST (Patient taking differently: Take 40 mg by mouth daily.) 30 capsule 0   ondansetron (ZOFRAN) 4 MG tablet Take 1 tablet (4 mg total) by mouth daily as needed for nausea or vomiting. 30 tablet 1   ondansetron  (ZOFRAN-ODT) 4 MG disintegrating tablet Take 1 tablet (4 mg total) by mouth every 8 (eight) hours as needed for nausea or vomiting. 20 tablet 0   OZEMPIC, 1 MG/DOSE, 4 MG/3ML SOPN Inject 2 mg into the skin once a week. Tuesday     phenazopyridine (PYRIDIUM) 100 MG tablet Take 1 tablet (100 mg total) by mouth 3 (three) times daily as needed (dysuria). 10 tablet 0   tamsulosin (FLOMAX) 0.4 MG CAPS capsule Take 1 capsule (0.4 mg total) by mouth daily after supper. 30 capsule 0   triamcinolone (NASACORT) 55 MCG/ACT AERO nasal inhaler Place 2 sprays into the nose daily as needed (allergies).     Vitamin D, Ergocalciferol, (DRISDOL) 50000 UNITS CAPS capsule Take 50,000 Units by mouth every 30 (thirty) days.      No current facility-administered medications for this visit.     Review of Systems    She denies chest pain, palpitations, dyspnea, pnd, orthopnea, n, v, dizziness, syncope, edema, weight gain, or early satiety. All other systems reviewed and are otherwise negative except as noted above.   Physical Exam    VS:  BP 128/70   Pulse 84   Ht 5\' 2"  (1.575 m)   Wt 190 lb 6.4 oz (86.4 kg)   SpO2 96%   BMI 34.82 kg/m  GEN: Well nourished, well developed, in no acute distress. HEENT: normal. Neck: Supple, no JVD, carotid bruits, or masses. Cardiac: RRR, no murmurs, rubs, or gallops. No clubbing, cyanosis, edema.  Radials/DP/PT 2+ and equal bilaterally. R radial cath site without bruising, bleeding, or hematoma.  Respiratory:  Respirations regular and unlabored, clear to auscultation bilaterally. GI: Soft, nontender, nondistended, BS + x 4. MS: no deformity or atrophy. Skin: warm and dry, no rash. Neuro:  Strength and sensation are intact. Psych: Normal affect.  Accessory Clinical Findings    ECG personally reviewed by me today -NSR, 84 bpm, nonspecific T wave abnormality, prolonged QT- no acute changes.   Lab Results  Component Value Date   WBC 6.7 12/24/2021   HGB 13.9 12/24/2021  HCT 40.8 12/24/2021   MCV 90.5 12/24/2021   PLT 310 12/24/2021   Lab Results  Component Value Date   CREATININE 1.09 (H) 12/24/2021   BUN 16 12/24/2021   NA 135 12/24/2021   K 3.8 12/24/2021   CL 103 12/24/2021   CO2 24 12/24/2021   Lab Results  Component Value Date   ALT 31 12/24/2021   AST 23 12/24/2021   ALKPHOS 39 12/24/2021   BILITOT 0.7 12/24/2021   Lab Results  Component Value Date   CHOL 125 12/07/2021   HDL 38 (L) 12/07/2021   LDLCALC 59 12/07/2021   TRIG 138 12/07/2021   CHOLHDL 3.3 12/07/2021    Lab Results  Component Value Date   HGBA1C 6.9 (H) 12/06/2021    Assessment & Plan    1. Elevated troponin (NSTEMI)/stress cardiomyopathy: Cath in 12/2021 revealed normal coronary arteries.  Echocardiogram revealed EF 50 to 55%, low normal LV function, hypokinesis of the anterolateral, inferior, and posterior walls, G1 DD.  Findings were consistent with stress cardiomyopathy.  She does note some generalized fatigue, otherwise stable with no anginal symptoms. Euvolemic and well compensated on exam.  Cardiac MRI is scheduled for 01/09/2022.    2. Prolonged QT interval: Still prolonged but stable on today's EKG.  Discussed possible discontinuation of Celexa, limited use of Zofran.  Continue to monitor.  3. Hypertension: BP well controlled. Continue current antihypertensive regimen.   4. Hyperlipidemia: LDL was 59 in 12/2021.  Monitored and managed per PCP.  Continue Lipitor.  5. Type 2 diabetes: A1C was 6.9 in 11/2021.  Monitored and managed per PCP.  6. OSA: She is awaiting the arrival of her new CPAP machine.  Encouraged full adherence once machine arrives.  7. Renal calculi: Pending ureteroscopy with Dr. Berneice HeinrichManny.   8. AKI: Most recent creatinine was stable at 1.09 on 12/24/2021.  9. Preoperative cardiac exam: According to the Revised Cardiac Risk Index (RCRI), her Perioperative Risk of Major Cardiac Event is (%): 0.4. Her Functional Capacity in METs is: 5.07 according to  the [facility] Activity Status Index (DASI). Therefore, based on ACC/AHA guidelines, patient would be at acceptable risk for the planned procedure without further cardiovascular testing. I will route this recommendation to the requesting party via Epic fax function.  10. Disposition: Follow-up in 3 months.      Joylene GrapesEmily C Lauree Yurick, NP 12/26/2021, 4:32 PM

## 2021-12-26 NOTE — Patient Instructions (Signed)
Medication Instructions:  Your physician recommends that you continue on your current medications as directed. Please refer to the Current Medication list given to you today.   *If you need a refill on your cardiac medications before your next appointment, please call your pharmacy*   Lab Work: NONE ordered at this time of appointment   If you have labs (blood work) drawn today and your tests are completely normal, you will receive your results only by: [place] (if you have MyChart) OR A paper copy in the mail If you have any lab test that is abnormal or we need to change your treatment, we will call you to review the results.   Testing/Procedures: NONE ordered at this time of appointment     Follow-Up: At [facility], you and your health needs are our priority.  As part of our continuing mission to provide you with exceptional heart care, we have created designated Provider Care Teams.  These Care Teams include your primary Cardiologist (physician) and Advanced Practice Providers (APPs -  Physician Assistants and Nurse Practitioners) who all work together to provide you with the care you need, when you need it.  We recommend signing up for the patient portal called "MyChart".  Sign up information is provided on this After Visit Summary.  MyChart is used to connect with patients for Virtual Visits (Telemedicine).  Patients are able to view lab/test results, encounter notes, upcoming appointments, etc.  Non-urgent messages can be sent to your provider as well.   To learn more about what you can do with MyChart, go to [email or web address].    Your next appointment:   3 month(s)  The format for your next appointment:   In Person  Provider:   Berniece Salines, DO  or Diona Browner, NP        Other Instructions   Important Information About Sugar

## 2021-12-27 ENCOUNTER — Encounter (HOSPITAL_BASED_OUTPATIENT_CLINIC_OR_DEPARTMENT_OTHER): Payer: Self-pay | Admitting: Urology

## 2021-12-27 NOTE — Progress Notes (Addendum)
Addendum:  Reviewed chart w/ anesthesia, Dr Christella Hartigan MDA.  Stated pt ok to proceed.   Spoke w/ via phone for pre-op interview--- pt Lab needs dos---- no              Lab results------ pt has current lab results in epic dated 12-24-2021  CBCdiff/ CMP;  current ekg in epic/ chart COVID test -----patient states asymptomatic no test needed Arrive at ------- 0800 on 01-02-2022 NPO after MN NO Solid Food.  Clear liquids from MN until--- 0700 Med rec completed Medications to take morning of surgery ----- norvasc prilosec Diabetic medication ----- do not take amaryl morning of surgery Patient instructed no nail polish to be worn day of surgery Patient instructed to bring photo id and insurance card day of surgery Patient aware to have Driver (ride ) / caregiver  for 24 hours after surgery -- husband, [place] Patient Special Instructions ----- pt last does ozempic 12-26-2021, aware not to do morning of surgery Pre-Op special Istructions ----- pt has cardiac office note clearance by Diona Browner NP dated 12-26-2021 in epic/ chart Patient verbalized understanding of instructions that were given at this phone interview. Patient denies shortness of breath, chest pain, fever, cough at this phone interview.    Anesthesia Review:  HTN;  12-06-2021 dx elevated troponin's w/ NSTEMI and stress cardiomyopathy (ef 50-55%), per cath normal coronaries; DM2;  mild Asthma (stated last flare up >65yr);  OSA pt stated retested recently and waiting for cpap  Pt denies cardiac s&s.  PCP:  Dr Presley Raddle  Cardiologist : Diona Browner NP (lov 12-26-2021) Chest x-ray : 12-24-2021 EKG : 12-26-2021 Echo : 12-07-2021 Stress test: no Cardiac Cath : 12-07-2021 epic Activity level: denies sob w/ any activity Sleep Study/ CPAP : Yes/ wait on cpap Fasting Blood Sugar :   155-170   / Checks Blood Sugar -- times a day:  daily in am Blood Thinner/ Instructions /Last Dose: no ASA / Instructions/ Last Dose :  no

## 2021-12-31 ENCOUNTER — Emergency Department (HOSPITAL_COMMUNITY): Payer: Medicare PPO

## 2021-12-31 ENCOUNTER — Encounter (HOSPITAL_COMMUNITY): Payer: Self-pay

## 2021-12-31 ENCOUNTER — Emergency Department (HOSPITAL_COMMUNITY)
Admission: EM | Admit: 2021-12-31 | Discharge: 2021-12-31 | Disposition: A | Discharge status: Home / Self Care | Payer: Medicare PPO | Attending: Emergency Medicine | Admitting: Emergency Medicine

## 2021-12-31 ENCOUNTER — Other Ambulatory Visit: Payer: Self-pay

## 2021-12-31 DIAGNOSIS — Z7984 Long term (current) use of oral hypoglycemic drugs: Secondary | ICD-10-CM | POA: Diagnosis not present

## 2021-12-31 DIAGNOSIS — I10 Essential (primary) hypertension: Secondary | ICD-10-CM | POA: Diagnosis not present

## 2021-12-31 DIAGNOSIS — Z79899 Other long term (current) drug therapy: Secondary | ICD-10-CM | POA: Diagnosis not present

## 2021-12-31 DIAGNOSIS — N200 Calculus of kidney: Secondary | ICD-10-CM | POA: Diagnosis not present

## 2021-12-31 DIAGNOSIS — R1032 Left lower quadrant pain: Secondary | ICD-10-CM | POA: Diagnosis not present

## 2021-12-31 DIAGNOSIS — R109 Unspecified abdominal pain: Secondary | ICD-10-CM | POA: Diagnosis not present

## 2021-12-31 DIAGNOSIS — E119 Type 2 diabetes mellitus without complications: Secondary | ICD-10-CM | POA: Insufficient documentation

## 2021-12-31 DIAGNOSIS — R112 Nausea with vomiting, unspecified: Secondary | ICD-10-CM | POA: Insufficient documentation

## 2021-12-31 DIAGNOSIS — Z91040 Latex allergy status: Secondary | ICD-10-CM | POA: Diagnosis not present

## 2021-12-31 DIAGNOSIS — R1031 Right lower quadrant pain: Secondary | ICD-10-CM | POA: Diagnosis not present

## 2021-12-31 DIAGNOSIS — M549 Dorsalgia, unspecified: Secondary | ICD-10-CM | POA: Diagnosis not present

## 2021-12-31 DIAGNOSIS — K760 Fatty (change of) liver, not elsewhere classified: Secondary | ICD-10-CM | POA: Diagnosis not present

## 2021-12-31 DIAGNOSIS — K449 Diaphragmatic hernia without obstruction or gangrene: Secondary | ICD-10-CM | POA: Diagnosis not present

## 2021-12-31 DIAGNOSIS — K429 Umbilical hernia without obstruction or gangrene: Secondary | ICD-10-CM | POA: Diagnosis not present

## 2021-12-31 DIAGNOSIS — R079 Chest pain, unspecified: Secondary | ICD-10-CM | POA: Diagnosis not present

## 2021-12-31 HISTORY — DX: Disorder of kidney and ureter, unspecified: N28.9

## 2021-12-31 LAB — URINALYSIS, ROUTINE W REFLEX MICROSCOPIC
Bilirubin Urine: NEGATIVE
Glucose, UA: 50 mg/dL — AB
Ketones, ur: 5 mg/dL — AB
Nitrite: NEGATIVE
Protein, ur: >=300 mg/dL — AB
RBC / HPF: >50 RBC/hpf — ABNORMAL HIGH (ref 0–5)
Specific Gravity, Urine: 1.017 (ref 1.005–1.030)
pH: 6 (ref 5.0–8.0)

## 2021-12-31 LAB — CBC WITH DIFFERENTIAL/PLATELET
Abs Immature Granulocytes: 0.02 10*3/uL (ref 0.00–0.07)
Basophils Absolute: 0 10*3/uL (ref 0.0–0.1)
Basophils Relative: 1 %
Eosinophils Absolute: 0.1 10*3/uL (ref 0.0–0.5)
Eosinophils Relative: 1 %
HCT: 38.7 % (ref 36.0–46.0)
Hemoglobin: 13.1 g/dL (ref 12.0–15.0)
Immature Granulocytes: 0 %
Lymphocytes Relative: 13 %
Lymphs Abs: 1 10*3/uL (ref 0.7–4.0)
MCH: 31.1 pg (ref 26.0–34.0)
MCHC: 33.9 g/dL (ref 30.0–36.0)
MCV: 91.9 fL (ref 80.0–100.0)
Monocytes Absolute: 0.5 10*3/uL (ref 0.1–1.0)
Monocytes Relative: 6 %
Neutro Abs: 6 10*3/uL (ref 1.7–7.7)
Neutrophils Relative %: 79 %
Platelets: 355 10*3/uL (ref 150–400)
RBC: 4.21 MIL/uL (ref 3.87–5.11)
RDW: 12.6 % (ref 11.5–15.5)
WBC: 7.5 10*3/uL (ref 4.0–10.5)
nRBC: 0 % (ref 0.0–0.2)

## 2021-12-31 LAB — COMPREHENSIVE METABOLIC PANEL
ALT: 25 U/L (ref 0–44)
AST: 20 U/L (ref 15–41)
Albumin: 3.9 g/dL (ref 3.5–5.0)
Alkaline Phosphatase: 40 U/L (ref 38–126)
Anion gap: 11 (ref 5–15)
BUN: 13 mg/dL (ref 8–23)
CO2: 23 mmol/L (ref 22–32)
Calcium: 9.9 mg/dL (ref 8.9–10.3)
Chloride: 104 mmol/L (ref 98–111)
Creatinine, Ser: 1.18 mg/dL — ABNORMAL HIGH (ref 0.44–1.00)
GFR, Estimated: 51 mL/min — ABNORMAL LOW (ref >60)
Glucose, Bld: 184 mg/dL — ABNORMAL HIGH (ref 70–99)
Potassium: 3.4 mmol/L — ABNORMAL LOW (ref 3.5–5.1)
Sodium: 138 mmol/L (ref 135–145)
Total Bilirubin: 0.9 mg/dL (ref 0.3–1.2)
Total Protein: 6.9 g/dL (ref 6.5–8.1)

## 2021-12-31 LAB — TROPONIN I (HIGH SENSITIVITY)
Troponin I (High Sensitivity): 37 ng/L — ABNORMAL HIGH (ref <18)
Troponin I (High Sensitivity): 40 ng/L — ABNORMAL HIGH (ref <18)

## 2021-12-31 MED ORDER — HYDROMORPHONE HCL 1 MG/ML IJ SOLN
0.5000 mg | Freq: Once | INTRAMUSCULAR | Status: AC
  Administered 2021-12-31: 0.5 mg via INTRAVENOUS
  Filled 2021-12-31: qty 1

## 2021-12-31 MED ORDER — OXYBUTYNIN CHLORIDE 5 MG PO TABS: 5.0000 mg | ORAL_TABLET | Freq: Three times a day (TID) | ORAL | 0 refills | Status: AC

## 2021-12-31 MED ORDER — SODIUM CHLORIDE 0.9 % IV BOLUS
1000.0000 mL | Freq: Once | INTRAVENOUS | Status: AC
  Administered 2021-12-31: 1000 mL via INTRAVENOUS

## 2021-12-31 MED ORDER — ONDANSETRON HCL 4 MG/2ML IJ SOLN
4.0000 mg | Freq: Once | INTRAMUSCULAR | Status: AC
  Administered 2021-12-31: 4 mg via INTRAVENOUS
  Filled 2021-12-31: qty 2

## 2021-12-31 MED ORDER — HYDROMORPHONE HCL 1 MG/ML IJ SOLN
1.0000 mg | Freq: Once | INTRAMUSCULAR | Status: AC
  Administered 2021-12-31: 1 mg via INTRAVENOUS
  Filled 2021-12-31: qty 1

## 2021-12-31 NOTE — ED Provider Triage Note (Signed)
Emergency Medicine Provider Triage Evaluation Note  Jade Perez , a 66 y.o. female  was evaluated in triage.  Pt complains of bilateral lower abdominal pain with nausea and vomiting. She reports she was diagnosed with a kidney sone in late august and sees Dr. Tresa Moore with Urology. She had pain with her last kidney stone which caused her to have NSTEMI. Urology wanted her to come here for evaluation.   Review of Systems  Positive:  Negative:   Physical Exam  BP (!) 164/78 (BP Location: Left Arm)   Pulse 75   Temp 98.5 F (36.9 C) (Oral)   Resp 18   Ht 5\' 2"  (1.575 m)   Wt 83.9 kg   SpO2 100%   BMI 33.84 kg/m  Gen:   Awake, no distress   Resp:  Normal effort MSK:   Moves extremities without difficulty  Other:  Abdomen soft, nontender  Medical Decision Making  Medically screening exam initiated at 11:46 AM.  Appropriate orders placed.  [place] was informed that the remainder of the evaluation will be completed by another provider, this initial triage assessment does not replace that evaluation, and the importance of remaining in the ED until their evaluation is complete.     Sherrell Puller, PA-C 12/31/21 1149

## 2021-12-31 NOTE — ED Notes (Signed)
Pt given grahm crackers and a sprite

## 2021-12-31 NOTE — ED Notes (Signed)
Patient actively vomiting. Will hold on PO challenge at this time. Zofran has been administered.

## 2021-12-31 NOTE — Discharge Instructions (Signed)
Please continue taking Norco/Vicodin as prescribed.  Please continue taking Flomax and Pyridium Start taking Oxybutynin as prescribed.  You can try taking your zofran before meals to decrease risk of vomiting.   Please see Urologist at your upcoming procedure.

## 2021-12-31 NOTE — ED Provider Notes (Signed)
[place] DEPT Provider Note   CSN: [id number] Arrival date & time: 12/31/21  1045     History PMH: HTN, DM 2, recent NSTEMI, recent left-sided obstructive urolithiasis status post bilateral stent placement -She was admitted on August 31 for an NSTEMI as well as concern for kidney stones.  She had a catheterization without stent placement.  Urology was consulted during her admission and underwent cystoscopy with bilateral retrograde pyelogram and inserted bilateral ureteral stents on September 5.  She was discharged on September 9. Chief Complaint  Patient presents with   Flank Pain   Emesis   Back Pain    Jade Perez is a 66 y.o. female.  Patient presents with bilateral flank pain rating into her suprapubic region.  She says that she has had the symptoms ever since being hospitalized recently, but symptoms of gotten worse to the point where she is unable to tolerate p.o. at home.  She is scheduled to have a lithotripsy on September 27, but she has been unable to keep down her home pain medication or antinausea medication and was told to come to the emergency department.  She denies any chest pain, shortness of breath, dysuria, hematuria, difficulty urinating, fevers, chills.   Flank Pain Associated symptoms include abdominal pain.  Emesis Associated symptoms: abdominal pain   Back Pain Associated symptoms: abdominal pain        Home Medications Prior to Admission medications   Medication Sig Start Date End Date Taking? Authorizing Provider  oxybutynin (DITROPAN) 5 MG tablet Take 1 tablet (5 mg total) by mouth 3 (three) times daily for 7 days. 12/31/21 01/07/22 Yes Akaila Rambo, Adora Fridge, PA-C  ACCU-CHEK AVIVA PLUS test strip 1 each daily. 12/03/19   [provider]  acetaminophen (TYLENOL) 500 MG tablet Take 500 mg by mouth every 6 (six) hours as needed for mild pain.    [provider]  albuterol (PROAIR HFA) 108 (90 Base)  MCG/ACT inhaler Can inhale two puffs every four to six hours as needed for cough or wheeze. Patient taking differently: Inhale 1 puff into the lungs every 6 (six) hours as needed for shortness of breath. 06/08/20   Kozlow, Donnamarie Poag, MD  amLODipine (NORVASC) 5 MG tablet Take 1 tablet (5 mg total) by mouth daily. Patient taking differently: Take 5 mg by mouth daily. 12/16/21   Regalado, Belkys A, MD  atorvastatin (LIPITOR) 40 MG tablet Take 40 mg by mouth at bedtime.    [provider]  b complex vitamins tablet Take 1 tablet by mouth at bedtime.    [provider]  cetirizine (ZYRTEC) 10 MG tablet Take 10 mg by mouth daily. Patient not taking: Reported on 12/27/2021    [provider]  citalopram (CELEXA) 40 MG tablet Take 20 mg by mouth daily.    [provider]  diclofenac Sodium (VOLTAREN) 1 % GEL Apply 4 g topically 4 (four) times daily. Patient not taking: Reported on 12/27/2021 07/03/21   Landis Martins, DPM  fenofibrate (TRICOR) 145 MG tablet Take 145 mg by mouth daily. 09/22/19   [provider]  Ferrous Sulfate (SLOW FE PO) Take 1 tablet by mouth daily.    [provider]  fesoterodine (TOVIAZ) 4 MG TB24 tablet Take 1 tablet (4 mg total) by mouth daily. 12/15/21 01/14/22  Regalado, Belkys A, MD  FIBER PO Take 3 tablets by mouth daily.     [provider]  glimepiride (AMARYL) 1 MG tablet Take 0.5 mg  by mouth 2 (two) times daily. 02/15/19   [provider]  montelukast (SINGULAIR) 10 MG tablet TAKE 1 TABLET BY MOUTH EVERY DAY Patient taking differently: Take 10 mg by mouth at bedtime. 07/19/19   Kozlow, Donnamarie Poag, MD  Multiple Vitamin (MULTIVITAMIN WITH MINERALS) TABS Take 1 tablet by mouth daily.    [provider]  Omega-3 Fatty Acids (FISH OIL PO) Take 1,000 mg by mouth 2 (two) times daily.    [provider]  omeprazole (PRILOSEC) 40 MG capsule TAKE 1 CAPSULE BY MOUTH EVERY DAY IN THE MORNING BEFORE  BREAKFAST Patient taking differently: Take 40 mg by mouth daily. 07/30/21   Kozlow, Donnamarie Poag, MD  ondansetron (ZOFRAN) 4 MG tablet Take 1 tablet (4 mg total) by mouth daily as needed for nausea or vomiting. Patient not taking: Reported on 12/27/2021 12/15/21 12/15/22  Regalado, Jerald Kief A, MD  ondansetron (ZOFRAN-ODT) 4 MG disintegrating tablet Take 1 tablet (4 mg total) by mouth every 8 (eight) hours as needed for nausea or vomiting. 12/24/21   Sharyn Lull A, PA-C  OZEMPIC, 1 MG/DOSE, 4 MG/3ML SOPN Inject 2 mg into the skin once a week. Wednesday's 05/25/20   [provider]  phenazopyridine (PYRIDIUM) 100 MG tablet Take 1 tablet (100 mg total) by mouth 3 (three) times daily as needed (dysuria). 12/15/21   Regalado, Belkys A, MD  tamsulosin (FLOMAX) 0.4 MG CAPS capsule Take 1 capsule (0.4 mg total) by mouth daily after supper. Patient taking differently: Take 0.4 mg by mouth daily after supper. 12/15/21   Regalado, Belkys A, MD  triamcinolone (NASACORT) 55 MCG/ACT AERO nasal inhaler Place 2 sprays into the nose daily as needed (allergies). Patient not taking: Reported on 12/27/2021    [provider]  Vitamin D, Ergocalciferol, (DRISDOL) 50000 UNITS CAPS capsule Take 50,000 Units by mouth every 30 (thirty) days.     [provider]      Allergies    Amoxicillin, Aspirin, Bee venom, Clarithromycin, Erythromycin, Ibuprofen, Oxycodone, Sulfa antibiotics, Latex, and Tape    Review of Systems   Review of Systems  Gastrointestinal:  Positive for abdominal pain, nausea and vomiting.  Genitourinary:  Positive for flank pain.  Musculoskeletal:  Positive for back pain.  All other systems reviewed and are negative.   Physical Exam Updated Vital Signs BP 136/77 (BP Location: Right Arm)   Pulse 85   Temp 98 F (36.7 C) (Oral)   Resp 17   Ht 5\' 2"  (1.575 m)   Wt 83.9 kg   SpO2 96%   BMI 33.84 kg/m  Physical Exam Vitals and nursing note reviewed.  Constitutional:      General:  She is not in acute distress.    Appearance: Normal appearance. She is well-developed. She is not ill-appearing, toxic-appearing or diaphoretic.  HENT:     Head: Normocephalic and atraumatic.     Nose: No nasal deformity.     Mouth/Throat:     Lips: Pink. No lesions.  Eyes:     General: Gaze aligned appropriately. No scleral icterus.       Right eye: No discharge.        Left eye: No discharge.     Conjunctiva/sclera: Conjunctivae normal.     Right eye: Right conjunctiva is not injected. No exudate or hemorrhage.    Left eye: Left conjunctiva is not injected. No exudate or hemorrhage. Cardiovascular:     Rate and Rhythm: Normal rate and regular rhythm.     Pulses: Normal  pulses.     Heart sounds: Normal heart sounds. No murmur heard.    No friction rub. No gallop.  Pulmonary:     Effort: Pulmonary effort is normal. No respiratory distress.     Breath sounds: Normal breath sounds. No stridor. No wheezing, rhonchi or rales.  Abdominal:     General: Abdomen is flat. There is no distension.     Palpations: Abdomen is soft.     Tenderness: There is abdominal tenderness. There is right CVA tenderness and left CVA tenderness. There is no guarding or rebound.     Comments: Moderate suprapubic tenderness  Musculoskeletal:     Right lower leg: No edema.     Left lower leg: No edema.  Skin:    General: Skin is warm and dry.  Neurological:     Mental Status: She is alert and oriented to person, place, and time.  Psychiatric:        Mood and Affect: Mood normal.        Speech: Speech normal.        Behavior: Behavior normal. Behavior is cooperative.     ED Results / Procedures / Treatments   Labs (all labs ordered are listed, but only abnormal results are displayed) Labs Reviewed  COMPREHENSIVE METABOLIC PANEL - Abnormal; Notable for the following components:      Result Value   Potassium 3.4 (*)    Glucose, Bld 184 (*)    Creatinine, Ser 1.18 (*)    GFR, Estimated 51 (*)    All  other components within normal limits  URINALYSIS, ROUTINE W REFLEX MICROSCOPIC - Abnormal; Notable for the following components:   Color, Urine RED (*)    APPearance CLOUDY (*)    Glucose, UA 50 (*)    Hgb urine dipstick LARGE (*)    Ketones, ur 5 (*)    Protein, ur >=300 (*)    Leukocytes,Ua MODERATE (*)    RBC / HPF >50 (*)    Bacteria, UA RARE (*)    All other components within normal limits  TROPONIN I (HIGH SENSITIVITY) - Abnormal; Notable for the following components:   Troponin I (High Sensitivity) 37 (*)    All other components within normal limits  TROPONIN I (HIGH SENSITIVITY) - Abnormal; Notable for the following components:   Troponin I (High Sensitivity) 40 (*)    All other components within normal limits  CBC WITH DIFFERENTIAL/PLATELET    EKG None  Radiology CT Renal Stone Study  Result Date: 12/31/2021 CLINICAL DATA:  Flank pain, kidney stone suspected EXAM: CT ABDOMEN AND PELVIS WITHOUT CONTRAST TECHNIQUE: Multidetector CT imaging of the abdomen and pelvis was performed following the standard protocol without IV contrast. RADIATION DOSE REDUCTION: This exam was performed according to the departmental dose-optimization program which includes automated exposure control, adjustment of the mA and/or kV according to patient size and/or use of iterative reconstruction technique. COMPARISON:  December 24, 2021 FINDINGS: Evaluation is limited by lack of IV contrast. Lower chest: No acute abnormality. Hepatobiliary: Hepatic steatosis. Unremarkable noncontrast appearance of the gallbladder. Pancreas: No peripancreatic fat stranding. Spleen: Unremarkable. Adrenals/Urinary Tract: Adrenal glands are unremarkable. Patient is status post bilateral nephroureteral stent placement. There is a mildly decreased degree of fat stranding adjacent to the course of bilateral ureters. No new hydronephrosis. Bilateral nonobstructing nephrolithiasis, similar in comparison to prior. Again, it is  noted that the 7 mm nephrolithiasis of the LEFT kidney remains within the entrance to the inferior calyx and no longer  obstructs at the UPJ. Bladder is decompressed and unremarkable. Stomach/Bowel: No evidence of bowel obstruction. Scattered diverticulosis. Status post appendectomy. Moderate hiatal hernia. Vascular/Lymphatic: No significant vascular findings are present. No enlarged abdominal or pelvic lymph nodes. Reproductive: Uterus and bilateral adnexa are unremarkable. Other: Small periumbilical fat containing hernia. Musculoskeletal: Degenerative changes of the lumbar spine with focal levocurvature centered at L2-3. IMPRESSION: 1. Stable positioning of bilateral nephroureteral stents without evidence of new obstructing nephrolithiasis. No new hydronephrosis. 2. Mildly decreased fat stranding adjacent to the course of bilateral nephroureteral stents in comparison to prior. Electronically Signed   By: Valentino Saxon M.D.   On: 12/31/2021 13:39   DG Chest 2 View  Result Date: 12/31/2021 CLINICAL DATA:  Chest pain. EXAM: CHEST - 2 VIEW COMPARISON:  December 24, 2021. FINDINGS: The heart size and mediastinal contours are within normal limits. Both lungs are clear. The visualized skeletal structures are unremarkable. IMPRESSION: No active cardiopulmonary disease. Electronically Signed   By: Marijo Conception M.D.   On: 12/31/2021 12:17    Procedures Procedures  This patient was on telemetry or cardiac monitoring during their time in the ED.    Medications Ordered in ED Medications  HYDROmorphone (DILAUDID) injection 1 mg (1 mg Intravenous Given 12/31/21 1653)  ondansetron (ZOFRAN) injection 4 mg (4 mg Intravenous Given 12/31/21 1653)  sodium chloride 0.9 % bolus 1,000 mL (0 mLs Intravenous Stopped 12/31/21 2048)  HYDROmorphone (DILAUDID) injection 0.5 mg (0.5 mg Intravenous Given 12/31/21 2043)    ED Course/ Medical Decision Making/ A&P Clinical Course as of 12/31/21 2112  [facility] Dec 31, 2021  1804  Patient with recent bilateral stent placement. Worsening bilateral flank pain today to the point of refractory n/v at home despite home pain regimen. She was sent here by urology today. CT stone study seems stable without hydro. Planned lithotripsy in 2 days.  [GL]  1936 I discussed case with Dr. Kathrynn Ducking. She recommends trial of Oxybutynin for bladder spasm as well as pain and nausea control until her procedure in two days.  [GL]    Clinical Course User Index [GL] Adolphus Birchwood, PA-C                           Medical Decision Making Risk Prescription drug management.    MDM  This is a 65 y.o. female who presents to the ED with bilateral flank pain, nausea, and vomiting that has worsened since recent bilateral ureteral stent placement. The differential of this patient includes but is not limited to Pyelonephritis, Hydronephrosis, Stent complication, pain induced nausea/vomiting.  Initial Impression  Well appearing, no acute distress Vitals are stable.  I personally ordered, reviewed, and interpreted all laboratory work and imaging and agree with radiologist interpretation. Results interpreted below:  No leukocytosis is noted.  Creatinine 1.18 which is stable from prior.  FTs are normal.  Potassium is 3.4.  No other electrolyte abnormalities.  Troponin is 37 which is comparable to recent values.  Repeat pending. Urinalysis reveals large amount of RBCs as well as moderate leukocytes.  Only 6-10 white blood cells.  EKG - NSR, nonspecific changes Chest XR - no acute abnormalities CT stone study 1. Stable positioning of bilateral nephroureteral stents without  evidence of new obstructing nephrolithiasis. No new hydronephrosis.  2. Mildly decreased fat stranding adjacent to the course of  bilateral nephroureteral stents in comparison to prior.    Assessment/Plan:  Patient with recent bilateral ureteral stent placement here  with continued and worsening bilateral flank pain rating  into her suprapubic region and secondary nausea and vomiting.  Her vitals are stable here.  Her work-up today was not concerning for urinary tract infection.  There is no new obstructing nephrolithiasis or hydronephrosis.  Stents are in appropriate place.  Troponins are flat and presentation is not consistent with MI. EKG reassuring. Renal function is stable and there is no leukocytosis.  Also treated with IV Dilaudid, IV fluids, and Zofran.  Did get some relief from this. I discussed case with urologist, Dr. Kathrynn Ducking. There is no surgical intervention to do at this time, but she feels that this could be indicative of bladder spasms. Patient is already on flomax, but we have added Oxybutynin. Patient has been able to tolerate PO in the ED. She has follow up with Urology in two days for lithotripsy. Recommend discharge at this time.   Charting Requirements Additional history is obtained from:  Independent historian External Records from outside source obtained and reviewed including:  Recent Discharge summary, recent surgery note Social Determinants of Health:  none Pertinant PMH that complicates patient's illness: Recent bilateral stent  Patient Care Problems that were addressed during this visit: - Bilateral Flank pain: Acute illness with systemic symptoms This patient was maintained on a cardiac monitor/telemetry. I personally viewed and interpreted the cardiac monitor which reveals an underlying rhythm of NSR Medications given in ED: Dilaudid, Zofran, IVF Reevaluation of the patient after these medicines showed that the patient improved I have reviewed home medications and made changes accordingly.  Critical Care Interventions: n/a Consultations: Urology Disposition: discharge  This is a supervised visit with my attending physician, Dr. Tomi Bamberger. We have discussed this patient and they have altered the plan as needed.  Portions of this note were generated with [employer].  Dictation errors may occur despite best attempts at proofreading.    Final Clinical Impression(s) / ED Diagnoses Final diagnoses:  Bilateral flank pain    Rx / DC Orders ED Discharge Orders          Ordered    oxybutynin (DITROPAN) 5 MG tablet  3 times daily        12/31/21 1944              Laquenta Whitsell, Cherlyn Roberts 12/31/21 2112    Dorie Rank, MD 01/01/22 [id number]

## 2021-12-31 NOTE — ED Notes (Signed)
Patient given water

## 2021-12-31 NOTE — ED Triage Notes (Signed)
Patient reports that she began having bilateral right and left flank and lower back pain since this AM. Patient states she has known kidney stones on both sides.  Patient states she is schedule for a lithotripsy 01/02/22.

## 2022-01-02 ENCOUNTER — Ambulatory Visit (HOSPITAL_BASED_OUTPATIENT_CLINIC_OR_DEPARTMENT_OTHER): Payer: Medicare PPO | Admitting: Anesthesiology

## 2022-01-02 ENCOUNTER — Encounter (HOSPITAL_BASED_OUTPATIENT_CLINIC_OR_DEPARTMENT_OTHER)
Admission: RE | Disposition: A | Discharge status: Home / Self Care | Payer: Self-pay | Source: Home / Self Care | Attending: Urology

## 2022-01-02 ENCOUNTER — Encounter (HOSPITAL_BASED_OUTPATIENT_CLINIC_OR_DEPARTMENT_OTHER): Payer: Self-pay | Admitting: Urology

## 2022-01-02 ENCOUNTER — Ambulatory Visit (HOSPITAL_BASED_OUTPATIENT_CLINIC_OR_DEPARTMENT_OTHER)
Admission: RE | Admit: 2022-01-02 | Discharge: 2022-01-02 | Disposition: A | Discharge status: Home / Self Care | Payer: Medicare PPO | Source: Home / Self Care | Attending: Urology | Admitting: Urology

## 2022-01-02 DIAGNOSIS — Z7984 Long term (current) use of oral hypoglycemic drugs: Secondary | ICD-10-CM | POA: Diagnosis not present

## 2022-01-02 DIAGNOSIS — E119 Type 2 diabetes mellitus without complications: Secondary | ICD-10-CM | POA: Insufficient documentation

## 2022-01-02 DIAGNOSIS — N200 Calculus of kidney: Secondary | ICD-10-CM | POA: Diagnosis not present

## 2022-01-02 DIAGNOSIS — F32A Depression, unspecified: Secondary | ICD-10-CM | POA: Diagnosis present

## 2022-01-02 DIAGNOSIS — Z79890 Hormone replacement therapy (postmenopausal): Secondary | ICD-10-CM | POA: Diagnosis not present

## 2022-01-02 DIAGNOSIS — N202 Calculus of kidney with calculus of ureter: Secondary | ICD-10-CM | POA: Diagnosis present

## 2022-01-02 DIAGNOSIS — I252 Old myocardial infarction: Secondary | ICD-10-CM

## 2022-01-02 DIAGNOSIS — F418 Other specified anxiety disorders: Secondary | ICD-10-CM | POA: Insufficient documentation

## 2022-01-02 DIAGNOSIS — K449 Diaphragmatic hernia without obstruction or gangrene: Secondary | ICD-10-CM | POA: Diagnosis not present

## 2022-01-02 DIAGNOSIS — E782 Mixed hyperlipidemia: Secondary | ICD-10-CM | POA: Diagnosis present

## 2022-01-02 DIAGNOSIS — F411 Generalized anxiety disorder: Secondary | ICD-10-CM | POA: Diagnosis present

## 2022-01-02 DIAGNOSIS — I10 Essential (primary) hypertension: Secondary | ICD-10-CM | POA: Diagnosis present

## 2022-01-02 DIAGNOSIS — N390 Urinary tract infection, site not specified: Secondary | ICD-10-CM | POA: Diagnosis present

## 2022-01-02 DIAGNOSIS — G8929 Other chronic pain: Secondary | ICD-10-CM | POA: Diagnosis present

## 2022-01-02 DIAGNOSIS — R112 Nausea with vomiting, unspecified: Secondary | ICD-10-CM | POA: Diagnosis not present

## 2022-01-02 DIAGNOSIS — E876 Hypokalemia: Secondary | ICD-10-CM | POA: Diagnosis present

## 2022-01-02 DIAGNOSIS — J4520 Mild intermittent asthma, uncomplicated: Secondary | ICD-10-CM | POA: Diagnosis present

## 2022-01-02 DIAGNOSIS — J449 Chronic obstructive pulmonary disease, unspecified: Secondary | ICD-10-CM | POA: Diagnosis not present

## 2022-01-02 DIAGNOSIS — I2510 Atherosclerotic heart disease of native coronary artery without angina pectoris: Secondary | ICD-10-CM | POA: Diagnosis present

## 2022-01-02 DIAGNOSIS — K219 Gastro-esophageal reflux disease without esophagitis: Secondary | ICD-10-CM | POA: Insufficient documentation

## 2022-01-02 DIAGNOSIS — N179 Acute kidney failure, unspecified: Secondary | ICD-10-CM | POA: Diagnosis not present

## 2022-01-02 DIAGNOSIS — E669 Obesity, unspecified: Secondary | ICD-10-CM | POA: Insufficient documentation

## 2022-01-02 DIAGNOSIS — Z79891 Long term (current) use of opiate analgesic: Secondary | ICD-10-CM | POA: Diagnosis not present

## 2022-01-02 DIAGNOSIS — Z01818 Encounter for other preprocedural examination: Secondary | ICD-10-CM

## 2022-01-02 DIAGNOSIS — E860 Dehydration: Secondary | ICD-10-CM | POA: Diagnosis not present

## 2022-01-02 DIAGNOSIS — Z7985 Long-term (current) use of injectable non-insulin antidiabetic drugs: Secondary | ICD-10-CM | POA: Diagnosis not present

## 2022-01-02 DIAGNOSIS — K5730 Diverticulosis of large intestine without perforation or abscess without bleeding: Secondary | ICD-10-CM | POA: Diagnosis not present

## 2022-01-02 DIAGNOSIS — J309 Allergic rhinitis, unspecified: Secondary | ICD-10-CM | POA: Diagnosis present

## 2022-01-02 DIAGNOSIS — G4733 Obstructive sleep apnea (adult) (pediatric): Secondary | ICD-10-CM | POA: Diagnosis present

## 2022-01-02 DIAGNOSIS — R1110 Vomiting, unspecified: Secondary | ICD-10-CM | POA: Diagnosis not present

## 2022-01-02 DIAGNOSIS — Z87442 Personal history of urinary calculi: Secondary | ICD-10-CM | POA: Diagnosis not present

## 2022-01-02 HISTORY — DX: Mild intermittent asthma, uncomplicated: J45.20

## 2022-01-02 HISTORY — DX: Personal history of other diseases of the circulatory system: Z86.79

## 2022-01-02 HISTORY — PX: HOLMIUM LASER APPLICATION: SHX5852

## 2022-01-02 HISTORY — DX: Presence of spectacles and contact lenses: Z97.3

## 2022-01-02 HISTORY — DX: Generalized anxiety disorder: F41.1

## 2022-01-02 HISTORY — DX: Unspecified osteoarthritis, unspecified site: M19.90

## 2022-01-02 HISTORY — DX: Allergic rhinitis, unspecified: J30.9

## 2022-01-02 HISTORY — PX: CYSTOSCOPY WITH RETROGRADE PYELOGRAM, URETEROSCOPY AND STENT PLACEMENT: SHX5789

## 2022-01-02 HISTORY — DX: Iron deficiency anemia, unspecified: D50.9

## 2022-01-02 HISTORY — DX: Obstructive sleep apnea (adult) (pediatric): G47.33

## 2022-01-02 HISTORY — DX: Diaphragmatic hernia without obstruction or gangrene: K44.9

## 2022-01-02 HISTORY — DX: Mixed hyperlipidemia: E78.2

## 2022-01-02 LAB — GLUCOSE, CAPILLARY
Glucose-Capillary: 156 mg/dL — ABNORMAL HIGH (ref 70–99)
Glucose-Capillary: 156 mg/dL — ABNORMAL HIGH (ref 70–99)

## 2022-01-02 SURGERY — CYSTOURETEROSCOPY, WITH RETROGRADE PYELOGRAM AND STENT INSERTION
Anesthesia: General | Site: Ureter | Laterality: Bilateral

## 2022-01-02 MED ORDER — NITROFURANTOIN MONOHYD MACRO 100 MG PO CAPS: 100.0000 mg | ORAL_CAPSULE | Freq: Two times a day (BID) | ORAL | 0 refills | Status: DC

## 2022-01-02 MED ORDER — GLYCOPYRROLATE 0.2 MG/ML IJ SOLN
INTRAMUSCULAR | Status: DC | PRN
  Administered 2022-01-02: .2 mg via INTRAVENOUS

## 2022-01-02 MED ORDER — FENTANYL CITRATE (PF) 100 MCG/2ML IJ SOLN
INTRAMUSCULAR | Status: AC
  Filled 2022-01-02: qty 2

## 2022-01-02 MED ORDER — GENTAMICIN SULFATE 40 MG/ML IJ SOLN
5.0000 mg/kg | INTRAVENOUS | Status: AC
  Administered 2022-01-02: 323.2 mg via INTRAVENOUS
  Filled 2022-01-02: qty 8

## 2022-01-02 MED ORDER — SODIUM CHLORIDE 0.9 % IR SOLN
Status: DC | PRN
  Administered 2022-01-02: 3000 mL

## 2022-01-02 MED ORDER — HYDROMORPHONE HCL 1 MG/ML IJ SOLN: 0.2500 mg | INTRAMUSCULAR | Status: DC | PRN

## 2022-01-02 MED ORDER — ONDANSETRON 8 MG PO TBDP: 8.0000 mg | ORAL_TABLET | Freq: Three times a day (TID) | ORAL | 0 refills | Status: DC | PRN

## 2022-01-02 MED ORDER — HYDROCODONE-ACETAMINOPHEN 5-325 MG PO TABS: 1.0000 | ORAL_TABLET | Freq: Four times a day (QID) | ORAL | 0 refills | Status: DC | PRN

## 2022-01-02 MED ORDER — IOHEXOL 300 MG/ML  SOLN
INTRAMUSCULAR | Status: DC | PRN
  Administered 2022-01-02: 40 mL via URETHRAL

## 2022-01-02 MED ORDER — LACTATED RINGERS IV SOLN: INTRAVENOUS | Status: DC

## 2022-01-02 MED ORDER — ONDANSETRON HCL 4 MG/2ML IJ SOLN
INTRAMUSCULAR | Status: DC | PRN
  Administered 2022-01-02: 4 mg via INTRAVENOUS

## 2022-01-02 MED ORDER — KETOROLAC TROMETHAMINE 10 MG PO TABS: 10.0000 mg | ORAL_TABLET | Freq: Three times a day (TID) | ORAL | 0 refills | Status: DC | PRN

## 2022-01-02 MED ORDER — PHENYLEPHRINE 80 MCG/ML (10ML) SYRINGE FOR IV PUSH (FOR BLOOD PRESSURE SUPPORT)
PREFILLED_SYRINGE | INTRAVENOUS | Status: AC
  Filled 2022-01-02: qty 10

## 2022-01-02 MED ORDER — FENTANYL CITRATE (PF) 100 MCG/2ML IJ SOLN
INTRAMUSCULAR | Status: DC | PRN
  Administered 2022-01-02 (×3): 25 ug via INTRAVENOUS
  Administered 2022-01-02: 12.5 ug via INTRAVENOUS
  Administered 2022-01-02: 25 ug via INTRAVENOUS
  Administered 2022-01-02: 12.5 ug via INTRAVENOUS
  Administered 2022-01-02 (×3): 25 ug via INTRAVENOUS

## 2022-01-02 MED ORDER — DEXAMETHASONE SODIUM PHOSPHATE 10 MG/ML IJ SOLN
INTRAMUSCULAR | Status: AC
  Filled 2022-01-02: qty 1

## 2022-01-02 MED ORDER — PROPOFOL 10 MG/ML IV BOLUS
INTRAVENOUS | Status: DC | PRN
  Administered 2022-01-02: 30 mg via INTRAVENOUS
  Administered 2022-01-02: 200 mg via INTRAVENOUS

## 2022-01-02 MED ORDER — ONDANSETRON HCL 4 MG/2ML IJ SOLN
INTRAMUSCULAR | Status: AC
  Filled 2022-01-02: qty 2

## 2022-01-02 MED ORDER — LIDOCAINE HCL (PF) 2 % IJ SOLN
INTRAMUSCULAR | Status: AC
  Filled 2022-01-02: qty 5

## 2022-01-02 MED ORDER — PHENYLEPHRINE HCL (PRESSORS) 10 MG/ML IV SOLN
INTRAVENOUS | Status: DC | PRN
  Administered 2022-01-02: 80 ug via INTRAVENOUS

## 2022-01-02 MED ORDER — ACETAMINOPHEN 10 MG/ML IV SOLN
INTRAVENOUS | Status: AC
  Filled 2022-01-02: qty 100

## 2022-01-02 MED ORDER — PROPOFOL 10 MG/ML IV BOLUS
INTRAVENOUS | Status: AC
  Filled 2022-01-02: qty 20

## 2022-01-02 MED ORDER — PROMETHAZINE HCL 25 MG/ML IJ SOLN: 6.2500 mg | INTRAMUSCULAR | Status: DC | PRN

## 2022-01-02 MED ORDER — DEXAMETHASONE SODIUM PHOSPHATE 4 MG/ML IJ SOLN
INTRAMUSCULAR | Status: DC | PRN
  Administered 2022-01-02: 5 mg via INTRAVENOUS

## 2022-01-02 MED ORDER — LIDOCAINE HCL (CARDIAC) PF 100 MG/5ML IV SOSY
PREFILLED_SYRINGE | INTRAVENOUS | Status: DC | PRN
  Administered 2022-01-02: 60 mg via INTRAVENOUS

## 2022-01-02 MED ORDER — AMISULPRIDE (ANTIEMETIC) 5 MG/2ML IV SOLN: 10.0000 mg | Freq: Once | INTRAVENOUS | Status: DC | PRN

## 2022-01-02 MED ORDER — ACETAMINOPHEN 10 MG/ML IV SOLN
1000.0000 mg | Freq: Once | INTRAVENOUS | Status: AC
  Administered 2022-01-02: 1000 mg via INTRAVENOUS

## 2022-01-02 SURGICAL SUPPLY — 25 items
BAG DRAIN URO-CYSTO SKYTR STRL (DRAIN) ×1 IMPLANT
BAG DRN UROCATH (DRAIN) ×1
BASKET LASER NITINOL 1.9FR (BASKET) IMPLANT
BSKT STON RTRVL 120 1.9FR (BASKET) ×1
CATH URETL OPEN END 6FR 70 (CATHETERS) IMPLANT
CLOTH BEACON ORANGE TIMEOUT ST (SAFETY) ×1 IMPLANT
COVER DOME SNAP 22 D (MISCELLANEOUS) IMPLANT
GLOVE BIOGEL PI IND STRL 6.5 (GLOVE) IMPLANT
GLOVE SURG SS PI 6.5 STRL IVOR (GLOVE) IMPLANT
GLOVE SURG SS PI 7.5 STRL IVOR (GLOVE) IMPLANT
GOWN STRL REUS W/TWL LRG LVL3 (GOWN DISPOSABLE) ×1 IMPLANT
GUIDEWIRE ANG ZIPWIRE 038X150 (WIRE) ×1 IMPLANT
GUIDEWIRE STR DUAL SENSOR (WIRE) ×1 IMPLANT
IV NS IRRIG 3000ML ARTHROMATIC (IV SOLUTION) ×1 IMPLANT
KIT TURNOVER CYSTO (KITS) ×1 IMPLANT
MANIFOLD NEPTUNE II (INSTRUMENTS) ×1 IMPLANT
PACK CYSTO (CUSTOM PROCEDURE TRAY) ×1 IMPLANT
SHEATH URETERAL 12FRX28CM (UROLOGICAL SUPPLIES) IMPLANT
STENT POLARIS 5FRX24 (STENTS) IMPLANT
SYR 10ML LL (SYRINGE) ×1 IMPLANT
TRACTIP FLEXIVA PULS ID 200XHI (Laser) IMPLANT
TRACTIP FLEXIVA PULSE ID 200 (Laser) ×1
TUBE CONNECTING 12X1/4 (SUCTIONS) ×1 IMPLANT
TUBE FEEDING 8FR 16IN STR KANG (MISCELLANEOUS) IMPLANT
TUBING UROLOGY SET (TUBING) ×1 IMPLANT

## 2022-01-02 NOTE — H&P (Signed)
Jade Perez is an 66 y.o. female.    Chief Complaint: Pre-OP BILATERAL Ureteroscopic Stone Manipulation  HPI:   1 - Recurrent Urolithiasis: Pre 2023 - SWLx1, MET x 1 12/2021 - Left Ureteral, Bilateral Renal Stones - 79m left UPJ stone and scattered L>R renal tones on CT 11/2021 during admission for flank /abd pain. Sones initially non-obstrucitng, but repeat imaging 9/5 with new left obstruction ==> bilateral stents placed   PMH sig for obesity, CAD, DM2, chronic pain/anxiety. Her [place] B[facility]co [facility] [place]  Today "Jade Perez is seen to proceed with BILATERAL ureteroscopic stone manipulation. She has had significant stent colic / exacerbation of her chronic pain with severel interval imaging studies all showing stents in good position and no hydro. Most recent UA without infectious parameters.   Past Medical History:  Diagnosis Date   Allergic rhinitis    Asthma, mild intermittent    followed by pcp    (prevously followed by dr kNeldon Mcw/ asthma/ allergy center)   Complication of anesthesia    difficulty waking up   Depression    GAD (generalized anxiety disorder)    GERD (gastroesophageal reflux disease)    Hiatal hernia    History of cardiomyopathy in adulthood 12/06/2021   in setting elevated troponin's w/ NSTEMI;  per echo  12-07-2021  ef 50-55%   History of heart murmur in childhood    since birth very slight   History of kidney stones    History of non-ST elevation myocardial infarction (NSTEMI) 12/06/2021   admission in epic---  elevated troponin's w/ NSTEMI  and stress cardiomyopathy,  12-07-2021 per cath normal coronaries ;  cardiology hospital follow-up with EDiona BrownerNP 12-26-2021   Hyperlipidemia, mixed    Hypertension    IDA (iron deficiency anemia)    OA (osteoarthritis)    thumb and fingers, knee, right hip   OSA (obstructive sleep apnea)    12-27-2021  per pt OSA yrs ago last used cpap approx. 2013,  stated retested this year and is  waiting on cpap machine to arrive   Renal disorder    [place] 2 diabetes [place] 2008   followed by pcp   (12-27-2021  pt sated checks blood sugar am daily fasting, average 155-170)   Wears glasses     Past Surgical History:  Procedure Laterality Date   BREAST EXCISIONAL BIOPSY Right    x2  last one 1980s , benign   CARPAL TUNNEL RELEASE Right 05/22/2017   Procedure: RIGHT CARPAL TUNNEL RELEASE;  Surgeon: KDaryll Brod MD;  [facility]  Service: Orthopedics;  Laterality: Right;   CARPAL TUNNEL RELEASE Left 03/12/2018   Procedure: LEFT CARPAL TUNNEL RELEASE;  Surgeon: KDaryll Brod MD;  [place] [facility]  Service: Orthopedics;  Laterality: Left;   CYSTOSCOPY W/ URETERAL STENT PLACEMENT Bilateral 12/11/2021   Procedure: CYSTOSCOPY , BILATERAL RETROGRADE , BILATERAL STENT PLACEMENT;  Surgeon: MAlexis Frock MD;  [facility] [facility]  Service: Urology;  Laterality: Bilateral;   DIAGNOSTIC LAPAROSCOPY     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   DILATION AND CURETTAGE OF UTERUS     yrs ago for missed ab   EXTRACORPOREAL SHOCK WAVE LITHOTRIPSY  03/2017   LAPAROSCOPIC APPENDECTOMY  2008   LEFT HEART CATH AND CORONARY ANGIOGRAPHY N/A 12/07/2021   Procedure: LEFT HEART CATH AND CORONARY ANGIOGRAPHY;  Surgeon: SBelva Crome MD;  [facility];  Service: Cardiovascular;  Laterality: N/A;  LIPOMA EXCISION  2003   right shoulder   SCLERAL BUCKLE  10/15/2011   Procedure: SCLERAL BUCKLE;  Surgeon: Hayden Pedro, MD;  Location: [facility];  Service: Ophthalmology;  Laterality: Right;   SHOULDER ARTHROSCOPY WITH ROTATOR CUFF REPAIR Right 03/25/2019   Procedure: Right shoulder arthroscopy, subacromial decompression, distal clavicle resection, rotator cuff repair;  Surgeon: Justice Britain, MD;  Location: [facility];  Service: Orthopedics;  Laterality: Right;  143mn   WISDOM TOOTH EXTRACTION      Family History  Problem Relation Age of Onset   Unexplained death  Sister 631      died in her sleep   Colon cancer Paternal Grandmother    Colon cancer Paternal Uncle    Allergic rhinitis Neg Hx    Angioedema Neg Hx    Asthma Neg Hx    Atopy Neg Hx    Eczema Neg Hx    Immunodeficiency Neg Hx    Urticaria Neg Hx    Colon polyps Neg Hx    Esophageal cancer Neg Hx    Stomach cancer Neg Hx    Rectal cancer Neg Hx    Breast cancer Neg Hx    Social History:  reports that she has never smoked. She has never used smokeless tobacco. She reports that she does not drink alcohol and does not use drugs.  Allergies:  Allergies  Allergen Reactions   Amoxicillin Hives    Did it involve swelling of the face/tongue/throat, SOB, or low BP? No Did it involve sudden or severe rash/hives, skin peeling, or any reaction on the inside of your mouth or nose? No Did you need to seek medical attention at a hospital or doctor's office? No When did it last happen?      15-20 years ago If all above answers are "NO", may proceed with cephalosporin use.    Aspirin Swelling   Bee Venom    Clarithromycin     Gi upset   Erythromycin Nausea Only and Other (See Comments)    Stomach pain, also   Ibuprofen Swelling   Oxycodone Itching   Sulfa Antibiotics Hives   Latex Rash   Tape Rash and Other (See Comments)    Band-Aids, also = blisters    No medications prior to admission.    Results for orders placed or performed during the hospital encounter of 12/31/21 (from the past 48 hour(s))  CBC with Differential     Status: None   Collection Time: 12/31/21 12:42 PM  Result Value Ref Range   WBC 7.5 4.0 - 10.5 K/uL   RBC 4.21 3.87 - 5.11 MIL/uL   Hemoglobin 13.1 12.0 - 15.0 g/dL   HCT 38.7 36.0 - 46.0 %   MCV 91.9 80.0 - 100.0 fL   MCH 31.1 26.0 - 34.0 pg   MCHC 33.9 30.0 - 36.0 g/dL   RDW 12.6 11.5 - 15.5 %   Platelets 355 150 - 400 K/uL   nRBC 0.0 0.0 - 0.2 %   Neutrophils Relative % 79 %   Neutro Abs 6.0 1.7 - 7.7 K/uL   Lymphocytes Relative 13 %   Lymphs Abs  1.0 0.7 - 4.0 K/uL   Monocytes Relative 6 %   Monocytes Absolute 0.5 0.1 - 1.0 K/uL   Eosinophils Relative 1 %   Eosinophils Absolute 0.1 0.0 - 0.5 K/uL   Basophils Relative 1 %   Basophils Absolute 0.0 0.0 - 0.1 K/uL   Immature Granulocytes 0 %   Abs  Immature Granulocytes 0.02 0.00 - 0.07 K/uL    Comment: Performed at [facility], [place] [address], [place], [place] [address]  Comprehensive metabolic panel     Status: Abnormal   Collection Time: 12/31/21 12:42 PM  Result Value Ref Range   Sodium 138 135 - 145 mmol/L   Potassium 3.4 (L) 3.5 - 5.1 mmol/L   Chloride 104 98 - 111 mmol/L   CO2 23 22 - 32 mmol/L   Glucose, Bld 184 (H) 70 - 99 mg/dL    Comment: Glucose reference range applies only to samples taken after fasting for at least 8 hours.   BUN 13 8 - 23 mg/dL   Creatinine, Ser 1.18 (H) 0.44 - 1.00 mg/dL   Calcium 9.9 8.9 - 10.3 mg/dL   Total Protein 6.9 6.5 - 8.1 g/dL   Albumin 3.9 3.5 - 5.0 g/dL   AST 20 15 - 41 U/L   ALT 25 0 - 44 U/L   Alkaline Phosphatase 40 38 - 126 U/L   Total Bilirubin 0.9 0.3 - 1.2 mg/dL   GFR, Estimated 51 (L) >60 mL/min    Comment: (NOTE) Calculated using the CKD-EPI Creatinine Equation (2021)    Anion gap 11 5 - 15    Comment: Performed at [facility], [place] [address], [place], [place] [address]  Troponin I (High Sensitivity)     Status: Abnormal   Collection Time: 12/31/21 12:42 PM  Result Value Ref Range   Troponin I (High Sensitivity) 37 (H) <18 ng/L    Comment: (NOTE) Elevated high sensitivity troponin I (hsTnI) values and significant  changes across serial measurements may suggest ACS but many other  chronic and acute conditions are known to elevate hsTnI results.  Refer to the "Links" section for chest pain algorithms and additional  guidance. Performed at [facility], [place] [address], [place], [place] [address]   Urinalysis, Routine w reflex microscopic Urine, Clean Catch      Status: Abnormal   Collection Time: 12/31/21  1:00 PM  Result Value Ref Range   Color, Urine RED (A) YELLOW    Comment: BIOCHEMICALS MAY BE AFFECTED BY COLOR   APPearance CLOUDY (A) CLEAR   Specific Gravity, Urine 1.017 1.005 - 1.030   pH 6.0 5.0 - 8.0   Glucose, UA 50 (A) NEGATIVE mg/dL   Hgb urine dipstick LARGE (A) NEGATIVE   Bilirubin Urine NEGATIVE NEGATIVE   Ketones, ur 5 (A) NEGATIVE mg/dL   Protein, ur >=300 (A) NEGATIVE mg/dL   Nitrite NEGATIVE NEGATIVE   Leukocytes,Ua MODERATE (A) NEGATIVE   RBC / HPF >50 (H) 0 - 5 RBC/hpf   WBC, UA 6-10 0 - 5 WBC/hpf   Bacteria, UA RARE (A) NONE SEEN    Comment: Performed at [facility], [place] [address] [place], [place] [address]  Troponin I (High Sensitivity)     Status: Abnormal   Collection Time: 12/31/21  1:51 PM  Result Value Ref Range   Troponin I (High Sensitivity) 40 (H) <18 ng/L    Comment: (NOTE) Elevated high sensitivity troponin I (hsTnI) values and significant  changes across serial measurements may suggest ACS but many other  chronic and acute conditions are known to elevate hsTnI results.  Refer to the "Links" section for chest pain algorithms and additional  guidance. Performed at [facility], [place] [address], [place], [place] [address]    CT Renal Stone Study  Result Date: 12/31/2021 CLINICAL DATA:  Flank pain, kidney stone suspected EXAM: CT ABDOMEN AND PELVIS WITHOUT CONTRAST TECHNIQUE: Multidetector CT imaging of the abdomen and pelvis was performed following the standard protocol without IV contrast. RADIATION DOSE REDUCTION: This exam was performed according to the departmental dose-optimization program which includes automated exposure control, adjustment of the mA and/or kV according to patient size and/or use of iterative reconstruction technique. COMPARISON:  December 24, 2021 FINDINGS: Evaluation is limited by lack of IV contrast. Lower chest: No acute abnormality.  Hepatobiliary: Hepatic steatosis. Unremarkable noncontrast appearance of the gallbladder. Pancreas: No peripancreatic fat stranding. Spleen: Unremarkable. Adrenals/Urinary Tract: Adrenal glands are unremarkable. Patient is status post bilateral nephroureteral stent placement. There is a mildly decreased degree of fat stranding adjacent to the course of bilateral ureters. No new hydronephrosis. Bilateral nonobstructing nephrolithiasis, similar in comparison to prior. Again, it is noted that the 7 mm nephrolithiasis of the LEFT kidney remains within the entrance to the inferior calyx and no longer obstructs at the UPJ. Bladder is decompressed and unremarkable. Stomach/Bowel: No evidence of bowel obstruction. Scattered diverticulosis. Status post appendectomy. Moderate hiatal hernia. Vascular/Lymphatic: No significant vascular findings are present. No enlarged abdominal or pelvic lymph nodes. Reproductive: Uterus and bilateral adnexa are unremarkable. Other: Small periumbilical fat containing hernia. Musculoskeletal: Degenerative changes of the lumbar spine with focal levocurvature centered at L2-3. IMPRESSION: 1. Stable positioning of bilateral nephroureteral stents without evidence of new obstructing nephrolithiasis. No new hydronephrosis. 2. Mildly decreased fat stranding adjacent to the course of bilateral nephroureteral stents in comparison to prior. Electronically Signed   By: Valentino Saxon M.D.   On: 12/31/2021 13:39   DG Chest 2 View  Result Date: 12/31/2021 CLINICAL DATA:  Chest pain. EXAM: CHEST - 2 VIEW COMPARISON:  December 24, 2021. FINDINGS: The heart size and mediastinal contours are within normal limits. Both lungs are clear. The visualized skeletal structures are unremarkable. IMPRESSION: No active cardiopulmonary disease. Electronically Signed   By: Marijo Conception M.D.   On: 12/31/2021 12:17    Review of Systems  Constitutional:  Negative for chills and fever.  Genitourinary:  Positive  for flank pain and urgency.  All other systems reviewed and are negative.   Height _0  (1.575 m), weight 86.4 kg. Physical Exam Vitals reviewed.  HENT:     Head: Normocephalic.     Mouth/Throat:     Mouth: Mucous membranes are moist.  Cardiovascular:     Rate and Rhythm: Normal rate.  Pulmonary:     Effort: Pulmonary effort is normal.  Abdominal:     Comments: Large truncal obesity limits sensitivity of exam.   Genitourinary:    Comments: No CVAT at present Musculoskeletal:        General: Normal range of motion.     Cervical back: Normal range of motion.  Skin:    General: Skin is warm.  Neurological:     General: No focal deficit present.     Mental Status: She is alert.      Assessment/Plan  Proceed as planned with BILATERAL ureteroscopic stone manipulation. Risks, benefits, alternatives, expected peri-op course discussed previously and reiterated today including need for temporary stent excahnge as bilateral procedure.   Alexis Frock, MD 01/02/2022, 6:58 AM

## 2022-01-02 NOTE — Discharge Instructions (Addendum)
1 - You may have urinary urgency (bladder spasms) and bloody urine on / off with stent in place. This is normal.  2 - Call MD or go to ER for fever >102, severe pain / nausea / vomiting not relieved by medications, or acute change in medical status   CYSTOSCOPY HOME CARE INSTRUCTIONS  Activity: Rest for the remainder of the day.  Do not drive or operate equipment today.  You may resume normal activities in one to two days as instructed by your physician.   Meals: Drink plenty of liquids and eat light foods such as gelatin or soup this evening.  You may return to a normal meal plan tomorrow.  Return to Work: You may return to work in one to two days or as instructed by your physician.  Special Instructions / Symptoms: Call your physician if any of these symptoms occur:   -persistent or heavy bleeding  -bleeding which continues after first few urination  -large blood clots that are difficult to pass  -urine stream diminishes or stops completely  -fever equal to or higher than 101 degrees Farenheit.  -cloudy urine with a strong, foul odor  -severe pain  Females should always wipe from front to back after elimination.  You may feel some burning pain when you urinate.  This should disappear with time.  Applying moist heat to the lower abdomen or a hot tub bath may help relieve the pain.     Post Anesthesia Home Care Instructions  Activity: Get plenty of rest for the remainder of the day. A responsible individual must stay with you for 24 hours following the procedure.  For the next 24 hours, DO NOT: -Drive a car -Paediatric nurse -Drink alcoholic beverages -Take any medication unless instructed by your physician -Make any legal decisions or sign important papers.  Meals: Start with liquid foods such as gelatin or soup. Progress to regular foods as tolerated. Avoid greasy, spicy, heavy foods. If nausea and/or vomiting occur, drink only clear liquids until the nausea and/or  vomiting subsides. Call your physician if vomiting continues.  Special Instructions/Symptoms: Your throat may feel dry or sore from the anesthesia or the breathing tube placed in your throat during surgery. If this causes discomfort, gargle with warm salt water. The discomfort should disappear within 24 hours.  If you had a scopolamine patch placed behind your ear for the management of post- operative nausea and/or vomiting:  1. The medication in the patch is effective for 72 hours, after which it should be removed.  Wrap patch in a tissue and discard in the trash. Wash hands thoroughly with soap and water. 2. You may remove the patch earlier than 72 hours if you experience unpleasant side effects which may include dry mouth, dizziness or visual disturbances. 3. Avoid touching the patch. Wash your hands with soap and water after contact with the patch.

## 2022-01-02 NOTE — Brief Op Note (Signed)
01/02/2022  11:04 AM  PATIENT:  Jade Perez  66 y.o. female  PRE-OPERATIVE DIAGNOSIS:  BILATERAL RENAL CALCULI  POST-OPERATIVE DIAGNOSIS:  BILATERAL RENAL CALCULI  PROCEDURE:  Procedure(s): CYSTOSCOPY WITH RETROGRADE PYELOGRAM, URETEROSCOPY AND STENT REPLACEMENT (Bilateral) HOLMIUM LASER APPLICATION (Bilateral)  SURGEON:  Surgeon(s) and Role:    Alexis Frock, MD - Primary  PHYSICIAN ASSISTANT:   ASSISTANTS: none   ANESTHESIA:   general  EBL:  0 mL   BLOOD ADMINISTERED:none  DRAINS: none   LOCAL MEDICATIONS USED:  NONE  SPECIMEN:  Source of Specimen:  bilateral renal stone fragments  DISPOSITION OF SPECIMEN:   [facility] for compositional analysis  COUNTS:  YES  TOURNIQUET:  * No tourniquets in log *  DICTATION: .Other Dictation: Dictation Number [id number]  PLAN OF CARE: Discharge to home after PACU  PATIENT DISPOSITION:  PACU - hemodynamically stable.   Delay start of Pharmacological VTE agent (>24hrs) due to surgical blood loss or risk of bleeding: not applicable

## 2022-01-02 NOTE — Transfer of Care (Signed)
Immediate Anesthesia Transfer of Care Note  Patient: Jade Perez  Procedure(s) Performed: Procedure(s) (LRB): CYSTOSCOPY WITH RETROGRADE PYELOGRAM, URETEROSCOPY AND STENT REPLACEMENT (Bilateral) HOLMIUM LASER APPLICATION (Bilateral)  Patient Location: PACU  Anesthesia Type: General  Level of Consciousness: awake, sedated, patient cooperative and responds to stimulation  Airway & Oxygen Therapy: Patient Spontanous Breathing and Patient connected to  02   Post-op Assessment: Report given to PACU RN, Post -op Vital signs reviewed and stable and Patient moving all extremities  Post vital signs: Reviewed and stable  Complications: No apparent anesthesia complications

## 2022-01-02 NOTE — Anesthesia Procedure Notes (Signed)
Procedure Name: LMA Insertion Date/Time: 01/02/2022 10:05 AM  Performed by: Justice Rocher, CRNAPre-anesthesia Checklist: Patient identified, Emergency Drugs available, Suction available, Patient being monitored and Timeout performed Patient Re-evaluated:Patient Re-evaluated prior to induction Oxygen Delivery Method: Circle system utilized Preoxygenation: Pre-oxygenation with 100% oxygen Induction Type: IV induction Ventilation: Mask ventilation without difficulty LMA: LMA inserted LMA Size: 4.0 Number of attempts: 1 Airway Equipment and Method: Bite block Placement Confirmation: positive ETCO2, breath sounds checked- equal and bilateral and CO2 detector Tube secured with: Tape Dental Injury: Teeth and Oropharynx as per pre-operative assessment  Comments: Paper tape used on face for LMA

## 2022-01-02 NOTE — Op Note (Signed)
NAME: Jade Perez, Jade Perez MEDICAL RECORD NO: [id number] ACCOUNT NO: [id number] DATE OF BIRTH: 02-15-1956 FACILITY: [facility] LOCATION: [facility] PHYSICIAN: Alexis Frock, MD  Operative Report   DATE OF PROCEDURE: 01/02/2022  PREOPERATIVE DIAGNOSIS:  Left ureteral, bilateral renal stones.  PROCEDURE PERFORMED:   1.  Cystoscopy with bilateral retrograde pyelograms, interpretation. 2.  Bilateral ureteroscopy with laser lithotripsy. 3.  Exchange of bilateral ureteral stents.  ESTIMATED BLOOD LOSS:  Nil.  COMPLICATIONS:  None.  SPECIMEN:  Bilateral renal stone fragments for composition analysis.  FINDINGS:   1.  Multifocal bilateral renal stones, left greater than right. 2.  Complete resolution of all accessible stone fragments larger than one-third mm following laser lithotripsy and basket extraction. 3.  Successful replacement of bilateral ureteral stents, proximal end in the renal pelvis, distal end in urinary bladder.  No tether.  INDICATIONS:  The patient is a 66 year old lady with history of prior urolithiasis.  She was found on workup of colicky flank pain and renal insufficiency. They have a fairly large volume left UPJ stone as well as bilateral renal stones.  She underwent  temporizing renal decompression with bilateral stents on path towards stone free and she now presents for bilateral ureteroscopy with goal of stone free.  Informed consent was obtained and placed in the medical record.  PROCEDURE IN DETAIL:  The patient is being identified and verified, procedure being bilateral ureteroscopic stone manipulation was confirmed.  Procedure timeout was performed.  Intravenous antibiotics were administered.  General anesthesia was induced.   The patient was placed into a low lithotomy position.  Sterile field was created, prepped and draped the patient's vagina, introitus, and proximal thighs using iodine.  Cystourethroscopy was performed using 21-French rigid cystoscope with offset lens.    Inspection of urinary bladder revealed no diverticula, calcifications, papillary lesions.  Distal end of the right stent was grasped, brought to the level of the urethral meatus.  A 0.038 ZIPwire was advanced to the level of the upper pole, exchanged for  open-ended catheter and right retrograde pyelogram was obtained.  Right retrograde pyelogram demonstrated single right ureter, single system right kidney.  The kidney to have numerous calyces and quite delicate intrarenal anatomy.  No filling defects or narrowing noted.  A ZIPwire was once again advanced set aside as a  safety wire.  Next, a distal end of the left stent was grasped, brought to the level of the urethral meatus.  A 0.038 ZIPwire was advanced. The stent was exchanged for open-ended catheter, and a left retrograde pyelogram was obtained.  Left retrograde pyelogram demonstrated single left ureter, single system left kidney.  There was a mobile filling defect in the area of the UPJ consistent with known stone.  The ZIPwire was once again advanced set aside as a safety wire.  An 8-French  feeding tube placed in the urinary bladder for pressure release and semirigid ureteroscopy was performed in the distal orifice left ureter alongside as a separate sensor working wire.  No mucosal abnormalities are found.  Next, semirigid ureteroscopy  performed of the distal orifice of the right ureter alongside as a separate sensor working wire.  No mucosal abnormalities were found.  The semirigid scope was then exchanged for a short length ureteral access sheath to the level of proximal ureter using  continuous fluoroscopic guidance and flexible digital ureteroscopy with proximal right ureter and systematic inspection of the right kidney and all calyces x3.  There were 2 free floating stones, one in the area of the UPJ.  This was amenable to simple  basketing, it was brought out in entirety.  There was another free floating stone in the upper mid calyx also  amenable to simple basketing.  There were several small papillary tip calcifications that were amenable to ablation with laser energy using  settings of 1 joule and 10 Hz.  This completely resolved all accessible stone fragments larger than one-third third mm on the right side.  Access sheath was removed under continuous vision, no significant mucosal abnormalities were found.  Next, the  access sheath was placed over the left sensor working wire to the level of the proximal left ureter using continuous fluoroscopic guidance and flexible digital ureteroscopy was performed in the proximal left ureter and systematic inspection of left  kidney. As inspected there was a fairly large free floating dominant stone in the area of the UPJ.  This was repositioned into an upper mid calyx.  It was much too large for simple basketing.  Holmium laser energy then applied to stone using settings of  0.3 joules and 30 Hz using dusting technique, approximately 70% of the stone was dusted, 30% fragmented with the fragments then being amenable to basketing.  There were several additional calcifications noted including a lower pole papillary tip  calcifications that was ablated using holmium laser energy.  Following this, complete resolution of all accessible stone fragments on the left side larger than one-third third mm.  Access sheath was removed under continuous vision.  There was significant  mucosal edema in the area of the UPJ consistent with known site of prior stone impaction.  Given the significant edema in the bilateral nature of the procedure, it was felt that interval stenting with nontethered stent would be most prudent.  As such,  new 5 x 24 Polaris type stents were placed over the remaining safety wires bilaterally using fluoroscopic guidance.  Good proximal and distal planes were noted.  Procedure was then terminated.  The patient tolerated procedure well, no immediate  periprocedural complications.  The patient  taken to postanesthesia care in stable condition.  Plan for discharge home.   PUS D: 01/02/2022 11:05:28 am T: 01/02/2022 11:30:00 am  JOB: [id number]/ [id number]

## 2022-01-02 NOTE — Anesthesia Postprocedure Evaluation (Signed)
Anesthesia Post Note  Patient: Cheral Bay Kregel  Procedure(s) Performed: CYSTOSCOPY WITH RETROGRADE PYELOGRAM, URETEROSCOPY AND STENT REPLACEMENT (Bilateral: Ureter) HOLMIUM LASER APPLICATION (Bilateral: Ureter)     Patient location during evaluation: PACU Anesthesia Type: General Level of consciousness: awake and alert Pain management: pain level controlled Vital Signs Assessment: post-procedure vital signs reviewed and stable Respiratory status: spontaneous breathing, nonlabored ventilation and respiratory function stable Cardiovascular status: blood pressure returned to baseline and stable Postop Assessment: no apparent nausea or vomiting Anesthetic complications: no   No notable events documented.  Last Vitals:  Vitals:   01/02/22 1215 01/02/22 1230  BP: (!) 142/70 137/71  Pulse: 84 87  Resp: 16 13  Temp:  36.5 C  SpO2: 97% 93%    Last Pain:  Vitals:   01/02/22 1230  TempSrc:   PainSc: [place]

## 2022-01-02 NOTE — Anesthesia Preprocedure Evaluation (Signed)
Anesthesia Evaluation  Patient identified by MRN, date of birth, ID band Patient awake    Reviewed: Allergy & Precautions, NPO status , Patient's Chart, lab work & pertinent test results  Airway Mallampati: II  TM Distance: >3 FB Neck ROM: Full    Dental no notable dental hx.    Pulmonary asthma , sleep apnea , COPD,    Pulmonary exam normal breath sounds clear to auscultation       Cardiovascular hypertension, Pt. on medications + Past MI  Normal cardiovascular exam Rhythm:Regular Rate:Normal     Neuro/Psych PSYCHIATRIC DISORDERS Anxiety Depression  Neuromuscular disease    GI/Hepatic Neg liver ROS, hiatal hernia, GERD  ,  Endo/Other  diabetes, Type 2, Oral Hypoglycemic Agents  Renal/GU Renal disease     Musculoskeletal  (+) Arthritis , Osteoarthritis,    Abdominal (+) + obese,   Peds  Hematology   Anesthesia Other Findings   Reproductive/Obstetrics                             Anesthesia Physical  Anesthesia Plan  ASA: 3  Anesthesia Plan: General   Post-op Pain Management:    Induction: Intravenous  PONV Risk Score and Plan: 3 and Ondansetron, Midazolam, Treatment may vary due to age or medical condition and Dexamethasone  Airway Management Planned: LMA  Additional Equipment:   Intra-op Plan:   Post-operative Plan: Extubation in OR  Informed Consent: I have reviewed the patients History and Physical, chart, labs and discussed the procedure including the risks, benefits and alternatives for the proposed anesthesia with the patient or authorized representative who has indicated his/her understanding and acceptance.     Dental advisory given  Plan Discussed with: Anesthesiologist and CRNA  Anesthesia Plan Comments:         Anesthesia Quick Evaluation

## 2022-01-03 ENCOUNTER — Other Ambulatory Visit: Payer: Self-pay

## 2022-01-03 ENCOUNTER — Emergency Department (HOSPITAL_BASED_OUTPATIENT_CLINIC_OR_DEPARTMENT_OTHER): Payer: Medicare PPO

## 2022-01-03 ENCOUNTER — Inpatient Hospital Stay (HOSPITAL_BASED_OUTPATIENT_CLINIC_OR_DEPARTMENT_OTHER)
Admission: EM | Admit: 2022-01-03 | Discharge: 2022-01-05 | DRG: 660 | Disposition: A | Discharge status: Home / Self Care | Payer: Medicare PPO | Attending: Internal Medicine | Admitting: Internal Medicine

## 2022-01-03 ENCOUNTER — Encounter (HOSPITAL_BASED_OUTPATIENT_CLINIC_OR_DEPARTMENT_OTHER): Payer: Self-pay | Admitting: Urology

## 2022-01-03 DIAGNOSIS — J309 Allergic rhinitis, unspecified: Secondary | ICD-10-CM | POA: Diagnosis present

## 2022-01-03 DIAGNOSIS — N179 Acute kidney failure, unspecified: Secondary | ICD-10-CM | POA: Diagnosis not present

## 2022-01-03 DIAGNOSIS — E782 Mixed hyperlipidemia: Secondary | ICD-10-CM | POA: Diagnosis present

## 2022-01-03 DIAGNOSIS — Z79899 Other long term (current) drug therapy: Secondary | ICD-10-CM

## 2022-01-03 DIAGNOSIS — Z881 Allergy status to other antibiotic agents status: Secondary | ICD-10-CM

## 2022-01-03 DIAGNOSIS — K219 Gastro-esophageal reflux disease without esophagitis: Secondary | ICD-10-CM | POA: Diagnosis present

## 2022-01-03 DIAGNOSIS — Z91040 Latex allergy status: Secondary | ICD-10-CM

## 2022-01-03 DIAGNOSIS — E119 Type 2 diabetes mellitus without complications: Secondary | ICD-10-CM | POA: Diagnosis present

## 2022-01-03 DIAGNOSIS — R112 Nausea with vomiting, unspecified: Secondary | ICD-10-CM | POA: Diagnosis not present

## 2022-01-03 DIAGNOSIS — Z7951 Long term (current) use of inhaled steroids: Secondary | ICD-10-CM

## 2022-01-03 DIAGNOSIS — J4520 Mild intermittent asthma, uncomplicated: Secondary | ICD-10-CM | POA: Diagnosis present

## 2022-01-03 DIAGNOSIS — I2510 Atherosclerotic heart disease of native coronary artery without angina pectoris: Secondary | ICD-10-CM | POA: Diagnosis present

## 2022-01-03 DIAGNOSIS — Z885 Allergy status to narcotic agent status: Secondary | ICD-10-CM

## 2022-01-03 DIAGNOSIS — Z91030 Bee allergy status: Secondary | ICD-10-CM

## 2022-01-03 DIAGNOSIS — I252 Old myocardial infarction: Secondary | ICD-10-CM

## 2022-01-03 DIAGNOSIS — E860 Dehydration: Secondary | ICD-10-CM | POA: Diagnosis not present

## 2022-01-03 DIAGNOSIS — N202 Calculus of kidney with calculus of ureter: Principal | ICD-10-CM | POA: Diagnosis present

## 2022-01-03 DIAGNOSIS — Z888 Allergy status to other drugs, medicaments and biological substances status: Secondary | ICD-10-CM

## 2022-01-03 DIAGNOSIS — G8929 Other chronic pain: Secondary | ICD-10-CM | POA: Diagnosis present

## 2022-01-03 DIAGNOSIS — Z79891 Long term (current) use of opiate analgesic: Secondary | ICD-10-CM

## 2022-01-03 DIAGNOSIS — F32A Depression, unspecified: Secondary | ICD-10-CM | POA: Diagnosis present

## 2022-01-03 DIAGNOSIS — E876 Hypokalemia: Secondary | ICD-10-CM | POA: Diagnosis not present

## 2022-01-03 DIAGNOSIS — Z6833 Body mass index (BMI) 33.0-33.9, adult: Secondary | ICD-10-CM

## 2022-01-03 DIAGNOSIS — G4733 Obstructive sleep apnea (adult) (pediatric): Secondary | ICD-10-CM | POA: Diagnosis present

## 2022-01-03 DIAGNOSIS — Z79890 Hormone replacement therapy (postmenopausal): Secondary | ICD-10-CM

## 2022-01-03 DIAGNOSIS — N390 Urinary tract infection, site not specified: Secondary | ICD-10-CM | POA: Diagnosis present

## 2022-01-03 DIAGNOSIS — Z886 Allergy status to analgesic agent status: Secondary | ICD-10-CM

## 2022-01-03 DIAGNOSIS — Z87442 Personal history of urinary calculi: Secondary | ICD-10-CM

## 2022-01-03 DIAGNOSIS — F411 Generalized anxiety disorder: Secondary | ICD-10-CM | POA: Diagnosis present

## 2022-01-03 DIAGNOSIS — Z91048 Other nonmedicinal substance allergy status: Secondary | ICD-10-CM

## 2022-01-03 DIAGNOSIS — I10 Essential (primary) hypertension: Secondary | ICD-10-CM | POA: Diagnosis present

## 2022-01-03 DIAGNOSIS — Z882 Allergy status to sulfonamides status: Secondary | ICD-10-CM

## 2022-01-03 DIAGNOSIS — Z7985 Long-term (current) use of injectable non-insulin antidiabetic drugs: Secondary | ICD-10-CM

## 2022-01-03 DIAGNOSIS — E669 Obesity, unspecified: Secondary | ICD-10-CM | POA: Diagnosis present

## 2022-01-03 LAB — URINALYSIS, ROUTINE W REFLEX MICROSCOPIC
Bilirubin Urine: NEGATIVE
Glucose, UA: NEGATIVE mg/dL
Ketones, ur: NEGATIVE mg/dL
Nitrite: NEGATIVE
Protein, ur: >=300 mg/dL — AB
Specific Gravity, Urine: 1.02 (ref 1.005–1.030)
pH: >=9 (ref 5.0–8.0)

## 2022-01-03 LAB — CBC
HCT: 38.8 % (ref 36.0–46.0)
Hemoglobin: 13.5 g/dL (ref 12.0–15.0)
MCH: 30.9 pg (ref 26.0–34.0)
MCHC: 34.8 g/dL (ref 30.0–36.0)
MCV: 88.8 fL (ref 80.0–100.0)
Platelets: 382 10*3/uL (ref 150–400)
RBC: 4.37 MIL/uL (ref 3.87–5.11)
RDW: 12.5 % (ref 11.5–15.5)
WBC: 6.6 10*3/uL (ref 4.0–10.5)
nRBC: 0 % (ref 0.0–0.2)

## 2022-01-03 LAB — COMPREHENSIVE METABOLIC PANEL
ALT: 22 U/L (ref 0–44)
AST: 21 U/L (ref 15–41)
Albumin: 4.2 g/dL (ref 3.5–5.0)
Alkaline Phosphatase: 39 U/L (ref 38–126)
Anion gap: 11 (ref 5–15)
BUN: 13 mg/dL (ref 8–23)
CO2: 25 mmol/L (ref 22–32)
Calcium: 10.3 mg/dL (ref 8.9–10.3)
Chloride: 102 mmol/L (ref 98–111)
Creatinine, Ser: 1.18 mg/dL — ABNORMAL HIGH (ref 0.44–1.00)
GFR, Estimated: 51 mL/min — ABNORMAL LOW (ref >60)
Glucose, Bld: 162 mg/dL — ABNORMAL HIGH (ref 70–99)
Potassium: 3.1 mmol/L — ABNORMAL LOW (ref 3.5–5.1)
Sodium: 138 mmol/L (ref 135–145)
Total Bilirubin: 0.7 mg/dL (ref 0.3–1.2)
Total Protein: 7.4 g/dL (ref 6.5–8.1)

## 2022-01-03 LAB — URINALYSIS, MICROSCOPIC (REFLEX): RBC / HPF: >50 RBC/hpf (ref 0–5)

## 2022-01-03 MED ORDER — ONDANSETRON HCL 4 MG/2ML IJ SOLN
4.0000 mg | Freq: Once | INTRAMUSCULAR | Status: AC
  Administered 2022-01-03: 4 mg via INTRAVENOUS
  Filled 2022-01-03: qty 2

## 2022-01-03 MED ORDER — SODIUM CHLORIDE 0.9 % IV BOLUS
1000.0000 mL | Freq: Once | INTRAVENOUS | Status: AC
  Administered 2022-01-03: 1000 mL via INTRAVENOUS

## 2022-01-03 MED ORDER — MORPHINE SULFATE (PF) 4 MG/ML IV SOLN
4.0000 mg | Freq: Once | INTRAVENOUS | Status: AC
  Administered 2022-01-03: 4 mg via INTRAVENOUS
  Filled 2022-01-03: qty 1

## 2022-01-03 MED ORDER — ALUM & MAG HYDROXIDE-SIMETH 200-200-20 MG/5ML PO SUSP
30.0000 mL | Freq: Once | ORAL | Status: AC
  Administered 2022-01-04: 30 mL via ORAL
  Filled 2022-01-03: qty 30

## 2022-01-03 NOTE — ED Triage Notes (Signed)
Pt to ED c/o emesis that started 3 hours ago. Reports dry heaves all day and yesterday after being discharged from hospital. Reports lithotripsy and stents placed yesterday. Reports 3 emesis in last 24 hours.

## 2022-01-04 ENCOUNTER — Encounter (HOSPITAL_COMMUNITY): Payer: Self-pay

## 2022-01-04 DIAGNOSIS — E782 Mixed hyperlipidemia: Secondary | ICD-10-CM | POA: Diagnosis present

## 2022-01-04 DIAGNOSIS — E876 Hypokalemia: Secondary | ICD-10-CM | POA: Diagnosis present

## 2022-01-04 DIAGNOSIS — E119 Type 2 diabetes mellitus without complications: Secondary | ICD-10-CM | POA: Diagnosis present

## 2022-01-04 DIAGNOSIS — I2510 Atherosclerotic heart disease of native coronary artery without angina pectoris: Secondary | ICD-10-CM | POA: Diagnosis present

## 2022-01-04 DIAGNOSIS — R112 Nausea with vomiting, unspecified: Principal | ICD-10-CM | POA: Diagnosis present

## 2022-01-04 DIAGNOSIS — F32A Depression, unspecified: Secondary | ICD-10-CM | POA: Diagnosis present

## 2022-01-04 DIAGNOSIS — Z7985 Long-term (current) use of injectable non-insulin antidiabetic drugs: Secondary | ICD-10-CM | POA: Diagnosis not present

## 2022-01-04 DIAGNOSIS — I10 Essential (primary) hypertension: Secondary | ICD-10-CM | POA: Diagnosis present

## 2022-01-04 DIAGNOSIS — N179 Acute kidney failure, unspecified: Secondary | ICD-10-CM | POA: Diagnosis not present

## 2022-01-04 DIAGNOSIS — I252 Old myocardial infarction: Secondary | ICD-10-CM | POA: Diagnosis not present

## 2022-01-04 DIAGNOSIS — G4733 Obstructive sleep apnea (adult) (pediatric): Secondary | ICD-10-CM | POA: Diagnosis present

## 2022-01-04 DIAGNOSIS — E860 Dehydration: Secondary | ICD-10-CM | POA: Diagnosis not present

## 2022-01-04 DIAGNOSIS — J4520 Mild intermittent asthma, uncomplicated: Secondary | ICD-10-CM | POA: Diagnosis present

## 2022-01-04 DIAGNOSIS — Z79891 Long term (current) use of opiate analgesic: Secondary | ICD-10-CM | POA: Diagnosis not present

## 2022-01-04 DIAGNOSIS — Z79890 Hormone replacement therapy (postmenopausal): Secondary | ICD-10-CM | POA: Diagnosis not present

## 2022-01-04 DIAGNOSIS — F411 Generalized anxiety disorder: Secondary | ICD-10-CM | POA: Diagnosis present

## 2022-01-04 DIAGNOSIS — G8929 Other chronic pain: Secondary | ICD-10-CM | POA: Diagnosis present

## 2022-01-04 DIAGNOSIS — Z87442 Personal history of urinary calculi: Secondary | ICD-10-CM | POA: Diagnosis not present

## 2022-01-04 DIAGNOSIS — E669 Obesity, unspecified: Secondary | ICD-10-CM | POA: Diagnosis present

## 2022-01-04 DIAGNOSIS — J309 Allergic rhinitis, unspecified: Secondary | ICD-10-CM | POA: Diagnosis present

## 2022-01-04 DIAGNOSIS — N390 Urinary tract infection, site not specified: Secondary | ICD-10-CM | POA: Diagnosis present

## 2022-01-04 DIAGNOSIS — N202 Calculus of kidney with calculus of ureter: Secondary | ICD-10-CM | POA: Diagnosis present

## 2022-01-04 DIAGNOSIS — K219 Gastro-esophageal reflux disease without esophagitis: Secondary | ICD-10-CM | POA: Diagnosis present

## 2022-01-04 LAB — HEMOGLOBIN A1C
Hgb A1c MFr Bld: 6.5 % — ABNORMAL HIGH (ref 4.8–5.6)
Mean Plasma Glucose: 139.85 mg/dL

## 2022-01-04 LAB — LIPASE, BLOOD: Lipase: 28 U/L (ref 11–51)

## 2022-01-04 LAB — GLUCOSE, CAPILLARY
Glucose-Capillary: 126 mg/dL — ABNORMAL HIGH (ref 70–99)
Glucose-Capillary: 131 mg/dL — ABNORMAL HIGH (ref 70–99)
Glucose-Capillary: 133 mg/dL — ABNORMAL HIGH (ref 70–99)
Glucose-Capillary: 135 mg/dL — ABNORMAL HIGH (ref 70–99)
Glucose-Capillary: 157 mg/dL — ABNORMAL HIGH (ref 70–99)

## 2022-01-04 LAB — MAGNESIUM: Magnesium: 1.4 mg/dL — ABNORMAL LOW (ref 1.7–2.4)

## 2022-01-04 MED ORDER — PANTOPRAZOLE SODIUM 40 MG IV SOLR
40.0000 mg | Freq: Two times a day (BID) | INTRAVENOUS | Status: DC
  Administered 2022-01-04 – 2022-01-05 (×3): 40 mg via INTRAVENOUS
  Filled 2022-01-04 (×3): qty 10

## 2022-01-04 MED ORDER — SODIUM CHLORIDE 0.9 % IV SOLN: 12.5000 mg | Freq: Four times a day (QID) | INTRAVENOUS | Status: DC | PRN

## 2022-01-04 MED ORDER — CITALOPRAM HYDROBROMIDE 20 MG PO TABS
20.0000 mg | ORAL_TABLET | Freq: Every day | ORAL | Status: DC
  Administered 2022-01-04 – 2022-01-05 (×2): 20 mg via ORAL
  Filled 2022-01-04 (×2): qty 1

## 2022-01-04 MED ORDER — TRIAMCINOLONE ACETONIDE 55 MCG/ACT NA AERO: 2.0000 | INHALATION_SPRAY | Freq: Every day | NASAL | Status: DC | PRN

## 2022-01-04 MED ORDER — MAGNESIUM SULFATE 2 GM/50ML IV SOLN
2.0000 g | Freq: Once | INTRAVENOUS | Status: AC
  Administered 2022-01-04: 2 g via INTRAVENOUS
  Filled 2022-01-04: qty 50

## 2022-01-04 MED ORDER — ACETAMINOPHEN 650 MG RE SUPP
650.0000 mg | Freq: Four times a day (QID) | RECTAL | Status: DC | PRN
  Administered 2022-01-04: 650 mg via RECTAL
  Filled 2022-01-04: qty 1

## 2022-01-04 MED ORDER — KCL IN DEXTROSE-NACL 20-5-0.9 MEQ/L-%-% IV SOLN
INTRAVENOUS | Status: DC
  Filled 2022-01-04 (×3): qty 1000

## 2022-01-04 MED ORDER — POTASSIUM CHLORIDE 10 MEQ/100ML IV SOLN
10.0000 meq | INTRAVENOUS | Status: AC
  Administered 2022-01-04 (×3): 10 meq via INTRAVENOUS
  Filled 2022-01-04 (×3): qty 100

## 2022-01-04 MED ORDER — SUCRALFATE 1 GM/10ML PO SUSP
1.0000 g | Freq: Three times a day (TID) | ORAL | Status: DC
  Administered 2022-01-04 – 2022-01-05 (×5): 1 g via ORAL
  Filled 2022-01-04 (×5): qty 10

## 2022-01-04 MED ORDER — POTASSIUM CHLORIDE 10 MEQ/100ML IV SOLN
10.0000 meq | Freq: Once | INTRAVENOUS | Status: AC
  Administered 2022-01-04: 10 meq via INTRAVENOUS
  Filled 2022-01-04: qty 100

## 2022-01-04 MED ORDER — TAMSULOSIN HCL 0.4 MG PO CAPS
0.4000 mg | ORAL_CAPSULE | Freq: Every day | ORAL | Status: DC
  Administered 2022-01-04: 0.4 mg via ORAL
  Filled 2022-01-04: qty 1

## 2022-01-04 MED ORDER — ACETAMINOPHEN 500 MG PO TABS: 500.0000 mg | ORAL_TABLET | Freq: Four times a day (QID) | ORAL | Status: DC | PRN

## 2022-01-04 MED ORDER — MORPHINE SULFATE (PF) 2 MG/ML IV SOLN: 2.0000 mg | INTRAVENOUS | Status: DC | PRN

## 2022-01-04 MED ORDER — ALBUTEROL SULFATE HFA 108 (90 BASE) MCG/ACT IN AERS: 1.0000 | INHALATION_SPRAY | Freq: Four times a day (QID) | RESPIRATORY_TRACT | Status: DC | PRN

## 2022-01-04 MED ORDER — FAMOTIDINE IN NACL 20-0.9 MG/50ML-% IV SOLN
20.0000 mg | Freq: Two times a day (BID) | INTRAVENOUS | Status: DC
  Administered 2022-01-04 – 2022-01-05 (×3): 20 mg via INTRAVENOUS
  Filled 2022-01-04 (×3): qty 50

## 2022-01-04 MED ORDER — FESOTERODINE FUMARATE ER 4 MG PO TB24
4.0000 mg | ORAL_TABLET | Freq: Every day | ORAL | Status: DC
  Administered 2022-01-05: 4 mg via ORAL
  Filled 2022-01-04 (×2): qty 1

## 2022-01-04 MED ORDER — LACTATED RINGERS IV SOLN: INTRAVENOUS | Status: DC

## 2022-01-04 MED ORDER — DROPERIDOL 2.5 MG/ML IJ SOLN
1.2500 mg | Freq: Once | INTRAMUSCULAR | Status: AC
  Administered 2022-01-04: 1.25 mg via INTRAVENOUS
  Filled 2022-01-04: qty 2

## 2022-01-04 MED ORDER — ONDANSETRON HCL 4 MG/2ML IJ SOLN
4.0000 mg | Freq: Four times a day (QID) | INTRAMUSCULAR | Status: DC
  Administered 2022-01-04 – 2022-01-05 (×5): 4 mg via INTRAVENOUS
  Filled 2022-01-04 (×5): qty 2

## 2022-01-04 MED ORDER — ALBUTEROL SULFATE (2.5 MG/3ML) 0.083% IN NEBU: 2.5000 mg | INHALATION_SOLUTION | Freq: Four times a day (QID) | RESPIRATORY_TRACT | Status: DC | PRN

## 2022-01-04 MED ORDER — INSULIN ASPART 100 UNIT/ML IJ SOLN
0.0000 [IU] | INTRAMUSCULAR | Status: DC
  Administered 2022-01-04: 3 [IU] via SUBCUTANEOUS
  Administered 2022-01-04 – 2022-01-05 (×2): 1 [IU] via SUBCUTANEOUS
  Administered 2022-01-05: 2 [IU] via SUBCUTANEOUS
  Administered 2022-01-05 (×2): 1 [IU] via SUBCUTANEOUS

## 2022-01-04 MED ORDER — OXYBUTYNIN CHLORIDE 5 MG PO TABS
5.0000 mg | ORAL_TABLET | Freq: Three times a day (TID) | ORAL | Status: DC
  Administered 2022-01-04 – 2022-01-05 (×3): 5 mg via ORAL
  Filled 2022-01-04 (×4): qty 1

## 2022-01-04 MED ORDER — ACETAMINOPHEN 325 MG PO TABS
650.0000 mg | ORAL_TABLET | Freq: Four times a day (QID) | ORAL | Status: DC | PRN
  Administered 2022-01-05 (×3): 650 mg via ORAL
  Filled 2022-01-04 (×3): qty 2

## 2022-01-04 MED ORDER — ONDANSETRON HCL 4 MG/2ML IJ SOLN
4.0000 mg | Freq: Four times a day (QID) | INTRAMUSCULAR | Status: DC
  Administered 2022-01-04: 4 mg via INTRAVENOUS
  Filled 2022-01-04: qty 2

## 2022-01-04 NOTE — ED Notes (Signed)
Attempted report x1. 

## 2022-01-04 NOTE — Progress Notes (Signed)
Mobility Specialist - Progress Note   01/04/22 0919  Mobility  HOB Elevated/Bed Position Self regulated  Activity Ambulated independently in hallway  Range of Motion/Exercises Active  Level of Assistance Independent  Assistive Device None  Distance Ambulated (ft) 500 ft  Activity Response Tolerated well  $Mobility charge 1 Mobility   Pt was found in bed and agreeable to ambulate. Had no complaints and at EOS returned to bed with all necessities in reach.  Ferd Hibbs Mobility Specialist

## 2022-01-04 NOTE — H&P (Signed)
History and Physical    Patient: Jade Perez [id number] DOB: Nov 23, 1955 DOA: 01/03/2022 DOS: the patient was seen and examined on 01/04/2022 PCP: Abner GreenspanHodges, Beth, MD  Patient coming from: Home, lives with husband  Chief Complaint:  Chief Complaint  Patient presents with   Emesis   HPI: Jade Perez is a 66 y.o. female with medical history significant of asthma, hypertension, dyslipidemia, mood disorder, reflux, and diabetes mellitus on oral agents as well as Ozempic.  Patient with extensive history dating back to hospitalization from 8/31 to 9/9.  At that time she had issues related to non-STEMI as well as nephrolithiasis that eventually required bilateral ureteral stent placement.  During this timeframe patient had a UTI as well as abdominal pain and nausea and vomiting and dehydration that were well controlled in the hospital.  She was discharged on Zofran and Protonix for her nausea and Norco for pain.  She did not tolerate the Norco and switch to Tylenol.  Subsequently she has had uncontrolled pain that has led to nausea and vomiting and worsening of her reflux.  She states that she has been unable to eat much of anything during this timeframe she has basically eaten 2 small portion meals but has been subsisting on soups.  She primarily has low back pain and bilateral inguinal pain.  On Thursday she began having dry heaves.  She called her urologist Dr. Angelica ChessmanMandy who instructed her to come to the hospital.  Evaluation in the ER revealed a patient that was dehydrated, hypokalemic with hypomagnesemia and AKI.  Urinalysis was abnormal brown color cloudy appearance large hemoglobin small leukocytes greater than 300 protein with a normal specific gravity few bacteria and WBC 6-10.  Culture has been obtained.  Patient will be admitted for intractable nausea and vomiting and dehydration.   Review of Systems: As mentioned in the history of present illness. All other systems reviewed and are  negative. Past Medical History:  Diagnosis Date   Allergic rhinitis    Asthma, mild intermittent    followed by pcp    (prevously followed by dr Lucie Leatherkozlow w/ asthma/ allergy center)   Complication of anesthesia    difficulty waking up   Depression    GAD (generalized anxiety disorder)    GERD (gastroesophageal reflux disease)    Hiatal hernia    History of cardiomyopathy in adulthood 12/06/2021   in setting elevated troponin's w/ NSTEMI;  per echo  12-07-2021  ef 50-55%   History of heart murmur in childhood    since birth very slight   History of kidney stones    History of non-ST elevation myocardial infarction (NSTEMI) 12/06/2021   admission in epic---  elevated troponin's w/ NSTEMI  and stress cardiomyopathy,  12-07-2021 per cath normal coronaries ;  cardiology hospital follow-up with Bernadene PersonEmily Monge NP 12-26-2021   Hyperlipidemia, mixed    Hypertension    IDA (iron deficiency anemia)    OA (osteoarthritis)    thumb and fingers, knee, right hip   OSA (obstructive sleep apnea)    12-27-2021  per pt OSA yrs ago last used cpap approx. 2013,  stated retested this year and is waiting on cpap machine to arrive   Renal disorder    Type 2 diabetes mellitus (HCC) 2008   followed by pcp   (12-27-2021  pt sated checks blood sugar am daily fasting, average 155-170)   Wears glasses    Past Surgical History:  Procedure Laterality Date   BREAST EXCISIONAL BIOPSY Right  x2  last one 1980s , benign   CARPAL TUNNEL RELEASE Right 05/22/2017   Procedure: RIGHT CARPAL TUNNEL RELEASE;  Surgeon: Cindee Salt, MD;  Location: [facility];  Service: Orthopedics;  Laterality: Right;   CARPAL TUNNEL RELEASE Left 03/12/2018   Procedure: LEFT CARPAL TUNNEL RELEASE;  Surgeon: Cindee Salt, MD;  Location: [facility];  Service: Orthopedics;  Laterality: Left;   CYSTOSCOPY W/ URETERAL STENT PLACEMENT Bilateral 12/11/2021   Procedure: CYSTOSCOPY , BILATERAL RETROGRADE , BILATERAL STENT  PLACEMENT;  Surgeon: Sebastian Ache, MD;  [facility];  Service: Urology;  Laterality: Bilateral;   CYSTOSCOPY WITH RETROGRADE PYELOGRAM, URETEROSCOPY AND STENT PLACEMENT Bilateral 01/02/2022   Procedure: CYSTOSCOPY WITH RETROGRADE PYELOGRAM, URETEROSCOPY AND STENT REPLACEMENT;  Surgeon: Sebastian Ache, MD;  Location[facility];  Service: Urology;  Laterality: Bilateral;   DIAGNOSTIC LAPAROSCOPY     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   DILATION AND CURETTAGE OF UTERUS     yrs ago for missed ab   EXTRACORPOREAL SHOCK WAVE LITHOTRIPSY  03/2017   HOLMIUM LASER APPLICATION Bilateral 01/02/2022   Procedure: HOLMIUM LASER APPLICATION;  Surgeon: Sebastian Ache, MD;  Location: [facility];  Service: Urology;  Laterality: Bilateral;   LAPAROSCOPIC APPENDECTOMY  2008   LEFT HEART CATH AND CORONARY ANGIOGRAPHY N/A 12/07/2021   Procedure: LEFT HEART CATH AND CORONARY ANGIOGRAPHY;  Surgeon: Lyn Records, MD;  Location: [facility];  Service: Cardiovascular;  Laterality: N/A;   LIPOMA EXCISION  2003   right shoulder   SCLERAL BUCKLE  10/15/2011   Procedure: SCLERAL BUCKLE;  Surgeon: Sherrie George, MD;  Location: [facility] OR;  Service: Ophthalmology;  Laterality: Right;   SHOULDER ARTHROSCOPY WITH ROTATOR CUFF REPAIR Right 03/25/2019   Procedure: Right shoulder arthroscopy, subacromial decompression, distal clavicle resection, rotator cuff repair;  Surgeon: Francena Hanly, MD;  Location: [facility];  Service: Orthopedics;  Laterality: Right;    WISDOM TOOTH EXTRACTION     Social History:  reports that she has never smoked. She has never used smokeless tobacco. She reports that she does not drink alcohol and does not use drugs.  Allergies  Allergen Reactions   Amoxicillin Hives    Did it involve swelling of the face/tongue/throat, SOB, or low BP? No Did it involve sudden or severe rash/hives, skin peeling, or any reaction on the inside of your mouth or nose? No Did you  need to seek medical attention at a hospital or doctor's office? No When did it last happen?      15-20 years ago If all above answers are "NO", may proceed with cephalosporin use.    Aspirin Swelling   Bee Venom Other (See Comments)    Causes cellulitis   Clarithromycin Other (See Comments)    Gi upset   Erythromycin Nausea Only and Other (See Comments)    Stomach pain, also   Ibuprofen Swelling   Oxycodone Itching   Sulfa Antibiotics Hives   Latex Rash   Tape Rash and Other (See Comments)    Band-Aids, also = blisters    Family History  Problem Relation Age of Onset   Unexplained death Sister 45       died in her sleep   Colon cancer Paternal Grandmother    Colon cancer Paternal Uncle    Allergic rhinitis Neg Hx    Angioedema Neg Hx    Asthma Neg Hx    Atopy Neg Hx    Eczema Neg Hx  Immunodeficiency Neg Hx    Urticaria Neg Hx    Colon polyps Neg Hx    Esophageal cancer Neg Hx    Stomach cancer Neg Hx    Rectal cancer Neg Hx    Breast cancer Neg Hx     Prior to Admission medications   Medication Sig Start Date End Date Taking? Authorizing Provider  acetaminophen (TYLENOL) 500 MG tablet Take 500 mg by mouth every 6 (six) hours as needed for mild pain.   Yes [provider]  albuterol (PROAIR HFA) 108 (90 Base) MCG/ACT inhaler Can inhale two puffs every four to six hours as needed for cough or wheeze. Patient taking differently: Inhale 1 puff into the lungs as needed for shortness of breath. 06/08/20  Yes Kozlow, Alvira Philips, MD  amLODipine (NORVASC) 5 MG tablet Take 1 tablet (5 mg total) by mouth daily. Patient taking differently: Take 5 mg by mouth daily. 12/16/21  Yes Regalado, Belkys A, MD  atorvastatin (LIPITOR) 40 MG tablet Take 40 mg by mouth at bedtime.   Yes [provider]  b complex vitamins tablet Take 1 tablet by mouth at bedtime.   Yes [provider]  citalopram (CELEXA) 40 MG tablet Take 20 mg by mouth daily.   Yes [provider]  fenofibrate (TRICOR) 145 MG tablet Take 145 mg by mouth at bedtime. 09/22/19  Yes [provider]  fesoterodine (TOVIAZ) 4 MG TB24 tablet Take 1 tablet (4 mg total) by mouth daily. 12/15/21 01/14/22 Yes Regalado, Belkys A, MD  glimepiride (AMARYL) 1 MG tablet Take 0.5 mg by mouth 2 (two) times daily. 02/15/19  Yes [provider]  HYDROcodone-acetaminophen (NORCO/VICODIN) 5-325 MG tablet Take 1-2 tablets by mouth every 6 (six) hours as needed for moderate pain or severe pain (post-operatively). Patient taking differently: Take 1 tablet by mouth every 6 (six) hours as needed for moderate pain or severe pain (post-operatively). 01/02/22 01/02/23 Yes Sebastian Ache, MD  montelukast (SINGULAIR) 10 MG tablet TAKE 1 TABLET BY MOUTH EVERY DAY Patient taking differently: Take 10 mg by mouth at bedtime. 07/19/19  Yes Kozlow, Alvira Philips, MD  omeprazole (PRILOSEC) 40 MG capsule TAKE 1 CAPSULE BY MOUTH EVERY DAY IN THE MORNING BEFORE BREAKFAST Patient taking differently: Take 40 mg by mouth daily. 07/30/21  Yes Kozlow, Alvira Philips, MD  ondansetron (ZOFRAN-ODT) 4 MG disintegrating tablet Take 4 mg by mouth every 8 (eight) hours as needed for nausea or vomiting.   Yes [provider]  oxybutynin (DITROPAN) 5 MG tablet Take 1 tablet (5 mg total) by mouth 3 (three) times daily for 7 days. Patient taking differently: Take 5 mg by mouth daily. 12/31/21 01/07/22 Yes Loeffler, Grace C, PA-C  OZEMPIC, 1 MG/DOSE, 4 MG/3ML SOPN Inject 2 mg into the skin once a week. Wednesday's 05/25/20  Yes [provider]  tamsulosin (FLOMAX) 0.4 MG CAPS capsule Take 1 capsule (0.4 mg total) by mouth daily after supper. Patient taking differently: Take 0.4 mg by mouth daily after supper. 12/15/21  Yes Regalado, Belkys A, MD  triamcinolone (NASACORT) 55 MCG/ACT AERO nasal inhaler Place 1 spray into the nose daily as needed (allergies).   Yes [provider]  Vitamin D, Ergocalciferol, (DRISDOL)  50000 UNITS CAPS capsule Take 50,000 Units by mouth every 30 (thirty) days.    Yes [provider]  cetirizine (ZYRTEC) 10 MG tablet Take 10 mg by mouth daily. Patient not taking: Reported on 12/27/2021    [provider]  Ferrous  Sulfate (SLOW FE PO) Take 1 tablet by mouth daily.    [provider]  ketorolac (TORADOL) 10 MG tablet Take 1 tablet (10 mg total) by mouth every 8 (eight) hours as needed for moderate pain (or stent discomfort post-operatively). Patient not taking: Reported on 01/04/2022 01/02/22   Alexis Frock, MD  nitrofurantoin, macrocrystal-monohydrate, (MACROBID) 100 MG capsule Take 1 capsule (100 mg total) by mouth 2 (two) times daily for 3 days. Begin day before next Urology appointment. Patient not taking: Reported on 01/04/2022 01/02/22 01/05/22  Alexis Frock, MD  ondansetron (ZOFRAN-ODT) 8 MG disintegrating tablet Take 1 tablet (8 mg total) by mouth every 8 (eight) hours as needed for nausea or vomiting. Patient not taking: Reported on 01/04/2022 01/02/22   Alexis Frock, MD    Physical Exam: Vitals:   01/04/22 0210 01/04/22 0251 01/04/22 0604 01/04/22 0918  BP: 128/72 135/72 126/72 (!) 146/55  Pulse: 81 78 78 65  Resp: 17 14 16 16   Temp: 98.3 F (36.8 C) 98.3 F (36.8 C) 98.4 F (36.9 C) 98.3 F (36.8 C)  TempSrc: Oral Oral Oral Oral  SpO2: 100% 97% 96% 97%  Weight:      Height:       Constitutional: NAD, calm, comfortable Eyes: PERRL, lids and conjunctivae normal ENMT: Mucous membranes are moist. Posterior pharynx clear of any exudate or lesions.Normal dentition.  Neck: normal, supple, no masses, no thyromegaly Respiratory: clear to auscultation bilaterally, no wheezing, no crackles. Normal respiratory effort. No accessory muscle use.  Cardiovascular: Regular rate and rhythm, no murmurs / rubs / gallops. No extremity edema. 2+ pedal pulses. No carotid bruits.  Abdomen: no tenderness, no masses palpated. No hepatosplenomegaly. Bowel  sounds positive.  Musculoskeletal: no clubbing / cyanosis. No joint deformity upper and lower extremities. Good ROM, no contractures. Normal muscle tone.  Skin: no rashes, lesions, ulcers. No induration Neurologic: CN 2-12 grossly intact. Sensation intact, DTR normal. Strength 5/5 x all 4 extremities.  Psychiatric: Normal judgment and insight. Alert and oriented x 3. Normal mood.   Data Reviewed:  Lab data: Sodium 138, potassium 3.1, chloride 102, CO2 25, glucose 162, BUN 13, creatinine 1.18, anion gap 11, magnesium 1.4, LFTs normal, BC within normal limits, urinalysis as described in HPI, urine culture pending, orders just written for blood cultures.  Diagnostics: CT renal stone study: Bilateral ureteral stents in place and stable since prior study.  Bilateral nephrolithiasis without hydronephrosis.  No other acute findings in abdomen or pelvis.  EKG: Sinus rhythm with a ventricular rate of 76 bpm, QTc 476 ms  Assessment and Plan: Intractable nausea and vomiting/known GERD History of patient GI symptoms seem to primarily be related to pain and no definitive GI cause. GI symptoms such as her reflux have been exacerbated by recurrent nausea and vomiting N.p.o. except for ice chips IV Protonix, IV Pepcid, Carafate Scheduled Zofran, as needed Phenergan D5 normal saline with 20 of KCl at 125 cc/h  Back and bilateral lower pelvic pain Patient reports onset after placement of stents Imaging reveals appropriate stent placement without hydronephrosis Pain contributing to above symptoms IV morphine until able to tolerate solid diet then will change to Vicodin which patient states she has been able to tolerate in the past.  Preadmission pain not controlled by Tylenol  Recent ureteral stents Continue preadmission Flomax, Toviaz, and oxybutynin  Frequent stools Currently not consistent with C. difficile noting no severe abdominal pain and no fevers Continue to  follow  Hypokalemia/hypomagnesemia Replete and follow labs  Diabetes mellitus 2 Hold home agents Follow CBGs every 4 hours Provide SSI Hemoglobin A1c  Hypertension Hold home Norvasc until volume replaced  Dyslipidemia Hold home meds until able to tolerate solid diet   Advance Care Planning:   Code Status: Full Code   DVT prophylaxis: SCDs, no pharmacological prophylaxis since patient has hematuria  Consults: None  Family Communication: Husband at bedside  Severity of Illness: The appropriate patient status for this patient is INPATIENT. Inpatient status is judged to be reasonable and necessary in order to provide the required intensity of service to ensure the patient's safety. The patient's presenting symptoms, physical exam findings, and initial radiographic and laboratory data in the context of their chronic comorbidities is felt to place them at high risk for further clinical deterioration. Furthermore, it is not anticipated that the patient will be medically stable for discharge from the hospital within 2 midnights of admission.   * I certify that at the point of admission it is my clinical judgment that the patient will require inpatient hospital care spanning beyond 2 midnights from the point of admission due to high intensity of service, high risk for further deterioration and high frequency of surveillance required.*  Author: Junious Silk, NP 01/04/2022 9:19 AM  For on call review [email or web address].

## 2022-01-04 NOTE — ED Provider Notes (Signed)
[facility] EMERGENCY DEPARTMENT Provider Note   CSN: [id number] Arrival date & time: 01/03/22  2144     History  Chief Complaint  Patient presents with   Emesis    Jade Perez is a 66 y.o. female.  Patient with history of NSTEMI on 08/31 and nephrolithiasis presents today with complaints of nausea, vomiting, and abdominal pain. She states that same has been ongoing persistently since 08/31. She was seen for same when she was found to have an NSTEMI as well as left sided nephrolithiasis. Had heart cath at that time which revealed patent vessels. Also had an echo that showed EF 50-55%. She then underwent bilateral ureteral stent placement on 9/5 and was discharged on 9/09. Throughout this hospital stay she had persistent nausea and vomiting that she states was not completely controlled upon her discharge. She was discharged with po Zofran and protonix for acid reflux. She then returned on 09/18 with nausea, vomiting and abdominal pain, had reassuring work-up and was discharged with ODT Zofran and Norco. She again returned on 9/25 with worsening symptoms and was discharged with oxybutynin for bladder spasms per urology recommendations. She then underwent lithotripsy with ureteral stent replacement with urology yesterday with the hopes that this would resolve her symptoms. She has also been on macrobid for UTI. She states that since then she has continued to have persistent pain and nausea and vomiting and is unable to tolerate oral intake. She still is unable to tolerate po despite home Zofran use. She is not able to swallow her home meds including her norco and protonix which she suspects is why she was so uncomfortable. States that her pain has been persistent but unchanged for the past several weeks. States that she has not had a bowel movement in 2 days but is passing normal flatus.   The history is provided by the patient. No language interpreter was used.  Emesis Associated  symptoms: abdominal pain        Home Medications Prior to Admission medications   Medication Sig Start Date End Date Taking? Authorizing Provider  ACCU-CHEK AVIVA PLUS test strip 1 each daily. 12/03/19   [provider]  acetaminophen (TYLENOL) 500 MG tablet Take 500 mg by mouth every 6 (six) hours as needed for mild pain.    [provider]  albuterol (PROAIR HFA) 108 (90 Base) MCG/ACT inhaler Can inhale two puffs every four to six hours as needed for cough or wheeze. Patient taking differently: Inhale 1 puff into the lungs every 6 (six) hours as needed for shortness of breath. 06/08/20   Kozlow, Alvira Philips, MD  amLODipine (NORVASC) 5 MG tablet Take 1 tablet (5 mg total) by mouth daily. Patient taking differently: Take 5 mg by mouth daily. 12/16/21   Regalado, Belkys A, MD  atorvastatin (LIPITOR) 40 MG tablet Take 40 mg by mouth at bedtime.    [provider]  b complex vitamins tablet Take 1 tablet by mouth at bedtime.    [provider]  cetirizine (ZYRTEC) 10 MG tablet Take 10 mg by mouth daily. Patient not taking: Reported on 12/27/2021    [provider]  citalopram (CELEXA) 40 MG tablet Take 20 mg by mouth daily.    [provider]  fenofibrate (TRICOR) 145 MG tablet Take 145 mg by mouth daily. 09/22/19   [provider]  Ferrous Sulfate (SLOW FE PO) Take 1 tablet by mouth daily.    [provider]  fesoterodine (TOVIAZ) 4 MG  TB24 tablet Take 1 tablet (4 mg total) by mouth daily. 12/15/21 01/14/22  Regalado, Belkys A, MD  FIBER PO Take 3 tablets by mouth daily.     [provider]  glimepiride (AMARYL) 1 MG tablet Take 0.5 mg by mouth 2 (two) times daily. 02/15/19   [provider]  HYDROcodone-acetaminophen (NORCO/VICODIN) 5-325 MG tablet Take 1-2 tablets by mouth every 6 (six) hours as needed for moderate pain or severe pain (post-operatively). 01/02/22 01/02/23  Sebastian Ache, MD  ketorolac (TORADOL) 10 MG  tablet Take 1 tablet (10 mg total) by mouth every 8 (eight) hours as needed for moderate pain (or stent discomfort post-operatively). 01/02/22   Sebastian Ache, MD  montelukast (SINGULAIR) 10 MG tablet TAKE 1 TABLET BY MOUTH EVERY DAY Patient taking differently: Take 10 mg by mouth at bedtime. 07/19/19   Kozlow, Alvira Philips, MD  Multiple Vitamin (MULTIVITAMIN WITH MINERALS) TABS Take 1 tablet by mouth daily.    [provider]  nitrofurantoin, macrocrystal-monohydrate, (MACROBID) 100 MG capsule Take 1 capsule (100 mg total) by mouth 2 (two) times daily for 3 days. Begin day before next Urology appointment. 01/02/22 01/05/22  Sebastian Ache, MD  Omega-3 Fatty Acids (FISH OIL PO) Take 1,000 mg by mouth 2 (two) times daily.    [provider]  omeprazole (PRILOSEC) 40 MG capsule TAKE 1 CAPSULE BY MOUTH EVERY DAY IN THE MORNING BEFORE BREAKFAST Patient taking differently: Take 40 mg by mouth daily. 07/30/21   Kozlow, Alvira Philips, MD  ondansetron (ZOFRAN-ODT) 8 MG disintegrating tablet Take 1 tablet (8 mg total) by mouth every 8 (eight) hours as needed for nausea or vomiting. 01/02/22   Sebastian Ache, MD  oxybutynin (DITROPAN) 5 MG tablet Take 1 tablet (5 mg total) by mouth 3 (three) times daily for 7 days. 12/31/21 01/07/22  Loeffler, Grace C, PA-C  OZEMPIC, 1 MG/DOSE, 4 MG/3ML SOPN Inject 2 mg into the skin once a week. Wednesday's 05/25/20   [provider]  tamsulosin (FLOMAX) 0.4 MG CAPS capsule Take 1 capsule (0.4 mg total) by mouth daily after supper. Patient taking differently: Take 0.4 mg by mouth daily after supper. 12/15/21   Regalado, Belkys A, MD  triamcinolone (NASACORT) 55 MCG/ACT AERO nasal inhaler Place 2 sprays into the nose daily as needed (allergies).    [provider]  Vitamin D, Ergocalciferol, (DRISDOL) 50000 UNITS CAPS capsule Take 50,000 Units by mouth every 30 (thirty) days.     [provider]      Allergies    Amoxicillin, Aspirin, Bee venom,  Clarithromycin, Erythromycin, Ibuprofen, Oxycodone, Sulfa antibiotics, Latex, and Tape    Review of Systems   Review of Systems  Gastrointestinal:  Positive for abdominal pain, nausea and vomiting.  All other systems reviewed and are negative.   Physical Exam Updated Vital Signs BP (!) 159/79   Pulse 73   Temp 99.5 F (37.5 C) (Oral)   Resp 14   Ht 5\' 2"  (1.575 m)   Wt 83.9 kg   SpO2 99%   BMI 33.84 kg/m  Physical Exam Vitals and nursing note reviewed.  Constitutional:      General: She is not in acute distress.    Appearance: Normal appearance. She is normal weight. She is not ill-appearing, toxic-appearing or diaphoretic.  HENT:     Head: Normocephalic and atraumatic.  Cardiovascular:     Rate and Rhythm: Normal rate and regular rhythm.     Heart sounds: Normal heart sounds.  Pulmonary:  Effort: Pulmonary effort is normal. No respiratory distress.     Breath sounds: Normal breath sounds.  Abdominal:     General: Abdomen is flat.     Palpations: Abdomen is soft.     Tenderness: There is abdominal tenderness. There is no guarding or rebound.  Musculoskeletal:        General: Normal range of motion.     Cervical back: Normal range of motion.     Right lower leg: No edema.     Left lower leg: No edema.  Skin:    General: Skin is warm and dry.  Neurological:     General: No focal deficit present.     Mental Status: She is alert.  Psychiatric:        Mood and Affect: Mood normal.        Behavior: Behavior normal.     ED Results / Procedures / Treatments   Labs (all labs ordered are listed, but only abnormal results are displayed) Labs Reviewed  COMPREHENSIVE METABOLIC PANEL - Abnormal; Notable for the following components:      Result Value   Potassium 3.1 (*)    Glucose, Bld 162 (*)    Creatinine, Ser 1.18 (*)    GFR, Estimated 51 (*)    All other components within normal limits  URINALYSIS, ROUTINE W REFLEX MICROSCOPIC - Abnormal; Notable for the  following components:   Color, Urine BROWN (*)    APPearance CLOUDY (*)    Hgb urine dipstick LARGE (*)    Protein, ur >=300 (*)    Leukocytes,Ua SMALL (*)    All other components within normal limits  URINALYSIS, MICROSCOPIC (REFLEX) - Abnormal; Notable for the following components:   Bacteria, UA FEW (*)    All other components within normal limits  URINE CULTURE  CBC    EKG None  Radiology CT Renal Stone Study  Result Date: 01/03/2022 CLINICAL DATA:  Flank pain, kidney stone suspected. Vomiting after lithotripsy and stent placement. EXAM: CT ABDOMEN AND PELVIS WITHOUT CONTRAST TECHNIQUE: Multidetector CT imaging of the abdomen and pelvis was performed following the standard protocol without IV contrast. RADIATION DOSE REDUCTION: This exam was performed according to the departmental dose-optimization program which includes automated exposure control, adjustment of the mA and/or kV according to patient size and/or use of iterative reconstruction technique. COMPARISON:  12/31/2021 FINDINGS: Lower chest: No acute abnormality.  Small hiatal hernia. Hepatobiliary: No focal hepatic abnormality. Gallbladder unremarkable. Pancreas: No focal abnormality or ductal dilatation. Spleen: No focal abnormality.  Normal size. Adrenals/Urinary Tract: Adrenal glands normal. Bilateral ureteral stents in place. Numerous bilateral renal stones. No hydronephrosis. Urinary bladder unremarkable. Stomach/Bowel: Left colonic diverticulosis. No active diverticulitis. Stomach and small bowel decompressed, grossly unremarkable. Vascular/Lymphatic: No evidence of aneurysm or adenopathy. Reproductive: Uterus and adnexa unremarkable.  No mass. Other: No free fluid or free air. Musculoskeletal: No acute bony abnormality. IMPRESSION: Bilateral ureteral stents in place, stable since prior study. Bilateral nephrolithiasis. No hydronephrosis. No change. No acute findings in the abdomen or pelvis. Electronically Signed   By: Rolm Baptise M.D.   On: 01/03/2022 23:17    Procedures Procedures    Medications Ordered in ED Medications  alum & mag hydroxide-simeth (MAALOX/MYLANTA) 200-200-20 MG/5ML suspension 30 mL (has no administration in time range)  sodium chloride 0.9 % bolus 1,000 mL (1,000 mLs Intravenous New Bag/Given 01/03/22 2249)  ondansetron (ZOFRAN) injection 4 mg (4 mg Intravenous Given 01/03/22 2247)  morphine (PF) 4 MG/ML injection 4 mg (4  mg Intravenous Given 01/03/22 2247)    ED Course/ Medical Decision Making/ A&P                           Medical Decision Making Amount and/or Complexity of Data Reviewed Labs: ordered. Radiology: ordered.  Risk OTC drugs. Prescription drug management.   This patient presents to the ED for concern of nausea, vomiting, and abdominal pain, this involves an extensive number of treatment options, and is a complaint that carries with it a high risk of complications and morbidity.     Co morbidities that complicate the patient evaluation  Hx NSTEMI and nephrolithiasis   Additional history obtained:  Additional history obtained from epic chart review   Lab Tests:  I Ordered, and personally interpreted labs.  The pertinent results include:  K 3.1, creatinine 1.18 unchanged from previous. UA unchanged from previous, not overwhelmingly impressive for infection.  Culture pending.  No other acute laboratory findings.   Imaging Studies ordered:  I ordered imaging studies including CT renal I independently visualized and interpreted imaging which showed  Bilateral ureteral stents in place, stable since prior study. Bilateral nephrolithiasis. No hydronephrosis. No change.  No acute findings in the abdomen or pelvis. I agree with the radiologist interpretation   Cardiac Monitoring: / EKG:  The patient was maintained on a cardiac monitor.  I personally viewed and interpreted the cardiac monitored which showed an underlying rhythm of: sinus rhythm   Problem  List / ED Course / Critical interventions / Medication management  I ordered medication including morphine, zofran, droperidol, morphine, and fluids  for pain and nausea/vomiting and dehydration  Reevaluation of the patient after these medicines showed that the patient improved I have reviewed the patients home medicines and have made adjustments as needed   Test / Admission - Considered:  Patient presents today with persistent nausea, vomiting, and abdominal pain over the past month. After IV nausea and pain meds as well as fluids, patient states that her pain is persistent. Given that this is her 4th hospital visit in the past month for similar symptoms which have not been controlled on po, patient will need admission. Patient is understanding and amenable with plan.   Discussed patient with hospitalist who agrees to admit   Final Clinical Impression(s) / ED Diagnoses Final diagnoses:  Intractable nausea and vomiting  Hypokalemia    Rx / DC Orders ED Discharge Orders     None         Vear Clock 01/04/22 0034    Virgina Norfolk, DO 01/04/22 1154

## 2022-01-04 NOTE — TOC Progression Note (Signed)
Transition of Care [facility]) - Progression Note    Patient Details  Name: Jade Perez MRN: [id number] Date of Birth: 1955-11-07  [facility] CM/SW Contact  Servando Snare, [place] Phone Number: 01/04/2022, 9:41 AM  Clinical Narrative:     Transition of Care [facility]) Screening Note   Patient Details  Name: Jade Perez Date of Birth: July 18, 1955   Transition of Care [facility]) CM/SW Contact:    Servando Snare, LCSW Phone Number: 01/04/2022, 9:41 AM    Transition of Care Department [facility]) has reviewed patient and no TOC needs have been identified at this time. We will continue to monitor patient advancement through interdisciplinary progression rounds. If new patient transition needs arise, please place a TOC consult.          Expected Discharge Plan and Services                                                 Social Determinants of Health (SDOH) Interventions Housing Interventions: Intervention Not Indicated  Readmission Risk Interventions     No data to display

## 2022-01-04 NOTE — Plan of Care (Signed)
  Problem: Activity: Goal: Ability to return to baseline activity level will improve Outcome: Progressing   

## 2022-01-05 DIAGNOSIS — R112 Nausea with vomiting, unspecified: Secondary | ICD-10-CM | POA: Diagnosis not present

## 2022-01-05 LAB — MAGNESIUM: Magnesium: 1.5 mg/dL — ABNORMAL LOW (ref 1.7–2.4)

## 2022-01-05 LAB — CBC WITH DIFFERENTIAL/PLATELET
Abs Immature Granulocytes: 0.01 10*3/uL (ref 0.00–0.07)
Basophils Absolute: 0 10*3/uL (ref 0.0–0.1)
Basophils Relative: 1 %
Eosinophils Absolute: 0.3 10*3/uL (ref 0.0–0.5)
Eosinophils Relative: 7 %
HCT: 31.5 % — ABNORMAL LOW (ref 36.0–46.0)
Hemoglobin: 10.3 g/dL — ABNORMAL LOW (ref 12.0–15.0)
Immature Granulocytes: 0 %
Lymphocytes Relative: 39 %
Lymphs Abs: 1.7 10*3/uL (ref 0.7–4.0)
MCH: 31 pg (ref 26.0–34.0)
MCHC: 32.7 g/dL (ref 30.0–36.0)
MCV: 94.9 fL (ref 80.0–100.0)
Monocytes Absolute: 0.6 10*3/uL (ref 0.1–1.0)
Monocytes Relative: 14 %
Neutro Abs: 1.7 10*3/uL (ref 1.7–7.7)
Neutrophils Relative %: 39 %
Platelets: 271 10*3/uL (ref 150–400)
RBC: 3.32 MIL/uL — ABNORMAL LOW (ref 3.87–5.11)
RDW: 12.9 % (ref 11.5–15.5)
WBC: 4.4 10*3/uL (ref 4.0–10.5)
nRBC: 0 % (ref 0.0–0.2)

## 2022-01-05 LAB — BASIC METABOLIC PANEL
Anion gap: 5 (ref 5–15)
BUN: 7 mg/dL — ABNORMAL LOW (ref 8–23)
CO2: 24 mmol/L (ref 22–32)
Calcium: 8.4 mg/dL — ABNORMAL LOW (ref 8.9–10.3)
Chloride: 109 mmol/L (ref 98–111)
Creatinine, Ser: 0.99 mg/dL (ref 0.44–1.00)
GFR, Estimated: >60 mL/min (ref >60)
Glucose, Bld: 137 mg/dL — ABNORMAL HIGH (ref 70–99)
Potassium: 3.9 mmol/L (ref 3.5–5.1)
Sodium: 138 mmol/L (ref 135–145)

## 2022-01-05 LAB — URINE CULTURE: Culture: NO GROWTH

## 2022-01-05 LAB — GLUCOSE, CAPILLARY
Glucose-Capillary: 131 mg/dL — ABNORMAL HIGH (ref 70–99)
Glucose-Capillary: 149 mg/dL — ABNORMAL HIGH (ref 70–99)
Glucose-Capillary: 151 mg/dL — ABNORMAL HIGH (ref 70–99)

## 2022-01-05 MED ORDER — MAGNESIUM SULFATE 4 GM/100ML IV SOLN
4.0000 g | Freq: Once | INTRAVENOUS | Status: AC
  Administered 2022-01-05: 4 g via INTRAVENOUS
  Filled 2022-01-05: qty 100

## 2022-01-05 MED ORDER — SODIUM CHLORIDE (PF) 0.9 % IJ SOLN
INTRAMUSCULAR | Status: AC
  Administered 2022-01-05: 10 mL
  Filled 2022-01-05: qty 10

## 2022-01-05 NOTE — Progress Notes (Signed)
Discharge instructions given verbal and written. All questions answered. Patient in stable condition. Personal belongings with patient at time of d/c.

## 2022-01-05 NOTE — Discharge Summary (Signed)
Physician Discharge Summary   Mahaley Schwering WUX:[id number] DOB: [birth year] DOA: 01/03/2022  PCP: Marco Collie, MD  Admit date: 01/03/2022 Discharge date: 01/05/2022 Barriers to discharge: none  Admitted From: Home Disposition:  Home Discharging physician: Dwyane Dee, MD  Recommendations for Outpatient Follow-up:  Follow up with urology as scheduled   Home Health:  Equipment/Devices:   Discharge Condition: stable CODE STATUS: Full Diet recommendation:  Diet Orders (From admission, onward)     Start     Ordered   01/05/22 0733  Diet regular Room service appropriate? Yes; Fluid consistency: Thin  Diet effective now       Question Answer Comment  Room service appropriate? Yes   Fluid consistency: Thin      01/05/22 0732   01/05/22 0000  Diet general        01/05/22 1329            Hospital Course:  Intractable nausea and vomiting/known GERD History of patient GI symptoms seem to primarily be related to pain and no definitive GI cause. GI symptoms such as her reflux have been exacerbated by recurrent nausea and vomiting - diet slowly advanced and tolerated soft diet prior to discharge -Patient instructed to continue antiemetics and pain control discharge   Back and bilateral lower pelvic pain -Improved CVA tenderness s/p stent placement -Overall doing well.  Patient already has prescription for Vicodin for use at home - Outpatient follow-up with urology as scheduled   Recent ureteral stents Continue preadmission Flomax, Toviaz, and oxybutynin   Frequent stools Currently not consistent with C. difficile noting no severe abdominal pain and no fevers   Hypokalemia/hypomagnesemia Repleted   Diabetes mellitus 2 - continue home regimen   Hypertension -Continue home regimen   Dyslipidemia -Continue home regimen    The patient's chronic medical conditions were treated accordingly per the patient's home medication regimen except as noted.  On day of  discharge, patient was felt deemed stable for discharge. Patient/family member advised to call PCP or come back to ER if needed.   Principal Diagnosis: Intractable nausea and vomiting  Discharge Diagnoses: Active Hospital Problems   Diagnosis Date Noted   Intractable nausea and vomiting 01/04/2022    Resolved Hospital Problems  No resolved problems to display.     Discharge Instructions     Diet general   Complete by: As directed    Increase activity slowly   Complete by: As directed    No wound care   Complete by: As directed       Allergies as of 01/05/2022       Reactions   Amoxicillin Hives   Did it involve swelling of the face/tongue/throat, SOB, or low BP? No Did it involve sudden or severe rash/hives, skin peeling, or any reaction on the inside of your mouth or nose? No Did you need to seek medical attention at a hospital or doctor's office? No When did it last happen?      15-20 years ago If all above answers are "NO", may proceed with cephalosporin use.   Aspirin Swelling   Bee Venom Other (See Comments)   Causes cellulitis   Clarithromycin Other (See Comments)   Gi upset   Erythromycin Nausea Only, Other (See Comments)   Stomach pain, also   Ibuprofen Swelling   Oxycodone Itching   Sulfa Antibiotics Hives   Latex Rash   Tape Rash, Other (See Comments)   Band-Aids, also = blisters        Medication  List     STOP taking these medications    ketorolac 10 MG tablet Commonly known as: TORADOL   nitrofurantoin (macrocrystal-monohydrate) 100 MG capsule Commonly known as: Macrobid       TAKE these medications    acetaminophen 500 MG tablet Commonly known as: TYLENOL Take 500 mg by mouth every 6 (six) hours as needed for mild pain.   albuterol 108 (90 Base) MCG/ACT inhaler Commonly known as: ProAir HFA Can inhale two puffs every four to six hours as needed for cough or wheeze. What changed:  how much to take how to take this when to take  this reasons to take this additional instructions   amLODipine 5 MG tablet Commonly known as: NORVASC Take 1 tablet (5 mg total) by mouth daily.   atorvastatin 40 MG tablet Commonly known as: LIPITOR Take 40 mg by mouth at bedtime.   b complex vitamins tablet Take 1 tablet by mouth at bedtime.   cetirizine 10 MG tablet Commonly known as: ZYRTEC Take 10 mg by mouth daily.   citalopram 40 MG tablet Commonly known as: CELEXA Take 20 mg by mouth daily.   fenofibrate 145 MG tablet Commonly known as: TRICOR Take 145 mg by mouth at bedtime.   fesoterodine 4 MG Tb24 tablet Commonly known as: TOVIAZ Take 1 tablet (4 mg total) by mouth daily.   glimepiride 1 MG tablet Commonly known as: AMARYL Take 0.5 mg by mouth 2 (two) times daily.   HYDROcodone-acetaminophen 5-325 MG tablet Commonly known as: NORCO/VICODIN Take 1-2 tablets by mouth every 6 (six) hours as needed for moderate pain or severe pain (post-operatively). What changed: how much to take   montelukast 10 MG tablet Commonly known as: SINGULAIR TAKE 1 TABLET BY MOUTH EVERY DAY What changed: when to take this   omeprazole 40 MG capsule Commonly known as: PRILOSEC TAKE 1 CAPSULE BY MOUTH EVERY DAY IN THE MORNING BEFORE BREAKFAST What changed: See the new instructions.   ondansetron 4 MG disintegrating tablet Commonly known as: ZOFRAN-ODT Take 4 mg by mouth every 8 (eight) hours as needed for nausea or vomiting.   ondansetron 8 MG disintegrating tablet Commonly known as: ZOFRAN-ODT Take 1 tablet (8 mg total) by mouth every 8 (eight) hours as needed for nausea or vomiting.   oxybutynin 5 MG tablet Commonly known as: DITROPAN Take 1 tablet (5 mg total) by mouth 3 (three) times daily for 7 days. What changed: when to take this   Ozempic (1 MG/DOSE) 4 MG/3ML Sopn Generic drug: Semaglutide (1 MG/DOSE) Inject 2 mg into the skin once a week. Wednesday's   SLOW FE PO Take 1 tablet by mouth daily.   tamsulosin  0.4 MG Caps capsule Commonly known as: FLOMAX Take 1 capsule (0.4 mg total) by mouth daily after supper.   triamcinolone 55 MCG/ACT Aero nasal inhaler Commonly known as: NASACORT Place 1 spray into the nose daily as needed (allergies).   Vitamin D (Ergocalciferol) 1.25 MG (50000 UNIT) Caps capsule Commonly known as: DRISDOL Take 50,000 Units by mouth every 30 (thirty) days.        Allergies  Allergen Reactions   Amoxicillin Hives    Did it involve swelling of the face/tongue/throat, SOB, or low BP? No Did it involve sudden or severe rash/hives, skin peeling, or any reaction on the inside of your mouth or nose? No Did you need to seek medical attention at a hospital or doctor's office? No When did it last happen?  15-20 years ago If all above answers are "NO", may proceed with cephalosporin use.    Aspirin Swelling   Bee Venom Other (See Comments)    Causes cellulitis   Clarithromycin Other (See Comments)    Gi upset   Erythromycin Nausea Only and Other (See Comments)    Stomach pain, also   Ibuprofen Swelling   Oxycodone Itching   Sulfa Antibiotics Hives   Latex Rash   Tape Rash and Other (See Comments)    Band-Aids, also = blisters    Consultations:   Procedures:   Discharge Exam: BP (!) 131/104 (BP Location: Right Arm)   Pulse 83   Temp 98 F (36.7 C) (Oral)   Resp 18   Ht 5\' 2"  (1.575 m)   Wt 83.9 kg   SpO2 100%   BMI 33.84 kg/m  Physical Exam Constitutional:      Appearance: Normal appearance.  HENT:     Head: Normocephalic and atraumatic.     Mouth/Throat:     Mouth: Mucous membranes are moist.  Eyes:     Extraocular Movements: Extraocular movements intact.  Cardiovascular:     Rate and Rhythm: Normal rate and regular rhythm.  Pulmonary:     Effort: Pulmonary effort is normal.     Breath sounds: Normal breath sounds.  Abdominal:     General: Bowel sounds are normal. There is no distension.     Palpations: Abdomen is soft.      Tenderness: There is no abdominal tenderness.     Comments: Mild bilateral CVA tenderness  Musculoskeletal:        General: Normal range of motion.     Cervical back: Normal range of motion.  Skin:    General: Skin is warm and dry.  Neurological:     General: No focal deficit present.     Mental Status: She is alert.  Psychiatric:        Mood and Affect: Mood normal.        Behavior: Behavior normal.      The results of significant diagnostics from this hospitalization (including imaging, microbiology, ancillary and laboratory) are listed below for reference.   Microbiology: Recent Results (from the past 240 hour(s))  Urine Culture     Status: None   Collection Time: 01/03/22  9:58 PM   Specimen: Urine, Clean Catch  Result Value Ref Range Status   Specimen Description   Final    URINE, CLEAN CATCH Performed at [facility], [address] [place], [place] [address]    Special Requests   Final    NONE Performed at [facility], [address] [place], [place] [address]    Culture   Final    NO GROWTH Performed at [facility], [place] [address], [place], [place] [address]    Report Status 01/05/2022 FINAL  Final  Culture, blood (Routine X 2) w Reflex to ID Panel     Status: None (Preliminary result)   Collection Time: 01/04/22  7:26 AM   Specimen: BLOOD RIGHT HAND  Result Value Ref Range Status   Specimen Description   Final    BLOOD RIGHT HAND Performed at [facility] [address], [place], [place] [address]    Special Requests   Final    BOTTLES DRAWN AEROBIC AND ANAEROBIC Blood Culture adequate volume Performed at [facility] [address], [place],  [address]    Culture  Final    NO GROWTH 1 DAY Performed at [facility], [place] [address], [place], [place] [address]    Report Status PENDING  Incomplete  Culture, blood (Routine X 2) w Reflex to ID Panel     Status: None  (Preliminary result)   Collection Time: 01/04/22  7:31 AM   Specimen: BLOOD LEFT ARM  Result Value Ref Range Status   Specimen Description   Final    BLOOD LEFT ARM Performed at [facility] [address], [place], [place] [address]    Special Requests   Final    BOTTLES DRAWN AEROBIC ONLY Blood Culture results may not be optimal due to an inadequate volume of blood received in culture bottles Performed at [facility] [address] [place], [place] [address]    Culture   Final    NO GROWTH 1 DAY Performed at [facility], [place] [address] [place], [place] [address]    Report Status PENDING  Incomplete     Labs: BNP (last 3 results) No results for input(s): "BNP" in the last 8760 hours. Basic Metabolic Panel: Recent Labs  Lab 12/31/21 1242 01/03/22 2158 01/05/22 0533  NA 138 138 138  K 3.4* 3.1* 3.9  CL 104 102 109  CO2 23 25 24   GLUCOSE 184* 162* 137*  BUN 13 13 7*  CREATININE 1.18* 1.18* 0.99  CALCIUM 9.9 10.3 8.4*  MG  --  1.4* 1.5*   Liver Function Tests: Recent Labs  Lab 12/31/21 1242 01/03/22 2158  AST 20 21  ALT 25 22  ALKPHOS 40 39  BILITOT 0.9 0.7  PROT 6.9 7.4  ALBUMIN 3.9 4.2   Recent Labs  Lab 01/04/22 0726  LIPASE 28   No results for input(s): "AMMONIA" in the last 168 hours. CBC: Recent Labs  Lab 12/31/21 1242 01/03/22 2158 01/05/22 0533  WBC 7.5 6.6 4.4  NEUTROABS 6.0  --  1.7  HGB 13.1 13.5 10.3*  HCT 38.7 38.8 31.5*  MCV 91.9 88.8 94.9  PLT 355 382 271   Cardiac Enzymes: No results for input(s): "CKTOTAL", "CKMB", "CKMBINDEX", "TROPONINI" in the last 168 hours. BNP: Invalid input(s): "POCBNP" CBG: Recent Labs  Lab 01/04/22 1929 01/04/22 2346 01/05/22 0326 01/05/22 0804 01/05/22 1156  GLUCAP 131* 126* 131* 149* 151*   D-Dimer No results for input(s): "DDIMER" in the last 72 hours. Hgb A1c Recent Labs    01/04/22 0731  HGBA1C 6.5*   Lipid Profile No results for  input(s): "CHOL", "HDL", "LDLCALC", "TRIG", "CHOLHDL", "LDLDIRECT" in the last 72 hours. Thyroid function studies No results for input(s): "TSH", "T4TOTAL", "T3FREE", "THYROIDAB" in the last 72 hours.  Invalid input(s): "FREET3" Anemia work up No results for input(s): "VITAMINB12", "FOLATE", "FERRITIN", "TIBC", "IRON", "RETICCTPCT" in the last 72 hours. Urinalysis    Component Value Date/Time   COLORURINE BROWN (A) 01/03/2022 2158   APPEARANCEUR CLOUDY (A) 01/03/2022 2158   LABSPEC 1.020 01/03/2022 2158   PHURINE >=9.0 01/03/2022 2158   GLUCOSEU NEGATIVE 01/03/2022 2158   HGBUR LARGE (A) 01/03/2022 2158   BILIRUBINUR NEGATIVE 01/03/2022 2158   KETONESUR NEGATIVE 01/03/2022 2158   PROTEINUR >=300 (A) 01/03/2022 2158   NITRITE NEGATIVE 01/03/2022 2158   LEUKOCYTESUR SMALL (A) 01/03/2022 2158   Sepsis Labs Recent Labs  Lab 12/31/21 1242 01/03/22 2158 01/05/22 0533  WBC 7.5 6.6 4.4   Microbiology Recent Results (from the past 240 hour(s))  Urine Culture     Status: None  Collection Time: 01/03/22  9:58 PM   Specimen: Urine, Clean Catch  Result Value Ref Range Status   Specimen Description   Final    URINE, CLEAN CATCH Performed at [facility], [address] [place], [place] [address]    Special Requests   Final    NONE Performed at [facility], [address] [place], [place] [address]    Culture   Final    NO GROWTH Performed at [facility], [address] [place], [place] [address]    Report Status 01/05/2022 FINAL  Final  Culture, blood (Routine X 2) w Reflex to ID Panel     Status: None (Preliminary result)   Collection Time: 01/04/22  7:26 AM   Specimen: BLOOD RIGHT HAND  Result Value Ref Range Status   Specimen Description   Final    BLOOD RIGHT HAND Performed at [facility], [address], [place], [place] [address]    Special Requests   Final    BOTTLES DRAWN AEROBIC AND ANAEROBIC Blood Culture  adequate volume Performed at [facility], [address], [place], [place] [address]    Culture   Final    NO GROWTH 1 DAY Performed at [facility], [address], [place], [place] [address]    Report Status PENDING  Incomplete  Culture, blood (Routine X 2) w Reflex to ID Panel     Status: None (Preliminary result)   Collection Time: 01/04/22  7:31 AM   Specimen: BLOOD LEFT ARM  Result Value Ref Range Status   Specimen Description   Final    BLOOD LEFT ARM Performed at [facility], [address] [place], [place] [address]    Special Requests   Final    BOTTLES DRAWN AEROBIC ONLY Blood Culture results may not be optimal due to an inadequate volume of blood received in culture bottles Performed at [facility], [address] [place], [place] [address]    Culture   Final    NO GROWTH 1 DAY Performed at [facility], [address] [place], [place] [address]    Report Status PENDING  Incomplete    Procedures/Studies: CT Renal Stone Study  Result Date: 01/03/2022 CLINICAL DATA:  Flank pain, kidney stone suspected. Vomiting after lithotripsy and stent placement. EXAM: CT ABDOMEN AND PELVIS WITHOUT CONTRAST TECHNIQUE: Multidetector CT imaging of the abdomen and pelvis was performed following the standard protocol without IV contrast. RADIATION DOSE REDUCTION: This exam was performed according to the departmental dose-optimization program which includes automated exposure control, adjustment of the mA and/or kV according to patient size and/or use of iterative reconstruction technique. COMPARISON:  12/31/2021 FINDINGS: Lower chest: No acute abnormality.  Small hiatal hernia. Hepatobiliary: No focal hepatic abnormality. Gallbladder unremarkable. Pancreas: No focal abnormality or ductal dilatation. Spleen: No focal abnormality.  Normal size. Adrenals/Urinary Tract: Adrenal glands normal. Bilateral ureteral stents in  place. Numerous bilateral renal stones. No hydronephrosis. Urinary bladder unremarkable. Stomach/Bowel: Left colonic diverticulosis. No active diverticulitis. Stomach and small bowel decompressed, grossly unremarkable. Vascular/Lymphatic: No evidence of aneurysm or adenopathy. Reproductive: Uterus and adnexa unremarkable.  No mass. Other: No free fluid or free air. Musculoskeletal: No acute bony abnormality. IMPRESSION: Bilateral ureteral stents in place, stable since prior study. Bilateral nephrolithiasis. No hydronephrosis. No change. No acute findings in the abdomen or pelvis. Electronically Signed   By: Charlett Nose M.D.  On: 01/03/2022 23:17   CT Renal Stone Study  Result Date: 12/31/2021 CLINICAL DATA:  Flank pain, kidney stone suspected EXAM: CT ABDOMEN AND PELVIS WITHOUT CONTRAST TECHNIQUE: Multidetector CT imaging of the abdomen and pelvis was performed following the standard protocol without IV contrast. RADIATION DOSE REDUCTION: This exam was performed according to the departmental dose-optimization program which includes automated exposure control, adjustment of the mA and/or kV according to patient size and/or use of iterative reconstruction technique. COMPARISON:  December 24, 2021 FINDINGS: Evaluation is limited by lack of IV contrast. Lower chest: No acute abnormality. Hepatobiliary: Hepatic steatosis. Unremarkable noncontrast appearance of the gallbladder. Pancreas: No peripancreatic fat stranding. Spleen: Unremarkable. Adrenals/Urinary Tract: Adrenal glands are unremarkable. Patient is status post bilateral nephroureteral stent placement. There is a mildly decreased degree of fat stranding adjacent to the course of bilateral ureters. No new hydronephrosis. Bilateral nonobstructing nephrolithiasis, similar in comparison to prior. Again, it is noted that the 7 mm nephrolithiasis of the LEFT kidney remains within the entrance to the inferior calyx and no longer obstructs at the UPJ. Bladder is  decompressed and unremarkable. Stomach/Bowel: No evidence of bowel obstruction. Scattered diverticulosis. Status post appendectomy. Moderate hiatal hernia. Vascular/Lymphatic: No significant vascular findings are present. No enlarged abdominal or pelvic lymph nodes. Reproductive: Uterus and bilateral adnexa are unremarkable. Other: Small periumbilical fat containing hernia. Musculoskeletal: Degenerative changes of the lumbar spine with focal levocurvature centered at L2-3. IMPRESSION: 1. Stable positioning of bilateral nephroureteral stents without evidence of new obstructing nephrolithiasis. No new hydronephrosis. 2. Mildly decreased fat stranding adjacent to the course of bilateral nephroureteral stents in comparison to prior. Electronically Signed   By: Valentino Saxon M.D.   On: 12/31/2021 13:39   DG Chest 2 View  Result Date: 12/31/2021 CLINICAL DATA:  Chest pain. EXAM: CHEST - 2 VIEW COMPARISON:  December 24, 2021. FINDINGS: The heart size and mediastinal contours are within normal limits. Both lungs are clear. The visualized skeletal structures are unremarkable. IMPRESSION: No active cardiopulmonary disease. Electronically Signed   By: Marijo Conception M.D.   On: 12/31/2021 12:17   CT Renal Stone Study  Result Date: 12/24/2021 CLINICAL DATA:  Nausea and vomiting since Friday, urinary stents, bilateral flank pain EXAM: CT ABDOMEN AND PELVIS WITHOUT CONTRAST TECHNIQUE: Multidetector CT imaging of the abdomen and pelvis was performed following the standard protocol without IV contrast. RADIATION DOSE REDUCTION: This exam was performed according to the departmental dose-optimization program which includes automated exposure control, adjustment of the mA and/or kV according to patient size and/or use of iterative reconstruction technique. COMPARISON:  12/10/2021, 12/13/2021 FINDINGS: Lower chest: No acute pleural or parenchymal lung disease. Hepatobiliary: Stable hepatic steatosis. No focal  abnormalities on this unenhanced exam. Gallbladder is unremarkable. Pancreas: Unremarkable unenhanced appearance. Spleen: Unremarkable unenhanced appearance. Adrenals/Urinary Tract: There are multiple bilateral nonobstructing renal calculi. The obstructing left UPJ calculus seen previously has reflux into the lower pole left kidney measuring 7 mm. The other numerous bilateral nonobstructing calculi are stable in size and number, largest on the right measuring 5 mm. Bilateral ureteral stents are seen extending from the renal pelves to the bladder lumen. No evidence of ureteral or bladder calculi. No evidence of hydronephrosis or hydroureter. Interval development of bilateral retroperitoneal fat stranding along the course of the ureters may be due to recent procedure or underlying infection. The adrenals are unremarkable.  Bladder is grossly normal. Stomach/Bowel: No bowel obstruction or ileus. Scattered diverticulosis of the distal colon without diverticulitis. No bowel wall thickening or  inflammatory change. Small hiatal hernia. Prior appendectomy. Vascular/Lymphatic: No significant vascular findings are present. No enlarged abdominal or pelvic lymph nodes. Reproductive: Uterus and bilateral adnexa are unremarkable. Other: No free fluid or free intraperitoneal gas. Stable fat containing periumbilical ventral hernia. Musculoskeletal: No acute or destructive bony lesions. Reconstructed images demonstrate no additional findings. IMPRESSION: 1. Interval reflux of the obstructing left UPJ calculus into the lower pole collecting system left kidney. 2. Bilateral ureteral stent placement as above. Peri ureteral fat stranding within the retroperitoneum could be related to recent procedure or superimposed infection. 3. Other bilateral nonobstructing renal calculi as above. 4. Distal colonic diverticulosis without diverticulitis. 5. Small fat containing periumbilical ventral hernia and hiatal hernia. Electronically Signed    By: Randa Ngo M.D.   On: 12/24/2021 15:41   DG Chest Portable 1 View  Result Date: 12/24/2021 CLINICAL DATA:  66 year old female presents for evaluation of nausea and vomiting. EXAM: PORTABLE CHEST 1 VIEW COMPARISON:  December 06, 2021 FINDINGS: The heart size and mediastinal contours are within normal limits. Both lungs are clear. The visualized skeletal structures are unremarkable. IMPRESSION: No active disease. Electronically Signed   By: Zetta Bills M.D.   On: 12/24/2021 15:34   DG Abd 1 View  Result Date: 12/13/2021 CLINICAL DATA:  Vomiting, stents in place. EXAM: ABDOMEN - 1 VIEW COMPARISON:  CT of the abdomen and pelvis from September fourth of 2023 FINDINGS: Bowel gas pattern without signs of obstruction. Double-J ureteral stents are in place, proximal loops in the area of the renal pelvis, distal loops in the area of the urinary bladder. A 6 mm calculus has likely been pushed retrograde during placement of double-J ureteral stents into the LEFT renal pelvis from the LEFT UPJ as it was position on the study of December 10, 2021. A large phlebolith projects to the LEFT of the stent in the pelvis. Small RIGHT-sided and LEFT-sided urinary calculi elsewhere in the kidneys are similar to previous imaging. On limited assessment there is no acute skeletal finding. IMPRESSION: Double-J ureteral stents in place. Calculus adjacent to the proximal loop of the LEFT urinary tract stent. No acute findings. Electronically Signed   By: Zetta Bills M.D.   On: 12/13/2021 14:29   DG C-Arm 1-60 Min-No Report  Result Date: 12/11/2021 Fluoroscopy was utilized by the requesting physician.  No radiographic interpretation.   CT RENAL STONE STUDY  Result Date: 12/10/2021 CLINICAL DATA:  Flank pain, kidney stones suspected EXAM: CT ABDOMEN AND PELVIS WITHOUT CONTRAST TECHNIQUE: Multidetector CT imaging of the abdomen and pelvis was performed following the standard protocol without IV contrast. RADIATION DOSE  REDUCTION: This exam was performed according to the departmental dose-optimization program which includes automated exposure control, adjustment of the mA and/or kV according to patient size and/or use of iterative reconstruction technique. COMPARISON:  12/06/2021 FINDINGS: Lower chest: New trace right pleural effusion.  Small hiatal hernia. Hepatobiliary: No solid liver abnormality is seen. Hepatic steatosis. No gallstones, gallbladder wall thickening, or biliary dilatation. Pancreas: Unremarkable. No pancreatic ductal dilatation or surrounding inflammatory changes. Spleen: Normal in size without significant abnormality. Adrenals/Urinary Tract: Adrenal glands are unremarkable. Unchanged 0.8 cm calculus at the left ureteropelvic junction with mild associated left hydronephrosis. Numerous additional bilateral renal calculi. No right-sided hydronephrosis. Bladder is unremarkable. Stomach/Bowel: Stomach is within normal limits. Appendix is surgically absent. No evidence of bowel wall thickening, distention, or inflammatory changes. Descending and sigmoid diverticulosis. Vascular/Lymphatic: No significant vascular findings are present. No enlarged abdominal or pelvic lymph nodes. Reproductive:  No mass or other significant abnormality. Other: Small, fat containing midline ventral hernia (series 3, image 52). No ascites. Musculoskeletal: No acute or significant osseous findings. IMPRESSION: 1. Unchanged 0.8 cm calculus at the left ureteropelvic junction with mild associated left hydronephrosis. 2. Numerous additional bilateral renal calculi. No right-sided hydronephrosis. 3. Hepatic steatosis. 4. New trace right pleural effusion. Electronically Signed   By: Delanna Ahmadi M.D.   On: 12/10/2021 21:30   ECHOCARDIOGRAM COMPLETE  Result Date: 12/07/2021    ECHOCARDIOGRAM REPORT   Patient Name:   KYLEEN THIEN [facility] Date of Exam: 12/07/2021 Medical Rec #:  [id number]         Height:       62.0 in Accession #:    [id number]         Weight:       198.5 lb Date of Birth:  03-07-1956         BSA:          1.906 m Patient Age:    66 years          BP:           145/65 mmHg Patient Gender: F                 HR:           67 bpm. Exam Location:  Inpatient Procedure: 2D Echo and Intracardiac Opacification Agent Indications:    NSTEMI  History:        Patient has no prior history of Echocardiogram examinations.                 NSTEMI, Signs/Symptoms:Chest Pain; Risk Factors:Diabetes and                 Asthma.  Sonographer:    Harvie Junior Referring Phys: [id number] FANG XU  Sonographer Comments: Technically difficult study due to poor echo windows. IMPRESSIONS  1. Left ventricular ejection fraction, by estimation, is 50 to 55%. The left ventricle has low normal function. The left ventricle demonstrates regional wall motion abnormalities (see scoring diagram/findings for description). Left ventricular diastolic  parameters are consistent with Grade I diastolic dysfunction (impaired relaxation).  2. Right ventricular systolic function is normal. The right ventricular size is normal. Tricuspid regurgitation signal is inadequate for assessing PA pressure.  3. The mitral valve is normal in structure. Trivial mitral valve regurgitation. No evidence of mitral stenosis.  4. The aortic valve is tricuspid. Aortic valve regurgitation is not visualized. No aortic stenosis is present.  5. The inferior vena cava is normal in size with greater than 50% respiratory variability, suggesting right atrial pressure of 3 mmHg. FINDINGS  Left Ventricle: Left ventricular ejection fraction, by estimation, is 50 to 55%. The left ventricle has low normal function. The left ventricle demonstrates regional wall motion abnormalities. The left ventricular internal cavity size was normal in size. There is no left ventricular hypertrophy. Left ventricular diastolic parameters are consistent with Grade I diastolic dysfunction (impaired relaxation).  LV Wall Scoring: The antero-lateral  wall, inferior wall, and posterior wall are hypokinetic. Right Ventricle: The right ventricular size is normal. No increase in right ventricular wall thickness. Right ventricular systolic function is normal. Tricuspid regurgitation signal is inadequate for assessing PA pressure. The tricuspid regurgitant velocity is 1.37 m/s, and with an assumed right atrial pressure of 3 mmHg, the estimated right ventricular systolic pressure is [id number]. Left Atrium: Left atrial size was normal in size. Right Atrium: Right atrial size was normal  in size. Pericardium: There is no evidence of pericardial effusion. Mitral Valve: The mitral valve is normal in structure. Trivial mitral valve regurgitation. No evidence of mitral valve stenosis. Tricuspid Valve: The tricuspid valve is normal in structure. Tricuspid valve regurgitation is not demonstrated. No evidence of tricuspid stenosis. Aortic Valve: The aortic valve is tricuspid. Aortic valve regurgitation is not visualized. Aortic regurgitation PHT measures 718 msec. No aortic stenosis is present. Aortic valve mean gradient measures 3.0 mmHg. Aortic valve peak gradient measures 6.7 mmHg. Aortic valve area, by VTI measures 1.39 cm. Pulmonic Valve: The pulmonic valve was not well visualized. Pulmonic valve regurgitation is not visualized. No evidence of pulmonic stenosis. Aorta: The aortic root is normal in size and structure. Venous: The inferior vena cava is normal in size with greater than 50% respiratory variability, suggesting right atrial pressure of 3 mmHg. IAS/Shunts: The interatrial septum was not well visualized.  LEFT VENTRICLE PLAX 2D LVIDd:         4.90 cm      Diastology LVIDs:         3.60 cm      LV e' medial:    5.87 cm/s LV PW:         1.00 cm      LV E/e' medial:  11.0 LV IVS:        0.80 cm      LV e' lateral:   8.81 cm/s LVOT diam:     1.80 cm      LV E/e' lateral: 7.3 LV SV:         36 LV SV Index:   19 LVOT Area:     2.54 cm  LV Volumes (MOD) LV vol d, MOD  A2C: 104.8 ml LV vol d, MOD A4C: 118.0 ml LV vol s, MOD A2C: 48.6 ml LV vol s, MOD A4C: 58.4 ml LV SV MOD A2C:     56.2 ml LV SV MOD A4C:     118.0 ml LV SV MOD BP:      58.2 ml RIGHT VENTRICLE RV S prime:     15.10 cm/s TAPSE (M-mode): 2.0 cm LEFT ATRIUM             Index        RIGHT ATRIUM          Index LA diam:        3.00 cm 1.57 cm/m   RA Area:     7.49 cm LA Vol (A2C):   44.2 ml 23.20 ml/m  RA Volume:   12.70 ml 6.66 ml/m LA Vol (A4C):   32.5 ml 17.06 ml/m LA Biplane Vol: 39.4 ml 20.68 ml/m  AORTIC VALVE                    PULMONIC VALVE AV Area (Vmax):    1.50 cm     PV Vmax:       1.18 m/s AV Area (Vmean):   1.41 cm     PV Peak grad:  5.6 mmHg AV Area (VTI):     1.39 cm AV Vmax:           129.00 cm/s AV Vmean:          84.200 cm/s AV VTI:            0.260 m AV Peak Grad:      6.7 mmHg AV Mean Grad:      3.0 mmHg LVOT Vmax:  75.80 cm/s LVOT Vmean:        46.700 cm/s LVOT VTI:          0.142 m LVOT/AV VTI ratio: 0.55 AI PHT:            718 msec  AORTA Ao Root diam: 2.90 cm Ao Asc diam:  3.00 cm MITRAL VALVE               TRICUSPID VALVE MV Area (PHT): 3.53 cm    TR Peak grad:   7.5 mmHg MV Decel Time: 215 msec    TR Vmax:        137.00 cm/s MR Peak grad: 13.9 mmHg MR Vmax:      186.50 cm/s  SHUNTS MV E velocity: 64.30 cm/s  Systemic VTI:  0.14 m MV A velocity: 70.40 cm/s  Systemic Diam: 1.80 cm MV E/A ratio:  0.91 Cherlynn Kaiser MD Electronically signed by Cherlynn Kaiser MD Signature Date/Time: 12/07/2021/3:13:00 PM    Final    CARDIAC CATHETERIZATION  Result Date: 12/07/2021 CONCLUSIONS: Right dominant coronary anatomy.  Widely patent coronaries without evidence of obstructive disease. Suggestion of mild mid anterior wall hypokinesis with LVEDP 18 mmHg. 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  Rule out myocarditis.  Rule out [place].  Cardiac MRI can be helpful and further identifying etiology.     Time coordinating discharge: Over 30  minutes    Dwyane Dee, MD  [facility] 01/05/2022, 2:45 PM

## 2022-01-06 DIAGNOSIS — G4733 Obstructive sleep apnea (adult) (pediatric): Secondary | ICD-10-CM | POA: Diagnosis not present

## 2022-01-08 ENCOUNTER — Telehealth (HOSPITAL_COMMUNITY): Payer: Self-pay | Admitting: *Deleted

## 2022-01-08 NOTE — Telephone Encounter (Signed)
Attempted to call patient regarding upcoming cardiac MRI appointment. Left message on voicemail with name and callback number  Jamerion Cabello RN Navigator Cardiac Imaging [facility] [phone] Office [phone] Cell  

## 2022-01-08 NOTE — Telephone Encounter (Signed)
Patient returning call regarding upcoming cardiac imaging study; pt verbalizes understanding of appt date/time, parking situation and where to check in, and verified current allergies; name and call back number provided for further questions should they arise  Gordy Clement RN [facility] [phone] office [phone] cell  Patient denies metal but does report some claustrophobia. States she had done ok with MRIs in the past.

## 2022-01-09 ENCOUNTER — Other Ambulatory Visit: Payer: Self-pay | Admitting: Cardiology

## 2022-01-09 ENCOUNTER — Ambulatory Visit (HOSPITAL_COMMUNITY)
Admission: RE | Admit: 2022-01-09 | Discharge: 2022-01-09 | Disposition: A | Discharge status: Home / Self Care | Payer: Medicare PPO | Source: Ambulatory Visit | Attending: Cardiology | Admitting: Cardiology

## 2022-01-09 DIAGNOSIS — I200 Unstable angina: Secondary | ICD-10-CM | POA: Insufficient documentation

## 2022-01-09 LAB — CULTURE, BLOOD (ROUTINE X 2)
Culture: NO GROWTH
Culture: NO GROWTH
Special Requests: ADEQUATE

## 2022-01-09 MED ORDER — GADOBUTROL 1 MMOL/ML IV SOLN
8.0000 mL | Freq: Once | INTRAVENOUS | Status: AC | PRN
  Administered 2022-01-09: 8 mL via INTRAVENOUS

## 2022-01-16 DIAGNOSIS — Z7182 Exercise counseling: Secondary | ICD-10-CM | POA: Diagnosis not present

## 2022-01-16 DIAGNOSIS — Z713 Dietary counseling and surveillance: Secondary | ICD-10-CM | POA: Diagnosis not present

## 2022-01-16 DIAGNOSIS — Z6834 Body mass index (BMI) 34.0-34.9, adult: Secondary | ICD-10-CM | POA: Diagnosis not present

## 2022-01-17 DIAGNOSIS — N202 Calculus of kidney with calculus of ureter: Secondary | ICD-10-CM | POA: Diagnosis not present

## 2022-01-29 DIAGNOSIS — N200 Calculus of kidney: Secondary | ICD-10-CM | POA: Diagnosis not present

## 2022-01-30 DIAGNOSIS — Z6834 Body mass index (BMI) 34.0-34.9, adult: Secondary | ICD-10-CM | POA: Diagnosis not present

## 2022-01-30 DIAGNOSIS — N200 Calculus of kidney: Secondary | ICD-10-CM | POA: Diagnosis not present

## 2022-01-30 DIAGNOSIS — E785 Hyperlipidemia, unspecified: Secondary | ICD-10-CM | POA: Diagnosis not present

## 2022-01-30 DIAGNOSIS — E1169 Type 2 diabetes mellitus with other specified complication: Secondary | ICD-10-CM | POA: Diagnosis not present

## 2022-01-31 DIAGNOSIS — N200 Calculus of kidney: Secondary | ICD-10-CM | POA: Diagnosis not present

## 2022-01-31 DIAGNOSIS — I252 Old myocardial infarction: Secondary | ICD-10-CM | POA: Diagnosis not present

## 2022-01-31 DIAGNOSIS — Z6834 Body mass index (BMI) 34.0-34.9, adult: Secondary | ICD-10-CM | POA: Diagnosis not present

## 2022-01-31 DIAGNOSIS — I5181 Takotsubo syndrome: Secondary | ICD-10-CM | POA: Diagnosis not present

## 2022-01-31 DIAGNOSIS — Z23 Encounter for immunization: Secondary | ICD-10-CM | POA: Diagnosis not present

## 2022-01-31 DIAGNOSIS — Z1231 Encounter for screening mammogram for malignant neoplasm of breast: Secondary | ICD-10-CM | POA: Diagnosis not present

## 2022-01-31 DIAGNOSIS — G4733 Obstructive sleep apnea (adult) (pediatric): Secondary | ICD-10-CM | POA: Diagnosis not present

## 2022-02-06 DIAGNOSIS — Z0389 Encounter for observation for other suspected diseases and conditions ruled out: Secondary | ICD-10-CM | POA: Diagnosis not present

## 2022-02-06 DIAGNOSIS — E2839 Other primary ovarian failure: Secondary | ICD-10-CM | POA: Diagnosis not present

## 2022-02-06 DIAGNOSIS — E1169 Type 2 diabetes mellitus with other specified complication: Secondary | ICD-10-CM | POA: Diagnosis not present

## 2022-02-12 DIAGNOSIS — Z6834 Body mass index (BMI) 34.0-34.9, adult: Secondary | ICD-10-CM | POA: Diagnosis not present

## 2022-02-12 DIAGNOSIS — Z713 Dietary counseling and surveillance: Secondary | ICD-10-CM | POA: Diagnosis not present

## 2022-02-12 DIAGNOSIS — E1169 Type 2 diabetes mellitus with other specified complication: Secondary | ICD-10-CM | POA: Diagnosis not present

## 2022-02-12 DIAGNOSIS — Z7182 Exercise counseling: Secondary | ICD-10-CM | POA: Diagnosis not present

## 2022-02-20 DIAGNOSIS — I129 Hypertensive chronic kidney disease with stage 1 through stage 4 chronic kidney disease, or unspecified chronic kidney disease: Secondary | ICD-10-CM | POA: Diagnosis not present

## 2022-02-20 DIAGNOSIS — E1169 Type 2 diabetes mellitus with other specified complication: Secondary | ICD-10-CM | POA: Diagnosis not present

## 2022-02-25 DIAGNOSIS — I129 Hypertensive chronic kidney disease with stage 1 through stage 4 chronic kidney disease, or unspecified chronic kidney disease: Secondary | ICD-10-CM | POA: Diagnosis not present

## 2022-02-25 DIAGNOSIS — Z6835 Body mass index (BMI) 35.0-35.9, adult: Secondary | ICD-10-CM | POA: Diagnosis not present

## 2022-02-25 DIAGNOSIS — E785 Hyperlipidemia, unspecified: Secondary | ICD-10-CM | POA: Diagnosis not present

## 2022-02-25 DIAGNOSIS — N182 Chronic kidney disease, stage 2 (mild): Secondary | ICD-10-CM | POA: Diagnosis not present

## 2022-02-25 DIAGNOSIS — E1169 Type 2 diabetes mellitus with other specified complication: Secondary | ICD-10-CM | POA: Diagnosis not present

## 2022-03-06 DIAGNOSIS — Z6835 Body mass index (BMI) 35.0-35.9, adult: Secondary | ICD-10-CM | POA: Diagnosis not present

## 2022-03-06 DIAGNOSIS — R059 Cough, unspecified: Secondary | ICD-10-CM | POA: Diagnosis not present

## 2022-03-06 DIAGNOSIS — Z20822 Contact with and (suspected) exposure to covid-19: Secondary | ICD-10-CM | POA: Diagnosis not present

## 2022-03-06 DIAGNOSIS — J069 Acute upper respiratory infection, unspecified: Secondary | ICD-10-CM | POA: Diagnosis not present

## 2022-03-08 DIAGNOSIS — E1169 Type 2 diabetes mellitus with other specified complication: Secondary | ICD-10-CM | POA: Diagnosis not present

## 2022-03-11 DIAGNOSIS — N202 Calculus of kidney with calculus of ureter: Secondary | ICD-10-CM | POA: Diagnosis not present

## 2022-03-11 DIAGNOSIS — R34 Anuria and oliguria: Secondary | ICD-10-CM | POA: Diagnosis not present

## 2022-03-20 DIAGNOSIS — Z6835 Body mass index (BMI) 35.0-35.9, adult: Secondary | ICD-10-CM | POA: Diagnosis not present

## 2022-03-20 DIAGNOSIS — E1169 Type 2 diabetes mellitus with other specified complication: Secondary | ICD-10-CM | POA: Diagnosis not present

## 2022-03-20 DIAGNOSIS — Z713 Dietary counseling and surveillance: Secondary | ICD-10-CM | POA: Diagnosis not present

## 2022-03-20 DIAGNOSIS — Z1331 Encounter for screening for depression: Secondary | ICD-10-CM | POA: Diagnosis not present

## 2022-03-21 DIAGNOSIS — Z20822 Contact with and (suspected) exposure to covid-19: Secondary | ICD-10-CM | POA: Diagnosis not present

## 2022-03-21 DIAGNOSIS — J029 Acute pharyngitis, unspecified: Secondary | ICD-10-CM | POA: Diagnosis not present

## 2022-03-21 DIAGNOSIS — J019 Acute sinusitis, unspecified: Secondary | ICD-10-CM | POA: Diagnosis not present

## 2022-03-27 ENCOUNTER — Encounter: Payer: Self-pay | Admitting: Nurse Practitioner

## 2022-03-27 ENCOUNTER — Ambulatory Visit: Payer: Medicare PPO | Attending: Nurse Practitioner | Admitting: Nurse Practitioner

## 2022-03-27 VITALS — BP 120/70 | HR 76 | Ht 62.0 in | Wt 192.4 lb

## 2022-03-27 DIAGNOSIS — I214 Non-ST elevation (NSTEMI) myocardial infarction: Secondary | ICD-10-CM

## 2022-03-27 DIAGNOSIS — I10 Essential (primary) hypertension: Secondary | ICD-10-CM

## 2022-03-27 DIAGNOSIS — Z6835 Body mass index (BMI) 35.0-35.9, adult: Secondary | ICD-10-CM | POA: Diagnosis not present

## 2022-03-27 DIAGNOSIS — E782 Mixed hyperlipidemia: Secondary | ICD-10-CM

## 2022-03-27 DIAGNOSIS — R9431 Abnormal electrocardiogram [ECG] [EKG]: Secondary | ICD-10-CM | POA: Diagnosis not present

## 2022-03-27 DIAGNOSIS — E119 Type 2 diabetes mellitus without complications: Secondary | ICD-10-CM | POA: Diagnosis not present

## 2022-03-27 DIAGNOSIS — E785 Hyperlipidemia, unspecified: Secondary | ICD-10-CM | POA: Diagnosis not present

## 2022-03-27 DIAGNOSIS — G4733 Obstructive sleep apnea (adult) (pediatric): Secondary | ICD-10-CM | POA: Diagnosis not present

## 2022-03-27 DIAGNOSIS — I5181 Takotsubo syndrome: Secondary | ICD-10-CM | POA: Diagnosis not present

## 2022-03-27 DIAGNOSIS — Z713 Dietary counseling and surveillance: Secondary | ICD-10-CM | POA: Diagnosis not present

## 2022-03-27 DIAGNOSIS — E1169 Type 2 diabetes mellitus with other specified complication: Secondary | ICD-10-CM | POA: Diagnosis not present

## 2022-03-27 NOTE — Patient Instructions (Addendum)
Medication Instructions:  Your physician recommends that you continue on your current medications as directed. Please refer to the Current Medication list given to you today.  *If you need a refill on your cardiac medications before your next appointment, please call your pharmacy*   Lab Work: NONE ordered at this time of appointment   If you have labs (blood work) drawn today and your tests are completely normal, you will receive your results only by: MyChart Message (if you have MyChart) OR A paper copy in the mail If you have any lab test that is abnormal or we need to change your treatment, we will call you to review the results.   Testing/Procedures: NONE ordered at this time of appointment     Follow-Up: At [facility], you and your health needs are our priority.  As part of our continuing mission to provide you with exceptional heart care, we have created designated Provider Care Teams.  These Care Teams include your primary Cardiologist (physician) and Advanced Practice Providers (APPs -  Physician Assistants and Nurse Practitioners) who all work together to provide you with the care you need, when you need it.  We recommend signing up for the patient portal called "MyChart".  Sign up information is provided on this After Visit Summary.  MyChart is used to connect with patients for Virtual Visits (Telemedicine).  Patients are able to view lab/test results, encounter notes, upcoming appointments, etc.  Non-urgent messages can be sent to your provider as well.   To learn more about what you can do with MyChart, go to [email or web address].    Your next appointment:   5-6 month(s)  The format for your next appointment:   In Person  Provider:   Thomasene Ripple, DO     Other Instructions   Important Information About Sugar

## 2022-03-27 NOTE — Progress Notes (Signed)
Office Visit    Patient Name: Jade Perez Date of Encounter: 03/27/2022  Primary Care Provider:  Abner Greenspan, MD Primary Cardiologist:  Thomasene Ripple, DO  Chief Complaint    66 year old female with a history of NSTEMI (normal coronaries, presumed stress cardiomyopathy), prolonged QT interval, hypertension, hyperlipidemia, type 2 diabetes, OSA, asthma, kidney stones and GERD who presents for follow-up related to NSTEMI.    Past Medical History    Past Medical History:  Diagnosis Date   Allergic rhinitis    Asthma, mild intermittent    followed by pcp    (prevously followed by dr Lucie Leather w/ asthma/ allergy center)   Complication of anesthesia    difficulty waking up   Depression    GAD (generalized anxiety disorder)    GERD (gastroesophageal reflux disease)    Hiatal hernia    History of cardiomyopathy in adulthood 12/06/2021   in setting elevated troponin's w/ NSTEMI;  per echo  12-07-2021  ef 50-55%   History of heart murmur in childhood    since birth very slight   History of kidney stones    History of non-ST elevation myocardial infarction (NSTEMI) 12/06/2021   admission in epic---  elevated troponin's w/ NSTEMI  and stress cardiomyopathy,  12-07-2021 per cath normal coronaries ;  cardiology hospital follow-up with Bernadene Person NP 12-26-2021   Hyperlipidemia, mixed    Hypertension    IDA (iron deficiency anemia)    OA (osteoarthritis)    thumb and fingers, knee, right hip   OSA (obstructive sleep apnea)    12-27-2021  per pt OSA yrs ago last used cpap approx. 2013,  stated retested this year and is waiting on cpap machine to arrive   Renal disorder    Type 2 diabetes mellitus (HCC) 2008   followed by pcp   (12-27-2021  pt sated checks blood sugar am daily fasting, average 155-170)   Wears glasses    Past Surgical History:  Procedure Laterality Date   BREAST EXCISIONAL BIOPSY Right    x2  last one 1980s , benign   CARPAL TUNNEL RELEASE Right 05/22/2017    Procedure: RIGHT CARPAL TUNNEL RELEASE;  Surgeon: Cindee Salt, MD;  Location: [facility];  Service: Orthopedics;  Laterality: Right;   CARPAL TUNNEL RELEASE Left 03/12/2018   Procedure: LEFT CARPAL TUNNEL RELEASE;  Surgeon: Cindee Salt, MD;  Location: [facility];  Service: Orthopedics;  Laterality: Left;   CYSTOSCOPY W/ URETERAL STENT PLACEMENT Bilateral 12/11/2021   Procedure: CYSTOSCOPY , BILATERAL RETROGRADE , BILATERAL STENT PLACEMENT;  Surgeon: Sebastian Ache, MD;  Location: [facility] OR;  Service: Urology;  Laterality: Bilateral;   CYSTOSCOPY WITH RETROGRADE PYELOGRAM, URETEROSCOPY AND STENT PLACEMENT Bilateral 01/02/2022   Procedure: CYSTOSCOPY WITH RETROGRADE PYELOGRAM, URETEROSCOPY AND STENT REPLACEMENT;  Surgeon: Sebastian Ache, MD;  Location: [facility];  Service: Urology;  Laterality: Bilateral;   DIAGNOSTIC LAPAROSCOPY     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;  Service: Urology;  Laterality: Bilateral;   LAPAROSCOPIC APPENDECTOMY  2008   LEFT HEART CATH AND CORONARY ANGIOGRAPHY N/A 12/07/2021   Procedure: LEFT HEART CATH AND CORONARY ANGIOGRAPHY;  Surgeon: Lyn Records, MD;  Location: [facility];  Service: Cardiovascular;  Laterality: N/A;   LIPOMA EXCISION  2003   right shoulder   SCLERAL BUCKLE  10/15/2011   Procedure: SCLERAL BUCKLE;  Surgeon: Sherrie George, MD;  Location: [facility] OR;  Service: Ophthalmology;  Laterality: Right;   SHOULDER ARTHROSCOPY WITH ROTATOR CUFF REPAIR Right 03/25/2019   Procedure: Right shoulder arthroscopy, subacromial decompression, distal clavicle resection, rotator cuff repair;  Surgeon: Francena Hanly, MD;  Location: [facility];  Service: Orthopedics;   Laterality: Right;    WISDOM TOOTH EXTRACTION      Allergies  Allergies  Allergen Reactions   Amoxicillin Hives    Did it involve swelling of the face/tongue/throat, SOB, or low BP? No Did it involve sudden or severe rash/hives, skin peeling, or any reaction on the inside of your mouth or nose? No Did you need to seek medical attention at a hospital or doctor's office? No When did it last happen?      15-20 years ago If all above answers are "NO", may proceed with cephalosporin use.    Aspirin Swelling   Bee Venom Other (See Comments)    Causes cellulitis   Clarithromycin Other (See Comments)    Gi upset   Erythromycin Nausea Only and Other (See Comments)    Stomach pain, also   Ibuprofen Swelling   Oxycodone Itching   Sulfa Antibiotics Hives   Latex Rash   Tape Rash and Other (See Comments)    Band-Aids, also = blisters    History of Present Illness    66 year old female with the above past medical history including NSTEMI (normal coronaries, presumed stress cardiomyopathy), prolonged QT interval, hypertension, hyperlipidemia, type 2 diabetes, OSA, asthma, kidney stones and GERD.   She presented to the ED on 12/10/2021 with flank pain, nausea and vomiting and was found to have an elevated troponin.  Additionally, she was noted to have prolonged QT interval on EKG.  Celexa was decreased to 20 mg daily.  Cardiology was consulted.  Cardiac catheterization revealed normal coronary arteries. Echocardiogram revealed EF 50 to 55%, low normal LV function, hypokinesis of the anterolateral, inferior, and posterior walls, G1 DD.  Findings were consistent with stress cardiomyopathy.  She was noted to have numerous bilateral renal calculi.  Urology was consulted and she underwent cystoscopy with bilateral ureteral stent placement on 12/11/2021.  She was last seen in the office on 12/26/2021 and was stable from a cardiac standpoint. She was cleared for urethral stent removal.  Patient cardiac  MRI from hospital follow-up was unremarkable, EF 54%, no myocardial LGE, no definitive evidence of prior MI, infiltrative disease, or myocarditis.    She presents today for follow-up accompanied by her husband. Since her last visit he has done well from a cardiac standpoint.  She denies any chest pain.  She does note that she feels a little winded after certain activities but she attributes this to overall physical deconditioning.  She is recovering from an upper respiratory infection.  She is working on gradually increasing her activity as tolerated. Her new CPAP machine is arriving tomorrow.  Overall, she reports feeling well.  Home Medications    Current Outpatient Medications  Medication Sig Dispense Refill   acetaminophen (TYLENOL) 500 MG tablet Take 500 mg by mouth every 6 (six) hours as needed for mild pain.     albuterol (PROAIR HFA) 108 (90 Base) MCG/ACT inhaler Can inhale two puffs every four to six hours as needed for cough or wheeze. (Patient taking differently: Inhale 1 puff into the lungs as  needed for shortness of breath.) 8 g 1   amLODipine (NORVASC) 5 MG tablet Take 1 tablet (5 mg total) by mouth daily. (Patient taking differently: Take 5 mg by mouth daily.) 30 tablet 2   atorvastatin (LIPITOR) 40 MG tablet Take 40 mg by mouth at bedtime.     b complex vitamins tablet Take 1 tablet by mouth at bedtime.     cetirizine (ZYRTEC) 10 MG tablet Take 10 mg by mouth daily.     citalopram (CELEXA) 40 MG tablet Take 20 mg by mouth daily.     fenofibrate (TRICOR) 145 MG tablet Take 145 mg by mouth at bedtime.     Ferrous Sulfate (SLOW FE PO) Take 1 tablet by mouth daily.     glimepiride (AMARYL) 1 MG tablet Take 0.5 mg by mouth 2 (two) times daily.     montelukast (SINGULAIR) 10 MG tablet TAKE 1 TABLET BY MOUTH EVERY DAY (Patient taking differently: Take 10 mg by mouth at bedtime.) 90 tablet 3   omeprazole (PRILOSEC) 40 MG capsule TAKE 1 CAPSULE BY MOUTH EVERY DAY IN THE MORNING BEFORE  BREAKFAST (Patient taking differently: Take 40 mg by mouth daily.) 30 capsule 0   OZEMPIC, 1 MG/DOSE, 4 MG/3ML SOPN Inject 2 mg into the skin once a week. Wednesday's     triamcinolone (NASACORT) 55 MCG/ACT AERO nasal inhaler Place 1 spray into the nose daily as needed (allergies).     Vitamin D, Ergocalciferol, (DRISDOL) 50000 UNITS CAPS capsule Take 50,000 Units by mouth every 30 (thirty) days.      No current facility-administered medications for this visit.     Review of Systems   She denies chest pain, palpitations, dyspnea, pnd, orthopnea, n, v, dizziness, syncope, edema, weight gain, or early satiety. All other systems reviewed and are otherwise negative except as noted above.   Physical Exam    VS:  BP 120/70   Pulse 76   Ht 5\' 2"  (1.575 m)   Wt 192 lb 6.4 oz (87.3 kg)   SpO2 97%   BMI 35.19 kg/m  GEN: Well nourished, well developed, in no acute distress. HEENT: normal. Neck: Supple, no JVD, carotid bruits, or masses. Cardiac: RRR, no murmurs, rubs, or gallops. No clubbing, cyanosis, edema.  Radials/DP/PT 2+ and equal bilaterally.  Respiratory:  Respirations regular and unlabored, clear to auscultation bilaterally. GI: Soft, nontender, nondistended, BS + x 4. MS: no deformity or atrophy. Skin: warm and dry, no rash. Neuro:  Strength and sensation are intact. Psych: Normal affect.  Accessory Clinical Findings    ECG personally reviewed by me today - No EKG in office today.   Lab Results  Component Value Date   WBC 4.4 01/05/2022   HGB 10.3 (L) 01/05/2022   HCT 31.5 (L) 01/05/2022   MCV 94.9 01/05/2022   PLT 271 01/05/2022   Lab Results  Component Value Date   CREATININE 0.99 01/05/2022   BUN 7 (L) 01/05/2022   NA 138 01/05/2022   K 3.9 01/05/2022   CL 109 01/05/2022   CO2 24 01/05/2022   Lab Results  Component Value Date   ALT 22 01/03/2022   AST 21 01/03/2022   ALKPHOS 39 01/03/2022   BILITOT 0.7 01/03/2022   Lab Results  Component Value Date    CHOL 125 12/07/2021   HDL 38 (L) 12/07/2021   LDLCALC 59 12/07/2021   TRIG 138 12/07/2021   CHOLHDL 3.3 12/07/2021    Lab Results  Component Value Date  HGBA1C 6.5 (H) 01/04/2022    Assessment & Plan    1. Elevated troponin (NSTEMI)/stress cardiomyopathy: Cath in 12/2021 revealed normal coronary arteries.  Echocardiogram revealed EF 50 to 55%, low normal LV function, hypokinesis of the anterolateral, inferior, and posterior walls, G1 DD.  Findings were consistent with stress cardiomyopathy. Cardiac MRI was unrevealing. She occasionally feels "winded" after strenuous activities, she denies any chest pain or other symptoms concerning for angina. Euvolemic and well compensated on exam.  Encouraged increased activity as tolerated.    2. Prolonged QT interval: Noted on prior EKGs.  No EKG in office today, stable at last visit.  Continue to monitor.   3. Hypertension: BP well controlled. Continue current antihypertensive regimen.    4. Hyperlipidemia: LDL was 59 in 12/2021.  Monitored and managed per PCP.  Continue Lipitor.   5. Type 2 diabetes: A1C was 6.9 in 11/2021.  Monitored and managed per PCP.   6. OSA: She is awaiting the arrival of her new CPAP machine.  Encouraged full adherence once machine arrives.  7. Disposition: Follow-up in 5 to 6 months with Dr. Servando Salinaobb.      Joylene GrapesEmily C Koltyn Kelsay, NP 03/27/2022, 12:35 PM

## 2022-04-08 DIAGNOSIS — E1169 Type 2 diabetes mellitus with other specified complication: Secondary | ICD-10-CM | POA: Diagnosis not present

## 2022-04-18 DIAGNOSIS — H25813 Combined forms of age-related cataract, bilateral: Secondary | ICD-10-CM | POA: Diagnosis not present

## 2022-04-18 DIAGNOSIS — E119 Type 2 diabetes mellitus without complications: Secondary | ICD-10-CM | POA: Diagnosis not present

## 2022-04-18 DIAGNOSIS — H40013 Open angle with borderline findings, low risk, bilateral: Secondary | ICD-10-CM | POA: Diagnosis not present

## 2022-04-18 DIAGNOSIS — H53021 Refractive amblyopia, right eye: Secondary | ICD-10-CM | POA: Diagnosis not present

## 2022-04-18 DIAGNOSIS — H43813 Vitreous degeneration, bilateral: Secondary | ICD-10-CM | POA: Diagnosis not present

## 2022-04-28 DIAGNOSIS — G4733 Obstructive sleep apnea (adult) (pediatric): Secondary | ICD-10-CM | POA: Diagnosis not present

## 2022-04-29 DIAGNOSIS — G4733 Obstructive sleep apnea (adult) (pediatric): Secondary | ICD-10-CM | POA: Diagnosis not present

## 2022-05-07 DIAGNOSIS — M79671 Pain in right foot: Secondary | ICD-10-CM | POA: Diagnosis not present

## 2022-05-09 DIAGNOSIS — E1169 Type 2 diabetes mellitus with other specified complication: Secondary | ICD-10-CM | POA: Diagnosis not present

## 2022-05-10 DIAGNOSIS — M79671 Pain in right foot: Secondary | ICD-10-CM | POA: Diagnosis not present

## 2022-05-14 DIAGNOSIS — M79671 Pain in right foot: Secondary | ICD-10-CM | POA: Diagnosis not present

## 2022-05-16 DIAGNOSIS — E785 Hyperlipidemia, unspecified: Secondary | ICD-10-CM | POA: Diagnosis not present

## 2022-05-16 DIAGNOSIS — I129 Hypertensive chronic kidney disease with stage 1 through stage 4 chronic kidney disease, or unspecified chronic kidney disease: Secondary | ICD-10-CM | POA: Diagnosis not present

## 2022-05-16 DIAGNOSIS — M1990 Unspecified osteoarthritis, unspecified site: Secondary | ICD-10-CM | POA: Diagnosis not present

## 2022-05-16 DIAGNOSIS — E559 Vitamin D deficiency, unspecified: Secondary | ICD-10-CM | POA: Diagnosis not present

## 2022-05-16 DIAGNOSIS — G4733 Obstructive sleep apnea (adult) (pediatric): Secondary | ICD-10-CM | POA: Diagnosis not present

## 2022-05-16 DIAGNOSIS — K219 Gastro-esophageal reflux disease without esophagitis: Secondary | ICD-10-CM | POA: Diagnosis not present

## 2022-05-16 DIAGNOSIS — F411 Generalized anxiety disorder: Secondary | ICD-10-CM | POA: Diagnosis not present

## 2022-05-16 DIAGNOSIS — J45909 Unspecified asthma, uncomplicated: Secondary | ICD-10-CM | POA: Diagnosis not present

## 2022-05-16 DIAGNOSIS — R32 Unspecified urinary incontinence: Secondary | ICD-10-CM | POA: Diagnosis not present

## 2022-05-21 DIAGNOSIS — M25512 Pain in left shoulder: Secondary | ICD-10-CM | POA: Diagnosis not present

## 2022-05-28 DIAGNOSIS — M79671 Pain in right foot: Secondary | ICD-10-CM | POA: Diagnosis not present

## 2022-05-29 DIAGNOSIS — G4733 Obstructive sleep apnea (adult) (pediatric): Secondary | ICD-10-CM | POA: Diagnosis not present

## 2022-06-04 DIAGNOSIS — M25512 Pain in left shoulder: Secondary | ICD-10-CM | POA: Diagnosis not present

## 2022-06-04 DIAGNOSIS — I129 Hypertensive chronic kidney disease with stage 1 through stage 4 chronic kidney disease, or unspecified chronic kidney disease: Secondary | ICD-10-CM | POA: Diagnosis not present

## 2022-06-04 DIAGNOSIS — E1169 Type 2 diabetes mellitus with other specified complication: Secondary | ICD-10-CM | POA: Diagnosis not present

## 2022-06-07 DIAGNOSIS — E1169 Type 2 diabetes mellitus with other specified complication: Secondary | ICD-10-CM | POA: Diagnosis not present

## 2022-06-10 DIAGNOSIS — E1169 Type 2 diabetes mellitus with other specified complication: Secondary | ICD-10-CM | POA: Diagnosis not present

## 2022-06-10 DIAGNOSIS — Z6836 Body mass index (BMI) 36.0-36.9, adult: Secondary | ICD-10-CM | POA: Diagnosis not present

## 2022-06-10 DIAGNOSIS — N182 Chronic kidney disease, stage 2 (mild): Secondary | ICD-10-CM | POA: Diagnosis not present

## 2022-06-10 DIAGNOSIS — E785 Hyperlipidemia, unspecified: Secondary | ICD-10-CM | POA: Diagnosis not present

## 2022-06-10 DIAGNOSIS — Z139 Encounter for screening, unspecified: Secondary | ICD-10-CM | POA: Diagnosis not present

## 2022-06-10 DIAGNOSIS — I129 Hypertensive chronic kidney disease with stage 1 through stage 4 chronic kidney disease, or unspecified chronic kidney disease: Secondary | ICD-10-CM | POA: Diagnosis not present

## 2022-06-17 DIAGNOSIS — G4733 Obstructive sleep apnea (adult) (pediatric): Secondary | ICD-10-CM | POA: Diagnosis not present

## 2022-06-17 DIAGNOSIS — Z6836 Body mass index (BMI) 36.0-36.9, adult: Secondary | ICD-10-CM | POA: Diagnosis not present

## 2022-06-19 DIAGNOSIS — M75102 Unspecified rotator cuff tear or rupture of left shoulder, not specified as traumatic: Secondary | ICD-10-CM | POA: Diagnosis not present

## 2022-06-20 ENCOUNTER — Telehealth: Payer: Self-pay | Admitting: *Deleted

## 2022-06-20 NOTE — Telephone Encounter (Signed)
Pt has been scheduled for tele pre op appt 06/26/22 @ 9:20. Med rec and consent are done.

## 2022-06-20 NOTE — Telephone Encounter (Signed)
Left message for the pt to call back to schedule a tele pre op appt 

## 2022-06-20 NOTE — Telephone Encounter (Signed)
Pt has been scheduled for tele pre op appt 06/26/22 @ 9:20. Med rec and consent are done.     Patient Consent for Virtual Visit        Jade Perez has provided verbal consent on 06/20/2022 for a virtual visit (video or telephone).   CONSENT FOR VIRTUAL VISIT FOR:  Jade Perez  By participating in this virtual visit I agree to the following:  I hereby voluntarily request, consent and authorize [facility] and its employed or contracted physicians, physician assistants, nurse practitioners or other licensed health care professionals (the Practitioner), to provide me with telemedicine health care services (the "Services") as deemed necessary by the treating Practitioner. I acknowledge and consent to receive the Services by the Practitioner via telemedicine. I understand that the telemedicine visit will involve communicating with the Practitioner through live audiovisual communication technology and the disclosure of certain medical information by electronic transmission. I acknowledge that I have been given the opportunity to request an in-person assessment or other available alternative prior to the telemedicine visit and am voluntarily participating in the telemedicine visit.  I understand that I have the right to withhold or withdraw my consent to the use of telemedicine in the course of my care at any time, without affecting my right to future care or treatment, and that the Practitioner or I may terminate the telemedicine visit at any time. I understand that I have the right to inspect all information obtained and/or recorded in the course of the telemedicine visit and may receive copies of available information for a reasonable fee.  I understand that some of the potential risks of receiving the Services via telemedicine include:  Delay or interruption in medical evaluation due to technological equipment failure or disruption; Information transmitted may not be sufficient  (e.g. poor resolution of images) to allow for appropriate medical decision making by the Practitioner; and/or  In rare instances, security protocols could fail, causing a breach of personal health information.  Furthermore, I acknowledge that it is my responsibility to provide information about my medical history, conditions and care that is complete and accurate to the best of my ability. I acknowledge that Practitioner's advice, recommendations, and/or decision may be based on factors not within their control, such as incomplete or inaccurate data provided by me or distortions of diagnostic images or specimens that may result from electronic transmissions. I understand that the practice of medicine is not an exact science and that Practitioner makes no warranties or guarantees regarding treatment outcomes. I acknowledge that a copy of this consent can be made available to me via my patient portal ([facility]), or I can request a printed copy by calling the office of [facility].    I understand that my insurance will be billed for this visit.   I have read or had this consent read to me. I understand the contents of this consent, which adequately explains the benefits and risks of the Services being provided via telemedicine.  I have been provided ample opportunity to ask questions regarding this consent and the Services and have had my questions answered to my satisfaction. I give my informed consent for the services to be provided through the use of telemedicine in my medical care

## 2022-06-20 NOTE — Telephone Encounter (Signed)
   Pre-operative Risk Assessment    Patient Name: Jade Perez  DOB: 1955/06/22 MRN: [id number]      Request for Surgical Clearance    Procedure:   LEFT SHOULDER SCOPE W/RCR  Date of Surgery:  Clearance TBD                                 Surgeon:  DR. Lennette Bihari SUPPLE Surgeon's Group or Practice Name:  [facility] Phone number:  [phone] ATTN: Jade Perez Fax number:  [phone]   Type of Clearance Requested:   - Medical ;NO MEDICATIONS ARE LISTED AS NEEDING TO BE HELD   Type of Anesthesia:  General    Additional requests/questions:    Jade Perez   06/20/2022, 11:30 AM

## 2022-06-20 NOTE — Telephone Encounter (Signed)
   Name: Jade Perez  DOB: [birth year]  MRN: [id number]  Primary Cardiologist: Berniece Salines, DO   Preoperative team, please contact this patient and set up a phone call appointment for further preoperative risk assessment. Please obtain consent and complete medication review. Thank you for your help.  I confirm that guidance regarding antiplatelet and oral anticoagulation therapy has been completed and, if necessary, noted below (none requested).    Lenna Sciara, NP 06/20/2022, 1:09 PM [facility]

## 2022-06-25 NOTE — Progress Notes (Unsigned)
Virtual Visit via Telephone Note   Because of Wallie Rockey co-morbid illnesses, she is at least at moderate risk for complications without adequate follow up.  This format is felt to be most appropriate for this patient at this time.  The patient did not have access to video technology/had technical difficulties with video requiring transitioning to audio format only (telephone).  All issues noted in this document were discussed and addressed.  No physical exam could be performed with this format.  Please refer to the patient's chart for her consent to telehealth for [facility].  Evaluation Performed:  Preoperative cardiovascular risk assessment _____________   Date:  06/25/2022   Patient ID:  Jade, Perez 1956-03-14, MRN [id number] Patient Location:  Home Provider location:   Office  Primary Care Provider:  Marco Collie, MD Primary Cardiologist:  Berniece Salines, DO  Chief Complaint / Patient Profile   67 y.o. y/o female with a h/o NSTEMI without CAD thought to be Takotsubo cardiomyopathy, prolonged QT, hypertension, hyperlipidemia, T2DM, OSA, asthma, kidney stones, and GERD who is pending left shoulder scope w/ RCR by Dr. Onnie Graham and presents today for telephonic preoperative cardiovascular risk assessment.  History of Present Illness    Jade Perez is a 67 y.o. female who presents via audio/video conferencing for a telehealth visit today.  Pt was last seen in cardiology clinic on 03/27/2022 by Diona Browner, NP.  At that time Jade Perez was doing well.  The patient is now pending procedure as outlined above. Since her last visit, she is doing well from a cardiac standpoint. Patient denies shortness of breath or dyspnea on exertion. No chest pain, pressure, or tightness. Denies lower extremity edema, orthopnea, or PND. Occasional "skipped beat for years" that is unchanged. She is compliant with use of CPAP. Her activity has been limited secondary to a  right foot injury. She is slowly progressing her walking and other exercises without difficulty.   Past Medical History    Past Medical History:  Diagnosis Date   Allergic rhinitis    Asthma, mild intermittent    followed by pcp    (prevously followed by dr Neldon Mc w/ asthma/ allergy center)   Complication of anesthesia    difficulty waking up   Depression    GAD (generalized anxiety disorder)    GERD (gastroesophageal reflux disease)    Hiatal hernia    History of cardiomyopathy in adulthood 12/06/2021   in setting elevated troponin's w/ NSTEMI;  per echo  12-07-2021  ef 50-55%   History of heart murmur in childhood    since birth very slight   History of kidney stones    History of non-ST elevation myocardial infarction (NSTEMI) 12/06/2021   admission in epic---  elevated troponin's w/ NSTEMI  and stress cardiomyopathy,  12-07-2021 per cath normal coronaries ;  cardiology hospital follow-up with Diona Browner NP 12-26-2021   Hyperlipidemia, mixed    Hypertension    IDA (iron deficiency anemia)    OA (osteoarthritis)    thumb and fingers, knee, right hip   OSA (obstructive sleep apnea)    12-27-2021  per pt OSA yrs ago last used cpap approx. 2013,  stated retested this year and is waiting on cpap machine to arrive   Renal disorder    Type 2 diabetes mellitus ([place]) 2008   followed by pcp   (12-27-2021  pt sated checks blood sugar am daily fasting, average 155-170)   Wears glasses  Past Surgical History:  Procedure Laterality Date   BREAST EXCISIONAL BIOPSY Right    x2  last one 1980s , benign   CARPAL TUNNEL RELEASE Right 05/22/2017   Procedure: RIGHT CARPAL TUNNEL RELEASE;  Surgeon: Daryll Brod, MD;  Location: [facility];  Service: Orthopedics;  Laterality: Right;   CARPAL TUNNEL RELEASE Left 03/12/2018   Procedure: LEFT CARPAL TUNNEL RELEASE;  Surgeon: Daryll Brod, MD;  Location: [facility];  Service: Orthopedics;  Laterality: Left;    CYSTOSCOPY W/ URETERAL STENT PLACEMENT Bilateral 12/11/2021   Procedure: CYSTOSCOPY , BILATERAL RETROGRADE , BILATERAL STENT PLACEMENT;  Surgeon: Alexis Frock, MD;  Location: [facility];  Service: Urology;  Laterality: Bilateral;   CYSTOSCOPY WITH RETROGRADE PYELOGRAM, URETEROSCOPY AND STENT PLACEMENT Bilateral 01/02/2022   Procedure: CYSTOSCOPY WITH RETROGRADE PYELOGRAM, URETEROSCOPY AND STENT REPLACEMENT;  Surgeon: Alexis Frock, MD;  Location: [facility];  Service: Urology;  Laterality: Bilateral;   DIAGNOSTIC LAPAROSCOPY     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   DILATION AND CURETTAGE OF UTERUS     yrs ago for missed ab   EXTRACORPOREAL SHOCK WAVE LITHOTRIPSY  03/2017   HOLMIUM LASER APPLICATION Bilateral 0000000   Procedure: HOLMIUM LASER APPLICATION;  Surgeon: Alexis Frock, MD;  Location: [facility];  Service: Urology;  Laterality: Bilateral;   LAPAROSCOPIC APPENDECTOMY  2008   LEFT HEART CATH AND CORONARY ANGIOGRAPHY N/A 12/07/2021   Procedure: LEFT HEART CATH AND CORONARY ANGIOGRAPHY;  Surgeon: Belva Crome, MD;  Location: [facility];  Service: Cardiovascular;  Laterality: N/A;   LIPOMA EXCISION  2003   right shoulder   SCLERAL BUCKLE  10/15/2011   Procedure: SCLERAL BUCKLE;  Surgeon: Hayden Pedro, MD;  Location: [facility];  Service: Ophthalmology;  Laterality: Right;   SHOULDER ARTHROSCOPY WITH ROTATOR CUFF REPAIR Right 03/25/2019   Procedure: Right shoulder arthroscopy, subacromial decompression, distal clavicle resection, rotator cuff repair;  Surgeon: Justice Britain, MD;  Location: [facility];  Service: Orthopedics;  Laterality: Right;  169min   WISDOM TOOTH EXTRACTION      Allergies  Allergies  Allergen Reactions   Amoxicillin Hives    Did it involve swelling of the face/tongue/throat, SOB, or low BP? No Did it involve sudden or severe rash/hives, skin peeling, or any reaction on the inside of your mouth or nose? No Did you need to seek medical  attention at a hospital or doctor's office? No When did it last happen?      15-20 years ago If all above answers are "NO", may proceed with cephalosporin use.    Aspirin Swelling   Bee Venom Other (See Comments)    Causes cellulitis   Clarithromycin Other (See Comments)    Gi upset   Erythromycin Nausea Only and Other (See Comments)    Stomach pain, also   Ibuprofen Swelling   Oxycodone Itching   Sulfa Antibiotics Hives   Latex Rash   Tape Rash and Other (See Comments)    Band-Aids, also = blisters    Home Medications    Prior to Admission medications   Medication Sig Start Date End Date Taking? Authorizing Provider  acetaminophen (TYLENOL) 500 MG tablet Take 500 mg by mouth every 6 (six) hours as needed for mild pain.    [provider]  albuterol (PROAIR HFA) 108 (90 Base) MCG/ACT inhaler Can inhale two puffs every four to six hours as needed for cough or wheeze. Patient taking differently: Inhale 1 puff into the lungs  as needed for shortness of breath. 06/08/20   Kozlow, Donnamarie Poag, MD  amLODipine (NORVASC) 5 MG tablet Take 1 tablet (5 mg total) by mouth daily. Patient taking differently: Take 5 mg by mouth daily. 12/16/21   Regalado, Belkys A, MD  atorvastatin (LIPITOR) 40 MG tablet Take 40 mg by mouth at bedtime.    [provider]  b complex vitamins tablet Take 1 tablet by mouth at bedtime.    [provider]  cetirizine (ZYRTEC) 10 MG tablet Take 10 mg by mouth daily.    [provider]  citalopram (CELEXA) 40 MG tablet Take 20 mg by mouth daily.    [provider]  fenofibrate (TRICOR) 145 MG tablet Take 145 mg by mouth at bedtime. 09/22/19   [provider]  Ferrous Sulfate (SLOW FE PO) Take 1 tablet by mouth daily.    [provider]  glimepiride (AMARYL) 1 MG tablet Take 0.5 mg by mouth 2 (two) times daily. 02/15/19   [provider]  montelukast (SINGULAIR) 10 MG tablet TAKE 1 TABLET BY MOUTH EVERY  DAY Patient taking differently: Take 10 mg by mouth at bedtime. 07/19/19   Kozlow, Donnamarie Poag, MD  omeprazole (PRILOSEC) 40 MG capsule TAKE 1 CAPSULE BY MOUTH EVERY DAY IN THE MORNING BEFORE BREAKFAST Patient taking differently: Take 40 mg by mouth daily. 07/30/21   Kozlow, Donnamarie Poag, MD  OZEMPIC, 1 MG/DOSE, 4 MG/3ML SOPN Inject 2 mg into the skin once a week. Wednesday's 05/25/20   [provider]  triamcinolone (NASACORT) 55 MCG/ACT AERO nasal inhaler Place 1 spray into the nose daily as needed (allergies).    [provider]  Vitamin D, Ergocalciferol, (DRISDOL) 50000 UNITS CAPS capsule Take 50,000 Units by mouth every 30 (thirty) days.     [provider]    Physical Exam    Vital Signs:  Jade Perez does not have vital signs available for review today.  Given telephonic nature of communication, physical exam is limited. AAOx3. NAD. Normal affect.  Speech and respirations are unlabored.  Accessory Clinical Findings    None  Assessment & Plan    Primary Cardiologist: Berniece Salines, DO  Preoperative cardiovascular risk assessment.  Left shoulder scope with RCR by Dr. Onnie Graham.  Chart reviewed as part of pre-operative protocol coverage. According to the RCRI, patient has a 0.9% risk of MACE. Patient reports activity equivalent to 5.07 METS (Per [facility]).   Given past medical history and time since last visit, based on ACC/AHA guidelines, Jade Perez would be at acceptable risk for the planned procedure without further cardiovascular testing.   Patient was advised that if she develops new symptoms prior to surgery to contact our office to arrange a follow-up appointment.  she verbalized understanding.   I will route this recommendation to the requesting party via Epic fax function.  Please call with questions.  Time:   Today, I have spent 7 minutes with the patient with telehealth technology discussing medical history, symptoms, and management plan.      Mayra Reel, NP  06/25/2022, 5:53 PM

## 2022-06-26 ENCOUNTER — Encounter: Payer: Self-pay | Admitting: Student

## 2022-06-26 ENCOUNTER — Ambulatory Visit: Payer: Medicare PPO | Attending: Cardiovascular Disease | Admitting: Student

## 2022-06-26 DIAGNOSIS — Z01810 Encounter for preprocedural cardiovascular examination: Secondary | ICD-10-CM

## 2022-06-27 DIAGNOSIS — E785 Hyperlipidemia, unspecified: Secondary | ICD-10-CM | POA: Diagnosis not present

## 2022-06-27 DIAGNOSIS — I129 Hypertensive chronic kidney disease with stage 1 through stage 4 chronic kidney disease, or unspecified chronic kidney disease: Secondary | ICD-10-CM | POA: Diagnosis not present

## 2022-06-27 DIAGNOSIS — N182 Chronic kidney disease, stage 2 (mild): Secondary | ICD-10-CM | POA: Diagnosis not present

## 2022-06-27 DIAGNOSIS — Z6836 Body mass index (BMI) 36.0-36.9, adult: Secondary | ICD-10-CM | POA: Diagnosis not present

## 2022-06-27 DIAGNOSIS — G4733 Obstructive sleep apnea (adult) (pediatric): Secondary | ICD-10-CM | POA: Diagnosis not present

## 2022-06-27 DIAGNOSIS — E1169 Type 2 diabetes mellitus with other specified complication: Secondary | ICD-10-CM | POA: Diagnosis not present

## 2022-06-28 DIAGNOSIS — Z01818 Encounter for other preprocedural examination: Secondary | ICD-10-CM | POA: Diagnosis not present

## 2022-07-08 DIAGNOSIS — E1169 Type 2 diabetes mellitus with other specified complication: Secondary | ICD-10-CM | POA: Diagnosis not present

## 2022-07-09 DIAGNOSIS — Z6836 Body mass index (BMI) 36.0-36.9, adult: Secondary | ICD-10-CM | POA: Diagnosis not present

## 2022-07-09 DIAGNOSIS — R197 Diarrhea, unspecified: Secondary | ICD-10-CM | POA: Diagnosis not present

## 2022-07-15 DIAGNOSIS — D197 Benign neoplasm of mesothelial tissue of other sites: Secondary | ICD-10-CM | POA: Diagnosis not present

## 2022-07-28 DIAGNOSIS — G4733 Obstructive sleep apnea (adult) (pediatric): Secondary | ICD-10-CM | POA: Diagnosis not present

## 2022-07-31 DIAGNOSIS — G4733 Obstructive sleep apnea (adult) (pediatric): Secondary | ICD-10-CM | POA: Diagnosis not present

## 2022-08-02 DIAGNOSIS — Z967 Presence of other bone and tendon implants: Secondary | ICD-10-CM | POA: Diagnosis not present

## 2022-08-02 DIAGNOSIS — M94212 Chondromalacia, left shoulder: Secondary | ICD-10-CM | POA: Diagnosis not present

## 2022-08-02 DIAGNOSIS — M65812 Other synovitis and tenosynovitis, left shoulder: Secondary | ICD-10-CM | POA: Diagnosis not present

## 2022-08-02 DIAGNOSIS — M19012 Primary osteoarthritis, left shoulder: Secondary | ICD-10-CM | POA: Diagnosis not present

## 2022-08-02 DIAGNOSIS — Z4789 Encounter for other orthopedic aftercare: Secondary | ICD-10-CM | POA: Diagnosis not present

## 2022-08-02 DIAGNOSIS — S43432A Superior glenoid labrum lesion of left shoulder, initial encounter: Secondary | ICD-10-CM | POA: Diagnosis not present

## 2022-08-02 DIAGNOSIS — M7542 Impingement syndrome of left shoulder: Secondary | ICD-10-CM | POA: Diagnosis not present

## 2022-08-02 DIAGNOSIS — M25512 Pain in left shoulder: Secondary | ICD-10-CM | POA: Diagnosis not present

## 2022-08-02 DIAGNOSIS — G8918 Other acute postprocedural pain: Secondary | ICD-10-CM | POA: Diagnosis not present

## 2022-08-02 DIAGNOSIS — M75122 Complete rotator cuff tear or rupture of left shoulder, not specified as traumatic: Secondary | ICD-10-CM | POA: Diagnosis not present

## 2022-08-02 DIAGNOSIS — I10 Essential (primary) hypertension: Secondary | ICD-10-CM | POA: Diagnosis not present

## 2022-08-02 DIAGNOSIS — S43492A Other sprain of left shoulder joint, initial encounter: Secondary | ICD-10-CM | POA: Diagnosis not present

## 2022-08-02 DIAGNOSIS — I890 Lymphedema, not elsewhere classified: Secondary | ICD-10-CM | POA: Diagnosis not present

## 2022-08-07 DIAGNOSIS — E1169 Type 2 diabetes mellitus with other specified complication: Secondary | ICD-10-CM | POA: Diagnosis not present

## 2022-08-10 IMAGING — MG MM DIGITAL SCREENING BILAT W/ TOMO AND CAD
8 series · 8 of 24 positions shown · non-contrast
Comparison: Previous exam(s).

CLINICAL DATA: Screening.

EXAM:
DIGITAL SCREENING BILATERAL MAMMOGRAM WITH TOMOSYNTHESIS AND CAD
TECHNIQUE: Bilateral screening digital craniocaudal and mediolateral oblique
mammograms were obtained. Bilateral screening digital breast
tomosynthesis was performed. The images were evaluated with
computer-aided detection.

[R CC synth-2D]
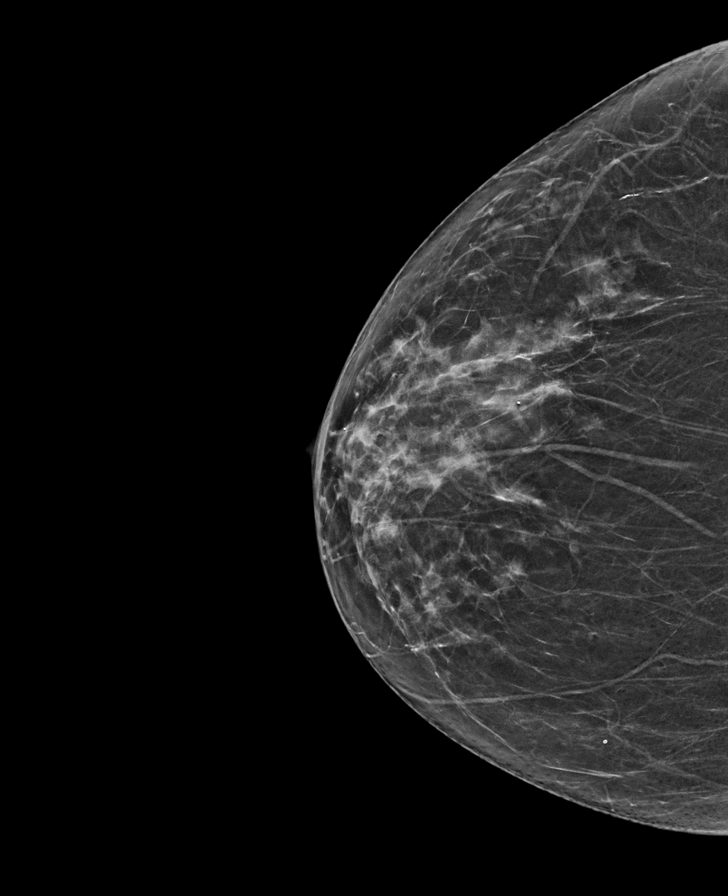

[L MLO synth-2D]
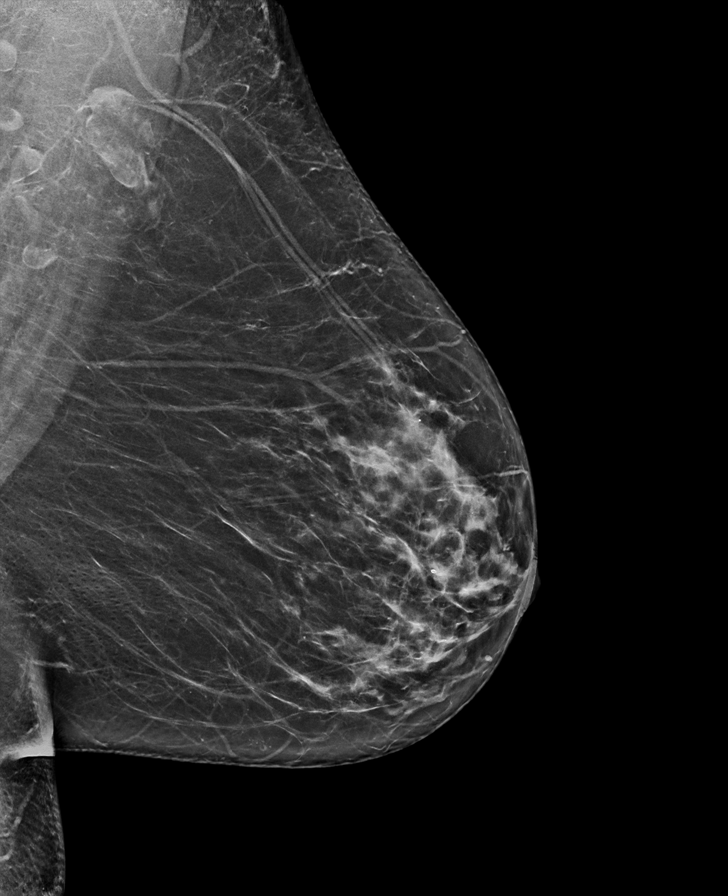

[R MLO synth-2D]
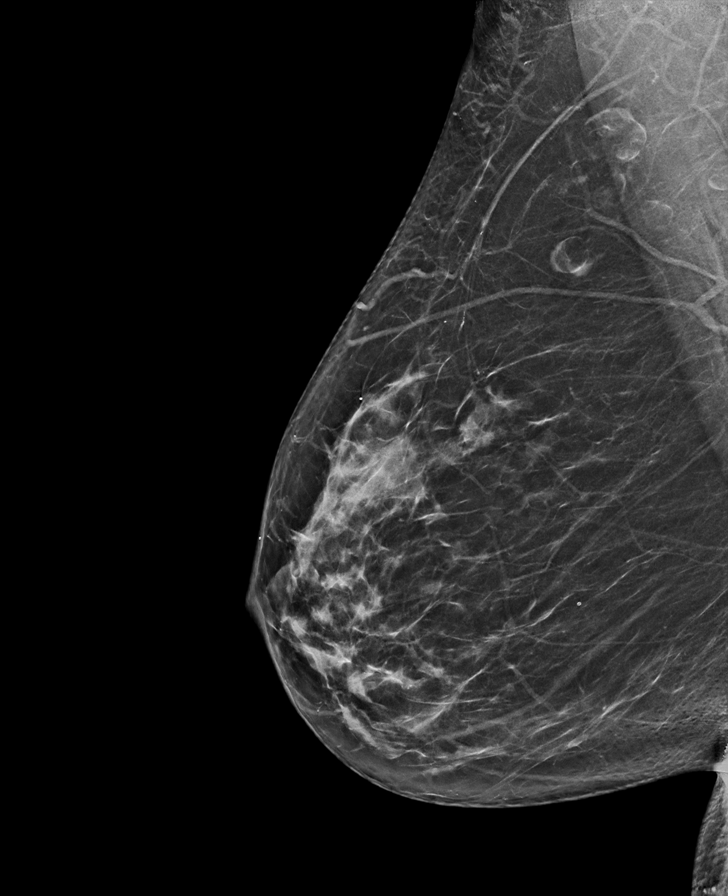

[L CC synth-2D]
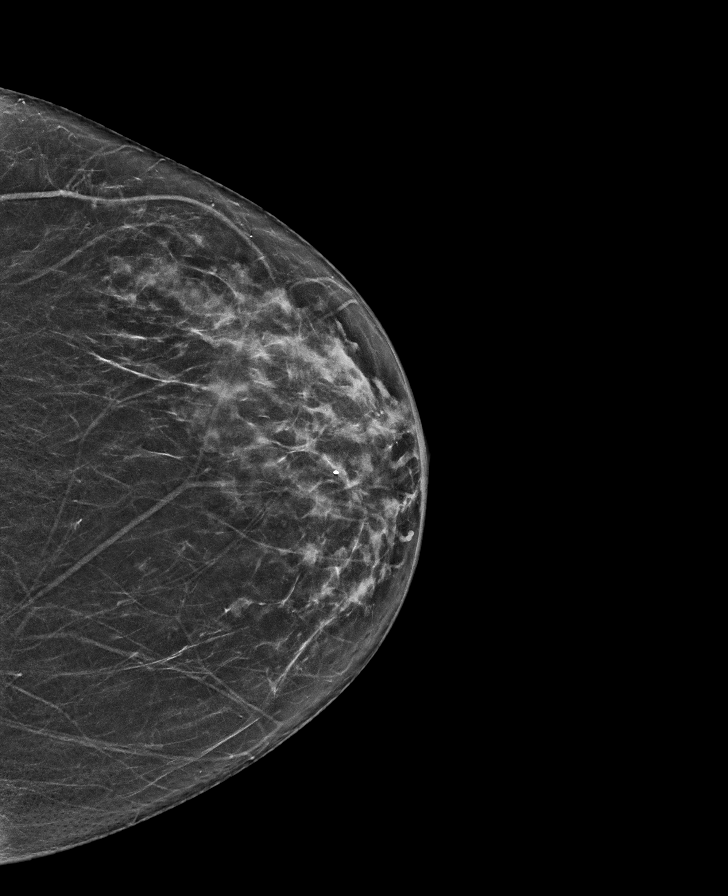

[R CC tomo · tomo slice 31/61.0]
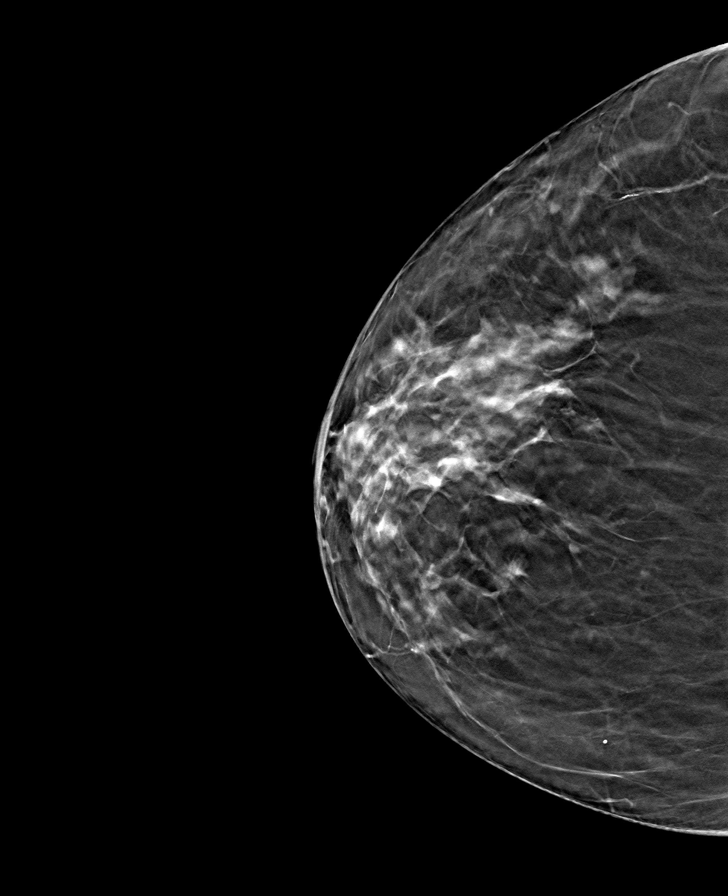

[L MLO tomo · tomo slice 37/74.0]
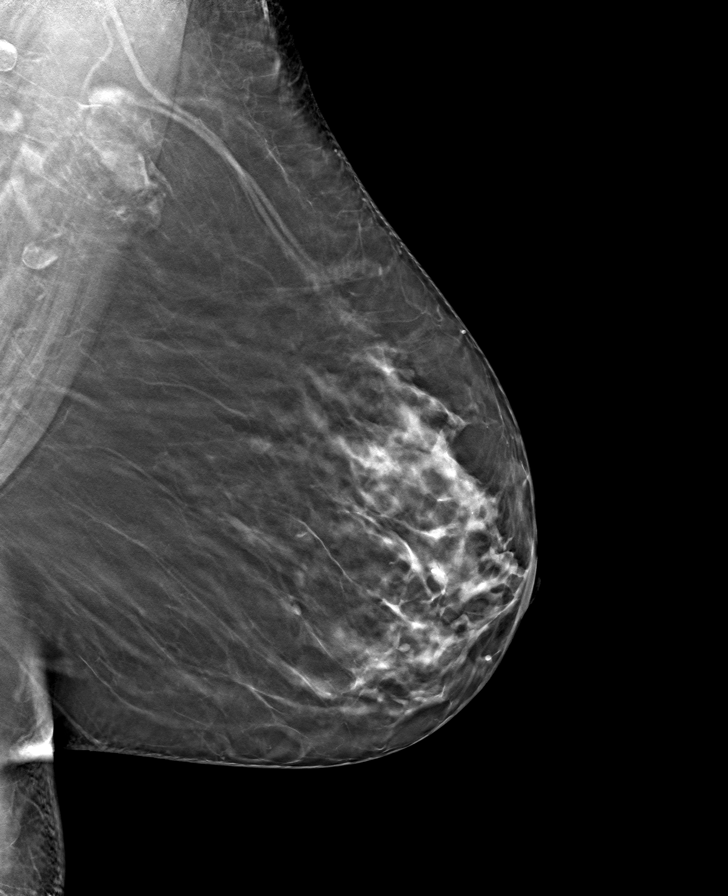

[L CC tomo · tomo slice 31/62.0]
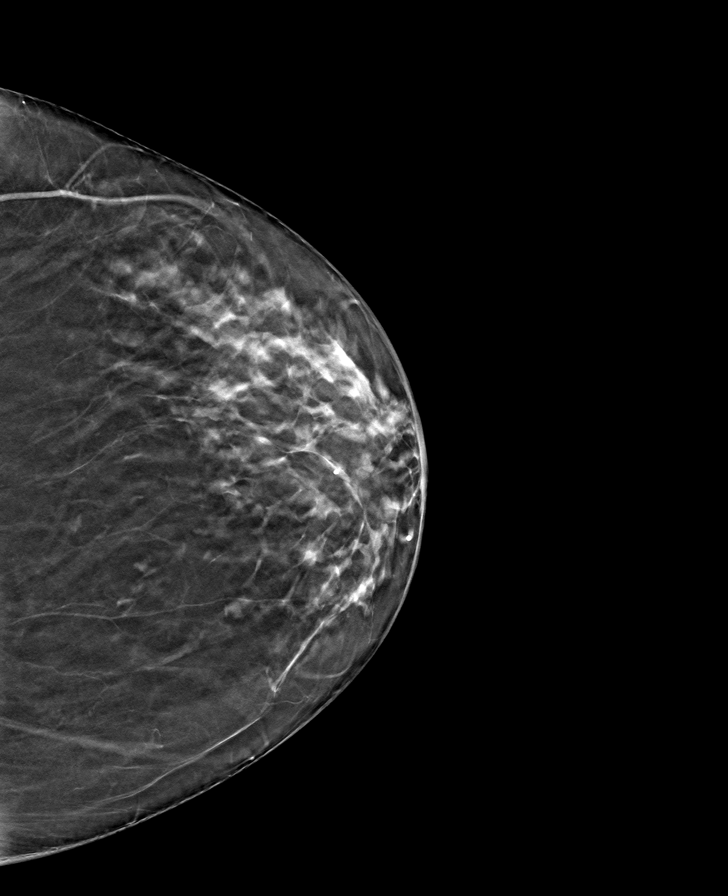

[R MLO tomo · tomo slice 36/71.0]
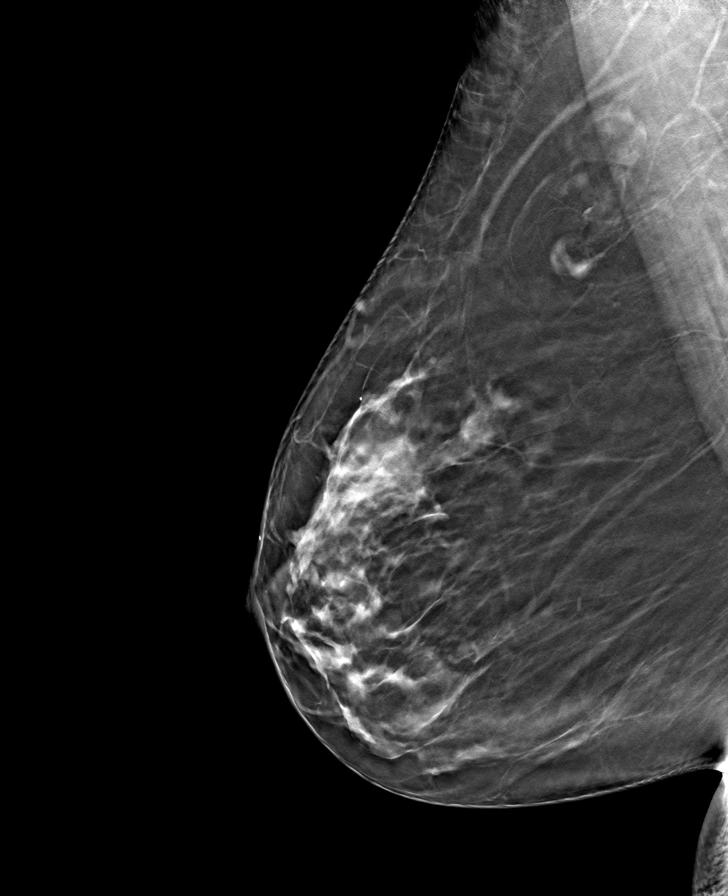

[8 of 24 positions shown; findings below may reference images not displayed]

ACR Breast Density Category c: The breast tissue is heterogeneously
dense, which may obscure small masses.
FINDINGS: There are no findings suspicious for malignancy.
IMPRESSION: No mammographic evidence of malignancy. A result letter of this
screening mammogram will be mailed directly to the patient.

RECOMMENDATION:
Screening mammogram in one year. (Code:Q3-W-BC3)

BI-RADS CATEGORY  1: Negative.

## 2022-08-13 DIAGNOSIS — M25512 Pain in left shoulder: Secondary | ICD-10-CM | POA: Diagnosis not present

## 2022-08-13 DIAGNOSIS — M6281 Muscle weakness (generalized): Secondary | ICD-10-CM | POA: Diagnosis not present

## 2022-08-15 DIAGNOSIS — M25512 Pain in left shoulder: Secondary | ICD-10-CM | POA: Diagnosis not present

## 2022-08-15 DIAGNOSIS — M6281 Muscle weakness (generalized): Secondary | ICD-10-CM | POA: Diagnosis not present

## 2022-08-20 DIAGNOSIS — M25512 Pain in left shoulder: Secondary | ICD-10-CM | POA: Diagnosis not present

## 2022-08-20 DIAGNOSIS — M6281 Muscle weakness (generalized): Secondary | ICD-10-CM | POA: Diagnosis not present

## 2022-08-22 DIAGNOSIS — M25512 Pain in left shoulder: Secondary | ICD-10-CM | POA: Diagnosis not present

## 2022-08-22 DIAGNOSIS — M6281 Muscle weakness (generalized): Secondary | ICD-10-CM | POA: Diagnosis not present

## 2022-08-27 DIAGNOSIS — M6281 Muscle weakness (generalized): Secondary | ICD-10-CM | POA: Diagnosis not present

## 2022-08-27 DIAGNOSIS — G4733 Obstructive sleep apnea (adult) (pediatric): Secondary | ICD-10-CM | POA: Diagnosis not present

## 2022-08-27 DIAGNOSIS — M25512 Pain in left shoulder: Secondary | ICD-10-CM | POA: Diagnosis not present

## 2022-08-29 DIAGNOSIS — M6281 Muscle weakness (generalized): Secondary | ICD-10-CM | POA: Diagnosis not present

## 2022-08-29 DIAGNOSIS — M25512 Pain in left shoulder: Secondary | ICD-10-CM | POA: Diagnosis not present

## 2022-09-03 ENCOUNTER — Ambulatory Visit (INDEPENDENT_AMBULATORY_CARE_PROVIDER_SITE_OTHER): Payer: Medicare PPO

## 2022-09-03 ENCOUNTER — Encounter: Payer: Self-pay | Admitting: Cardiology

## 2022-09-03 ENCOUNTER — Ambulatory Visit: Payer: Medicare PPO | Attending: Cardiology | Admitting: Cardiology

## 2022-09-03 VITALS — BP 130/70 | HR 78 | Ht 62.0 in | Wt 207.8 lb

## 2022-09-03 DIAGNOSIS — R002 Palpitations: Secondary | ICD-10-CM

## 2022-09-03 DIAGNOSIS — E119 Type 2 diabetes mellitus without complications: Secondary | ICD-10-CM | POA: Diagnosis not present

## 2022-09-03 DIAGNOSIS — E669 Obesity, unspecified: Secondary | ICD-10-CM

## 2022-09-03 DIAGNOSIS — I10 Essential (primary) hypertension: Secondary | ICD-10-CM | POA: Diagnosis not present

## 2022-09-03 DIAGNOSIS — Z7985 Long-term (current) use of injectable non-insulin antidiabetic drugs: Secondary | ICD-10-CM

## 2022-09-03 DIAGNOSIS — G4733 Obstructive sleep apnea (adult) (pediatric): Secondary | ICD-10-CM | POA: Diagnosis not present

## 2022-09-03 NOTE — Patient Instructions (Signed)
Medication Instructions:  Your physician recommends that you continue on your current medications as directed. Please refer to the Current Medication list given to you today.  *If you need a refill on your cardiac medications before your next appointment, please call your pharmacy*   Lab Work: None   Testing/Procedures: Christena Deem- Long Term Monitor Instructions  Your physician has requested you wear a ZIO patch monitor for 7 days.  This is a single patch monitor. [employer] supplies one patch monitor per enrollment. Additional stickers are not available. Please do not apply patch if you will be having a Nuclear Stress Test,  Echocardiogram, Cardiac CT, MRI, or Chest Xray during the period you would be wearing the  monitor. The patch cannot be worn during these tests. You cannot remove and re-apply the  ZIO XT patch monitor.  Your ZIO patch monitor will be mailed 3 day USPS to your address on file. It may take 3-5 days  to receive your monitor after you have been enrolled.  Once you have received your monitor, please review the enclosed instructions. Your monitor  has already been registered assigning a specific monitor serial # to you.  Billing and Patient Assistance Program Information  We have supplied [employer] with any of your insurance information on file for billing purposes. [employer] offers a sliding scale Patient Assistance Program for patients that do not have  insurance, or whose insurance does not completely cover the cost of the ZIO monitor.  You must apply for the Patient Assistance Program to qualify for this discounted rate.  To apply, please call [employer] at [phone], select option 4, select option 2, ask to apply for  Patient Assistance Program. Meredeth Ide will ask your household income, and how many people  are in your household. They will quote your out-of-pocket cost based on that information.  [employer] will also be able to set up a 44-month, interest-free payment plan if  needed.  Applying the monitor   Shave hair from upper left chest.  Hold abrader disc by orange tab. Rub abrader in 40 strokes over the upper left chest as  indicated in your monitor instructions.  Clean area with 4 enclosed alcohol pads. Let dry.  Apply patch as indicated in monitor instructions. Patch will be placed under collarbone on left  side of chest with arrow pointing upward.  Rub patch adhesive wings for 2 minutes. Remove white label marked "1". Remove the white  label marked "2". Rub patch adhesive wings for 2 additional minutes.  While looking in a mirror, press and release button in center of patch. A small green light will  flash 3-4 times. This will be your only indicator that the monitor has been turned on.  Do not shower for the first 24 hours. You may shower after the first 24 hours.  Press the button if you feel a symptom. You will hear a small click. Record Date, Time and  Symptom in the Patient Logbook.  When you are ready to remove the patch, follow instructions on the last 2 pages of Patient  Logbook. Stick patch monitor onto the last page of Patient Logbook.  Place Patient Logbook in the blue and white box. Use locking tab on box and tape box closed  securely. The blue and white box has prepaid postage on it. Please place it in the mailbox as  soon as possible. Your physician should have your test results approximately 7 days after the  monitor has been mailed back to [facility].  Call [facility] Customer Care at [phone] if you have questions regarding  your ZIO XT patch monitor. Call them immediately if you see an orange light blinking on your  monitor.  If your monitor falls off in less than 4 days, contact our Monitor department at [phone].  If your monitor becomes loose or falls off after 4 days call [employer] at [phone] for  suggestions on securing your monitor    Follow-Up: At [facility], you and your health needs are  our priority.  As part of our continuing mission to provide you with exceptional heart care, we have created designated Provider Care Teams.  These Care Teams include your primary Cardiologist (physician) and Advanced Practice Providers (APPs -  Physician Assistants and Nurse Practitioners) who all work together to provide you with the care you need, when you need it.   Your next appointment:   9 month(s)  Provider:   Thomasene Ripple, DO

## 2022-09-03 NOTE — Progress Notes (Unsigned)
Cardiology Office Note:    Date:  09/04/2022   ID:  Jade Perez, Chase 1955/06/17, MRN [id number]  PCP:  Abner Greenspan, MD  Cardiologist:  Thomasene Ripple, DO  Electrophysiologist:  None   Referring MD: Abner Greenspan, MD   " I am having palpitations"  History of Present Illness:    Jade Perez is a 67 y.o. 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   She was seen on June 26, 2022 by Carlos Levering, NP at that time she presented for preoperative clearance.  Follow-up she will surgery.  She is status post surgery.  She is doing well.  She is here today with her husband.  She reports that she has been experiencing intermittent palpitations. No dizziness or synccope.  No other complaints at this time.    Past Medical History:  Diagnosis Date   Allergic rhinitis    Asthma, mild intermittent    followed by pcp    (prevously followed by dr Lucie Leather w/ asthma/ allergy center)   Complication of anesthesia    difficulty waking up   Depression    GAD (generalized anxiety disorder)    GERD (gastroesophageal reflux disease)    Hiatal hernia    History of cardiomyopathy in adulthood 12/06/2021   in setting elevated troponin's w/ NSTEMI;  per echo  12-07-2021  ef 50-55%   History of heart murmur in childhood    since birth very slight   History of kidney stones    History of non-ST elevation myocardial infarction (NSTEMI) 12/06/2021   admission in epic---  elevated troponin's w/ NSTEMI  and stress cardiomyopathy,  12-07-2021 per cath normal coronaries ;  cardiology hospital follow-up with Bernadene Person NP 12-26-2021   Hyperlipidemia, mixed    Hypertension    IDA (iron deficiency anemia)    OA (osteoarthritis)    thumb and fingers, knee, right hip   OSA (obstructive sleep apnea)    12-27-2021  per pt OSA yrs ago  last used cpap approx. 2013,  stated retested this year and is waiting on cpap machine to arrive   Renal disorder    Type 2 diabetes mellitus (HCC) 2008   followed by pcp   (12-27-2021  pt sated checks blood sugar am daily fasting, average 155-170)   Wears glasses     Past Surgical History:  Procedure Laterality Date   BREAST EXCISIONAL BIOPSY Right    x2  last one 1980s , benign   CARPAL TUNNEL RELEASE Right 05/22/2017   Procedure: RIGHT CARPAL TUNNEL RELEASE;  Surgeon: Cindee Salt, MD;  Location: [facility];  Service: Orthopedics;  Laterality: Right;   CARPAL TUNNEL RELEASE Left 03/12/2018   Procedure: LEFT CARPAL TUNNEL RELEASE;  Surgeon: Cindee Salt, MD;  Location: [facility];  Service: Orthopedics;  Laterality: Left;   CYSTOSCOPY W/ URETERAL STENT PLACEMENT Bilateral 12/11/2021   Procedure: CYSTOSCOPY , BILATERAL RETROGRADE , BILATERAL STENT PLACEMENT;  Surgeon: Sebastian Ache, MD;  Location: [facility];  Service: Urology;  Laterality: Bilateral;   CYSTOSCOPY WITH RETROGRADE PYELOGRAM, URETEROSCOPY AND STENT PLACEMENT Bilateral 01/02/2022   Procedure: CYSTOSCOPY WITH RETROGRADE PYELOGRAM, URETEROSCOPY AND STENT REPLACEMENT;  Surgeon: Sebastian Ache, MD;  Location: [facility];  Service: Urology;  Laterality: Bilateral;   DIAGNOSTIC LAPAROSCOPY     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   DILATION AND CURETTAGE OF UTERUS  yrs ago for missed ab   EXTRACORPOREAL SHOCK WAVE LITHOTRIPSY  03/2017   HOLMIUM LASER APPLICATION Bilateral 01/02/2022   Procedure: HOLMIUM LASER APPLICATION;  Surgeon: Sebastian Ache, MD;  Location: [facility];  Service: Urology;  Laterality: Bilateral;   LAPAROSCOPIC APPENDECTOMY  2008   LEFT HEART CATH AND CORONARY ANGIOGRAPHY N/A 12/07/2021   Procedure: LEFT HEART CATH AND CORONARY ANGIOGRAPHY;  Surgeon: Lyn Records, MD;  Location: [facility];  Service: Cardiovascular;  Laterality: N/A;   LIPOMA EXCISION   2003   right shoulder   SCLERAL BUCKLE  10/15/2011   Procedure: SCLERAL BUCKLE;  Surgeon: Sherrie George, MD;  Location: [facility];  Service: Ophthalmology;  Laterality: Right;   SHOULDER ARTHROSCOPY WITH ROTATOR CUFF REPAIR Right 03/25/2019   Procedure: Right shoulder arthroscopy, subacromial decompression, distal clavicle resection, rotator cuff repair;  Surgeon: Francena Hanly, MD;  Location: [facility];  Service: Orthopedics;  Laterality: Right;    WISDOM TOOTH EXTRACTION      Current Medications: Current Meds  Medication Sig   acetaminophen (TYLENOL) 500 MG tablet Take 500 mg by mouth every 6 (six) hours as needed for mild pain.   amLODipine (NORVASC) 5 MG tablet Take 1 tablet (5 mg total) by mouth daily. (Patient taking differently: Take 5 mg by mouth daily.)   atorvastatin (LIPITOR) 40 MG tablet Take 40 mg by mouth at bedtime.   b complex vitamins tablet Take 1 tablet by mouth at bedtime.   cetirizine (ZYRTEC) 10 MG tablet Take 10 mg by mouth daily.   citalopram (CELEXA) 40 MG tablet Take 20 mg by mouth daily.   fenofibrate (TRICOR) 145 MG tablet Take 145 mg by mouth at bedtime.   Ferrous Sulfate (SLOW FE PO) Take 1 tablet by mouth daily.   glimepiride (AMARYL) 1 MG tablet Take 0.5 mg by mouth 2 (two) times daily.   montelukast (SINGULAIR) 10 MG tablet TAKE 1 TABLET BY MOUTH EVERY DAY (Patient taking differently: Take 10 mg by mouth at bedtime.)   omeprazole (PRILOSEC) 40 MG capsule TAKE 1 CAPSULE BY MOUTH EVERY DAY IN THE MORNING BEFORE BREAKFAST (Patient taking differently: Take 40 mg by mouth daily.)   OZEMPIC, 1 MG/DOSE, 4 MG/3ML SOPN Inject 2 mg into the skin once a week. Wednesday's   Vitamin D, Ergocalciferol, (DRISDOL) 50000 UNITS CAPS capsule Take 50,000 Units by mouth every 30 (thirty) days.      Allergies:   Amoxicillin, Aspirin, Bee venom, Clarithromycin, Erythromycin, Ibuprofen, Oxycodone, Sulfa antibiotics, Latex, and Tape   Social History   Socioeconomic History    Marital status: Married    Spouse name: Not on file   Number of children: Not on file   Years of education: Not on file   Highest education level: Not on file  Occupational History   Not on file  Tobacco Use   Smoking status: Never   Smokeless tobacco: Never  Vaping Use   Vaping Use: Never used  Substance and Sexual Activity   Alcohol use: No   Drug use: Never   Sexual activity: Not on file  Other Topics Concern   Not on file  Social History Narrative   ** Merged History Encounter **       Social Determinants of Health   Financial Resource Strain: Not on file  Food Insecurity: No Food Insecurity (01/04/2022)   Hunger Vital Sign    Worried About Running Out of Food in the Last Year: Never true  Ran Out of Food in the Last Year: Never true  Transportation Needs: No Transportation Needs (01/04/2022)   PRAPARE - Administrator, Civil Service (Medical): No    Lack of Transportation (Non-Medical): No  Physical Activity: Not on file  Stress: Not on file  Social Connections: Not on file     Family History: The patient's family history includes Colon cancer in her paternal grandmother and paternal uncle; Unexplained death (age of onset: 68) in her sister. There is no history of Allergic rhinitis, Angioedema, Asthma, Atopy, Eczema, Immunodeficiency, Urticaria, Colon polyps, Esophageal cancer, Stomach cancer, Rectal cancer, or Breast cancer.  ROS:   Review of Systems  Constitution: Negative for decreased appetite, fever and weight gain.  HENT: Negative for congestion, ear discharge, hoarse voice and sore throat.   Eyes: Negative for discharge, redness, vision loss in right eye and visual halos.  Cardiovascular: Report palpitations.  Negative for chest pain, dyspnea on exertion, leg swelling, orthopnea. Respiratory: Negative for cough, hemoptysis, shortness of breath and snoring.   Endocrine: Negative for heat intolerance and polyphagia.  Hematologic/Lymphatic:  Negative for bleeding problem. Does not bruise/bleed easily.  Skin: Negative for flushing, nail changes, rash and suspicious lesions.  Musculoskeletal: Negative for arthritis, joint pain, muscle cramps, myalgias, neck pain and stiffness.  Gastrointestinal: Negative for abdominal pain, bowel incontinence, diarrhea and excessive appetite.  Genitourinary: Negative for decreased libido, genital sores and incomplete emptying.  Neurological: Negative for brief paralysis, focal weakness, headaches and loss of balance.  Psychiatric/Behavioral: Negative for altered mental status, depression and suicidal ideas.  Allergic/Immunologic: Negative for HIV exposure and persistent infections.    EKGs/Labs/Other Studies Reviewed:    The following studies were reviewed today:   EKG: None today  Recent Labs: 01/03/2022: ALT 22 01/05/2022: BUN 7; Creatinine, Ser 0.99; Hemoglobin 10.3; Magnesium 1.5; Platelets 271; Potassium 3.9; Sodium 138  Recent Lipid Panel    Component Value Date/Time   CHOL 125 12/07/2021 0510   TRIG 138 12/07/2021 0510   HDL 38 (L) 12/07/2021 0510   CHOLHDL 3.3 12/07/2021 0510   VLDL 28 12/07/2021 0510   LDLCALC 59 12/07/2021 0510    Physical Exam:    VS:  BP 130/70   Pulse 78   Ht 5\' 2"  (1.575 m)   Wt 207 lb 12.8 oz (94.3 kg)   SpO2 98%   BMI 38.01 kg/m     Wt Readings from Last 3 Encounters:  09/03/22 207 lb 12.8 oz (94.3 kg)  03/27/22 192 lb 6.4 oz (87.3 kg)  01/03/22 185 lb (83.9 kg)     GEN: Well nourished, well developed in no acute distress HEENT: Normal NECK: No JVD; No carotid bruits LYMPHATICS: No lymphadenopathy CARDIAC: S1S2 noted,RRR, no murmurs, rubs, gallops RESPIRATORY:  Clear to auscultation without rales, wheezing or rhonchi  ABDOMEN: Soft, non-tender, non-distended, +bowel sounds, no guarding. EXTREMITIES: No edema, No cyanosis, no clubbing MUSCULOSKELETAL:  No deformity  SKIN: Warm and dry NEUROLOGIC:  Alert and oriented x 3,  non-focal PSYCHIATRIC:  Normal affect, good insight  ASSESSMENT:    1. Palpitations   2. OSA (obstructive sleep apnea)   3. Diabetes mellitus type 2, noninsulin dependent (HCC)   4. Obesity (BMI 30-39.9)   5. Primary hypertension    PLAN:     I would like to rule out a cardiovascular etiology of this palpitation, therefore at this time I would like to placed a zio patch for  7  days. I  Hypertension-blood pressure at target. Diabetes  mellitus being managed by the primary provider. OSA-continue CPAP.  The patient understands the need to lose weight with diet and exercise. We have discussed specific strategies for this.  The patient is in agreement with the above plan. The patient left the office in stable condition.  The patient will follow up in   Medication Adjustments/Labs and Tests Ordered: Current medicines are reviewed at length with the patient today.  Concerns regarding medicines are outlined above.  Orders Placed This Encounter  Procedures   LONG TERM MONITOR (3-14 DAYS)   No orders of the defined types were placed in this encounter.   Patient Instructions  Medication Instructions:  Your physician recommends that you continue on your current medications as directed. Please refer to the Current Medication list given to you today.  *If you need a refill on your cardiac medications before your next appointment, please call your pharmacy*   Lab Work: None   Testing/Procedures: Christena Deem- Long Term Monitor Instructions  Your physician has requested you wear a ZIO patch monitor for 7 days.  This is a single patch monitor. [employer] supplies one patch monitor per enrollment. Additional stickers are not available. Please do not apply patch if you will be having a Nuclear Stress Test,  Echocardiogram, Cardiac CT, MRI, or Chest Xray during the period you would be wearing the  monitor. The patch cannot be worn during these tests. You cannot remove and re-apply the  ZIO XT  patch monitor.  Your ZIO patch monitor will be mailed 3 day USPS to your address on file. It may take 3-5 days  to receive your monitor after you have been enrolled.  Once you have received your monitor, please review the enclosed instructions. Your monitor  has already been registered assigning a specific monitor serial # to you.  Billing and Patient Assistance Program Information  We have supplied [employer] with any of your insurance information on file for billing purposes. [employer] offers a sliding scale Patient Assistance Program for patients that do not have  insurance, or whose insurance does not completely cover the cost of the ZIO monitor.  You must apply for the Patient Assistance Program to qualify for this discounted rate.  To apply, please call [employer] at [phone], select option 4, select option 2, ask to apply for  Patient Assistance Program. Meredeth Ide will ask your household income, and how many people  are in your household. They will quote your out-of-pocket cost based on that information.  [employer] will also be able to set up a 79-month, interest-free payment plan if needed.  Applying the monitor   Shave hair from upper left chest.  Hold abrader disc by orange tab. Rub abrader in 40 strokes over the upper left chest as  indicated in your monitor instructions.  Clean area with 4 enclosed alcohol pads. Let dry.  Apply patch as indicated in monitor instructions. Patch will be placed under collarbone on left  side of chest with arrow pointing upward.  Rub patch adhesive wings for 2 minutes. Remove white label marked "1". Remove the white  label marked "2". Rub patch adhesive wings for 2 additional minutes.  While looking in a mirror, press and release button in center of patch. A small green light will  flash 3-4 times. This will be your only indicator that the monitor has been turned on.  Do not shower for the first 24 hours. You may shower after the first 24 hours.  Press  the button if you feel  a symptom. You will hear a small click. Record Date, Time and  Symptom in the Patient Logbook.  When you are ready to remove the patch, follow instructions on the last 2 pages of Patient  Logbook. Stick patch monitor onto the last page of Patient Logbook.  Place Patient Logbook in the blue and white box. Use locking tab on box and tape box closed  securely. The blue and white box has prepaid postage on it. Please place it in the mailbox as  soon as possible. Your physician should have your test results approximately 7 days after the  monitor has been mailed back to [facility].  Call [facility] Customer Care at [phone] if you have questions regarding  your ZIO XT patch monitor. Call them immediately if you see an orange light blinking on your  monitor.  If your monitor falls off in less than 4 days, contact our Monitor department at [phone].  If your monitor becomes loose or falls off after 4 days call [employer] at [phone] for  suggestions on securing your monitor    Follow-Up: At [facility], you and your health needs are our priority.  As part of our continuing mission to provide you with exceptional heart care, we have created designated Provider Care Teams.  These Care Teams include your primary Cardiologist (physician) and Advanced Practice Providers (APPs -  Physician Assistants and Nurse Practitioners) who all work together to provide you with the care you need, when you need it.   Your next appointment:   9 month(s)  Provider:   Thomasene Ripple, DO    Adopting a Healthy Lifestyle.  Know what a healthy weight is for you (roughly BMI <25) and aim to maintain this   Aim for 7+ servings of fruits and vegetables daily   65-80+ fluid ounces of water or unsweet tea for healthy kidneys   Limit to max 1 drink of alcohol per day; avoid smoking/tobacco   Limit animal fats in diet for cholesterol and heart health - choose grass fed  whenever available   Avoid highly processed foods, and foods high in saturated/trans fats   Aim for low stress - take time to unwind and care for your mental health   Aim for 150 min of moderate intensity exercise weekly for heart health, and weights twice weekly for bone health   Aim for 7-9 hours of sleep daily   When it comes to diets, agreement about the perfect plan isnt easy to find, even among the experts. Experts at the [facility] [employer] developed an idea known as the Healthy Eating Plate. Just imagine a plate divided into logical, healthy portions.   The emphasis is on diet quality:   Load up on vegetables and fruits - one-half of your plate: Aim for color and variety, and remember that potatoes dont count.   Go for whole grains - one-quarter of your plate: Whole wheat, barley, wheat berries, quinoa, oats, brown rice, and foods made with them. If you want pasta, go with whole wheat pasta.   Protein power - one-quarter of your plate: Fish, chicken, beans, and nuts are all healthy, versatile protein sources. Limit red meat.   The diet, however, does go beyond the plate, offering a few other suggestions.   Use healthy plant oils, such as olive, canola, soy, corn, sunflower and peanut. Check the labels, and avoid partially hydrogenated oil, which have unhealthy trans fats.   If youre thirsty, drink water. Coffee and tea  are good in moderation, but skip sugary drinks and limit milk and dairy products to one or two daily servings.   The type of carbohydrate in the diet is more important than the amount. Some sources of carbohydrates, such as vegetables, fruits, whole grains, and beans-are healthier than others.   Finally, stay active  Signed, Thomasene Ripple, DO  09/04/2022 8:13 AM    [facility]

## 2022-09-03 NOTE — Progress Notes (Unsigned)
Enrolled patient for a 7 day Zio XT monitor to be mailed to patients home.  

## 2022-09-04 DIAGNOSIS — M25512 Pain in left shoulder: Secondary | ICD-10-CM | POA: Diagnosis not present

## 2022-09-04 DIAGNOSIS — M6281 Muscle weakness (generalized): Secondary | ICD-10-CM | POA: Diagnosis not present

## 2022-09-05 DIAGNOSIS — E1169 Type 2 diabetes mellitus with other specified complication: Secondary | ICD-10-CM | POA: Diagnosis not present

## 2022-09-06 DIAGNOSIS — R002 Palpitations: Secondary | ICD-10-CM | POA: Diagnosis not present

## 2022-09-06 DIAGNOSIS — M25512 Pain in left shoulder: Secondary | ICD-10-CM | POA: Diagnosis not present

## 2022-09-06 DIAGNOSIS — M6281 Muscle weakness (generalized): Secondary | ICD-10-CM | POA: Diagnosis not present

## 2022-09-07 DIAGNOSIS — E1169 Type 2 diabetes mellitus with other specified complication: Secondary | ICD-10-CM | POA: Diagnosis not present

## 2022-09-10 DIAGNOSIS — E1169 Type 2 diabetes mellitus with other specified complication: Secondary | ICD-10-CM | POA: Diagnosis not present

## 2022-09-10 DIAGNOSIS — M25512 Pain in left shoulder: Secondary | ICD-10-CM | POA: Diagnosis not present

## 2022-09-10 DIAGNOSIS — Z136 Encounter for screening for cardiovascular disorders: Secondary | ICD-10-CM | POA: Diagnosis not present

## 2022-09-10 DIAGNOSIS — Z1339 Encounter for screening examination for other mental health and behavioral disorders: Secondary | ICD-10-CM | POA: Diagnosis not present

## 2022-09-10 DIAGNOSIS — N182 Chronic kidney disease, stage 2 (mild): Secondary | ICD-10-CM | POA: Diagnosis not present

## 2022-09-10 DIAGNOSIS — Z0000 Encounter for general adult medical examination without abnormal findings: Secondary | ICD-10-CM | POA: Diagnosis not present

## 2022-09-10 DIAGNOSIS — E785 Hyperlipidemia, unspecified: Secondary | ICD-10-CM | POA: Diagnosis not present

## 2022-09-10 DIAGNOSIS — I129 Hypertensive chronic kidney disease with stage 1 through stage 4 chronic kidney disease, or unspecified chronic kidney disease: Secondary | ICD-10-CM | POA: Diagnosis not present

## 2022-09-10 DIAGNOSIS — M6281 Muscle weakness (generalized): Secondary | ICD-10-CM | POA: Diagnosis not present

## 2022-09-10 DIAGNOSIS — Z1331 Encounter for screening for depression: Secondary | ICD-10-CM | POA: Diagnosis not present

## 2022-09-10 DIAGNOSIS — Z139 Encounter for screening, unspecified: Secondary | ICD-10-CM | POA: Diagnosis not present

## 2022-09-13 DIAGNOSIS — M25512 Pain in left shoulder: Secondary | ICD-10-CM | POA: Diagnosis not present

## 2022-09-13 DIAGNOSIS — M6281 Muscle weakness (generalized): Secondary | ICD-10-CM | POA: Diagnosis not present

## 2022-09-17 DIAGNOSIS — M25512 Pain in left shoulder: Secondary | ICD-10-CM | POA: Diagnosis not present

## 2022-09-17 DIAGNOSIS — M6281 Muscle weakness (generalized): Secondary | ICD-10-CM | POA: Diagnosis not present

## 2022-09-19 DIAGNOSIS — M25512 Pain in left shoulder: Secondary | ICD-10-CM | POA: Diagnosis not present

## 2022-09-19 DIAGNOSIS — M6281 Muscle weakness (generalized): Secondary | ICD-10-CM | POA: Diagnosis not present

## 2022-09-19 DIAGNOSIS — R002 Palpitations: Secondary | ICD-10-CM | POA: Diagnosis not present

## 2022-09-24 DIAGNOSIS — M25512 Pain in left shoulder: Secondary | ICD-10-CM | POA: Diagnosis not present

## 2022-09-24 DIAGNOSIS — M6281 Muscle weakness (generalized): Secondary | ICD-10-CM | POA: Diagnosis not present

## 2022-09-26 DIAGNOSIS — M6281 Muscle weakness (generalized): Secondary | ICD-10-CM | POA: Diagnosis not present

## 2022-09-26 DIAGNOSIS — M25512 Pain in left shoulder: Secondary | ICD-10-CM | POA: Diagnosis not present

## 2022-09-27 DIAGNOSIS — G4733 Obstructive sleep apnea (adult) (pediatric): Secondary | ICD-10-CM | POA: Diagnosis not present

## 2022-10-01 DIAGNOSIS — M25512 Pain in left shoulder: Secondary | ICD-10-CM | POA: Diagnosis not present

## 2022-10-01 DIAGNOSIS — Z6836 Body mass index (BMI) 36.0-36.9, adult: Secondary | ICD-10-CM | POA: Diagnosis not present

## 2022-10-01 DIAGNOSIS — M6281 Muscle weakness (generalized): Secondary | ICD-10-CM | POA: Diagnosis not present

## 2022-10-01 DIAGNOSIS — L751 Chromhidrosis: Secondary | ICD-10-CM | POA: Diagnosis not present

## 2022-10-03 DIAGNOSIS — G4733 Obstructive sleep apnea (adult) (pediatric): Secondary | ICD-10-CM | POA: Diagnosis not present

## 2022-10-07 DIAGNOSIS — E785 Hyperlipidemia, unspecified: Secondary | ICD-10-CM | POA: Diagnosis not present

## 2022-10-07 DIAGNOSIS — E1169 Type 2 diabetes mellitus with other specified complication: Secondary | ICD-10-CM | POA: Diagnosis not present

## 2022-10-07 DIAGNOSIS — M6281 Muscle weakness (generalized): Secondary | ICD-10-CM | POA: Diagnosis not present

## 2022-10-07 DIAGNOSIS — M25512 Pain in left shoulder: Secondary | ICD-10-CM | POA: Diagnosis not present

## 2022-10-09 DIAGNOSIS — M6281 Muscle weakness (generalized): Secondary | ICD-10-CM | POA: Diagnosis not present

## 2022-10-09 DIAGNOSIS — M25512 Pain in left shoulder: Secondary | ICD-10-CM | POA: Diagnosis not present

## 2022-10-14 DIAGNOSIS — M25512 Pain in left shoulder: Secondary | ICD-10-CM | POA: Diagnosis not present

## 2022-10-14 DIAGNOSIS — M6281 Muscle weakness (generalized): Secondary | ICD-10-CM | POA: Diagnosis not present

## 2022-10-16 DIAGNOSIS — M25512 Pain in left shoulder: Secondary | ICD-10-CM | POA: Diagnosis not present

## 2022-10-16 DIAGNOSIS — M6281 Muscle weakness (generalized): Secondary | ICD-10-CM | POA: Diagnosis not present

## 2022-10-18 ENCOUNTER — Encounter: Payer: Self-pay | Admitting: Cardiology

## 2022-10-18 ENCOUNTER — Encounter (INDEPENDENT_AMBULATORY_CARE_PROVIDER_SITE_OTHER): Payer: Medicare PPO | Admitting: Ophthalmology

## 2022-10-18 DIAGNOSIS — H35033 Hypertensive retinopathy, bilateral: Secondary | ICD-10-CM

## 2022-10-18 DIAGNOSIS — H35341 Macular cyst, hole, or pseudohole, right eye: Secondary | ICD-10-CM | POA: Diagnosis not present

## 2022-10-18 DIAGNOSIS — H33302 Unspecified retinal break, left eye: Secondary | ICD-10-CM

## 2022-10-18 DIAGNOSIS — H338 Other retinal detachments: Secondary | ICD-10-CM

## 2022-10-18 DIAGNOSIS — H43813 Vitreous degeneration, bilateral: Secondary | ICD-10-CM

## 2022-10-18 DIAGNOSIS — I10 Essential (primary) hypertension: Secondary | ICD-10-CM | POA: Diagnosis not present

## 2022-10-21 NOTE — Telephone Encounter (Signed)
Appt made. See chart.

## 2022-10-24 ENCOUNTER — Ambulatory Visit: Payer: Medicare PPO | Attending: Internal Medicine | Admitting: Cardiology

## 2022-10-24 ENCOUNTER — Encounter: Payer: Self-pay | Admitting: Cardiology

## 2022-10-24 VITALS — BP 137/87 | HR 72 | Wt 198.0 lb

## 2022-10-24 DIAGNOSIS — G4733 Obstructive sleep apnea (adult) (pediatric): Secondary | ICD-10-CM | POA: Diagnosis not present

## 2022-10-24 DIAGNOSIS — I10 Essential (primary) hypertension: Secondary | ICD-10-CM

## 2022-10-24 DIAGNOSIS — M6281 Muscle weakness (generalized): Secondary | ICD-10-CM | POA: Diagnosis not present

## 2022-10-24 DIAGNOSIS — E669 Obesity, unspecified: Secondary | ICD-10-CM

## 2022-10-24 DIAGNOSIS — M25512 Pain in left shoulder: Secondary | ICD-10-CM | POA: Diagnosis not present

## 2022-10-24 DIAGNOSIS — I493 Ventricular premature depolarization: Secondary | ICD-10-CM

## 2022-10-24 DIAGNOSIS — E119 Type 2 diabetes mellitus without complications: Secondary | ICD-10-CM

## 2022-10-24 NOTE — Progress Notes (Signed)
Virtual Visit via Video Note   Because of Jade Perez co-morbid illnesses, she is at least at moderate risk for complications without adequate follow up.  This format is felt to be most appropriate for this patient at this time.  All issues noted in this document were discussed and addressed.  A limited physical exam was performed with this format.  Please refer to the patient's chart for her consent to telehealth for [facility].      Date:  10/24/2022   ID:  Jade, Perez [birth year], MRN [id number]  Patient Location: Home Provider Location: Home Office  PCP:  Abner Greenspan, MD  Cardiologist:  Thomasene Ripple, DO  Electrophysiologist:  None   Evaluation Performed:  Follow-Up Visit  Chief Complaint:  " I am ok"  History of Present Illness:    Jade Perez is a 67 y.o. 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     The patient does not have symptoms concerning for COVID-19 infection (fever, chills, cough, or new shortness of breath).    Past Medical History:  Diagnosis Date   Allergic rhinitis    Asthma, mild intermittent    followed by pcp    (prevously followed by dr Lucie Leather w/ asthma/ allergy center)   Complication of anesthesia    difficulty waking up   Depression    GAD (generalized anxiety disorder)    GERD (gastroesophageal reflux disease)    Hiatal hernia    History of cardiomyopathy in adulthood 12/06/2021   in setting elevated troponin's w/ NSTEMI;  per echo  12-07-2021  ef 50-55%   History of heart murmur in childhood    since birth very slight   History of kidney stones    History of non-ST elevation myocardial infarction (NSTEMI) 12/06/2021   admission in epic---  elevated troponin's w/ NSTEMI  and stress cardiomyopathy,  12-07-2021 per cath normal coronaries ;   cardiology hospital follow-up with Bernadene Person NP 12-26-2021   Hyperlipidemia, mixed    Hypertension    IDA (iron deficiency anemia)    OA (osteoarthritis)    thumb and fingers, knee, right hip   OSA (obstructive sleep apnea)    12-27-2021  per pt OSA yrs ago last used cpap approx. 2013,  stated retested this year and is waiting on cpap machine to arrive   Renal disorder    Type 2 diabetes mellitus (HCC) 2008   followed by pcp   (12-27-2021  pt sated checks blood sugar am daily fasting, average 155-170)   Wears glasses    Past Surgical History:  Procedure Laterality Date   BREAST EXCISIONAL BIOPSY Right    x2  last one 1980s , benign   CARPAL TUNNEL RELEASE Right 05/22/2017   Procedure: RIGHT CARPAL TUNNEL RELEASE;  Surgeon: Cindee Salt, MD;  Location: [facility];  Service: Orthopedics;  Laterality: Right;   CARPAL TUNNEL RELEASE Left 03/12/2018   Procedure: LEFT CARPAL TUNNEL RELEASE;  Surgeon: Cindee Salt, MD;  Location: [facility];  Service: Orthopedics;  Laterality: Left;   CYSTOSCOPY W/ URETERAL STENT PLACEMENT Bilateral 12/11/2021   Procedure: CYSTOSCOPY , BILATERAL RETROGRADE , BILATERAL STENT PLACEMENT;  Surgeon: Sebastian Ache, MD;  Location: [facility] OR;  Service: Urology;  Laterality: Bilateral;   CYSTOSCOPY WITH RETROGRADE PYELOGRAM, URETEROSCOPY AND STENT PLACEMENT Bilateral 01/02/2022   Procedure: CYSTOSCOPY  WITH RETROGRADE PYELOGRAM, URETEROSCOPY AND STENT REPLACEMENT;  Surgeon: Sebastian Ache, MD;  Location: [facility];  Service: Urology;  Laterality: Bilateral;   DIAGNOSTIC LAPAROSCOPY     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   DILATION AND CURETTAGE OF UTERUS     yrs ago for missed ab   EXTRACORPOREAL SHOCK WAVE LITHOTRIPSY  03/2017   HOLMIUM LASER APPLICATION Bilateral 01/02/2022   Procedure: HOLMIUM LASER APPLICATION;  Surgeon: Sebastian Ache, MD;  Location: [facility];  Service: Urology;  Laterality: Bilateral;    LAPAROSCOPIC APPENDECTOMY  2008   LEFT HEART CATH AND CORONARY ANGIOGRAPHY N/A 12/07/2021   Procedure: LEFT HEART CATH AND CORONARY ANGIOGRAPHY;  Surgeon: Lyn Records, MD;  Location: [facility];  Service: Cardiovascular;  Laterality: N/A;   LIPOMA EXCISION  2003   right shoulder   SCLERAL BUCKLE  10/15/2011   Procedure: SCLERAL BUCKLE;  Surgeon: Sherrie George, MD;  Location: [facility] OR;  Service: Ophthalmology;  Laterality: Right;   SHOULDER ARTHROSCOPY WITH ROTATOR CUFF REPAIR Right 03/25/2019   Procedure: Right shoulder arthroscopy, subacromial decompression, distal clavicle resection, rotator cuff repair;  Surgeon: Francena Hanly, MD;  Location: [facility];  Service: Orthopedics;  Laterality: Right;    WISDOM TOOTH EXTRACTION       Current Meds  Medication Sig   amLODipine (NORVASC) 5 MG tablet Take 1 tablet (5 mg total) by mouth daily. (Patient taking differently: Take 5 mg by mouth daily.)   atorvastatin (LIPITOR) 40 MG tablet Take 40 mg by mouth at bedtime.   b complex vitamins tablet Take 1 tablet by mouth at bedtime.   cetirizine (ZYRTEC) 10 MG tablet Take 10 mg by mouth daily.   citalopram (CELEXA) 40 MG tablet Take 20 mg by mouth daily.   fenofibrate (TRICOR) 145 MG tablet Take 145 mg by mouth at bedtime.   glimepiride (AMARYL) 1 MG tablet Take 0.5 mg by mouth 2 (two) times daily.   montelukast (SINGULAIR) 10 MG tablet TAKE 1 TABLET BY MOUTH EVERY DAY (Patient taking differently: Take 10 mg by mouth at bedtime.)   omeprazole (PRILOSEC) 40 MG capsule TAKE 1 CAPSULE BY MOUTH EVERY DAY IN THE MORNING BEFORE BREAKFAST (Patient taking differently: Take 40 mg by mouth daily.)   OZEMPIC, 1 MG/DOSE, 4 MG/3ML SOPN Inject 2 mg into the skin once a week. Wednesday's     Allergies:   Amoxicillin, Aspirin, Bee venom, Clarithromycin, Erythromycin, Ibuprofen, Oxycodone, Sulfa antibiotics, Latex, and Tape   Social History   Tobacco Use   Smoking status: Never   Smokeless tobacco:  Never  Vaping Use   Vaping status: Never Used  Substance Use Topics   Alcohol use: No   Drug use: Never     Family Hx: The patient's family history includes Colon cancer in her paternal grandmother and paternal uncle; Unexplained death (age of onset: 15) in her sister. There is no history of Allergic rhinitis, Angioedema, Asthma, Atopy, Eczema, Immunodeficiency, Urticaria, Colon polyps, Esophageal cancer, Stomach cancer, Rectal cancer, or Breast cancer.  ROS:   Please see the history of present illness.    Palpitations  All other systems reviewed and are negative.   Prior CV studies:   The following studies were reviewed today:  Reviewed   Labs/Other Tests and Data Reviewed:    EKG:  No ECG reviewed.  Recent Labs: 01/03/2022: ALT 22 01/05/2022: BUN 7; Creatinine, Ser 0.99; Hemoglobin 10.3; Magnesium 1.5; Platelets 271; Potassium 3.9; Sodium 138   Recent Lipid  Panel Lab Results  Component Value Date/Time   CHOL 125 12/07/2021 05:10 AM   TRIG 138 12/07/2021 05:10 AM   HDL 38 (L) 12/07/2021 05:10 AM   CHOLHDL 3.3 12/07/2021 05:10 AM   LDLCALC 59 12/07/2021 05:10 AM    Wt Readings from Last 3 Encounters:  10/24/22 198 lb (89.8 kg)  09/03/22 207 lb 12.8 oz (94.3 kg)  03/27/22 192 lb 6.4 oz (87.3 kg)     Objective:    Vital Signs:  BP 137/87 (BP Location: Left Arm, Patient Position: Sitting, Cuff Size: Normal)   Pulse 72   Wt 198 lb (89.8 kg)   BMI 36.21 kg/m    Virtual visit no physical exam  ASSESSMENT & PLAN:    Rare symptomatic PVC  OSA  Diabetes Mellitus  Obesity  Primary Hypertension    Discussed her monitor results. All her questions were answered during this visit.  Shared decision no need for any medications at this time.  She can tolerate her rare symptomatic PVCs.   Continue use of CPAP  The patient understands the need to lose weight with diet and exercise. We have discussed specific strategies for this.  Blood pressure is acceptable,  continue with current antihypertensive regimen.  The patient is in agreement with the above plan. The patient left the office in stable condition.  The patient will follow up in  COVID-19 Education: The signs and symptoms of COVID-19 were discussed with the patient and how to seek care for testing (follow up with PCP or arrange E-visit).  The importance of social distancing was discussed today.  Time:   Today, I have spent 15 minutes with the patient with telehealth technology discussing the above problems.     Medication Adjustments/Labs and Tests Ordered: Current medicines are reviewed at length with the patient today.  Concerns regarding medicines are outlined above.   Tests Ordered: No orders of the defined types were placed in this encounter.   Medication Changes: No orders of the defined types were placed in this encounter.   Follow Up:  In Person in 9 week(s)  Signed, Thomasene Ripple, DO  10/24/2022 1:39 PM    [facility]

## 2022-10-27 DIAGNOSIS — G4733 Obstructive sleep apnea (adult) (pediatric): Secondary | ICD-10-CM | POA: Diagnosis not present

## 2022-10-28 DIAGNOSIS — M25512 Pain in left shoulder: Secondary | ICD-10-CM | POA: Diagnosis not present

## 2022-10-28 DIAGNOSIS — M6281 Muscle weakness (generalized): Secondary | ICD-10-CM | POA: Diagnosis not present

## 2022-10-31 DIAGNOSIS — M25512 Pain in left shoulder: Secondary | ICD-10-CM | POA: Diagnosis not present

## 2022-10-31 DIAGNOSIS — M6281 Muscle weakness (generalized): Secondary | ICD-10-CM | POA: Diagnosis not present

## 2022-11-04 DIAGNOSIS — M6281 Muscle weakness (generalized): Secondary | ICD-10-CM | POA: Diagnosis not present

## 2022-11-04 DIAGNOSIS — M25512 Pain in left shoulder: Secondary | ICD-10-CM | POA: Diagnosis not present

## 2022-11-06 DIAGNOSIS — M25512 Pain in left shoulder: Secondary | ICD-10-CM | POA: Diagnosis not present

## 2022-11-06 DIAGNOSIS — M6281 Muscle weakness (generalized): Secondary | ICD-10-CM | POA: Diagnosis not present

## 2022-11-07 DIAGNOSIS — E785 Hyperlipidemia, unspecified: Secondary | ICD-10-CM | POA: Diagnosis not present

## 2022-11-07 DIAGNOSIS — E1169 Type 2 diabetes mellitus with other specified complication: Secondary | ICD-10-CM | POA: Diagnosis not present

## 2022-11-12 DIAGNOSIS — M25512 Pain in left shoulder: Secondary | ICD-10-CM | POA: Diagnosis not present

## 2022-11-12 DIAGNOSIS — M6281 Muscle weakness (generalized): Secondary | ICD-10-CM | POA: Diagnosis not present

## 2022-11-13 ENCOUNTER — Other Ambulatory Visit: Payer: Self-pay | Admitting: Family Medicine

## 2022-11-13 DIAGNOSIS — Z1231 Encounter for screening mammogram for malignant neoplasm of breast: Secondary | ICD-10-CM

## 2022-11-18 DIAGNOSIS — Z4789 Encounter for other orthopedic aftercare: Secondary | ICD-10-CM | POA: Diagnosis not present

## 2022-11-20 ENCOUNTER — Ambulatory Visit
Admission: RE | Admit: 2022-11-20 | Discharge: 2022-11-20 | Disposition: A | Discharge status: Home / Self Care | Payer: Medicare PPO | Source: Ambulatory Visit | Attending: Family Medicine | Admitting: Family Medicine

## 2022-11-20 DIAGNOSIS — Z1231 Encounter for screening mammogram for malignant neoplasm of breast: Secondary | ICD-10-CM

## 2022-11-21 DIAGNOSIS — M25512 Pain in left shoulder: Secondary | ICD-10-CM | POA: Diagnosis not present

## 2022-11-21 DIAGNOSIS — M6281 Muscle weakness (generalized): Secondary | ICD-10-CM | POA: Diagnosis not present

## 2022-11-27 DIAGNOSIS — G4733 Obstructive sleep apnea (adult) (pediatric): Secondary | ICD-10-CM | POA: Diagnosis not present

## 2022-12-08 DIAGNOSIS — E1169 Type 2 diabetes mellitus with other specified complication: Secondary | ICD-10-CM | POA: Diagnosis not present

## 2022-12-08 DIAGNOSIS — E785 Hyperlipidemia, unspecified: Secondary | ICD-10-CM | POA: Diagnosis not present

## 2022-12-17 DIAGNOSIS — E1169 Type 2 diabetes mellitus with other specified complication: Secondary | ICD-10-CM | POA: Diagnosis not present

## 2022-12-17 DIAGNOSIS — I129 Hypertensive chronic kidney disease with stage 1 through stage 4 chronic kidney disease, or unspecified chronic kidney disease: Secondary | ICD-10-CM | POA: Diagnosis not present

## 2022-12-24 DIAGNOSIS — N182 Chronic kidney disease, stage 2 (mild): Secondary | ICD-10-CM | POA: Diagnosis not present

## 2022-12-24 DIAGNOSIS — E1169 Type 2 diabetes mellitus with other specified complication: Secondary | ICD-10-CM | POA: Diagnosis not present

## 2022-12-24 DIAGNOSIS — E785 Hyperlipidemia, unspecified: Secondary | ICD-10-CM | POA: Diagnosis not present

## 2022-12-24 DIAGNOSIS — I129 Hypertensive chronic kidney disease with stage 1 through stage 4 chronic kidney disease, or unspecified chronic kidney disease: Secondary | ICD-10-CM | POA: Diagnosis not present

## 2022-12-24 DIAGNOSIS — Z6835 Body mass index (BMI) 35.0-35.9, adult: Secondary | ICD-10-CM | POA: Diagnosis not present

## 2022-12-28 DIAGNOSIS — G4733 Obstructive sleep apnea (adult) (pediatric): Secondary | ICD-10-CM | POA: Diagnosis not present

## 2023-01-07 DIAGNOSIS — E66812 Obesity, class 2: Secondary | ICD-10-CM | POA: Diagnosis not present

## 2023-01-07 DIAGNOSIS — Z1231 Encounter for screening mammogram for malignant neoplasm of breast: Secondary | ICD-10-CM | POA: Diagnosis not present

## 2023-01-07 DIAGNOSIS — J069 Acute upper respiratory infection, unspecified: Secondary | ICD-10-CM | POA: Diagnosis not present

## 2023-01-07 DIAGNOSIS — Z20822 Contact with and (suspected) exposure to covid-19: Secondary | ICD-10-CM | POA: Diagnosis not present

## 2023-01-07 DIAGNOSIS — Z6835 Body mass index (BMI) 35.0-35.9, adult: Secondary | ICD-10-CM | POA: Diagnosis not present

## 2023-01-23 DIAGNOSIS — M2041 Other hammer toe(s) (acquired), right foot: Secondary | ICD-10-CM | POA: Diagnosis not present

## 2023-01-23 DIAGNOSIS — L84 Corns and callosities: Secondary | ICD-10-CM | POA: Diagnosis not present

## 2023-01-23 DIAGNOSIS — Z7985 Long-term (current) use of injectable non-insulin antidiabetic drugs: Secondary | ICD-10-CM | POA: Diagnosis not present

## 2023-01-23 DIAGNOSIS — E119 Type 2 diabetes mellitus without complications: Secondary | ICD-10-CM | POA: Diagnosis not present

## 2023-01-23 DIAGNOSIS — M21611 Bunion of right foot: Secondary | ICD-10-CM | POA: Diagnosis not present

## 2023-01-27 DIAGNOSIS — G4733 Obstructive sleep apnea (adult) (pediatric): Secondary | ICD-10-CM | POA: Diagnosis not present

## 2023-02-04 DIAGNOSIS — Z23 Encounter for immunization: Secondary | ICD-10-CM | POA: Diagnosis not present

## 2023-02-07 DIAGNOSIS — E1169 Type 2 diabetes mellitus with other specified complication: Secondary | ICD-10-CM | POA: Diagnosis not present

## 2023-02-07 DIAGNOSIS — E785 Hyperlipidemia, unspecified: Secondary | ICD-10-CM | POA: Diagnosis not present

## 2023-02-10 DIAGNOSIS — E785 Hyperlipidemia, unspecified: Secondary | ICD-10-CM | POA: Diagnosis not present

## 2023-02-10 DIAGNOSIS — Z6835 Body mass index (BMI) 35.0-35.9, adult: Secondary | ICD-10-CM | POA: Diagnosis not present

## 2023-02-10 DIAGNOSIS — M19041 Primary osteoarthritis, right hand: Secondary | ICD-10-CM | POA: Diagnosis not present

## 2023-02-10 DIAGNOSIS — E1169 Type 2 diabetes mellitus with other specified complication: Secondary | ICD-10-CM | POA: Diagnosis not present

## 2023-02-10 DIAGNOSIS — B078 Other viral warts: Secondary | ICD-10-CM | POA: Diagnosis not present

## 2023-02-12 DIAGNOSIS — G4733 Obstructive sleep apnea (adult) (pediatric): Secondary | ICD-10-CM | POA: Diagnosis not present

## 2023-02-27 DIAGNOSIS — G4733 Obstructive sleep apnea (adult) (pediatric): Secondary | ICD-10-CM | POA: Diagnosis not present

## 2023-03-09 DIAGNOSIS — E785 Hyperlipidemia, unspecified: Secondary | ICD-10-CM | POA: Diagnosis not present

## 2023-03-09 DIAGNOSIS — E1169 Type 2 diabetes mellitus with other specified complication: Secondary | ICD-10-CM | POA: Diagnosis not present

## 2023-03-10 DIAGNOSIS — N202 Calculus of kidney with calculus of ureter: Secondary | ICD-10-CM | POA: Diagnosis not present

## 2023-03-19 DIAGNOSIS — I129 Hypertensive chronic kidney disease with stage 1 through stage 4 chronic kidney disease, or unspecified chronic kidney disease: Secondary | ICD-10-CM | POA: Diagnosis not present

## 2023-03-19 DIAGNOSIS — E1169 Type 2 diabetes mellitus with other specified complication: Secondary | ICD-10-CM | POA: Diagnosis not present

## 2023-03-24 DIAGNOSIS — N182 Chronic kidney disease, stage 2 (mild): Secondary | ICD-10-CM | POA: Diagnosis not present

## 2023-03-24 DIAGNOSIS — E1169 Type 2 diabetes mellitus with other specified complication: Secondary | ICD-10-CM | POA: Diagnosis not present

## 2023-03-24 DIAGNOSIS — E785 Hyperlipidemia, unspecified: Secondary | ICD-10-CM | POA: Diagnosis not present

## 2023-03-24 DIAGNOSIS — Z6835 Body mass index (BMI) 35.0-35.9, adult: Secondary | ICD-10-CM | POA: Diagnosis not present

## 2023-03-24 DIAGNOSIS — I129 Hypertensive chronic kidney disease with stage 1 through stage 4 chronic kidney disease, or unspecified chronic kidney disease: Secondary | ICD-10-CM | POA: Diagnosis not present

## 2023-03-24 DIAGNOSIS — Z1331 Encounter for screening for depression: Secondary | ICD-10-CM | POA: Diagnosis not present

## 2023-03-29 DIAGNOSIS — G4733 Obstructive sleep apnea (adult) (pediatric): Secondary | ICD-10-CM | POA: Diagnosis not present

## 2023-04-09 DIAGNOSIS — E1169 Type 2 diabetes mellitus with other specified complication: Secondary | ICD-10-CM | POA: Diagnosis not present

## 2023-04-09 DIAGNOSIS — E785 Hyperlipidemia, unspecified: Secondary | ICD-10-CM | POA: Diagnosis not present

## 2023-04-25 DIAGNOSIS — E119 Type 2 diabetes mellitus without complications: Secondary | ICD-10-CM | POA: Diagnosis not present

## 2023-04-25 DIAGNOSIS — H40013 Open angle with borderline findings, low risk, bilateral: Secondary | ICD-10-CM | POA: Diagnosis not present

## 2023-04-25 DIAGNOSIS — H33312 Horseshoe tear of retina without detachment, left eye: Secondary | ICD-10-CM | POA: Diagnosis not present

## 2023-04-25 DIAGNOSIS — H25813 Combined forms of age-related cataract, bilateral: Secondary | ICD-10-CM | POA: Diagnosis not present

## 2023-05-01 DIAGNOSIS — Z1211 Encounter for screening for malignant neoplasm of colon: Secondary | ICD-10-CM | POA: Diagnosis not present

## 2023-05-01 DIAGNOSIS — Z0000 Encounter for general adult medical examination without abnormal findings: Secondary | ICD-10-CM | POA: Diagnosis not present

## 2023-05-01 DIAGNOSIS — Z6835 Body mass index (BMI) 35.0-35.9, adult: Secondary | ICD-10-CM | POA: Diagnosis not present

## 2023-05-01 DIAGNOSIS — Z139 Encounter for screening, unspecified: Secondary | ICD-10-CM | POA: Diagnosis not present

## 2023-05-01 DIAGNOSIS — Z1339 Encounter for screening examination for other mental health and behavioral disorders: Secondary | ICD-10-CM | POA: Diagnosis not present

## 2023-05-01 DIAGNOSIS — Z136 Encounter for screening for cardiovascular disorders: Secondary | ICD-10-CM | POA: Diagnosis not present

## 2023-05-01 DIAGNOSIS — Z1389 Encounter for screening for other disorder: Secondary | ICD-10-CM | POA: Diagnosis not present

## 2023-05-01 DIAGNOSIS — B3731 Acute candidiasis of vulva and vagina: Secondary | ICD-10-CM | POA: Diagnosis not present

## 2023-05-01 DIAGNOSIS — Z1331 Encounter for screening for depression: Secondary | ICD-10-CM | POA: Diagnosis not present

## 2023-05-10 DIAGNOSIS — E785 Hyperlipidemia, unspecified: Secondary | ICD-10-CM | POA: Diagnosis not present

## 2023-05-10 DIAGNOSIS — E1169 Type 2 diabetes mellitus with other specified complication: Secondary | ICD-10-CM | POA: Diagnosis not present

## 2023-05-16 DIAGNOSIS — G4733 Obstructive sleep apnea (adult) (pediatric): Secondary | ICD-10-CM | POA: Diagnosis not present

## 2023-05-28 DIAGNOSIS — J329 Chronic sinusitis, unspecified: Secondary | ICD-10-CM | POA: Diagnosis not present

## 2023-06-07 DIAGNOSIS — E1169 Type 2 diabetes mellitus with other specified complication: Secondary | ICD-10-CM | POA: Diagnosis not present

## 2023-06-07 DIAGNOSIS — E785 Hyperlipidemia, unspecified: Secondary | ICD-10-CM | POA: Diagnosis not present

## 2023-06-23 DIAGNOSIS — I129 Hypertensive chronic kidney disease with stage 1 through stage 4 chronic kidney disease, or unspecified chronic kidney disease: Secondary | ICD-10-CM | POA: Diagnosis not present

## 2023-06-23 DIAGNOSIS — E1169 Type 2 diabetes mellitus with other specified complication: Secondary | ICD-10-CM | POA: Diagnosis not present

## 2023-06-25 DIAGNOSIS — Z20822 Contact with and (suspected) exposure to covid-19: Secondary | ICD-10-CM | POA: Diagnosis not present

## 2023-06-25 DIAGNOSIS — R062 Wheezing: Secondary | ICD-10-CM | POA: Diagnosis not present

## 2023-06-25 DIAGNOSIS — Z6835 Body mass index (BMI) 35.0-35.9, adult: Secondary | ICD-10-CM | POA: Diagnosis not present

## 2023-06-25 DIAGNOSIS — J069 Acute upper respiratory infection, unspecified: Secondary | ICD-10-CM | POA: Diagnosis not present

## 2023-06-28 ENCOUNTER — Other Ambulatory Visit: Payer: Self-pay

## 2023-06-28 ENCOUNTER — Emergency Department (HOSPITAL_BASED_OUTPATIENT_CLINIC_OR_DEPARTMENT_OTHER)
Admission: EM | Admit: 2023-06-28 | Discharge: 2023-06-29 | Disposition: A | Discharge status: Home / Self Care | Attending: Emergency Medicine | Admitting: Emergency Medicine

## 2023-06-28 ENCOUNTER — Emergency Department (HOSPITAL_BASED_OUTPATIENT_CLINIC_OR_DEPARTMENT_OTHER)

## 2023-06-28 ENCOUNTER — Encounter (HOSPITAL_BASED_OUTPATIENT_CLINIC_OR_DEPARTMENT_OTHER): Payer: Self-pay | Admitting: Urology

## 2023-06-28 DIAGNOSIS — Z1152 Encounter for screening for COVID-19: Secondary | ICD-10-CM | POA: Diagnosis not present

## 2023-06-28 DIAGNOSIS — E1169 Type 2 diabetes mellitus with other specified complication: Secondary | ICD-10-CM | POA: Diagnosis not present

## 2023-06-28 DIAGNOSIS — R0602 Shortness of breath: Secondary | ICD-10-CM | POA: Diagnosis not present

## 2023-06-28 DIAGNOSIS — E119 Type 2 diabetes mellitus without complications: Secondary | ICD-10-CM | POA: Diagnosis not present

## 2023-06-28 DIAGNOSIS — F32A Depression, unspecified: Secondary | ICD-10-CM | POA: Diagnosis present

## 2023-06-28 DIAGNOSIS — J9811 Atelectasis: Secondary | ICD-10-CM | POA: Diagnosis not present

## 2023-06-28 DIAGNOSIS — Z7985 Long-term (current) use of injectable non-insulin antidiabetic drugs: Secondary | ICD-10-CM | POA: Diagnosis not present

## 2023-06-28 DIAGNOSIS — E782 Mixed hyperlipidemia: Secondary | ICD-10-CM | POA: Diagnosis present

## 2023-06-28 DIAGNOSIS — E669 Obesity, unspecified: Secondary | ICD-10-CM | POA: Diagnosis present

## 2023-06-28 DIAGNOSIS — J181 Lobar pneumonia, unspecified organism: Secondary | ICD-10-CM | POA: Insufficient documentation

## 2023-06-28 DIAGNOSIS — J0110 Acute frontal sinusitis, unspecified: Secondary | ICD-10-CM | POA: Insufficient documentation

## 2023-06-28 DIAGNOSIS — J4520 Mild intermittent asthma, uncomplicated: Secondary | ICD-10-CM | POA: Diagnosis present

## 2023-06-28 DIAGNOSIS — G4733 Obstructive sleep apnea (adult) (pediatric): Secondary | ICD-10-CM | POA: Diagnosis present

## 2023-06-28 DIAGNOSIS — Z4682 Encounter for fitting and adjustment of non-vascular catheter: Secondary | ICD-10-CM | POA: Diagnosis not present

## 2023-06-28 DIAGNOSIS — I2510 Atherosclerotic heart disease of native coronary artery without angina pectoris: Secondary | ICD-10-CM | POA: Diagnosis not present

## 2023-06-28 DIAGNOSIS — Z6835 Body mass index (BMI) 35.0-35.9, adult: Secondary | ICD-10-CM | POA: Diagnosis not present

## 2023-06-28 DIAGNOSIS — Z7984 Long term (current) use of oral hypoglycemic drugs: Secondary | ICD-10-CM | POA: Diagnosis not present

## 2023-06-28 DIAGNOSIS — J45909 Unspecified asthma, uncomplicated: Secondary | ICD-10-CM | POA: Insufficient documentation

## 2023-06-28 DIAGNOSIS — R0600 Dyspnea, unspecified: Secondary | ICD-10-CM | POA: Diagnosis not present

## 2023-06-28 DIAGNOSIS — J869 Pyothorax without fistula: Secondary | ICD-10-CM | POA: Diagnosis present

## 2023-06-28 DIAGNOSIS — Z48813 Encounter for surgical aftercare following surgery on the respiratory system: Secondary | ICD-10-CM | POA: Diagnosis not present

## 2023-06-28 DIAGNOSIS — Z882 Allergy status to sulfonamides status: Secondary | ICD-10-CM | POA: Diagnosis not present

## 2023-06-28 DIAGNOSIS — Z79899 Other long term (current) drug therapy: Secondary | ICD-10-CM | POA: Insufficient documentation

## 2023-06-28 DIAGNOSIS — K760 Fatty (change of) liver, not elsewhere classified: Secondary | ICD-10-CM | POA: Diagnosis present

## 2023-06-28 DIAGNOSIS — R0781 Pleurodynia: Secondary | ICD-10-CM | POA: Diagnosis not present

## 2023-06-28 DIAGNOSIS — J189 Pneumonia, unspecified organism: Secondary | ICD-10-CM | POA: Diagnosis not present

## 2023-06-28 DIAGNOSIS — R071 Chest pain on breathing: Secondary | ICD-10-CM | POA: Diagnosis not present

## 2023-06-28 DIAGNOSIS — D72828 Other elevated white blood cell count: Secondary | ICD-10-CM | POA: Diagnosis present

## 2023-06-28 DIAGNOSIS — R519 Headache, unspecified: Secondary | ICD-10-CM | POA: Diagnosis not present

## 2023-06-28 DIAGNOSIS — J918 Pleural effusion in other conditions classified elsewhere: Secondary | ICD-10-CM | POA: Diagnosis present

## 2023-06-28 DIAGNOSIS — E876 Hypokalemia: Secondary | ICD-10-CM | POA: Diagnosis present

## 2023-06-28 DIAGNOSIS — Z91040 Latex allergy status: Secondary | ICD-10-CM | POA: Insufficient documentation

## 2023-06-28 DIAGNOSIS — D72829 Elevated white blood cell count, unspecified: Secondary | ICD-10-CM | POA: Insufficient documentation

## 2023-06-28 DIAGNOSIS — I700 Atherosclerosis of aorta: Secondary | ICD-10-CM | POA: Diagnosis not present

## 2023-06-28 DIAGNOSIS — J90 Pleural effusion, not elsewhere classified: Secondary | ICD-10-CM | POA: Diagnosis not present

## 2023-06-28 DIAGNOSIS — R058 Other specified cough: Secondary | ICD-10-CM | POA: Diagnosis not present

## 2023-06-28 DIAGNOSIS — E1165 Type 2 diabetes mellitus with hyperglycemia: Secondary | ICD-10-CM | POA: Diagnosis present

## 2023-06-28 DIAGNOSIS — I10 Essential (primary) hypertension: Secondary | ICD-10-CM | POA: Insufficient documentation

## 2023-06-28 DIAGNOSIS — Z886 Allergy status to analgesic agent status: Secondary | ICD-10-CM | POA: Diagnosis not present

## 2023-06-28 DIAGNOSIS — J948 Other specified pleural conditions: Secondary | ICD-10-CM | POA: Diagnosis not present

## 2023-06-28 DIAGNOSIS — Z91030 Bee allergy status: Secondary | ICD-10-CM | POA: Diagnosis not present

## 2023-06-28 DIAGNOSIS — E871 Hypo-osmolality and hyponatremia: Secondary | ICD-10-CM | POA: Diagnosis present

## 2023-06-28 DIAGNOSIS — J15212 Pneumonia due to Methicillin resistant Staphylococcus aureus: Secondary | ICD-10-CM | POA: Diagnosis present

## 2023-06-28 DIAGNOSIS — J329 Chronic sinusitis, unspecified: Secondary | ICD-10-CM | POA: Diagnosis not present

## 2023-06-28 DIAGNOSIS — Z885 Allergy status to narcotic agent status: Secondary | ICD-10-CM | POA: Diagnosis not present

## 2023-06-28 DIAGNOSIS — R918 Other nonspecific abnormal finding of lung field: Secondary | ICD-10-CM | POA: Diagnosis not present

## 2023-06-28 DIAGNOSIS — J9601 Acute respiratory failure with hypoxia: Secondary | ICD-10-CM | POA: Diagnosis present

## 2023-06-28 LAB — URINALYSIS, W/ REFLEX TO CULTURE (INFECTION SUSPECTED)
Bilirubin Urine: NEGATIVE
Glucose, UA: >=500 mg/dL — AB
Hgb urine dipstick: NEGATIVE
Ketones, ur: NEGATIVE mg/dL
Leukocytes,Ua: NEGATIVE
Nitrite: NEGATIVE
Protein, ur: NEGATIVE mg/dL
RBC / HPF: NONE SEEN RBC/hpf (ref 0–5)
Specific Gravity, Urine: 1.015 (ref 1.005–1.030)
pH: 5.5 (ref 5.0–8.0)

## 2023-06-28 LAB — CBC WITH DIFFERENTIAL/PLATELET
Abs Immature Granulocytes: 0.08 10*3/uL — ABNORMAL HIGH (ref 0.00–0.07)
Basophils Absolute: 0 10*3/uL (ref 0.0–0.1)
Basophils Relative: 0 %
Eosinophils Absolute: 0 10*3/uL (ref 0.0–0.5)
Eosinophils Relative: 0 %
HCT: 43.2 % (ref 36.0–46.0)
Hemoglobin: 14.6 g/dL (ref 12.0–15.0)
Immature Granulocytes: 0 %
Lymphocytes Relative: 5 %
Lymphs Abs: 1 10*3/uL (ref 0.7–4.0)
MCH: 30 pg (ref 26.0–34.0)
MCHC: 33.8 g/dL (ref 30.0–36.0)
MCV: 88.7 fL (ref 80.0–100.0)
Monocytes Absolute: 2.1 10*3/uL — ABNORMAL HIGH (ref 0.1–1.0)
Monocytes Relative: 11 %
Neutro Abs: 16.3 10*3/uL — ABNORMAL HIGH (ref 1.7–7.7)
Neutrophils Relative %: 84 %
Platelets: 309 10*3/uL (ref 150–400)
RBC: 4.87 MIL/uL (ref 3.87–5.11)
RDW: 13 % (ref 11.5–15.5)
WBC: 19.6 10*3/uL — ABNORMAL HIGH (ref 4.0–10.5)
nRBC: 0 % (ref 0.0–0.2)

## 2023-06-28 LAB — RESP PANEL BY RT-PCR (RSV, FLU A&B, COVID)  RVPGX2
Influenza A by PCR: NEGATIVE
Influenza B by PCR: NEGATIVE
Resp Syncytial Virus by PCR: NEGATIVE
SARS Coronavirus 2 by RT PCR: NEGATIVE

## 2023-06-28 MED ORDER — SODIUM CHLORIDE 0.9 % IV SOLN
500.0000 mg | Freq: Once | INTRAVENOUS | Status: AC
  Administered 2023-06-29: 500 mg via INTRAVENOUS
  Filled 2023-06-28: qty 5

## 2023-06-28 MED ORDER — SODIUM CHLORIDE 0.9 % IV SOLN
1.0000 g | Freq: Once | INTRAVENOUS | Status: AC
  Administered 2023-06-29: 1 g via INTRAVENOUS
  Filled 2023-06-28: qty 10

## 2023-06-28 MED ORDER — AZITHROMYCIN 250 MG PO TABS: 250.0000 mg | ORAL_TABLET | Freq: Every day | ORAL | 0 refills | Status: DC

## 2023-06-28 MED ORDER — CEPHALEXIN 500 MG PO CAPS: 500.0000 mg | ORAL_CAPSULE | Freq: Three times a day (TID) | ORAL | 0 refills | Status: DC

## 2023-06-28 NOTE — Discharge Instructions (Addendum)
 Begin taking Keflex and Zithromax as prescribed.  Take over-the-counter medications as needed for relief of symptoms.  Return to the ER for shortness of breath, severe chest pain, or for other new and concerning symptoms.

## 2023-06-28 NOTE — ED Triage Notes (Addendum)
 Pt ambulatory to triage Pt states got virus from granddaughter last week  States cough that started 1 week ago  Was seen at pcp wed, was given inhaler  Was feeling better today but tonight started to have fever and body aches   Fever 101 at home  Last tylenol at 1430

## 2023-06-28 NOTE — ED Provider Notes (Signed)
 [facility] [facility] Provider Note   CSN: [id number] Arrival date & time: 06/28/23  1907     History {Add pertinent medical, surgical, social history, OB history to HPI:1} Chief Complaint  Patient presents with   Flu like symptoms     Jade Perez is a 68 y.o. female.  HPI      Cough since last week Saturday, saw Dr on Wednesday and thought it was getting better but today was worse Fever going up after tylenol Achy, chills, fever 101.5 today Headache, chills, across front, and sinuses in front  Granddaughter had it the week before.  Had gotten over the flu, then came home with cough and grandaughter missed school.   Chest hurting with coughing. Nausea, no vomiting Loose bowels, no diarrhea No dysuria Have not noticed sore throat but is scratchy Asthma only when illness comes.  Did see dr on Wednesday and got new inhaler. Has been wheezing at night.  Had low grade fever on Tuesday AM, then had not had one until  Past Medical History:  Diagnosis Date   Allergic rhinitis    Asthma, mild intermittent    followed by pcp    (prevously followed by dr Lucie Leather w/ asthma/ allergy center)   Complication of anesthesia    difficulty waking up   Depression    GAD (generalized anxiety disorder)    GERD (gastroesophageal reflux disease)    Hiatal hernia    History of cardiomyopathy in adulthood 12/06/2021   in setting elevated troponin's w/ NSTEMI;  per echo  12-07-2021  ef 50-55%   History of heart murmur in childhood    since birth very slight   History of kidney stones    History of non-ST elevation myocardial infarction (NSTEMI) 12/06/2021   admission in epic---  elevated troponin's w/ NSTEMI  and stress cardiomyopathy,  12-07-2021 per cath normal coronaries ;  cardiology hospital follow-up with Bernadene Person NP 12-26-2021   Hyperlipidemia, mixed    Hypertension    IDA (iron deficiency anemia)    OA (osteoarthritis)    thumb and  fingers, knee, right hip   OSA (obstructive sleep apnea)    12-27-2021  per pt OSA yrs ago last used cpap approx. 2013,  stated retested this year and is waiting on cpap machine to arrive   Renal disorder    Type 2 diabetes mellitus (HCC) 2008   followed by pcp   (12-27-2021  pt sated checks blood sugar am daily fasting, average 155-170)   Wears glasses      Home Medications Prior to Admission medications   Medication Sig Start Date End Date Taking? Authorizing Provider  acetaminophen (TYLENOL) 500 MG tablet Take 500 mg by mouth every 6 (six) hours as needed for mild pain.    [provider]  albuterol (PROAIR HFA) 108 (90 Base) MCG/ACT inhaler Can inhale two puffs every four to six hours as needed for cough or wheeze. Patient not taking: Reported on 09/03/2022 06/08/20   Jessica Priest, MD  amLODipine (NORVASC) 5 MG tablet Take 1 tablet (5 mg total) by mouth daily. Patient taking differently: Take 5 mg by mouth daily. 12/16/21   Regalado, Belkys A, MD  atorvastatin (LIPITOR) 40 MG tablet Take 40 mg by mouth at bedtime.    [provider]  b complex vitamins tablet Take 1 tablet by mouth at bedtime.    [provider]  cetirizine (ZYRTEC) 10 MG tablet Take 10 mg by  mouth daily.    [provider]  citalopram (CELEXA) 40 MG tablet Take 20 mg by mouth daily.    [provider]  fenofibrate (TRICOR) 145 MG tablet Take 145 mg by mouth at bedtime. 09/22/19   [provider]  Ferrous Sulfate (SLOW FE PO) Take 1 tablet by mouth daily.    [provider]  glimepiride (AMARYL) 1 MG tablet Take 0.5 mg by mouth 2 (two) times daily. 02/15/19   [provider]  montelukast (SINGULAIR) 10 MG tablet TAKE 1 TABLET BY MOUTH EVERY DAY Patient taking differently: Take 10 mg by mouth at bedtime. 07/19/19   Kozlow, Alvira Philips, MD  omeprazole (PRILOSEC) 40 MG capsule TAKE 1 CAPSULE BY MOUTH EVERY DAY IN THE MORNING BEFORE BREAKFAST Patient taking  differently: Take 40 mg by mouth daily. 07/30/21   Kozlow, Alvira Philips, MD  OZEMPIC, 1 MG/DOSE, 4 MG/3ML SOPN Inject 2 mg into the skin once a week. Wednesday's 05/25/20   [provider]  triamcinolone (NASACORT) 55 MCG/ACT AERO nasal inhaler Place 1 spray into the nose daily as needed (allergies). Patient not taking: Reported on 09/03/2022    [provider]  Vitamin D, Ergocalciferol, (DRISDOL) 50000 UNITS CAPS capsule Take 50,000 Units by mouth every 30 (thirty) days.     [provider]      Allergies    Amoxicillin, Aspirin, Bee venom, Clarithromycin, Erythromycin, Ibuprofen, Oxycodone, Sulfa antibiotics, Latex, and Tape    Review of Systems   Review of Systems  Physical Exam Updated Vital Signs BP 127/70   Pulse 91   Temp 99.3 F (37.4 C)   Resp 16   Ht 5\' 2"  (1.575 m)   Wt 89.8 kg   SpO2 97%   BMI 36.21 kg/m  Physical Exam  ED Results / Procedures / Treatments   Labs (all labs ordered are listed, but only abnormal results are displayed) Labs Reviewed  RESP PANEL BY RT-PCR (RSV, FLU A&B, COVID)  [id number]    EKG None  Radiology No results found.  Procedures Procedures  {Document cardiac monitor, telemetry assessment procedure when appropriate:1}  Medications Ordered in ED Medications - No data to display  ED Course/ Medical Decision Making/ A&P   {   Click here for ABCD2, HEART and other calculatorsREFRESH Note before signing :1}                              Medical Decision Making  ***  {Document critical care time when appropriate:1} {Document review of labs and clinical decision tools ie heart score, Chads2Vasc2 etc:1}  {Document your independent review of radiology images, and any outside records:1} {Document your discussion with family members, caretakers, and with consultants:1} {Document social determinants of health affecting pt's care:1} {Document your decision making why or why not admission, treatments were  needed:1} Final Clinical Impression(s) / ED Diagnoses Final diagnoses:  None    Rx / DC Orders ED Discharge Orders     None

## 2023-06-29 LAB — COMPREHENSIVE METABOLIC PANEL
ALT: 34 U/L (ref 0–44)
AST: 31 U/L (ref 15–41)
Albumin: 4.3 g/dL (ref 3.5–5.0)
Alkaline Phosphatase: 41 U/L (ref 38–126)
Anion gap: 14 (ref 5–15)
BUN: 20 mg/dL (ref 8–23)
CO2: 23 mmol/L (ref 22–32)
Calcium: 9.7 mg/dL (ref 8.9–10.3)
Chloride: 100 mmol/L (ref 98–111)
Creatinine, Ser: 1.08 mg/dL — ABNORMAL HIGH (ref 0.44–1.00)
GFR, Estimated: 56 mL/min — ABNORMAL LOW (ref >60)
Glucose, Bld: 190 mg/dL — ABNORMAL HIGH (ref 70–99)
Potassium: 3.7 mmol/L (ref 3.5–5.1)
Sodium: 137 mmol/L (ref 135–145)
Total Bilirubin: 0.9 mg/dL (ref 0.0–1.2)
Total Protein: 8.1 g/dL (ref 6.5–8.1)

## 2023-06-29 LAB — TROPONIN I (HIGH SENSITIVITY): Troponin I (High Sensitivity): 9 ng/L (ref <18)

## 2023-06-29 LAB — CBG MONITORING, ED: Glucose-Capillary: 204 mg/dL — ABNORMAL HIGH (ref 70–99)

## 2023-06-29 MED ORDER — AZITHROMYCIN 250 MG PO TABS: 250.0000 mg | ORAL_TABLET | Freq: Every day | ORAL | 0 refills | Status: DC

## 2023-06-29 MED ORDER — ONDANSETRON HCL 4 MG/2ML IJ SOLN
4.0000 mg | Freq: Once | INTRAMUSCULAR | Status: AC
  Administered 2023-06-29: 4 mg via INTRAVENOUS
  Filled 2023-06-29: qty 2

## 2023-06-29 MED ORDER — SODIUM CHLORIDE 0.9 % IV SOLN
INTRAVENOUS | Status: DC | PRN
  Administered 2023-06-29: 250 mL via INTRAVENOUS

## 2023-06-29 MED ORDER — CEPHALEXIN 500 MG PO CAPS: 500.0000 mg | ORAL_CAPSULE | Freq: Three times a day (TID) | ORAL | 0 refills | Status: DC

## 2023-06-29 NOTE — ED Provider Notes (Signed)
  Physical Exam  BP (!) 117/58   Pulse 76   Temp 98.2 F (36.8 C) (Oral)   Resp (!) 25   Ht 5\' 2"  (1.575 m)   Wt 89.8 kg   SpO2 93%   BMI 36.21 kg/m   Physical Exam Vitals and nursing note reviewed.  Constitutional:      Appearance: Normal appearance.  Pulmonary:     Effort: Pulmonary effort is normal.  Skin:    General: Skin is warm and dry.  Neurological:     Mental Status: She is alert and oriented to person, place, and time.     Procedures  Procedures  ED Course / MDM    Medical Decision Making Amount and/or Complexity of Data Reviewed Labs: ordered. Radiology: ordered.  Risk Prescription drug management.   Care assumed from Dr. Dalene Seltzer at shift change.  Patient presenting here with cough.  Workup reveals a white count of nearly 20,000 and chest x-ray showing a developing infiltrate.  Care signed out to me awaiting results of a CMP and troponin, both of these have returned and are basically unremarkable.  Patient has received IV ceftriaxone and Zithromax without complication.  She will be discharged with antibiotics and as needed return.       Geoffery Lyons, MD 06/29/23 [phone]

## 2023-06-29 NOTE — ED Notes (Signed)
 Pt felt blood sugar was low. Requested for it to be checked. Complete POC CBG.  Made EDP aware.

## 2023-06-30 ENCOUNTER — Inpatient Hospital Stay (HOSPITAL_BASED_OUTPATIENT_CLINIC_OR_DEPARTMENT_OTHER)
Admission: EM | Admit: 2023-06-30 | Discharge: 2023-07-12 | DRG: 177 | Disposition: A | Discharge status: Home Health | Attending: Internal Medicine | Admitting: Internal Medicine

## 2023-06-30 ENCOUNTER — Other Ambulatory Visit: Payer: Self-pay

## 2023-06-30 ENCOUNTER — Emergency Department (HOSPITAL_BASED_OUTPATIENT_CLINIC_OR_DEPARTMENT_OTHER)

## 2023-06-30 ENCOUNTER — Encounter (HOSPITAL_BASED_OUTPATIENT_CLINIC_OR_DEPARTMENT_OTHER): Payer: Self-pay | Admitting: Emergency Medicine

## 2023-06-30 DIAGNOSIS — D72828 Other elevated white blood cell count: Secondary | ICD-10-CM | POA: Diagnosis present

## 2023-06-30 DIAGNOSIS — Z7985 Long-term (current) use of injectable non-insulin antidiabetic drugs: Secondary | ICD-10-CM | POA: Diagnosis not present

## 2023-06-30 DIAGNOSIS — Z91030 Bee allergy status: Secondary | ICD-10-CM

## 2023-06-30 DIAGNOSIS — J918 Pleural effusion in other conditions classified elsewhere: Secondary | ICD-10-CM | POA: Diagnosis present

## 2023-06-30 DIAGNOSIS — R0600 Dyspnea, unspecified: Secondary | ICD-10-CM | POA: Diagnosis not present

## 2023-06-30 DIAGNOSIS — I252 Old myocardial infarction: Secondary | ICD-10-CM

## 2023-06-30 DIAGNOSIS — Z87442 Personal history of urinary calculi: Secondary | ICD-10-CM

## 2023-06-30 DIAGNOSIS — J4520 Mild intermittent asthma, uncomplicated: Secondary | ICD-10-CM | POA: Diagnosis present

## 2023-06-30 DIAGNOSIS — R058 Other specified cough: Secondary | ICD-10-CM | POA: Diagnosis not present

## 2023-06-30 DIAGNOSIS — R918 Other nonspecific abnormal finding of lung field: Secondary | ICD-10-CM | POA: Diagnosis not present

## 2023-06-30 DIAGNOSIS — Z5948 Other specified lack of adequate food: Secondary | ICD-10-CM

## 2023-06-30 DIAGNOSIS — J9601 Acute respiratory failure with hypoxia: Secondary | ICD-10-CM | POA: Diagnosis present

## 2023-06-30 DIAGNOSIS — R071 Chest pain on breathing: Secondary | ICD-10-CM | POA: Diagnosis not present

## 2023-06-30 DIAGNOSIS — J869 Pyothorax without fistula: Secondary | ICD-10-CM | POA: Diagnosis present

## 2023-06-30 DIAGNOSIS — J45909 Unspecified asthma, uncomplicated: Secondary | ICD-10-CM | POA: Diagnosis not present

## 2023-06-30 DIAGNOSIS — Z5941 Food insecurity: Secondary | ICD-10-CM

## 2023-06-30 DIAGNOSIS — E669 Obesity, unspecified: Secondary | ICD-10-CM | POA: Diagnosis present

## 2023-06-30 DIAGNOSIS — Z91048 Other nonmedicinal substance allergy status: Secondary | ICD-10-CM

## 2023-06-30 DIAGNOSIS — E1165 Type 2 diabetes mellitus with hyperglycemia: Secondary | ICD-10-CM | POA: Diagnosis present

## 2023-06-30 DIAGNOSIS — Z882 Allergy status to sulfonamides status: Secondary | ICD-10-CM | POA: Diagnosis not present

## 2023-06-30 DIAGNOSIS — J0110 Acute frontal sinusitis, unspecified: Secondary | ICD-10-CM | POA: Diagnosis present

## 2023-06-30 DIAGNOSIS — Z880 Allergy status to penicillin: Secondary | ICD-10-CM

## 2023-06-30 DIAGNOSIS — Z881 Allergy status to other antibiotic agents status: Secondary | ICD-10-CM

## 2023-06-30 DIAGNOSIS — Z91040 Latex allergy status: Secondary | ICD-10-CM | POA: Diagnosis not present

## 2023-06-30 DIAGNOSIS — Z7984 Long term (current) use of oral hypoglycemic drugs: Secondary | ICD-10-CM | POA: Diagnosis not present

## 2023-06-30 DIAGNOSIS — R0602 Shortness of breath: Secondary | ICD-10-CM | POA: Diagnosis not present

## 2023-06-30 DIAGNOSIS — Z1152 Encounter for screening for COVID-19: Secondary | ICD-10-CM

## 2023-06-30 DIAGNOSIS — E782 Mixed hyperlipidemia: Secondary | ICD-10-CM | POA: Diagnosis present

## 2023-06-30 DIAGNOSIS — E1169 Type 2 diabetes mellitus with other specified complication: Secondary | ICD-10-CM | POA: Diagnosis not present

## 2023-06-30 DIAGNOSIS — Z800 Family history of malignant neoplasm of digestive organs: Secondary | ICD-10-CM

## 2023-06-30 DIAGNOSIS — J189 Pneumonia, unspecified organism: Principal | ICD-10-CM | POA: Diagnosis present

## 2023-06-30 DIAGNOSIS — I10 Essential (primary) hypertension: Secondary | ICD-10-CM | POA: Diagnosis present

## 2023-06-30 DIAGNOSIS — E119 Type 2 diabetes mellitus without complications: Secondary | ICD-10-CM

## 2023-06-30 DIAGNOSIS — R0781 Pleurodynia: Secondary | ICD-10-CM | POA: Diagnosis not present

## 2023-06-30 DIAGNOSIS — Z885 Allergy status to narcotic agent status: Secondary | ICD-10-CM | POA: Diagnosis not present

## 2023-06-30 DIAGNOSIS — E871 Hypo-osmolality and hyponatremia: Secondary | ICD-10-CM | POA: Diagnosis present

## 2023-06-30 DIAGNOSIS — F32A Depression, unspecified: Secondary | ICD-10-CM | POA: Diagnosis present

## 2023-06-30 DIAGNOSIS — J90 Pleural effusion, not elsewhere classified: Secondary | ICD-10-CM

## 2023-06-30 DIAGNOSIS — E876 Hypokalemia: Secondary | ICD-10-CM | POA: Diagnosis present

## 2023-06-30 DIAGNOSIS — Z888 Allergy status to other drugs, medicaments and biological substances status: Secondary | ICD-10-CM

## 2023-06-30 DIAGNOSIS — J15212 Pneumonia due to Methicillin resistant Staphylococcus aureus: Principal | ICD-10-CM | POA: Diagnosis present

## 2023-06-30 DIAGNOSIS — J9811 Atelectasis: Secondary | ICD-10-CM | POA: Diagnosis not present

## 2023-06-30 DIAGNOSIS — K760 Fatty (change of) liver, not elsewhere classified: Secondary | ICD-10-CM | POA: Diagnosis present

## 2023-06-30 DIAGNOSIS — I2510 Atherosclerotic heart disease of native coronary artery without angina pectoris: Secondary | ICD-10-CM | POA: Diagnosis not present

## 2023-06-30 DIAGNOSIS — K449 Diaphragmatic hernia without obstruction or gangrene: Secondary | ICD-10-CM | POA: Diagnosis present

## 2023-06-30 DIAGNOSIS — Z6835 Body mass index (BMI) 35.0-35.9, adult: Secondary | ICD-10-CM | POA: Diagnosis not present

## 2023-06-30 DIAGNOSIS — Z79899 Other long term (current) drug therapy: Secondary | ICD-10-CM

## 2023-06-30 DIAGNOSIS — Z48813 Encounter for surgical aftercare following surgery on the respiratory system: Secondary | ICD-10-CM | POA: Diagnosis not present

## 2023-06-30 DIAGNOSIS — Z886 Allergy status to analgesic agent status: Secondary | ICD-10-CM

## 2023-06-30 DIAGNOSIS — F411 Generalized anxiety disorder: Secondary | ICD-10-CM | POA: Diagnosis present

## 2023-06-30 DIAGNOSIS — G4733 Obstructive sleep apnea (adult) (pediatric): Secondary | ICD-10-CM | POA: Diagnosis present

## 2023-06-30 DIAGNOSIS — Z6836 Body mass index (BMI) 36.0-36.9, adult: Secondary | ICD-10-CM

## 2023-06-30 DIAGNOSIS — I700 Atherosclerosis of aorta: Secondary | ICD-10-CM | POA: Diagnosis not present

## 2023-06-30 DIAGNOSIS — Z4682 Encounter for fitting and adjustment of non-vascular catheter: Secondary | ICD-10-CM | POA: Diagnosis not present

## 2023-06-30 LAB — BASIC METABOLIC PANEL
Anion gap: 12 (ref 5–15)
Anion gap: 13 (ref 5–15)
BUN: 19 mg/dL (ref 8–23)
BUN: 17 mg/dL (ref 8–23)
CO2: 22 mmol/L (ref 22–32)
CO2: 20 mmol/L — ABNORMAL LOW (ref 22–32)
Calcium: 9.3 mg/dL (ref 8.9–10.3)
Calcium: 9.2 mg/dL (ref 8.9–10.3)
Chloride: 99 mmol/L (ref 98–111)
Chloride: 101 mmol/L (ref 98–111)
Creatinine, Ser: 1.02 mg/dL — ABNORMAL HIGH (ref 0.44–1.00)
Creatinine, Ser: 0.72 mg/dL (ref 0.44–1.00)
GFR, Estimated: 60 mL/min — ABNORMAL LOW (ref >60)
GFR, Estimated: >60 mL/min (ref >60)
Glucose, Bld: 211 mg/dL — ABNORMAL HIGH (ref 70–99)
Glucose, Bld: 159 mg/dL — ABNORMAL HIGH (ref 70–99)
Potassium: 2.9 mmol/L — ABNORMAL LOW (ref 3.5–5.1)
Potassium: 3.3 mmol/L — ABNORMAL LOW (ref 3.5–5.1)
Sodium: 133 mmol/L — ABNORMAL LOW (ref 135–145)
Sodium: 134 mmol/L — ABNORMAL LOW (ref 135–145)

## 2023-06-30 LAB — CBC WITH DIFFERENTIAL/PLATELET
Abs Immature Granulocytes: 0.06 10*3/uL (ref 0.00–0.07)
Basophils Absolute: 0 10*3/uL (ref 0.0–0.1)
Basophils Relative: 0 %
Eosinophils Absolute: 0 10*3/uL (ref 0.0–0.5)
Eosinophils Relative: 0 %
HCT: 37.6 % (ref 36.0–46.0)
Hemoglobin: 13 g/dL (ref 12.0–15.0)
Immature Granulocytes: 0 %
Lymphocytes Relative: 7 %
Lymphs Abs: 1.3 10*3/uL (ref 0.7–4.0)
MCH: 31 pg (ref 26.0–34.0)
MCHC: 34.6 g/dL (ref 30.0–36.0)
MCV: 89.5 fL (ref 80.0–100.0)
Monocytes Absolute: 1.7 10*3/uL — ABNORMAL HIGH (ref 0.1–1.0)
Monocytes Relative: 8 %
Neutro Abs: 16.7 10*3/uL — ABNORMAL HIGH (ref 1.7–7.7)
Neutrophils Relative %: 85 %
Platelets: 266 10*3/uL (ref 150–400)
RBC: 4.2 MIL/uL (ref 3.87–5.11)
RDW: 13.3 % (ref 11.5–15.5)
WBC: 20.1 10*3/uL — ABNORMAL HIGH (ref 4.0–10.5)
nRBC: 0 % (ref 0.0–0.2)

## 2023-06-30 LAB — GLUCOSE, CAPILLARY
Glucose-Capillary: 183 mg/dL — ABNORMAL HIGH (ref 70–99)
Glucose-Capillary: 170 mg/dL — ABNORMAL HIGH (ref 70–99)
Glucose-Capillary: 189 mg/dL — ABNORMAL HIGH (ref 70–99)
Glucose-Capillary: 147 mg/dL — ABNORMAL HIGH (ref 70–99)

## 2023-06-30 LAB — STREP PNEUMONIAE URINARY ANTIGEN: Strep Pneumo Urinary Antigen: NEGATIVE

## 2023-06-30 LAB — PHOSPHORUS: Phosphorus: 1.9 mg/dL — ABNORMAL LOW (ref 2.5–4.6)

## 2023-06-30 LAB — MAGNESIUM: Magnesium: 1.6 mg/dL — ABNORMAL LOW (ref 1.7–2.4)

## 2023-06-30 MED ORDER — HYDROCODONE-ACETAMINOPHEN 5-325 MG PO TABS
1.0000 | ORAL_TABLET | ORAL | Status: DC | PRN
  Administered 2023-06-30 – 2023-07-12 (×18): 1 via ORAL
  Filled 2023-06-30 (×20): qty 1

## 2023-06-30 MED ORDER — ALBUTEROL SULFATE (2.5 MG/3ML) 0.083% IN NEBU: 2.5000 mg | INHALATION_SOLUTION | Freq: Four times a day (QID) | RESPIRATORY_TRACT | Status: DC | PRN

## 2023-06-30 MED ORDER — FENTANYL CITRATE PF 50 MCG/ML IJ SOSY: 12.5000 ug | PREFILLED_SYRINGE | INTRAMUSCULAR | Status: DC | PRN

## 2023-06-30 MED ORDER — GLIMEPIRIDE 1 MG PO TABS
0.5000 mg | ORAL_TABLET | Freq: Two times a day (BID) | ORAL | Status: DC
Start: 2023-06-30 — End: 2023-07-12
  Administered 2023-06-30 – 2023-07-12 (×24): 0.5 mg via ORAL
  Filled 2023-06-30 (×21): qty 1

## 2023-06-30 MED ORDER — METOPROLOL TARTRATE 5 MG/5ML IV SOLN: 5.0000 mg | INTRAVENOUS | Status: DC | PRN

## 2023-06-30 MED ORDER — GUAIFENESIN 100 MG/5ML PO LIQD: 5.0000 mL | ORAL | Status: DC | PRN

## 2023-06-30 MED ORDER — ENOXAPARIN SODIUM 40 MG/0.4ML IJ SOSY
40.0000 mg | PREFILLED_SYRINGE | INTRAMUSCULAR | Status: DC
  Administered 2023-06-30 – 2023-07-09 (×10): 40 mg via SUBCUTANEOUS
  Filled 2023-06-30 (×10): qty 0.4

## 2023-06-30 MED ORDER — SODIUM CHLORIDE 0.9 % IV SOLN
500.0000 mg | Freq: Once | INTRAVENOUS | Status: AC
  Administered 2023-06-30: 500 mg via INTRAVENOUS
  Filled 2023-06-30: qty 5

## 2023-06-30 MED ORDER — PANTOPRAZOLE SODIUM 40 MG PO TBEC
40.0000 mg | DELAYED_RELEASE_TABLET | Freq: Every day | ORAL | Status: DC
  Administered 2023-06-30 – 2023-07-12 (×13): 40 mg via ORAL
  Filled 2023-06-30 (×13): qty 1

## 2023-06-30 MED ORDER — ATORVASTATIN CALCIUM 40 MG PO TABS
40.0000 mg | ORAL_TABLET | Freq: Every day | ORAL | Status: DC
Start: 2023-06-30 — End: 2023-07-12
  Administered 2023-06-30 – 2023-07-11 (×12): 40 mg via ORAL
  Filled 2023-06-30 (×12): qty 1

## 2023-06-30 MED ORDER — FENTANYL CITRATE PF 50 MCG/ML IJ SOSY
50.0000 ug | PREFILLED_SYRINGE | Freq: Once | INTRAMUSCULAR | Status: AC
  Administered 2023-06-30: 50 ug via INTRAVENOUS
  Filled 2023-06-30: qty 1

## 2023-06-30 MED ORDER — ACETAMINOPHEN 650 MG RE SUPP: 650.0000 mg | Freq: Four times a day (QID) | RECTAL | Status: DC | PRN

## 2023-06-30 MED ORDER — POTASSIUM CHLORIDE 20 MEQ PO PACK
20.0000 meq | PACK | Freq: Once | ORAL | Status: AC
  Administered 2023-06-30: 20 meq via ORAL
  Filled 2023-06-30: qty 1

## 2023-06-30 MED ORDER — FENOFIBRATE 160 MG PO TABS
160.0000 mg | ORAL_TABLET | Freq: Every day | ORAL | Status: DC
  Administered 2023-06-30 – 2023-07-12 (×13): 160 mg via ORAL
  Filled 2023-06-30 (×13): qty 1

## 2023-06-30 MED ORDER — AMLODIPINE BESYLATE 5 MG PO TABS
5.0000 mg | ORAL_TABLET | Freq: Every day | ORAL | Status: DC
  Administered 2023-06-30 – 2023-07-12 (×13): 5 mg via ORAL
  Filled 2023-06-30 (×13): qty 1

## 2023-06-30 MED ORDER — HYDRALAZINE HCL 20 MG/ML IJ SOLN: 10.0000 mg | INTRAMUSCULAR | Status: DC | PRN

## 2023-06-30 MED ORDER — MAGNESIUM SULFATE 4 GM/100ML IV SOLN
4.0000 g | Freq: Once | INTRAVENOUS | Status: AC
  Administered 2023-06-30: 4 g via INTRAVENOUS
  Filled 2023-06-30: qty 100

## 2023-06-30 MED ORDER — INSULIN ASPART 100 UNIT/ML IJ SOLN
0.0000 [IU] | Freq: Three times a day (TID) | INTRAMUSCULAR | Status: DC
  Administered 2023-06-30 – 2023-07-01 (×4): 1 [IU] via SUBCUTANEOUS
  Administered 2023-07-02: 2 [IU] via SUBCUTANEOUS
  Administered 2023-07-02 – 2023-07-03 (×3): 1 [IU] via SUBCUTANEOUS
  Administered 2023-07-03: 2 [IU] via SUBCUTANEOUS
  Administered 2023-07-04: 1 [IU] via SUBCUTANEOUS
  Administered 2023-07-04: 2 [IU] via SUBCUTANEOUS
  Administered 2023-07-05: 1 [IU] via SUBCUTANEOUS
  Administered 2023-07-05 – 2023-07-06 (×3): 2 [IU] via SUBCUTANEOUS
  Administered 2023-07-06 – 2023-07-07 (×2): 3 [IU] via SUBCUTANEOUS
  Administered 2023-07-07: 2 [IU] via SUBCUTANEOUS
  Administered 2023-07-08: 1 [IU] via SUBCUTANEOUS
  Administered 2023-07-08: 2 [IU] via SUBCUTANEOUS
  Administered 2023-07-08 – 2023-07-11 (×4): 3 [IU] via SUBCUTANEOUS
  Administered 2023-07-11 – 2023-07-12 (×2): 1 [IU] via SUBCUTANEOUS

## 2023-06-30 MED ORDER — ONDANSETRON HCL 4 MG/2ML IJ SOLN
4.0000 mg | Freq: Four times a day (QID) | INTRAMUSCULAR | Status: DC | PRN
  Administered 2023-07-03 – 2023-07-10 (×5): 4 mg via INTRAVENOUS
  Filled 2023-06-30 (×5): qty 2

## 2023-06-30 MED ORDER — SODIUM CHLORIDE 0.9% FLUSH
3.0000 mL | Freq: Two times a day (BID) | INTRAVENOUS | Status: DC
  Administered 2023-06-30 – 2023-07-12 (×24): 3 mL via INTRAVENOUS

## 2023-06-30 MED ORDER — ACETAMINOPHEN 325 MG PO TABS
650.0000 mg | ORAL_TABLET | Freq: Four times a day (QID) | ORAL | Status: DC | PRN
  Administered 2023-07-01 – 2023-07-12 (×18): 650 mg via ORAL
  Filled 2023-06-30 (×21): qty 2

## 2023-06-30 MED ORDER — MONTELUKAST SODIUM 10 MG PO TABS
10.0000 mg | ORAL_TABLET | Freq: Every day | ORAL | Status: DC
  Administered 2023-06-30 – 2023-07-11 (×12): 10 mg via ORAL
  Filled 2023-06-30 (×12): qty 1

## 2023-06-30 MED ORDER — CYCLOBENZAPRINE HCL 5 MG PO TABS
5.0000 mg | ORAL_TABLET | Freq: Three times a day (TID) | ORAL | Status: DC | PRN
  Administered 2023-06-30 – 2023-07-11 (×23): 5 mg via ORAL
  Filled 2023-06-30 (×27): qty 1

## 2023-06-30 MED ORDER — POTASSIUM CHLORIDE CRYS ER 20 MEQ PO TBCR
40.0000 meq | EXTENDED_RELEASE_TABLET | Freq: Once | ORAL | Status: AC
  Administered 2023-06-30: 40 meq via ORAL
  Filled 2023-06-30: qty 2

## 2023-06-30 MED ORDER — SODIUM CHLORIDE 0.9 % IV SOLN
2.0000 g | INTRAVENOUS | Status: AC
  Administered 2023-07-01 – 2023-07-04 (×4): 2 g via INTRAVENOUS
  Filled 2023-06-30 (×4): qty 20

## 2023-06-30 MED ORDER — GUAIFENESIN 100 MG/5ML PO LIQD
5.0000 mL | ORAL | Status: DC | PRN
  Administered 2023-07-05: 5 mL via ORAL
  Filled 2023-06-30: qty 10

## 2023-06-30 MED ORDER — IOHEXOL 350 MG/ML SOLN
75.0000 mL | Freq: Once | INTRAVENOUS | Status: AC | PRN
  Administered 2023-06-30: 75 mL via INTRAVENOUS

## 2023-06-30 MED ORDER — TRAZODONE HCL 50 MG PO TABS: 50.0000 mg | ORAL_TABLET | Freq: Every evening | ORAL | Status: DC | PRN

## 2023-06-30 MED ORDER — FERROUS SULFATE 325 (65 FE) MG PO TABS
325.0000 mg | ORAL_TABLET | Freq: Every day | ORAL | Status: DC
  Administered 2023-06-30 – 2023-07-12 (×13): 325 mg via ORAL
  Filled 2023-06-30 (×12): qty 1

## 2023-06-30 MED ORDER — INSULIN ASPART 100 UNIT/ML IJ SOLN
0.0000 [IU] | Freq: Every day | INTRAMUSCULAR | Status: DC
  Administered 2023-07-01: 1 [IU] via SUBCUTANEOUS
  Administered 2023-07-03: 3 [IU] via SUBCUTANEOUS
  Administered 2023-07-06: 2 [IU] via SUBCUTANEOUS
  Administered 2023-07-07: 3 [IU] via SUBCUTANEOUS
  Administered 2023-07-08 – 2023-07-11 (×3): 2 [IU] via SUBCUTANEOUS

## 2023-06-30 MED ORDER — POTASSIUM PHOSPHATES 15 MMOLE/5ML IV SOLN
30.0000 mmol | Freq: Once | INTRAVENOUS | Status: AC
  Administered 2023-06-30: 30 mmol via INTRAVENOUS
  Filled 2023-06-30: qty 10

## 2023-06-30 MED ORDER — AZITHROMYCIN 250 MG PO TABS
500.0000 mg | ORAL_TABLET | Freq: Every day | ORAL | Status: AC
  Administered 2023-07-01 – 2023-07-04 (×4): 500 mg via ORAL
  Filled 2023-06-30 (×4): qty 2

## 2023-06-30 MED ORDER — ONDANSETRON HCL 4 MG/2ML IJ SOLN
4.0000 mg | Freq: Once | INTRAMUSCULAR | Status: AC
  Administered 2023-06-30: 4 mg via INTRAVENOUS
  Filled 2023-06-30: qty 2

## 2023-06-30 MED ORDER — IPRATROPIUM-ALBUTEROL 0.5-2.5 (3) MG/3ML IN SOLN: 3.0000 mL | RESPIRATORY_TRACT | Status: DC | PRN

## 2023-06-30 MED ORDER — ONDANSETRON HCL 4 MG PO TABS: 4.0000 mg | ORAL_TABLET | Freq: Four times a day (QID) | ORAL | Status: DC | PRN

## 2023-06-30 MED ORDER — SODIUM CHLORIDE 0.9 % IV SOLN
1.0000 g | Freq: Once | INTRAVENOUS | Status: AC
  Administered 2023-06-30: 1 g via INTRAVENOUS
  Filled 2023-06-30: qty 10

## 2023-06-30 MED ORDER — POLYETHYLENE GLYCOL 3350 17 G PO PACK: 17.0000 g | PACK | Freq: Every day | ORAL | Status: DC | PRN

## 2023-06-30 MED ORDER — BISACODYL 5 MG PO TBEC
5.0000 mg | DELAYED_RELEASE_TABLET | Freq: Every day | ORAL | Status: DC | PRN
  Administered 2023-07-11: 5 mg via ORAL
  Filled 2023-06-30: qty 1

## 2023-06-30 MED ORDER — CITALOPRAM HYDROBROMIDE 20 MG PO TABS
20.0000 mg | ORAL_TABLET | Freq: Every day | ORAL | Status: DC
  Administered 2023-06-30 – 2023-07-12 (×13): 20 mg via ORAL
  Filled 2023-06-30 (×13): qty 1

## 2023-06-30 NOTE — ED Triage Notes (Signed)
 Pt c/o increased pain to left lung and ribs with worsened SOB after being diagnosed yesterday with PNA. O2 saturation 94% on room air but pt states lung pain is worse with deep inhalation and movement.

## 2023-06-30 NOTE — Progress Notes (Signed)
 Hospitalist Transfer Note:    Nursing staff, Please call [facility] Admits & Consults System-Wide number on Amion [phone]) as soon as patient's arrival, so appropriate admitting provider can evaluate the pt.   Transferring facility: [facility] Requesting provider: Dr. Nicanor Alcon (EDP at [facility]) Reason for transfer: admission for further evaluation and management of CAP complicated by acute hypoxic respiratory distress.   68  year old female who presented to [facility] ED complaining of shortness of breath.   The patient was diagnosed with influenza A last week, at which time she was experiencing cough and fever.  Over the last 4 to 5 days, she has been experiencing worsening cough, worsening fever, as well as development of shortness of breath, which prompted her to present to the emergency department approximately 36 hours ago, at which time there was suspicion for pneumonia.  She received a dose of IV antibiotics in the ED, and was discharged to home on oral antibiotics.    However, she notes progression in the above symptoms over the last 1.5 days, prompting her to present back to the emergency department this evening for further evaluation and management thereof.  She notes she has been experiencing persistent objective fevers at home in spite of recurrent use of prn acetaminophen.  She notes a history of allergy to NSAIDs and therefore conveys that her antipyretic intervention at home has been limited to acetaminophen.  She denies any known baseline supplemental oxygen requirements.  Vital signs in the ED were notable for the following: Temperature max 99.1; initial oxygen saturation 88% on room air, subs improving into the mid 90s on 2 L nasal cannula.  Labs were notable for white blood cell count of 20,100.  Imaging notable for chest x-ray which shows interval worsening of left-sided pneumonia.  CTA chest has been ordered, with result currently pending.  Blood cultures x 2 were collected and she was  started on azithromycin and Rocephin for suspected community-acquired pneumonia.   Subsequently, I accepted this patient for transfer for inpatient admission to a med/tele bed at [facility] or [facility] (first available) for further work-up and management of the above.       Newton Pigg, DO Hospitalist

## 2023-06-30 NOTE — H&P (Signed)
 History and Physical    Jade Perez ZOX:[id number] DOB: Dec 12, 1955 DOA: 06/30/2023  PCP: Abner Greenspan, MD   Patient coming from: Home   Chief Complaint: SOB, cough, pleuritic pain   HPI: Jade Perez is a  68 y.o. female with medical history significant for hypertension, hyperlipidemia, type 2 diabetes mellitus, OSA, depression, asthma, and NSTEMI with normal coronaries felt to be stress-induced now presents with cough, worsening shortness of breath, and pleuritic pain.  Patient was seen in the ED for these symptoms on 06/28/2023, diagnosed with left lower lobe pneumonia, and discharged home with antibiotics.  She now returns with worsening SOB and worsening pleuritic pain. Her cough has been productive, initially with white sputum but now a reddish brown. Pain is in her upper back on the left, left flank, and left shoulder when she takes a deep breath or coughs.   [facility] ED Course: Upon arrival to the ED, patient is found to be afebrile and saturating upper 80s to low 90s on room air with mild tachypnea, normal HR, and stable blood pressure.  Labs are most notable for potassium 2.9, creatinine 1.02, and WBC 20,100.  Chest x-ray reveals increasing left lower lobe infiltrate.  CTA chest is negative for PE but again reveals left lower lobe consolidation.   Blood cultures were collected and patient was treated with Rocephin, azithromycin, Zofran, fentanyl, and oral potassium.  She was transferred to [facility] for admission.  Review of Systems:  All other systems reviewed and apart from HPI, are negative.  Past Medical History:  Diagnosis Date   Allergic rhinitis    Asthma, mild intermittent    followed by pcp    (prevously followed by dr Lucie Leather w/ asthma/ allergy center)   Complication of anesthesia    difficulty waking up   Depression    GAD (generalized anxiety disorder)    GERD (gastroesophageal reflux disease)    Hiatal hernia    History of cardiomyopathy in  adulthood 12/06/2021   in setting elevated troponin's w/ NSTEMI;  per echo  12-07-2021  ef 50-55%   History of heart murmur in childhood    since birth very slight   History of kidney stones    History of non-ST elevation myocardial infarction (NSTEMI) 12/06/2021   admission in epic---  elevated troponin's w/ NSTEMI  and stress cardiomyopathy,  12-07-2021 per cath normal coronaries ;  cardiology hospital follow-up with Bernadene Person NP 12-26-2021   Hyperlipidemia, mixed    Hypertension    IDA (iron deficiency anemia)    OA (osteoarthritis)    thumb and fingers, knee, right hip   OSA (obstructive sleep apnea)    12-27-2021  per pt OSA yrs ago last used cpap approx. 2013,  stated retested this year and is waiting on cpap machine to arrive   Renal disorder    Type 2 diabetes mellitus (HCC) 2008   followed by pcp   (12-27-2021  pt sated checks blood sugar am daily fasting, average 155-170)   Wears glasses     Past Surgical History:  Procedure Laterality Date   BREAST EXCISIONAL BIOPSY Right    x2  last one 1980s , benign   CARPAL TUNNEL RELEASE Right 05/22/2017   Procedure: RIGHT CARPAL TUNNEL RELEASE;  Surgeon: Cindee Salt, MD;  Location: [facility];  Service: Orthopedics;  Laterality: Right;   CARPAL TUNNEL RELEASE Left 03/12/2018   Procedure: LEFT CARPAL TUNNEL RELEASE;  Surgeon: Cindee Salt, MD;  Location: [facility];  Service: Orthopedics;  Laterality: Left;   CYSTOSCOPY W/ URETERAL STENT PLACEMENT Bilateral 12/11/2021   Procedure: CYSTOSCOPY , BILATERAL RETROGRADE , BILATERAL STENT PLACEMENT;  Surgeon: Sebastian Ache, MD;  Location: [facility] OR;  Service: Urology;  Laterality: Bilateral;   CYSTOSCOPY WITH RETROGRADE PYELOGRAM, URETEROSCOPY AND STENT PLACEMENT Bilateral 01/02/2022   Procedure: CYSTOSCOPY WITH RETROGRADE PYELOGRAM, URETEROSCOPY AND STENT REPLACEMENT;  Surgeon: Sebastian Ache, MD;  Location: [facility];  Service: Urology;   Laterality: Bilateral;   DIAGNOSTIC LAPAROSCOPY     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   DILATION AND CURETTAGE OF UTERUS     yrs ago for missed ab   EXTRACORPOREAL SHOCK WAVE LITHOTRIPSY  03/2017   HOLMIUM LASER APPLICATION Bilateral 01/02/2022   Procedure: HOLMIUM LASER APPLICATION;  Surgeon: Sebastian Ache, MD;  Location: [facility];  Service: Urology;  Laterality: Bilateral;   LAPAROSCOPIC APPENDECTOMY  2008   LEFT HEART CATH AND CORONARY ANGIOGRAPHY N/A 12/07/2021   Procedure: LEFT HEART CATH AND CORONARY ANGIOGRAPHY;  Surgeon: Lyn Records, MD;  Location: [facility];  Service: Cardiovascular;  Laterality: N/A;   LIPOMA EXCISION  2003   right shoulder   SCLERAL BUCKLE  10/15/2011   Procedure: SCLERAL BUCKLE;  Surgeon: Sherrie George, MD;  Location: [facility];  Service: Ophthalmology;  Laterality: Right;   SHOULDER ARTHROSCOPY WITH ROTATOR CUFF REPAIR Right 03/25/2019   Procedure: Right shoulder arthroscopy, subacromial decompression, distal clavicle resection, rotator cuff repair;  Surgeon: Francena Hanly, MD;  Location: [facility];  Service: Orthopedics;  Laterality: Right;    WISDOM TOOTH EXTRACTION      Social History:   reports that she has never smoked. She has never used smokeless tobacco. She reports that she does not drink alcohol and does not use drugs.  Allergies  Allergen Reactions   Amoxicillin Hives    Did it involve swelling of the face/tongue/throat, SOB, or low BP? No Did it involve sudden or severe rash/hives, skin peeling, or any reaction on the inside of your mouth or nose? No Did you need to seek medical attention at a hospital or doctor's office? No When did it last happen?      15-20 years ago If all above answers are "NO", may proceed with cephalosporin use.    Aspirin Swelling   Bee Venom Other (See Comments)    Causes cellulitis   Clarithromycin Other (See Comments)    Gi upset   Erythromycin Nausea Only and Other (See Comments)     Stomach pain, also   Ibuprofen Swelling   Oxycodone Itching   Sulfa Antibiotics Hives   Latex Rash   Tape Rash and Other (See Comments)    Band-Aids, also = blisters    Family History  Problem Relation Age of Onset   Unexplained death Sister 51       died in her sleep   Colon cancer Paternal Grandmother    Colon cancer Paternal Uncle    Allergic rhinitis Neg Hx    Angioedema Neg Hx    Asthma Neg Hx    Atopy Neg Hx    Eczema Neg Hx    Immunodeficiency Neg Hx    Urticaria Neg Hx    Colon polyps Neg Hx    Esophageal cancer Neg Hx    Stomach cancer Neg Hx    Rectal cancer Neg Hx    Breast cancer Neg Hx      Prior to Admission medications   Medication Sig Start  Date End Date Taking? Authorizing Provider  acetaminophen (TYLENOL) 500 MG tablet Take 500 mg by mouth every 6 (six) hours as needed for mild pain.    [provider]  albuterol (PROAIR HFA) 108 (90 Base) MCG/ACT inhaler Can inhale two puffs every four to six hours as needed for cough or wheeze. Patient not taking: Reported on 09/03/2022 06/08/20   Jessica Priest, MD  amLODipine (NORVASC) 5 MG tablet Take 1 tablet (5 mg total) by mouth daily. Patient taking differently: Take 5 mg by mouth daily. 12/16/21   Regalado, Belkys A, MD  atorvastatin (LIPITOR) 40 MG tablet Take 40 mg by mouth at bedtime.    [provider]  azithromycin (ZITHROMAX) 250 MG tablet Take 1 tablet (250 mg total) by mouth daily. Take first 2 tablets together, then 1 every day until finished. 06/29/23   Geoffery Lyons, MD  b complex vitamins tablet Take 1 tablet by mouth at bedtime.    [provider]  cephALEXin (KEFLEX) 500 MG capsule Take 1 capsule (500 mg total) by mouth 3 (three) times daily. 06/29/23   Geoffery Lyons, MD  cetirizine (ZYRTEC) 10 MG tablet Take 10 mg by mouth daily.    [provider]  citalopram (CELEXA) 40 MG tablet Take 20 mg by mouth daily.    [provider]  fenofibrate (TRICOR) 145 MG  tablet Take 145 mg by mouth at bedtime. 09/22/19   [provider]  Ferrous Sulfate (SLOW FE PO) Take 1 tablet by mouth daily.    [provider]  glimepiride (AMARYL) 1 MG tablet Take 0.5 mg by mouth 2 (two) times daily. 02/15/19   [provider]  montelukast (SINGULAIR) 10 MG tablet TAKE 1 TABLET BY MOUTH EVERY DAY Patient taking differently: Take 10 mg by mouth at bedtime. 07/19/19   Kozlow, Alvira Philips, MD  omeprazole (PRILOSEC) 40 MG capsule TAKE 1 CAPSULE BY MOUTH EVERY DAY IN THE MORNING BEFORE BREAKFAST Patient taking differently: Take 40 mg by mouth daily. 07/30/21   Kozlow, Alvira Philips, MD  OZEMPIC, 1 MG/DOSE, 4 MG/3ML SOPN Inject 2 mg into the skin once a week. Wednesday's 05/25/20   [provider]  triamcinolone (NASACORT) 55 MCG/ACT AERO nasal inhaler Place 1 spray into the nose daily as needed (allergies). Patient not taking: Reported on 09/03/2022    [provider]  Vitamin D, Ergocalciferol, (DRISDOL) 50000 UNITS CAPS capsule Take 50,000 Units by mouth every 30 (thirty) days.     [provider]    Physical Exam: Vitals:   06/30/23 0046 06/30/23 0315 06/30/23 0326 06/30/23 0400  BP: (!) 157/71 136/63  134/64  Pulse: 78 78  78  Resp: (!) 22 (!) 23  13  Temp: 99.1 F (37.3 C)     TempSrc: Oral     SpO2: 93% 90% 94% 96%  Weight:      Height:        Constitutional: NAD, calm  Eyes: PERTLA, lids and conjunctivae normal ENMT: Mucous membranes are moist. Posterior pharynx clear of any exudate or lesions.   Neck: supple, no masses  Respiratory: Speaking full sentences. Coarse rales on left. No wheezing.  Cardiovascular: S1 & S2 heard, regular rate and rhythm. No extremity edema.   Abdomen: No distension, no tenderness, soft. Bowel sounds active.  Musculoskeletal: no clubbing / cyanosis. No joint deformity upper and lower extremities.   Skin: no significant rashes, lesions, ulcers. Warm, dry, well-perfused. Neurologic: CN 2-12  grossly intact. Moving all extremities.  Alert and oriented.  Psychiatric: Pleasant. Cooperative.    Labs and Imaging on Admission: I have personally reviewed following labs and imaging studies  CBC: Recent Labs  Lab 06/28/23 2308 06/30/23 0103  WBC 19.6* 20.1*  NEUTROABS 16.3* 16.7*  HGB 14.6 13.0  HCT 43.2 37.6  MCV 88.7 89.5  PLT 309 266   Basic Metabolic Panel: Recent Labs  Lab 06/28/23 2308 06/30/23 0103  NA 137 133*  K 3.7 2.9*  CL 100 99  CO2 23 22  GLUCOSE 190* 211*  BUN 20 19  CREATININE 1.08* 1.02*  CALCIUM 9.7 9.3   GFR: Estimated Creatinine Clearance: 54.5 mL/min (A) (by C-G formula based on SCr of 1.02 mg/dL (H)). Liver Function Tests: Recent Labs  Lab 06/28/23 2308  AST 31  ALT 34  ALKPHOS 41  BILITOT 0.9  PROT 8.1  ALBUMIN 4.3   No results for input(s): "LIPASE", "AMYLASE" in the last 168 hours. No results for input(s): "AMMONIA" in the last 168 hours. Coagulation Profile: No results for input(s): "INR", "PROTIME" in the last 168 hours. Cardiac Enzymes: No results for input(s): "CKTOTAL", "CKMB", "CKMBINDEX", "TROPONINI" in the last 168 hours. BNP (last 3 results) No results for input(s): "PROBNP" in the last 8760 hours. HbA1C: No results for input(s): "HGBA1C" in the last 72 hours. CBG: Recent Labs  Lab 06/29/23 0245  GLUCAP 204*   Lipid Profile: No results for input(s): "CHOL", "HDL", "LDLCALC", "TRIG", "CHOLHDL", "LDLDIRECT" in the last 72 hours. Thyroid Function Tests: No results for input(s): "TSH", "T4TOTAL", "FREET4", "T3FREE", "THYROIDAB" in the last 72 hours. Anemia Panel: No results for input(s): "VITAMINB12", "FOLATE", "FERRITIN", "TIBC", "IRON", "RETICCTPCT" in the last 72 hours. Urine analysis:    Component Value Date/Time   COLORURINE YELLOW 06/28/2023 2248   APPEARANCEUR CLEAR 06/28/2023 2248   LABSPEC 1.015 06/28/2023 2248   PHURINE 5.5 06/28/2023 2248   GLUCOSEU >=500 (A) 06/28/2023 2248   HGBUR NEGATIVE  06/28/2023 2248   BILIRUBINUR NEGATIVE 06/28/2023 2248   KETONESUR NEGATIVE 06/28/2023 2248   PROTEINUR NEGATIVE 06/28/2023 2248   NITRITE NEGATIVE 06/28/2023 2248   LEUKOCYTESUR NEGATIVE 06/28/2023 2248   Sepsis Labs: @LABRCNTIP (procalcitonin:4,lacticidven:4) ) Recent Results (from the past 240 hours)  Resp panel by RT-PCR (RSV, Flu A&B, Covid) Anterior Nasal Swab     Status: None   Collection Time: 06/28/23  7:16 PM   Specimen: Anterior Nasal Swab  Result Value Ref Range Status   SARS Coronavirus 2 by RT PCR NEGATIVE NEGATIVE Final    Comment: (NOTE) SARS-CoV-2 target nucleic acids are NOT DETECTED.  The SARS-CoV-2 RNA is generally detectable in upper respiratory specimens during the acute phase of infection. The lowest concentration of SARS-CoV-2 viral copies this assay can detect is 138 copies/mL. A negative result does not preclude SARS-Cov-2 infection and should not be used as the sole basis for treatment or other patient management decisions. A negative result may occur with  improper specimen collection/handling, submission of specimen other than nasopharyngeal swab, presence of viral mutation(s) within the areas targeted by this assay, and inadequate number of viral copies(<138 copies/mL). A negative result must be combined with clinical observations, patient history, and epidemiological information. The expected result is Negative.  Fact Sheet for Patients:  BloggerCourse.com  Fact Sheet for Healthcare Providers:  SeriousBroker.it  This test is no t yet approved or cleared by the [place] FDA and  has been authorized for detection and/or diagnosis of SARS-CoV-2 by FDA under an Emergency Use Authorization (EUA). This EUA will remain  in effect (meaning this test can be used) for the duration of the COVID-19 declaration under Section 564(b)(1) of the Act, 21 U.S.C.section 360bbb-3(b)(1), unless the authorization  is terminated  or revoked sooner.       Influenza A by PCR NEGATIVE NEGATIVE Final   Influenza B by PCR NEGATIVE NEGATIVE Final    Comment: (NOTE) The Xpert Xpress SARS-CoV-2/FLU/RSV plus assay is intended as an aid in the diagnosis of influenza from Nasopharyngeal swab specimens and should not be used as a sole basis for treatment. Nasal washings and aspirates are unacceptable for Xpert Xpress SARS-CoV-2/FLU/RSV testing.  Fact Sheet for Patients: BloggerCourse.com  Fact Sheet for Healthcare Providers: SeriousBroker.it  This test is not yet approved or cleared by the [place] FDA and has been authorized for detection and/or diagnosis of SARS-CoV-2 by FDA under an Emergency Use Authorization (EUA). This EUA will remain in effect (meaning this test can be used) for the duration of the COVID-19 declaration under Section 564(b)(1) of the Act, 21 U.S.C. section 360bbb-3(b)(1), unless the authorization is terminated or revoked.     Resp Syncytial Virus by PCR NEGATIVE NEGATIVE Final    Comment: (NOTE) Fact Sheet for Patients: BloggerCourse.com  Fact Sheet for Healthcare Providers: SeriousBroker.it  This test is not yet approved or cleared by the [place] FDA and has been authorized for detection and/or diagnosis of SARS-CoV-2 by FDA under an Emergency Use Authorization (EUA). This EUA will remain in effect (meaning this test can be used) for the duration of the COVID-19 declaration under Section 564(b)(1) of the Act, 21 U.S.C. section 360bbb-3(b)(1), unless the authorization is terminated or revoked.  Performed at [facility], [address] [place], [place] [address]      Radiological Exams on Admission: CT Angio Chest PE W and/or Wo Contrast Result Date: 06/30/2023 CLINICAL DATA:  Syncope or presyncope with cerebrovascular cause suspected. Increasing  pain to the left lung and ribs. Worsening shortness of breath. Diagnosed yesterday with pneumonia. EXAM: CT ANGIOGRAPHY CHEST WITH CONTRAST TECHNIQUE: Multidetector CT imaging of the chest was performed using the standard protocol during bolus administration of intravenous contrast. Multiplanar CT image reconstructions and MIPs were obtained to evaluate the vascular anatomy. RADIATION DOSE REDUCTION: This exam was performed according to the departmental dose-optimization program which includes automated exposure control, adjustment of the mA and/or kV according to patient size and/or use of iterative reconstruction technique. CONTRAST:  75mL OMNIPAQUE IOHEXOL 350 MG/ML SOLN COMPARISON:  Chest radiograph 06/30/2023. FINDINGS: Cardiovascular: Technically adequate study with good opacification of the central and segmental pulmonary arteries. No focal filling defects. No evidence of significant pulmonary embolus. Normal heart size. No pericardial effusions. Normal caliber thoracic aorta. No aortic dissection. Minimal aortic and coronary artery calcifications. Mediastinum/Nodes: Moderate esophageal hiatal hernia. Esophagus is decompressed. No significant lymphadenopathy. Thyroid gland is unremarkable. Lungs/Pleura: Dense consolidation in the left lower lung likely representing lobar pneumonia. Right lung is clear. No pleural effusion or pneumothorax. Upper Abdomen: Diffuse fatty infiltration of the liver. No acute changes. Musculoskeletal: No chest wall abnormality. No acute or significant osseous findings. Review of the MIP images confirms the above findings. IMPRESSION: 1. No evidence of significant pulmonary embolus. 2. Left lower lung consolidation likely representing lobar pneumonia. Follow-up to resolution is recommended to exclude underlying obstructing lesion. 3. Esophageal hiatal hernia. 4. Fatty infiltration of the liver. 5. Minimal aortic atherosclerosis. Electronically Signed   By: Burman Nieves M.D.   On:  06/30/2023 03:59   DG Chest 2  View Result Date: 06/30/2023 CLINICAL DATA:  Chest pain shortness of breath EXAM: CHEST - 2 VIEW COMPARISON:  06/28/2023 FINDINGS: Increasing left basilar airspace opacity is noted. Small left pleural effusion is now noted. Cardiac shadow is stable. Right lung remains clear. IMPRESSION: Increasing left lower lobe infiltrate with associated small effusion. Electronically Signed   By: Alcide Clever M.D.   On: 06/30/2023 02:32   DG Chest 2 View Result Date: 06/28/2023 CLINICAL DATA:  Cough Recent flu that turned into a sinus infection. Facial pressure and headache today, not controlled w/ tylenol. Crackles in left base. Non smoker. Asthmatic. Tested neg twice for strep/rsv/covid. EXAM: CHEST - 2 VIEW COMPARISON:  Chest x-ray 12/31/2021 FINDINGS: The heart and mediastinal contours are within normal limits. Question developing left lower lobe airspace opacity. No pulmonary edema. No pleural effusion. No pneumothorax. No acute osseous abnormality. IMPRESSION: question developing left lower lobe airspace opacity. Electronically Signed   By: Tish Frederickson M.D.   On: 06/28/2023 23:14    EKG: Independently reviewed. Sinus rhythm.   Assessment/Plan   1. Pneumonia; acute hypoxic respiratory failure - Check strep pneumo and legionella antigens, continue treatment with Rocephin and azithromycin, continue supplemental O2 as-needed, follow cultures and clinical course    2. Hypokalemia  - Replacing     3. Hypertension  - Norvasc    4. Hyperlipidemia  - Lipitor   5. Type II DM  - A1c was 6.5% in September 2023  - Check CBGs and use low-intensity SSI for now    6. Asthma; OSA  - Not in exacerbation  - Singulair, as-needed albuterol, CPAP while sleeping   7. Depression  - Celexa   8. Smudge cells  - Smudge cells noted on peripheral smear, percentage not specified  - Could be d/t the infection but she will need further workup with flow cytometry if leukocytosis  persists     DVT prophylaxis: Lovenox  Code Status: Full  Level of Care: Level of care: Telemetry Family Communication: None present   Disposition Plan:  Patient is from: Home  Anticipated d/c is to: Home  Anticipated d/c date is: 07/03/23  Patient currently: Pending clinical improvement  Consults called: None  Admission status: Inpatient     Briscoe Deutscher, MD [facility]  06/30/2023, 5:12 AM

## 2023-06-30 NOTE — ED Notes (Signed)
 Patient transported to X-ray

## 2023-06-30 NOTE — Plan of Care (Signed)

## 2023-06-30 NOTE — ED Notes (Signed)
 [facility] called for transport.

## 2023-06-30 NOTE — Progress Notes (Signed)
 Pt understands and knows how to use.

## 2023-06-30 NOTE — ED Provider Notes (Signed)
 [facility] Provider Note   CSN: [id number] Arrival date & time: 06/30/23  0032     History  Chief Complaint  Patient presents with   Pneumonia    Jade Perez is a 68 y.o. female.  The history is provided by the patient.  Pneumonia This is a new problem. The current episode started more than 2 days ago. The problem occurs constantly. The problem has been gradually worsening. Associated symptoms include chest pain and shortness of breath. Associated symptoms comments: Left lateral . Nothing aggravates the symptoms. Nothing relieves the symptoms. She has tried acetaminophen (oral antibiotics) for the symptoms. The treatment provided no relief.  Patient with GERD presents with worsening CP and SOB.  She had a virus last week and had a cough.  PMD told her she was at the end of her symptoms.  But then fevers started along with SOB.  Was seen in the ED on 3/22 and diagnosed with pneumonia and given IV antibiotics and then started on oral antibiotics symptoms have worsened and O2 saturation declined at home.      Past Medical History:  Diagnosis Date   Allergic rhinitis    Asthma, mild intermittent    followed by pcp    (prevously followed by dr Lucie Leather w/ asthma/ allergy center)   Complication of anesthesia    difficulty waking up   Depression    GAD (generalized anxiety disorder)    GERD (gastroesophageal reflux disease)    Hiatal hernia    History of cardiomyopathy in adulthood 12/06/2021   in setting elevated troponin's w/ NSTEMI;  per echo  12-07-2021  ef 50-55%   History of heart murmur in childhood    since birth very slight   History of kidney stones    History of non-ST elevation myocardial infarction (NSTEMI) 12/06/2021   admission in epic---  elevated troponin's w/ NSTEMI  and stress cardiomyopathy,  12-07-2021 per cath normal coronaries ;  cardiology hospital follow-up with Bernadene Person NP 12-26-2021   Hyperlipidemia,  mixed    Hypertension    IDA (iron deficiency anemia)    OA (osteoarthritis)    thumb and fingers, knee, right hip   OSA (obstructive sleep apnea)    12-27-2021  per pt OSA yrs ago last used cpap approx. 2013,  stated retested this year and is waiting on cpap machine to arrive   Renal disorder    Type 2 diabetes mellitus (HCC) 2008   followed by pcp   (12-27-2021  pt sated checks blood sugar am daily fasting, average 155-170)   Wears glasses      Home Medications Prior to Admission medications   Medication Sig Start Date End Date Taking? Authorizing Provider  acetaminophen (TYLENOL) 500 MG tablet Take 500 mg by mouth every 6 (six) hours as needed for mild pain.    [provider]  albuterol (PROAIR HFA) 108 (90 Base) MCG/ACT inhaler Can inhale two puffs every four to six hours as needed for cough or wheeze. Patient not taking: Reported on 09/03/2022 06/08/20   Jessica Priest, MD  amLODipine (NORVASC) 5 MG tablet Take 1 tablet (5 mg total) by mouth daily. Patient taking differently: Take 5 mg by mouth daily. 12/16/21   Regalado, Belkys A, MD  atorvastatin (LIPITOR) 40 MG tablet Take 40 mg by mouth at bedtime.    [provider]  azithromycin (ZITHROMAX) 250 MG tablet Take 1 tablet (250 mg total) by mouth daily. Take  first 2 tablets together, then 1 every day until finished. 06/29/23   Geoffery Lyons, MD  b complex vitamins tablet Take 1 tablet by mouth at bedtime.    [provider]  cephALEXin (KEFLEX) 500 MG capsule Take 1 capsule (500 mg total) by mouth 3 (three) times daily. 06/29/23   Geoffery Lyons, MD  cetirizine (ZYRTEC) 10 MG tablet Take 10 mg by mouth daily.    [provider]  citalopram (CELEXA) 40 MG tablet Take 20 mg by mouth daily.    [provider]  fenofibrate (TRICOR) 145 MG tablet Take 145 mg by mouth at bedtime. 09/22/19   [provider]  Ferrous Sulfate (SLOW FE PO) Take 1 tablet by mouth daily.    [provider]  glimepiride (AMARYL) 1 MG tablet Take 0.5 mg by mouth 2 (two) times daily. 02/15/19   [provider]  montelukast (SINGULAIR) 10 MG tablet TAKE 1 TABLET BY MOUTH EVERY DAY Patient taking differently: Take 10 mg by mouth at bedtime. 07/19/19   Kozlow, Alvira Philips, MD  omeprazole (PRILOSEC) 40 MG capsule TAKE 1 CAPSULE BY MOUTH EVERY DAY IN THE MORNING BEFORE BREAKFAST Patient taking differently: Take 40 mg by mouth daily. 07/30/21   Kozlow, Alvira Philips, MD  OZEMPIC, 1 MG/DOSE, 4 MG/3ML SOPN Inject 2 mg into the skin once a week. Wednesday's 05/25/20   [provider]  triamcinolone (NASACORT) 55 MCG/ACT AERO nasal inhaler Place 1 spray into the nose daily as needed (allergies). Patient not taking: Reported on 09/03/2022    [provider]  Vitamin D, Ergocalciferol, (DRISDOL) 50000 UNITS CAPS capsule Take 50,000 Units by mouth every 30 (thirty) days.     [provider]      Allergies    Amoxicillin, Aspirin, Bee venom, Clarithromycin, Erythromycin, Ibuprofen, Oxycodone, Sulfa antibiotics, Latex, and Tape    Review of Systems   Review of Systems  Respiratory:  Positive for shortness of breath.   Cardiovascular:  Positive for chest pain.  All other systems reviewed and are negative.   Physical Exam Updated Vital Signs BP (!) 157/71 (BP Location: Right Arm)   Pulse 78   Temp 99.1 F (37.3 C) (Oral)   Resp (!) 22   Ht 5\' 2"  (1.575 m)   Wt 90.7 kg   SpO2 94%   BMI 36.58 kg/m  Physical Exam Vitals and nursing note reviewed.  Constitutional:      General: She is not in acute distress.    Appearance: Normal appearance. She is well-developed.  HENT:     Head: Normocephalic and atraumatic.     Nose: Nose normal.  Eyes:     Pupils: Pupils are equal, round, and reactive to light.  Cardiovascular:     Rate and Rhythm: Normal rate and regular rhythm.     Pulses: Normal pulses.     Heart sounds: Normal heart sounds.  Pulmonary:     Effort: Pulmonary effort  is normal. No respiratory distress.     Breath sounds: Normal breath sounds. No rales.  Abdominal:     General: Bowel sounds are normal. There is no distension.     Palpations: Abdomen is soft.     Tenderness: There is no abdominal tenderness. There is no guarding or rebound.  Musculoskeletal:        General: Normal range of motion.     Cervical back: Neck supple.  Skin:    General: Skin is dry.     Capillary Refill: Capillary  refill takes less than 2 seconds.     Findings: No erythema or rash.  Neurological:     General: No focal deficit present.     Mental Status: She is alert.     Deep Tendon Reflexes: Reflexes normal.  Psychiatric:        Mood and Affect: Mood normal.     ED Results / Procedures / Treatments   Labs (all labs ordered are listed, but only abnormal results are displayed) Results for orders placed or performed during the hospital encounter of 06/30/23  CBC with Differential   Collection Time: 06/30/23  1:03 AM  Result Value Ref Range   WBC 20.1 (H) 4.0 - 10.5 K/uL   RBC 4.20 3.87 - 5.11 MIL/uL   Hemoglobin 13.0 12.0 - 15.0 g/dL   HCT 42.5 95.6 - 38.7 %   MCV 89.5 80.0 - 100.0 fL   MCH 31.0 26.0 - 34.0 pg   MCHC 34.6 30.0 - 36.0 g/dL   RDW 56.4 33.2 - 95.1 %   Platelets 266 150 - 400 K/uL   nRBC 0.0 0.0 - 0.2 %   Neutrophils Relative % 85 %   Neutro Abs 16.7 (H) 1.7 - 7.7 K/uL   Lymphocytes Relative 7 %   Lymphs Abs 1.3 0.7 - 4.0 K/uL   Monocytes Relative 8 %   Monocytes Absolute 1.7 (H) 0.1 - 1.0 K/uL   Eosinophils Relative 0 %   Eosinophils Absolute 0.0 0.0 - 0.5 K/uL   Basophils Relative 0 %   Basophils Absolute 0.0 0.0 - 0.1 K/uL   WBC Morphology SMUDGE CELLS    RBC Morphology MORPHOLOGY UNREMARKABLE    Smear Review MORPHOLOGY UNREMARKABLE    Immature Granulocytes 0 %   Abs Immature Granulocytes 0.06 0.00 - 0.07 K/uL  Basic metabolic panel   Collection Time: 06/30/23  1:03 AM  Result Value Ref Range   Sodium 133 (L) 135 - 145 mmol/L    Potassium 2.9 (L) 3.5 - 5.1 mmol/L   Chloride 99 98 - 111 mmol/L   CO2 22 22 - 32 mmol/L   Glucose, Bld 211 (H) 70 - 99 mg/dL   BUN 19 8 - 23 mg/dL   Creatinine, Ser 8.84 (H) 0.44 - 1.00 mg/dL   Calcium 9.3 8.9 - 16.6 mg/dL   GFR, Estimated 60 (L) >60 mL/min   Anion gap 12 5 - 15   CT Angio Chest PE W and/or Wo Contrast Result Date: 06/30/2023 CLINICAL DATA:  Syncope or presyncope with cerebrovascular cause suspected. Increasing pain to the left lung and ribs. Worsening shortness of breath. Diagnosed yesterday with pneumonia. EXAM: CT ANGIOGRAPHY CHEST WITH CONTRAST TECHNIQUE: Multidetector CT imaging of the chest was performed using the standard protocol during bolus administration of intravenous contrast. Multiplanar CT image reconstructions and MIPs were obtained to evaluate the vascular anatomy. RADIATION DOSE REDUCTION: This exam was performed according to the departmental dose-optimization program which includes automated exposure control, adjustment of the mA and/or kV according to patient size and/or use of iterative reconstruction technique. CONTRAST:  75mL OMNIPAQUE IOHEXOL 350 MG/ML SOLN COMPARISON:  Chest radiograph 06/30/2023. FINDINGS: Cardiovascular: Technically adequate study with good opacification of the central and segmental pulmonary arteries. No focal filling defects. No evidence of significant pulmonary embolus. Normal heart size. No pericardial effusions. Normal caliber thoracic aorta. No aortic dissection. Minimal aortic and coronary artery calcifications. Mediastinum/Nodes: Moderate esophageal hiatal hernia. Esophagus is decompressed. No significant lymphadenopathy. Thyroid gland is unremarkable. Lungs/Pleura: Dense consolidation in the left  lower lung likely representing lobar pneumonia. Right lung is clear. No pleural effusion or pneumothorax. Upper Abdomen: Diffuse fatty infiltration of the liver. No acute changes. Musculoskeletal: No chest wall abnormality. No acute or  significant osseous findings. Review of the MIP images confirms the above findings. IMPRESSION: 1. No evidence of significant pulmonary embolus. 2. Left lower lung consolidation likely representing lobar pneumonia. Follow-up to resolution is recommended to exclude underlying obstructing lesion. 3. Esophageal hiatal hernia. 4. Fatty infiltration of the liver. 5. Minimal aortic atherosclerosis. Electronically Signed   By: Burman Nieves M.D.   On: 06/30/2023 03:59   DG Chest 2 View Result Date: 06/30/2023 CLINICAL DATA:  Chest pain shortness of breath EXAM: CHEST - 2 VIEW COMPARISON:  06/28/2023 FINDINGS: Increasing left basilar airspace opacity is noted. Small left pleural effusion is now noted. Cardiac shadow is stable. Right lung remains clear. IMPRESSION: Increasing left lower lobe infiltrate with associated small effusion. Electronically Signed   By: Alcide Clever M.D.   On: 06/30/2023 02:32   DG Chest 2 View Result Date: 06/28/2023 CLINICAL DATA:  Cough Recent flu that turned into a sinus infection. Facial pressure and headache today, not controlled w/ tylenol. Crackles in left base. Non smoker. Asthmatic. Tested neg twice for strep/rsv/covid. EXAM: CHEST - 2 VIEW COMPARISON:  Chest x-ray 12/31/2021 FINDINGS: The heart and mediastinal contours are within normal limits. Question developing left lower lobe airspace opacity. No pulmonary edema. No pleural effusion. No pneumothorax. No acute osseous abnormality. IMPRESSION: question developing left lower lobe airspace opacity. Electronically Signed   By: Tish Frederickson M.D.   On: 06/28/2023 23:14    EKG  EKG Interpretation Date/Time:  Monday June 30 2023 00:56:19 EDT Ventricular Rate:  75 PR Interval:  162 QRS Duration:  93 QT Interval:  386 QTC Calculation: 432 R Axis:   51  Text Interpretation: Sinus rhythm Probable left atrial enlargement Borderline repolarization abnormality Confirmed by Nicanor Alcon, Andromeda Poppen ([id number]) on 06/30/2023 4:16:16 AM          Radiology CT Angio Chest PE W and/or Wo Contrast Result Date: 06/30/2023 CLINICAL DATA:  Syncope or presyncope with cerebrovascular cause suspected. Increasing pain to the left lung and ribs. Worsening shortness of breath. Diagnosed yesterday with pneumonia. EXAM: CT ANGIOGRAPHY CHEST WITH CONTRAST TECHNIQUE: Multidetector CT imaging of the chest was performed using the standard protocol during bolus administration of intravenous contrast. Multiplanar CT image reconstructions and MIPs were obtained to evaluate the vascular anatomy. RADIATION DOSE REDUCTION: This exam was performed according to the departmental dose-optimization program which includes automated exposure control, adjustment of the mA and/or kV according to patient size and/or use of iterative reconstruction technique. CONTRAST:  75mL OMNIPAQUE IOHEXOL 350 MG/ML SOLN COMPARISON:  Chest radiograph 06/30/2023. FINDINGS: Cardiovascular: Technically adequate study with good opacification of the central and segmental pulmonary arteries. No focal filling defects. No evidence of significant pulmonary embolus. Normal heart size. No pericardial effusions. Normal caliber thoracic aorta. No aortic dissection. Minimal aortic and coronary artery calcifications. Mediastinum/Nodes: Moderate esophageal hiatal hernia. Esophagus is decompressed. No significant lymphadenopathy. Thyroid gland is unremarkable. Lungs/Pleura: Dense consolidation in the left lower lung likely representing lobar pneumonia. Right lung is clear. No pleural effusion or pneumothorax. Upper Abdomen: Diffuse fatty infiltration of the liver. No acute changes. Musculoskeletal: No chest wall abnormality. No acute or significant osseous findings. Review of the MIP images confirms the above findings. IMPRESSION: 1. No evidence of significant pulmonary embolus. 2. Left lower lung consolidation likely representing lobar pneumonia. Follow-up to resolution  is recommended to exclude underlying  obstructing lesion. 3. Esophageal hiatal hernia. 4. Fatty infiltration of the liver. 5. Minimal aortic atherosclerosis. Electronically Signed   By: Burman Nieves M.D.   On: 06/30/2023 03:59   DG Chest 2 View Result Date: 06/30/2023 CLINICAL DATA:  Chest pain shortness of breath EXAM: CHEST - 2 VIEW COMPARISON:  06/28/2023 FINDINGS: Increasing left basilar airspace opacity is noted. Small left pleural effusion is now noted. Cardiac shadow is stable. Right lung remains clear. IMPRESSION: Increasing left lower lobe infiltrate with associated small effusion. Electronically Signed   By: Alcide Clever M.D.   On: 06/30/2023 02:32   DG Chest 2 View Result Date: 06/28/2023 CLINICAL DATA:  Cough Recent flu that turned into a sinus infection. Facial pressure and headache today, not controlled w/ tylenol. Crackles in left base. Non smoker. Asthmatic. Tested neg twice for strep/rsv/covid. EXAM: CHEST - 2 VIEW COMPARISON:  Chest x-ray 12/31/2021 FINDINGS: The heart and mediastinal contours are within normal limits. Question developing left lower lobe airspace opacity. No pulmonary edema. No pleural effusion. No pneumothorax. No acute osseous abnormality. IMPRESSION: question developing left lower lobe airspace opacity. Electronically Signed   By: Tish Frederickson M.D.   On: 06/28/2023 23:14    Procedures Procedures    Medications Ordered in ED Medications  azithromycin (ZITHROMAX) 500 mg in sodium chloride 0.9 % 250 mL IVPB (500 mg Intravenous New Bag/Given 06/30/23 0329)  fentaNYL (SUBLIMAZE) injection 50 mcg (has no administration in time range)  ondansetron (ZOFRAN) injection 4 mg (has no administration in time range)  potassium chloride SA (KLOR-CON M) CR tablet 40 mEq (40 mEq Oral Given 06/30/23 0247)  cefTRIAXone (ROCEPHIN) 1 g in sodium chloride 0.9 % 100 mL IVPB (1 g Intravenous New Bag/Given 06/30/23 0251)  iohexol (OMNIPAQUE) 350 MG/ML injection 75 mL (75 mLs Intravenous Contrast Given 06/30/23 0354)     ED Course/ Medical Decision Making/ A&P                                 Medical Decision Making Patient with PNA diagnosed on 3/22 and given IV meds and transitioned to oral antibiotics with worsening symptoms   Amount and/or Complexity of Data Reviewed Independent Historian: spouse    Details: See above  External Data Reviewed: labs, radiology and notes.    Details: Previous ED visit reviewed.  White count continue to be 20  Labs: ordered.    Details: White count elevated 20.1, normal platelets.  Sodium slight low 133, potassium low 2.9, creatinine slight elevation 1.2 Radiology: ordered and independent interpretation performed.    Details: PNA by me on CXR ECG/medicine tests: ordered and independent interpretation performed. Decision-making details documented in ED Course.  Risk Prescription drug management. Decision regarding hospitalization.    Final Clinical Impression(s) / ED Diagnoses Final diagnoses:  None   The patient appears reasonably stabilized for admission considering the current resources, flow, and capabilities available in the ED at this time, and I doubt any other [facility] requiring further screening and/or treatment in the ED prior to admission.  Rx / DC Orders ED Discharge Orders     None         Michah Minton, MD 06/30/23 [phone]

## 2023-06-30 NOTE — Hospital Course (Addendum)
 Brief Narrative:  68 year old with history of HTN, HLD, DM2, OSA, depression, asthma, NSTEMI with normal coronaries admitted for shortness of breath and cough.  Outpatient diagnosed of left lower lobe pneumonia on 3/22 but returns due to worsening of her pain and discomfort.  CTA chest negative for PE but does show left lower lobe consolidation started on Rocephin and azithromycin.  Unfortunately chest x-ray showed worsening of left-sided pleural effusion, thoracentesis on 3/27 yielded 320 cc with due to persistence of this effusion, CT was performed which showed concerns of loculation therefore chest tube placed 3/28.  Patient had chest tube placed and received 3 doses of lytics since then.  Repeat CT chest on 4/1 shows quite a bit of improvement.  3/27-thoracentesis yielding 320 cc 3/28-persistent loculated effusions noted on CT chest and chest tube was placed 3/29, 3/30, 3/31 -received 3 doses of lytics once daily 4/1 -repeat CT chest shows improvement in effusion.  Only small loculations  Assessment & Plan:  Principal Problem:   CAP (community acquired pneumonia) Active Problems:   Intrinsic asthma   Type 2 diabetes mellitus without complication, without long-term current use of insulin (HCC)   Acute respiratory failure with hypoxia (HCC)   Hypokalemia   Hypertension   Depression   OSA (obstructive sleep apnea)   Hyperlipidemia, mixed    Left lower lobe community-acquired pneumonia; mrsa Left-sided pleural effusion, worsening Failed outpatient treatment.  During hospitalization also received IV Rocephin and azithromycin without any improvement.  Initial CTA showed left lower lobe consolidation but later repeat x-ray showed worsening of pleural effusion requiring thoracentesis on 3/27 yielding 320 cc without much improvement.  Repeat CT chest showed persistent of effusion with loculation therefore chest tube placed on 3/28.  Thereafter received 3 doses of lytics.  Cultures grew MRSA  therefore linezolid - Repeat CT chest 4/1 shows improved and loculations.  There is some small loculated effusion but appears dependent in nature.  Hopefully we can take the chest tube out today but will defer this decision to pulmonary.  Hypokalemia/hypophosphatemia Replete as needed  Hyponatremia - Suspect from hypovolemia.  Improved with hydration.  Encourage p.o. intake   Essential hypertension Norvasc.  IV as needed   Hyperlipidemia Statin   Diabetes mellitus type 2; uncontrolled due to hyperglycmia.  - A1c was 6.5% in September 2023  -Holding home Ozempic.  Continue Amaryl.  Adjust long-acting and Premeal as necessary Sliding scale and Accu-Cheks   History of asthma and OSA Bronchodilators   Depression - Celexa    Smudge cells - Smudge cells noted on peripheral smear, percentage not specified  - Could be d/t the infection but she will need further workup with flow cytometry if leukocytosis persists   PT/OT-home health, face-to-face completed.  Will need updated PT eval Ambulatory pulse ox  DVT prophylaxis: Lovenox    Code Status: Full Code Family Communication: Family at bedside Status is: Inpatient Remains inpatient appropriate because: Hopefully discharge in next 24-48 hours   Subjective: Feels a little better compared to yesterday.  Awaiting pulmonary evaluation today to see if we can remove the chest tube.  Examination:  General exam: Appears calm and comfortable  Respiratory system: Left side diminished breath sounds, better air movement Cardiovascular system: S1 & S2 heard, RRR. No JVD, murmurs, rubs, gallops or clicks. No pedal edema. Gastrointestinal system: Abdomen is nondistended, soft and nontender. No organomegaly or masses felt. Normal bowel sounds heard. Central nervous system: Alert and oriented. No focal neurological deficits. Extremities: Symmetric 5 x 5 power.  Skin: Blood clot around chest tube insertion site s Psychiatry: Judgement and  insight appear normal. Mood & affect appropriate. Left-sided chest tube

## 2023-06-30 NOTE — Progress Notes (Signed)
 PROGRESS NOTE    Jade Perez  VHQ:[id number] DOB: October 08, 1955 DOA: 06/30/2023 PCP: Abner Greenspan, MD    Brief Narrative:  68 year old with history of HTN, HLD, DM2, OSA, depression, asthma, NSTEMI with normal coronaries admitted for shortness of breath and cough.  Outpatient diagnosed of left lower lobe pneumonia on 3/22 but returns due to worsening of her pain and discomfort.  CTA chest negative for PE but does show left lower lobe consolidation started on Rocephin and azithromycin.   Assessment & Plan:  Principal Problem:   CAP (community acquired pneumonia) Active Problems:   Intrinsic asthma   Type 2 diabetes mellitus without complication, without long-term current use of insulin (HCC)   Acute respiratory failure with hypoxia (HCC)   Hypokalemia   Hypertension   Depression   OSA (obstructive sleep apnea)   Hyperlipidemia, mixed    Left lower lobe community-acquired pneumonia Failed outpatient therapy.  Now admitted.  CTA chest is negative for PE.  Continue bronchodilators, I-S/flutter valve.  On Rocephin/azithromycin   Hypokalemia Replete as needed   Essential hypertension Norvasc.  IV as needed   Hyperlipidemia Statin   Diabetes mellitus type 2 - A1c was 6.5% in September 2023  -Holding home Ozempic.  Continue Amaryl Sliding scale and Accu-Cheks   History of asthma and OSA Bronchodilators   Depression - Celexa    Smudge cells - Smudge cells noted on peripheral smear, percentage not specified  - Could be d/t the infection but she will need further workup with flow cytometry if leukocytosis persists   DVT prophylaxis: Lovenox    Code Status: Full Code Family Communication:   Status is: Inpatient Remains inpatient appropriate because: Continue hospital side for abnormal breath sounds    Subjective: Still having some exertional dyspnea but overall feels little better compared to yesterday   Examination:  General exam: Appears calm and comfortable   Respiratory system: Mild bilateral rhonchi Cardiovascular system: S1 & S2 heard, RRR. No JVD, murmurs, rubs, gallops or clicks. No pedal edema. Gastrointestinal system: Abdomen is nondistended, soft and nontender. No organomegaly or masses felt. Normal bowel sounds heard. Central nervous system: Alert and oriented. No focal neurological deficits. Extremities: Symmetric 5 x 5 power. Skin: No rashes, lesions or ulcers Psychiatry: Judgement and insight appear normal. Mood & affect appropriate.                Diet Orders (From admission, onward)     Start     Ordered   06/30/23 0511  Diet regular Room service appropriate? Yes; Fluid consistency: Thin  Diet effective now       Question Answer Comment  Room service appropriate? Yes   Fluid consistency: Thin      06/30/23 0512            Objective: Vitals:   06/30/23 0326 06/30/23 0400 06/30/23 0524 06/30/23 0931  BP:  134/64 125/81 125/81  Pulse:  78 74   Resp:  13 18   Temp:   99.7 F (37.6 C)   TempSrc:   Oral   SpO2: 94% 96% 96%   Weight:      Height:        Intake/Output Summary (Last 24 hours) at 06/30/2023 1133 Last data filed at 06/30/2023 0418 Gross per 24 hour  Intake 100.66 ml  Output --  Net 100.66 ml   Filed Weights   06/30/23 0046  Weight: 90.7 kg    Scheduled Meds:  amLODipine  5 mg Oral Daily  atorvastatin  40 mg Oral QHS   [START ON 07/01/2023] azithromycin  500 mg Oral Daily   citalopram  20 mg Oral Daily   enoxaparin (LOVENOX) injection  40 mg Subcutaneous Q24H   fenofibrate  160 mg Oral Daily   ferrous sulfate  325 mg Oral Q breakfast   glimepiride  0.5 mg Oral BID   insulin aspart  0-5 Units Subcutaneous QHS   insulin aspart  0-6 Units Subcutaneous TID WC   montelukast  10 mg Oral QHS   pantoprazole  40 mg Oral Daily   sodium chloride flush  3 mL Intravenous Q12H   Continuous Infusions:  [START ON 07/01/2023] cefTRIAXone (ROCEPHIN)  IV     magnesium sulfate bolus IVPB      potassium PHOSPHATE IVPB (in mmol)      Nutritional status     Body mass index is 36.58 kg/m.  Data Reviewed:   CBC: Recent Labs  Lab 06/28/23 2308 06/30/23 0103  WBC 19.6* 20.1*  NEUTROABS 16.3* 16.7*  HGB 14.6 13.0  HCT 43.2 37.6  MCV 88.7 89.5  PLT 309 266   Basic Metabolic Panel: Recent Labs  Lab 06/28/23 2308 06/30/23 0103 06/30/23 0804  NA 137 133* 134*  K 3.7 2.9* 3.3*  CL 100 99 101  CO2 23 22 20*  GLUCOSE 190* 211* 159*  BUN 20 19 17   CREATININE 1.08* 1.02* 0.72  CALCIUM 9.7 9.3 9.2  MG  --   --  1.6*  PHOS  --   --  1.9*   GFR: Estimated Creatinine Clearance: 69.5 mL/min (by C-G formula based on SCr of 0.72 mg/dL). Liver Function Tests: Recent Labs  Lab 06/28/23 2308  AST 31  ALT 34  ALKPHOS 41  BILITOT 0.9  PROT 8.1  ALBUMIN 4.3   No results for input(s): "LIPASE", "AMYLASE" in the last 168 hours. No results for input(s): "AMMONIA" in the last 168 hours. Coagulation Profile: No results for input(s): "INR", "PROTIME" in the last 168 hours. Cardiac Enzymes: No results for input(s): "CKTOTAL", "CKMB", "CKMBINDEX", "TROPONINI" in the last 168 hours. BNP (last 3 results) No results for input(s): "PROBNP" in the last 8760 hours. HbA1C: No results for input(s): "HGBA1C" in the last 72 hours. CBG: Recent Labs  Lab 06/29/23 0245 06/30/23 0850  GLUCAP 204* 183*   Lipid Profile: No results for input(s): "CHOL", "HDL", "LDLCALC", "TRIG", "CHOLHDL", "LDLDIRECT" in the last 72 hours. Thyroid Function Tests: No results for input(s): "TSH", "T4TOTAL", "FREET4", "T3FREE", "THYROIDAB" in the last 72 hours. Anemia Panel: No results for input(s): "VITAMINB12", "FOLATE", "FERRITIN", "TIBC", "IRON", "RETICCTPCT" in the last 72 hours. Sepsis Labs: No results for input(s): "PROCALCITON", "LATICACIDVEN" in the last 168 hours.  Recent Results (from the past 240 hours)  Resp panel by RT-PCR (RSV, Flu A&B, Covid) Anterior Nasal Swab     Status: None    Collection Time: 06/28/23  7:16 PM   Specimen: Anterior Nasal Swab  Result Value Ref Range Status   SARS Coronavirus 2 by RT PCR NEGATIVE NEGATIVE Final    Comment: (NOTE) SARS-CoV-2 target nucleic acids are NOT DETECTED.  The SARS-CoV-2 RNA is generally detectable in upper respiratory specimens during the acute phase of infection. The lowest concentration of SARS-CoV-2 viral copies this assay can detect is 138 copies/mL. A negative result does not preclude SARS-Cov-2 infection and should not be used as the sole basis for treatment or other patient management decisions. A negative result may occur with  improper specimen collection/handling, submission of  specimen other than nasopharyngeal swab, presence of viral mutation(s) within the areas targeted by this assay, and inadequate number of viral copies(<138 copies/mL). A negative result must be combined with clinical observations, patient history, and epidemiological information. The expected result is Negative.  Fact Sheet for Patients:  BloggerCourse.com  Fact Sheet for Healthcare Providers:  SeriousBroker.it  This test is no t yet approved or cleared by the [place] FDA and  has been authorized for detection and/or diagnosis of SARS-CoV-2 by FDA under an Emergency Use Authorization (EUA). This EUA will remain  in effect (meaning this test can be used) for the duration of the COVID-19 declaration under Section 564(b)(1) of the Act, 21 U.S.C.section 360bbb-3(b)(1), unless the authorization is terminated  or revoked sooner.       Influenza A by PCR NEGATIVE NEGATIVE Final   Influenza B by PCR NEGATIVE NEGATIVE Final    Comment: (NOTE) The Xpert Xpress SARS-CoV-2/FLU/RSV plus assay is intended as an aid in the diagnosis of influenza from Nasopharyngeal swab specimens and should not be used as a sole basis for treatment. Nasal washings and aspirates are unacceptable for  Xpert Xpress SARS-CoV-2/FLU/RSV testing.  Fact Sheet for Patients: BloggerCourse.com  Fact Sheet for Healthcare Providers: SeriousBroker.it  This test is not yet approved or cleared by the [place] FDA and has been authorized for detection and/or diagnosis of SARS-CoV-2 by FDA under an Emergency Use Authorization (EUA). This EUA will remain in effect (meaning this test can be used) for the duration of the COVID-19 declaration under Section 564(b)(1) of the Act, 21 U.S.C. section 360bbb-3(b)(1), unless the authorization is terminated or revoked.     Resp Syncytial Virus by PCR NEGATIVE NEGATIVE Final    Comment: (NOTE) Fact Sheet for Patients: BloggerCourse.com  Fact Sheet for Healthcare Providers: SeriousBroker.it  This test is not yet approved or cleared by the [place] FDA and has been authorized for detection and/or diagnosis of SARS-CoV-2 by FDA under an Emergency Use Authorization (EUA). This EUA will remain in effect (meaning this test can be used) for the duration of the COVID-19 declaration under Section 564(b)(1) of the Act, 21 U.S.C. section 360bbb-3(b)(1), unless the authorization is terminated or revoked.  Performed at [facility], [address], [place], [place] [address]          Radiology Studies: CT Angio Chest PE W and/or Wo Contrast Result Date: 06/30/2023 CLINICAL DATA:  Syncope or presyncope with cerebrovascular cause suspected. Increasing pain to the left lung and ribs. Worsening shortness of breath. Diagnosed yesterday with pneumonia. EXAM: CT ANGIOGRAPHY CHEST WITH CONTRAST TECHNIQUE: Multidetector CT imaging of the chest was performed using the standard protocol during bolus administration of intravenous contrast. Multiplanar CT image reconstructions and MIPs were obtained to evaluate the vascular anatomy. RADIATION DOSE  REDUCTION: This exam was performed according to the departmental dose-optimization program which includes automated exposure control, adjustment of the mA and/or kV according to patient size and/or use of iterative reconstruction technique. CONTRAST:  75mL OMNIPAQUE IOHEXOL 350 MG/ML SOLN COMPARISON:  Chest radiograph 06/30/2023. FINDINGS: Cardiovascular: Technically adequate study with good opacification of the central and segmental pulmonary arteries. No focal filling defects. No evidence of significant pulmonary embolus. Normal heart size. No pericardial effusions. Normal caliber thoracic aorta. No aortic dissection. Minimal aortic and coronary artery calcifications. Mediastinum/Nodes: Moderate esophageal hiatal hernia. Esophagus is decompressed. No significant lymphadenopathy. Thyroid gland is unremarkable. Lungs/Pleura: Dense consolidation in the left lower lung likely representing lobar pneumonia. Right lung is  clear. No pleural effusion or pneumothorax. Upper Abdomen: Diffuse fatty infiltration of the liver. No acute changes. Musculoskeletal: No chest wall abnormality. No acute or significant osseous findings. Review of the MIP images confirms the above findings. IMPRESSION: 1. No evidence of significant pulmonary embolus. 2. Left lower lung consolidation likely representing lobar pneumonia. Follow-up to resolution is recommended to exclude underlying obstructing lesion. 3. Esophageal hiatal hernia. 4. Fatty infiltration of the liver. 5. Minimal aortic atherosclerosis. Electronically Signed   By: Burman Nieves M.D.   On: 06/30/2023 03:59   DG Chest 2 View Result Date: 06/30/2023 CLINICAL DATA:  Chest pain shortness of breath EXAM: CHEST - 2 VIEW COMPARISON:  06/28/2023 FINDINGS: Increasing left basilar airspace opacity is noted. Small left pleural effusion is now noted. Cardiac shadow is stable. Right lung remains clear. IMPRESSION: Increasing left lower lobe infiltrate with associated small effusion.  Electronically Signed   By: Alcide Clever M.D.   On: 06/30/2023 02:32   DG Chest 2 View Result Date: 06/28/2023 CLINICAL DATA:  Cough Recent flu that turned into a sinus infection. Facial pressure and headache today, not controlled w/ tylenol. Crackles in left base. Non smoker. Asthmatic. Tested neg twice for strep/rsv/covid. EXAM: CHEST - 2 VIEW COMPARISON:  Chest x-ray 12/31/2021 FINDINGS: The heart and mediastinal contours are within normal limits. Question developing left lower lobe airspace opacity. No pulmonary edema. No pleural effusion. No pneumothorax. No acute osseous abnormality. IMPRESSION: question developing left lower lobe airspace opacity. Electronically Signed   By: Tish Frederickson M.D.   On: 06/28/2023 23:14           LOS: 0 days   Time spent= 35 mins    Miguel Rota, MD [facility]  If 7PM-7AM, please contact night-coverage  06/30/2023, 11:33 AM

## 2023-06-30 NOTE — Progress Notes (Signed)
   06/30/23 2242  BiPAP/CPAP/SIPAP  $ Non-Invasive Home Ventilator  Initial  $ Face Mask Medium Yes  BiPAP/CPAP/SIPAP Pt Type Adult (machine plugged into red outlet)  BiPAP/CPAP/SIPAP Resmed  Mask Type Full face mask  Dentures removed? Not applicable  Mask Size Medium  Respiratory Rate 16 breaths/min  Flow Rate 2 lpm (bled into circuit)  Patient Home Equipment No  Auto Titrate Yes (V-auto  5-20 cm H2O)

## 2023-06-30 NOTE — ED Notes (Signed)
 [facility] RN verified handoff was received and aware that [facility] is in route.

## 2023-06-30 NOTE — Progress Notes (Signed)
   06/30/23 1104  TOC Brief Assessment  Insurance and Status Reviewed  Patient has primary care physician Yes  Home environment has been reviewed Home w/ spouse  Prior level of function: Independent  Prior/Current Home Services No current home services  Social Drivers of Health Review SDOH reviewed no interventions necessary  Readmission risk has been reviewed Yes  Transition of care needs transition of care needs identified, TOC will continue to follow    SDOH Interventions Today    Flowsheet Row Most Recent Value  SDOH Interventions   Food Insecurity Interventions Inpatient TOC, Intervention Not Indicated, Patient Declined

## 2023-07-01 DIAGNOSIS — J189 Pneumonia, unspecified organism: Secondary | ICD-10-CM | POA: Diagnosis not present

## 2023-07-01 LAB — BASIC METABOLIC PANEL
Anion gap: 12 (ref 5–15)
BUN: 18 mg/dL (ref 8–23)
CO2: 22 mmol/L (ref 22–32)
Calcium: 8.7 mg/dL — ABNORMAL LOW (ref 8.9–10.3)
Chloride: 97 mmol/L — ABNORMAL LOW (ref 98–111)
Creatinine, Ser: 0.78 mg/dL (ref 0.44–1.00)
GFR, Estimated: >60 mL/min (ref >60)
Glucose, Bld: 126 mg/dL — ABNORMAL HIGH (ref 70–99)
Potassium: 3.4 mmol/L — ABNORMAL LOW (ref 3.5–5.1)
Sodium: 131 mmol/L — ABNORMAL LOW (ref 135–145)

## 2023-07-01 LAB — CBC
HCT: 36.9 % (ref 36.0–46.0)
Hemoglobin: 11.6 g/dL — ABNORMAL LOW (ref 12.0–15.0)
MCH: 30.2 pg (ref 26.0–34.0)
MCHC: 31.4 g/dL (ref 30.0–36.0)
MCV: 96.1 fL (ref 80.0–100.0)
Platelets: 260 10*3/uL (ref 150–400)
RBC: 3.84 MIL/uL — ABNORMAL LOW (ref 3.87–5.11)
RDW: 13.4 % (ref 11.5–15.5)
WBC: 16.4 10*3/uL — ABNORMAL HIGH (ref 4.0–10.5)
nRBC: 0 % (ref 0.0–0.2)

## 2023-07-01 LAB — HIV ANTIBODY (ROUTINE TESTING W REFLEX): HIV Screen 4th Generation wRfx: NONREACTIVE

## 2023-07-01 LAB — GLUCOSE, CAPILLARY
Glucose-Capillary: 103 mg/dL — ABNORMAL HIGH (ref 70–99)
Glucose-Capillary: 145 mg/dL — ABNORMAL HIGH (ref 70–99)
Glucose-Capillary: 155 mg/dL — ABNORMAL HIGH (ref 70–99)
Glucose-Capillary: 206 mg/dL — ABNORMAL HIGH (ref 70–99)

## 2023-07-01 LAB — MAGNESIUM: Magnesium: 2.2 mg/dL (ref 1.7–2.4)

## 2023-07-01 LAB — PHOSPHORUS: Phosphorus: 1.9 mg/dL — ABNORMAL LOW (ref 2.5–4.6)

## 2023-07-01 LAB — HEMOGLOBIN A1C
Hgb A1c MFr Bld: 8.1 % — ABNORMAL HIGH (ref 4.8–5.6)
Mean Plasma Glucose: 185.77 mg/dL

## 2023-07-01 MED ORDER — K PHOS MONO-SOD PHOS DI & MONO 155-852-130 MG PO TABS
500.0000 mg | ORAL_TABLET | Freq: Once | ORAL | Status: AC
  Administered 2023-07-01: 500 mg via ORAL
  Filled 2023-07-01: qty 2

## 2023-07-01 MED ORDER — REVEFENACIN 175 MCG/3ML IN SOLN
175.0000 ug | Freq: Every day | RESPIRATORY_TRACT | Status: DC
  Administered 2023-07-02 – 2023-07-12 (×11): 175 ug via RESPIRATORY_TRACT
  Filled 2023-07-01 (×11): qty 3

## 2023-07-01 MED ORDER — POTASSIUM CHLORIDE CRYS ER 20 MEQ PO TBCR
40.0000 meq | EXTENDED_RELEASE_TABLET | Freq: Once | ORAL | Status: AC
  Administered 2023-07-01: 40 meq via ORAL
  Filled 2023-07-01: qty 2

## 2023-07-01 MED ORDER — ARFORMOTEROL TARTRATE 15 MCG/2ML IN NEBU
15.0000 ug | INHALATION_SOLUTION | Freq: Two times a day (BID) | RESPIRATORY_TRACT | Status: DC
  Administered 2023-07-01 – 2023-07-12 (×23): 15 ug via RESPIRATORY_TRACT
  Filled 2023-07-01 (×23): qty 2

## 2023-07-01 NOTE — Plan of Care (Signed)

## 2023-07-01 NOTE — Evaluation (Addendum)
 Physical Therapy Evaluation Patient Details Name: Jade Perez MRN: [id number] DOB: 1955/08/24 Today's Date: 07/01/2023  History of Present Illness  68 yo female admitted with Pna. Hx of DM, OSA, depression, asthma, NSTEMI, RCR  Clinical Impression  On eval, pt was Supv-CGA for mobility. She walked ~100 feet while pushing IV pole. O2: 93% on RA at rest, 90% on RA with ambulation. Dyspnea 2/4. Pt fatigues fairly easily. Placed pt back on room [place] O2 at end of session. Cues for pt to take rest breaks and deep breaths as necessary. Will plan to follow and progress activity as tolerated. Do not anticipate any f/u PT needs after discharge at this time. Recommend daily hallway ambulation with nursing/mobility team/family as able.       If plan is discharge home, recommend the following: A little help with walking and/or transfers;A little help with bathing/dressing/bathroom;Assistance with cooking/housework;Assist for transportation;Help with stairs or ramp for entrance   Can travel by private vehicle        Equipment Recommendations None recommended by PT  Recommendations for Other Services       Functional Status Assessment Patient has had a recent decline in their functional status and demonstrates the ability to make significant improvements in function in a reasonable and predictable amount of time.     Precautions / Restrictions Precautions Precautions: Fall Precaution/Restrictions Comments: monitor O2 Restrictions Weight Bearing Restrictions Per Provider Order: No      Mobility  Bed Mobility Overal bed mobility: Needs Assistance Bed Mobility: Supine to Sit, Sit to Supine     Supine to sit: Supervision, HOB elevated, Used rails Sit to supine: Supervision, HOB elevated, Used rails   General bed mobility comments: Increaesed time. Cues for rest breaks and deep breathing as needed.    Transfers Overall transfer level: Needs assistance Equipment used: None Transfers:  Sit to/from Stand Sit to Stand: Contact guard assist           General transfer comment: Increased time.    Ambulation/Gait Ambulation/Gait assistance: Contact guard assist Gait Distance (Feet): 100 Feet Assistive device: IV Pole Gait Pattern/deviations: Step-through pattern, Decreased stride length       General Gait Details: Slow, guarded gait with dyspnea and fatigue reported. O2 91% on RA, dyspnea 2/4. 1 brief standing rest break taken after ~60 feet.  Stairs            Wheelchair Mobility     Tilt Bed    Modified Rankin (Stroke Patients Only)       Balance Overall balance assessment: Needs assistance         Standing balance support: During functional activity Standing balance-Leahy Scale: Fair                               Pertinent Vitals/Pain Pain Assessment Pain Assessment: 0-10 Pain Score: 5  Pain Location: L side Pain Intervention(s): Limited activity within patient's tolerance, Monitored during session, Repositioned    Home Living Family/patient expects to be discharged to:: Private residence Living Arrangements: Spouse/significant other Available Help at Discharge: Family Type of Home: House Home Access: Stairs to enter       Home Layout: One level Home Equipment: [place] Mor - single point      Prior Function Prior Level of Function : Independent/Modified Independent                     Extremity/Trunk Assessment   Upper Extremity  Assessment Upper Extremity Assessment: Overall WFL for tasks assessed    Lower Extremity Assessment Lower Extremity Assessment: Generalized weakness    Cervical / Trunk Assessment Cervical / Trunk Assessment: Normal  Communication   Communication Communication: No apparent difficulties    Cognition Arousal: Alert Behavior During Therapy: WFL for tasks assessed/performed   PT - Cognitive impairments: No apparent impairments                         Following  commands: Intact       Cueing Cueing Techniques: Verbal cues     General Comments      Exercises     Assessment/Plan    PT Assessment Patient needs continued PT services  PT Problem List Decreased strength;Decreased range of motion;Decreased activity tolerance;Decreased balance;Decreased mobility;Decreased knowledge of use of DME       PT Treatment Interventions DME instruction;Gait training;Functional mobility training;Therapeutic activities;Therapeutic exercise;Patient/family education;Balance training    PT Goals (Current goals can be found in the Care Plan section)  Acute Rehab PT Goals Patient Stated Goal: to get better and regain PLOF PT Goal Formulation: With patient/family Time For Goal Achievement: 07/15/23 Potential to Achieve Goals: Good    Frequency Min 1X/week     Co-evaluation               AM-PAC PT "6 Clicks" Mobility  Outcome Measure Help needed turning from your back to your side while in a flat bed without using bedrails?: None Help needed moving from lying on your back to sitting on the side of a flat bed without using bedrails?: None Help needed moving to and from a bed to a chair (including a wheelchair)?: A Little Help needed standing up from a chair using your arms (e.g., wheelchair or bedside chair)?: None Help needed to walk in hospital room?: A Little Help needed climbing 3-5 steps with a railing? : A Little 6 Click Score: 21    End of Session   Activity Tolerance: Patient tolerated treatment well;Patient limited by fatigue Patient left: in bed;with call bell/phone within reach;with family/visitor present   PT Visit Diagnosis: Difficulty in walking, not elsewhere classified (R26.2)    Time: 1610-9604 PT Time Calculation (min) (ACUTE ONLY): 30 min   Charges:   PT Evaluation $PT Eval Low Complexity: 1 Low PT Treatments $Gait Training: 8-22 mins PT General Charges $$ ACUTE PT VISIT: 1 Visit          Faye Ramsay, PT Acute  Rehabilitation  Office: [phone]

## 2023-07-01 NOTE — Progress Notes (Signed)
 PROGRESS NOTE    Jade Perez  GNF:[id number] DOB: [birth year] DOA: 06/30/2023 PCP: Abner Greenspan, MD    Brief Narrative:  68 year old with history of HTN, HLD, DM2, OSA, depression, asthma, NSTEMI with normal coronaries admitted for shortness of breath and cough.  Outpatient diagnosed of left lower lobe pneumonia on 3/22 but returns due to worsening of her pain and discomfort.  CTA chest negative for PE but does show left lower lobe consolidation started on Rocephin and azithromycin.   Assessment & Plan:  Principal Problem:   CAP (community acquired pneumonia) Active Problems:   Intrinsic asthma   Type 2 diabetes mellitus without complication, without long-term current use of insulin (HCC)   Acute respiratory failure with hypoxia (HCC)   Hypokalemia   Hypertension   Depression   OSA (obstructive sleep apnea)   Hyperlipidemia, mixed    Left lower lobe community-acquired pneumonia Failed outpatient therapy.  Leukocytosis improving.  CTA chest is negative for PE but showed left lower lobe consolidation.  Continue bronchodilators, I-S/flutter valve.  On Rocephin/azithromycin. -Will need repeat chest x-ray or CT scan in about 8 weeks   Hypokalemia/hypophosphatemia Replete as needed   Essential hypertension Norvasc.  IV as needed   Hyperlipidemia Statin   Diabetes mellitus type 2 - A1c was 6.5% in September 2023  -Holding home Ozempic.  Continue Amaryl Sliding scale and Accu-Cheks   History of asthma and OSA Bronchodilators   Depression - Celexa    Smudge cells - Smudge cells noted on peripheral smear, percentage not specified  - Could be d/t the infection but she will need further workup with flow cytometry if leukocytosis persists   PT/OT- Ambulatory pulse ox  DVT prophylaxis: Lovenox    Code Status: Full Code Family Communication: Husband at bedside Status is: Inpatient Remains inpatient appropriate because: Continue hospital side for abnormal breath  sounds    Subjective: Still having significant pleuritic pain and dyspnea with minimal exertion Examination:  General exam: Appears calm and comfortable  Respiratory system: Mild bilateral rhonchi Cardiovascular system: S1 & S2 heard, RRR. No JVD, murmurs, rubs, gallops or clicks. No pedal edema. Gastrointestinal system: Abdomen is nondistended, soft and nontender. No organomegaly or masses felt. Normal bowel sounds heard. Central nervous system: Alert and oriented. No focal neurological deficits. Extremities: Symmetric 5 x 5 power. Skin: No rashes, lesions or ulcers Psychiatry: Judgement and insight appear normal. Mood & affect appropriate.                Diet Orders (From admission, onward)     Start     Ordered   06/30/23 0511  Diet regular Room service appropriate? Yes; Fluid consistency: Thin  Diet effective now       Question Answer Comment  Room service appropriate? Yes   Fluid consistency: Thin      06/30/23 0512            Objective: Vitals:   06/30/23 0931 06/30/23 1358 06/30/23 2046 07/01/23 0445  BP: 125/81 (!) 142/102 136/69 (!) 145/70  Pulse:  72 68 79  Resp:  16 18 18   Temp:  98.8 F (37.1 C) 99 F (37.2 C) 99.1 F (37.3 C)  TempSrc:  Oral Oral Oral  SpO2:  94% 95% 97%  Weight:      Height:        Intake/Output Summary (Last 24 hours) at 07/01/2023 1232 Last data filed at 07/01/2023 0502 Gross per 24 hour  Intake 100 ml  Output 1000 ml  Net -  900 ml   Filed Weights   06/30/23 0046  Weight: 90.7 kg    Scheduled Meds:  amLODipine  5 mg Oral Daily   arformoterol  15 mcg Nebulization BID   atorvastatin  40 mg Oral QHS   azithromycin  500 mg Oral Daily   citalopram  20 mg Oral Daily   enoxaparin (LOVENOX) injection  40 mg Subcutaneous Q24H   fenofibrate  160 mg Oral Daily   ferrous sulfate  325 mg Oral Q breakfast   glimepiride  0.5 mg Oral BID   insulin aspart  0-5 Units Subcutaneous QHS   insulin aspart  0-6 Units  Subcutaneous TID WC   montelukast  10 mg Oral QHS   pantoprazole  40 mg Oral Daily   revefenacin  175 mcg Nebulization Daily   sodium chloride flush  3 mL Intravenous Q12H   Continuous Infusions:  cefTRIAXone (ROCEPHIN)  IV 2 g (07/01/23 0155)    Nutritional status     Body mass index is 36.58 kg/m.  Data Reviewed:   CBC: Recent Labs  Lab 06/28/23 2308 06/30/23 0103 07/01/23 0550  WBC 19.6* 20.1* 16.4*  NEUTROABS 16.3* 16.7*  --   HGB 14.6 13.0 11.6*  HCT 43.2 37.6 36.9  MCV 88.7 89.5 96.1  PLT 309 266 260   Basic Metabolic Panel: Recent Labs  Lab 06/28/23 2308 06/30/23 0103 06/30/23 0804 07/01/23 0550  NA 137 133* 134* 131*  K 3.7 2.9* 3.3* 3.4*  CL 100 99 101 97*  CO2 23 22 20* 22  GLUCOSE 190* 211* 159* 126*  BUN 20 19 17 18   CREATININE 1.08* 1.02* 0.72 0.78  CALCIUM 9.7 9.3 9.2 8.7*  MG  --   --  1.6* 2.2  PHOS  --   --  1.9* 1.9*   GFR: Estimated Creatinine Clearance: 69.5 mL/min (by C-G formula based on SCr of 0.78 mg/dL). Liver Function Tests: Recent Labs  Lab 06/28/23 2308  AST 31  ALT 34  ALKPHOS 41  BILITOT 0.9  PROT 8.1  ALBUMIN 4.3   No results for input(s): "LIPASE", "AMYLASE" in the last 168 hours. No results for input(s): "AMMONIA" in the last 168 hours. Coagulation Profile: No results for input(s): "INR", "PROTIME" in the last 168 hours. Cardiac Enzymes: No results for input(s): "CKTOTAL", "CKMB", "CKMBINDEX", "TROPONINI" in the last 168 hours. BNP (last 3 results) No results for input(s): "PROBNP" in the last 8760 hours. HbA1C: Recent Labs    07/01/23 0550  HGBA1C 8.1*   CBG: Recent Labs  Lab 06/30/23 1151 06/30/23 1618 06/30/23 2319 07/01/23 0811 07/01/23 1219  GLUCAP 170* 189* 147* 103* 145*   Lipid Profile: No results for input(s): "CHOL", "HDL", "LDLCALC", "TRIG", "CHOLHDL", "LDLDIRECT" in the last 72 hours. Thyroid Function Tests: No results for input(s): "TSH", "T4TOTAL", "FREET4", "T3FREE", "THYROIDAB" in  the last 72 hours. Anemia Panel: No results for input(s): "VITAMINB12", "FOLATE", "FERRITIN", "TIBC", "IRON", "RETICCTPCT" in the last 72 hours. Sepsis Labs: No results for input(s): "PROCALCITON", "LATICACIDVEN" in the last 168 hours.  Recent Results (from the past 240 hours)  Resp panel by RT-PCR (RSV, Flu A&B, Covid) Anterior Nasal Swab     Status: None   Collection Time: 06/28/23  7:16 PM   Specimen: Anterior Nasal Swab  Result Value Ref Range Status   SARS Coronavirus 2 by RT PCR NEGATIVE NEGATIVE Final    Comment: (NOTE) SARS-CoV-2 target nucleic acids are NOT DETECTED.  The SARS-CoV-2 RNA is generally detectable in upper respiratory specimens  during the acute phase of infection. The lowest concentration of SARS-CoV-2 viral copies this assay can detect is 138 copies/mL. A negative result does not preclude SARS-Cov-2 infection and should not be used as the sole basis for treatment or other patient management decisions. A negative result may occur with  improper specimen collection/handling, submission of specimen other than nasopharyngeal swab, presence of viral mutation(s) within the areas targeted by this assay, and inadequate number of viral copies(<138 copies/mL). A negative result must be combined with clinical observations, patient history, and epidemiological information. The expected result is Negative.  Fact Sheet for Patients:  BloggerCourse.com  Fact Sheet for Healthcare Providers:  SeriousBroker.it  This test is no t yet approved or cleared by the [place] FDA and  has been authorized for detection and/or diagnosis of SARS-CoV-2 by FDA under an Emergency Use Authorization (EUA). This EUA will remain  in effect (meaning this test can be used) for the duration of the COVID-19 declaration under Section 564(b)(1) of the Act, 21 U.S.C.section 360bbb-3(b)(1), unless the authorization is terminated  or revoked  sooner.       Influenza A by PCR NEGATIVE NEGATIVE Final   Influenza B by PCR NEGATIVE NEGATIVE Final    Comment: (NOTE) The Xpert Xpress SARS-CoV-2/FLU/RSV plus assay is intended as an aid in the diagnosis of influenza from Nasopharyngeal swab specimens and should not be used as a sole basis for treatment. Nasal washings and aspirates are unacceptable for Xpert Xpress SARS-CoV-2/FLU/RSV testing.  Fact Sheet for Patients: BloggerCourse.com  Fact Sheet for Healthcare Providers: SeriousBroker.it  This test is not yet approved or cleared by the [place] FDA and has been authorized for detection and/or diagnosis of SARS-CoV-2 by FDA under an Emergency Use Authorization (EUA). This EUA will remain in effect (meaning this test can be used) for the duration of the COVID-19 declaration under Section 564(b)(1) of the Act, 21 U.S.C. section 360bbb-3(b)(1), unless the authorization is terminated or revoked.     Resp Syncytial Virus by PCR NEGATIVE NEGATIVE Final    Comment: (NOTE) Fact Sheet for Patients: BloggerCourse.com  Fact Sheet for Healthcare Providers: SeriousBroker.it  This test is not yet approved or cleared by the [place] FDA and has been authorized for detection and/or diagnosis of SARS-CoV-2 by FDA under an Emergency Use Authorization (EUA). This EUA will remain in effect (meaning this test can be used) for the duration of the COVID-19 declaration under Section 564(b)(1) of the Act, 21 U.S.C. section 360bbb-3(b)(1), unless the authorization is terminated or revoked.  Performed at [facility], [address] [place], [place] [address]   Blood culture (routine x 2)     Status: None (Preliminary result)   Collection Time: 06/30/23  1:03 AM   Specimen: BLOOD  Result Value Ref Range Status   Specimen Description   Final    BLOOD LEFT  ANTECUBITAL Performed at [facility], [address] [place], [place] [address]    Special Requests   Final    BOTTLES DRAWN AEROBIC AND ANAEROBIC Blood Culture adequate volume Performed at [facility], [address] [place], [place] [address]    Culture   Final    NO GROWTH 1 DAY Performed at [facility], [address] [place], [place] [address]    Report Status PENDING  Incomplete  Blood culture (routine x 2)     Status: None (Preliminary result)   Collection Time: 06/30/23  1:03 AM  Specimen: BLOOD RIGHT FOREARM  Result Value Ref Range Status   Specimen Description   Final    BLOOD RIGHT FOREARM Performed at [facility], [address] [place], [place] [address]    Special Requests   Final    BOTTLES DRAWN AEROBIC AND ANAEROBIC Blood Culture adequate volume Performed at [facility], [address] [place], [place] [address]    Culture   Final    NO GROWTH 1 DAY Performed at [facility], [address], [place], [place] [address]    Report Status PENDING  Incomplete         Radiology Studies: CT Angio Chest PE W and/or Wo Contrast Result Date: 06/30/2023 CLINICAL DATA:  Syncope or presyncope with cerebrovascular cause suspected. Increasing pain to the left lung and ribs. Worsening shortness of breath. Diagnosed yesterday with pneumonia. EXAM: CT ANGIOGRAPHY CHEST WITH CONTRAST TECHNIQUE: Multidetector CT imaging of the chest was performed using the standard protocol during bolus administration of intravenous contrast. Multiplanar CT image reconstructions and MIPs were obtained to evaluate the vascular anatomy. RADIATION DOSE REDUCTION: This exam was performed according to the departmental dose-optimization program which includes automated exposure control, adjustment of the mA and/or kV according to patient size and/or use of iterative reconstruction technique. CONTRAST:  75mL OMNIPAQUE IOHEXOL 350 MG/ML SOLN  COMPARISON:  Chest radiograph 06/30/2023. FINDINGS: Cardiovascular: Technically adequate study with good opacification of the central and segmental pulmonary arteries. No focal filling defects. No evidence of significant pulmonary embolus. Normal heart size. No pericardial effusions. Normal caliber thoracic aorta. No aortic dissection. Minimal aortic and coronary artery calcifications. Mediastinum/Nodes: Moderate esophageal hiatal hernia. Esophagus is decompressed. No significant lymphadenopathy. Thyroid gland is unremarkable. Lungs/Pleura: Dense consolidation in the left lower lung likely representing lobar pneumonia. Right lung is clear. No pleural effusion or pneumothorax. Upper Abdomen: Diffuse fatty infiltration of the liver. No acute changes. Musculoskeletal: No chest wall abnormality. No acute or significant osseous findings. Review of the MIP images confirms the above findings. IMPRESSION: 1. No evidence of significant pulmonary embolus. 2. Left lower lung consolidation likely representing lobar pneumonia. Follow-up to resolution is recommended to exclude underlying obstructing lesion. 3. Esophageal hiatal hernia. 4. Fatty infiltration of the liver. 5. Minimal aortic atherosclerosis. Electronically Signed   By: Burman Nieves M.D.   On: 06/30/2023 03:59   DG Chest 2 View Result Date: 06/30/2023 CLINICAL DATA:  Chest pain shortness of breath EXAM: CHEST - 2 VIEW COMPARISON:  06/28/2023 FINDINGS: Increasing left basilar airspace opacity is noted. Small left pleural effusion is now noted. Cardiac shadow is stable. Right lung remains clear. IMPRESSION: Increasing left lower lobe infiltrate with associated small effusion. Electronically Signed   By: Alcide Clever M.D.   On: 06/30/2023 02:32           LOS: 1 day   Time spent= 35 mins    Miguel Rota, MD [facility]  If 7PM-7AM, please contact night-coverage  07/01/2023, 12:32 PM

## 2023-07-01 NOTE — Progress Notes (Signed)
 SATURATION QUALIFICATIONS: (This note is used to comply with regulatory documentation for home oxygen)  Patient Saturations on Room Air at Rest = 93%  Patient Saturations on Room Air while Ambulating = 90%  Patient Saturations on 1 Liters of oxygen while Ambulating = 100%

## 2023-07-01 NOTE — Progress Notes (Signed)
   07/01/23 2247  BiPAP/CPAP/SIPAP  BiPAP/CPAP/SIPAP Pt Type Adult  BiPAP/CPAP/SIPAP Resmed  Mask Type Full face mask  Dentures removed? Not applicable  Mask Size Medium  Respiratory Rate 16 breaths/min  Flow Rate 2 lpm (bled into circuit)  Patient Home Machine No  Auto Titrate Yes  Minimum cmH2O 5 cmH2O  Maximum cmH2O 20 cmH2O  CPAP/SIPAP surface wiped down Yes  Device Plugged into RED Power Outlet Yes

## 2023-07-01 NOTE — Evaluation (Signed)
 Occupational Therapy Evaluation Patient Details Name: Jade Perez MRN: [id number] DOB: [birth year] Today's Date: 07/01/2023   History of Present Illness   68 yo female admitted with Pna. Hx of DM, OSA, depression, asthma, NSTEMI, RCR     Clinical Impressions Pt presents with decline in function and safety with ADLs and ADL mobility with impaired strength and endurance. PTA pt lives with her husband, daughter ad grandchild; pt was Ind with ADLs/ IADLs, home mgt, was driving, working part time and used no AD for mobility. Pt currently required CGA/Sup with LB ADLs, shower transfers, no AD for mobility. Pt educated on deep breathing, O2 SATs dropped to 85% during in room activity on RA. Pt would benefit from acute OT services to address impairments to maximize level of function and safety     If plan is discharge home, recommend the following:   A little help with bathing/dressing/bathroom;Assistance with cooking/housework;Assist for transportation     Functional Status Assessment   Patient has had a recent decline in their functional status and demonstrates the ability to make significant improvements in function in a reasonable and predictable amount of time.     Equipment Recommendations   Tub/shower seat     Recommendations for Other Services         Precautions/Restrictions   Precautions Precautions: Fall Precaution/Restrictions Comments: monitor O2 Restrictions Weight Bearing Restrictions Per Provider Order: No     Mobility Bed Mobility Overal bed mobility: Needs Assistance Bed Mobility: Supine to Sit, Sit to Supine     Supine to sit: Supervision, HOB elevated, Used rails Sit to supine: Supervision, HOB elevated, Used rails   General bed mobility comments: Increaesed time. Cues for rest breaks and deep breathing as needed.    Transfers Overall transfer level: Needs assistance Equipment used: None Transfers: Sit to/from Stand Sit to Stand:  Contact guard assist                  Balance Overall balance assessment: Needs assistance Sitting-balance support: No upper extremity supported, Feet supported Sitting balance-Leahy Scale: Good     Standing balance support: During functional activity Standing balance-Leahy Scale: Fair                             ADL either performed or assessed with clinical judgement   ADL Overall ADL's : Needs assistance/impaired Eating/Feeding: Independent   Grooming: Wash/dry hands;Wash/dry face;Supervision/safety;Standing   Upper Body Bathing: Set up   Lower Body Bathing: Contact guard assist   Upper Body Dressing : Set up   Lower Body Dressing: Contact guard assist   Toilet Transfer: Supervision/safety;Ambulation;Regular Social worker and Hygiene: Supervision/safety   Tub/ Optometrist guard assist;Ambulation   Functional mobility during ADLs: Contact guard assist;Supervision/safety General ADL Comments: pt educated on deep breathing, O2 SATs dropped to 85% during in room activity on RA     Vision Baseline Vision/History: 1 Wears glasses Ability to See in Adequate Light: 0 Adequate Patient Visual Report: No change from baseline       Perception         Praxis         Pertinent Vitals/Pain Pain Assessment Pain Assessment: 0-10 Pain Score: 4  Pain Location: L side Pain Descriptors / Indicators: Sore Pain Intervention(s): Monitored during session, Repositioned     Extremity/Trunk Assessment Upper Extremity Assessment Upper Extremity Assessment: Overall WFL for tasks assessed   Lower Extremity Assessment Lower  Extremity Assessment: Defer to PT evaluation   Cervical / Trunk Assessment Cervical / Trunk Assessment: Normal   Communication Communication Communication: No apparent difficulties   Cognition Arousal: Alert Behavior During Therapy: WFL for tasks assessed/performed Cognition: No apparent  impairments                               Following commands: Intact       Cueing  General Comments   Cueing Techniques: Verbal cues      Exercises     Shoulder Instructions      Home Living Family/patient expects to be discharged to:: Private residence Living Arrangements: Spouse/significant other Available Help at Discharge: Family Type of Home: House Home Access: Stairs to enter     Home Layout: One level     Bathroom Shower/Tub: Tub/shower unit;Walk-in shower         Home Equipment: Gilmer Mor - single point          Prior Functioning/Environment Prior Level of Function : Independent/Modified Independent;Driving                    OT Problem List: Decreased activity tolerance;Decreased knowledge of use of DME or AE   OT Treatment/Interventions: Self-care/ADL training;DME and/or AE instruction;Therapeutic activities;Balance training;Patient/family education;Energy conservation      OT Goals(Current goals can be found in the care plan section)   Acute Rehab OT Goals Patient Stated Goal: go home OT Goal Formulation: With patient Time For Goal Achievement: 07/15/23 Potential to Achieve Goals: Good ADL Goals Pt Will Perform Lower Body Bathing: with supervision;with modified independence Pt Will Perform Lower Body Dressing: with supervision;with modified independence Pt Will Perform Tub/Shower Transfer: with supervision;with modified independence;ambulating Additional ADL Goal #1: Pt will perform deep, pursed lip breathing to increase O2 SATs WNL during functional activity Additional ADL Goal #2: Pt will verbalize and demo 3 energy conservation techniques for ADLs and ADL mobility   OT Frequency:  Min 1X/week    Co-evaluation              AM-PAC OT "6 Clicks" Daily Activity     Outcome Measure Help from another person eating meals?: None Help from another person taking care of personal grooming?: A Little Help from another  person toileting, which includes using toliet, bedpan, or urinal?: A Little Help from another person bathing (including washing, rinsing, drying)?: A Little Help from another person to put on and taking off regular upper body clothing?: A Little Help from another person to put on and taking off regular lower body clothing?: A Little 6 Click Score: 19   End of Session Equipment Utilized During Treatment: Gait belt  Activity Tolerance: Patient tolerated treatment well Patient left: in bed;with call bell/phone within reach  OT Visit Diagnosis: Muscle weakness (generalized) (M62.81);Other abnormalities of gait and mobility (R26.89)                Time: 4098-1191 OT Time Calculation (min): 36 min Charges:  OT General Charges $OT Visit: 1 Visit OT Evaluation $OT Eval Low Complexity: 1 Low OT Treatments $Self Care/Home Management : 8-22 mins $Therapeutic Activity: 8-22 mins    Galen Manila 07/01/2023, 3:07 PM

## 2023-07-02 DIAGNOSIS — J189 Pneumonia, unspecified organism: Secondary | ICD-10-CM | POA: Diagnosis not present

## 2023-07-02 LAB — BASIC METABOLIC PANEL
Anion gap: 15 (ref 5–15)
BUN: 16 mg/dL (ref 8–23)
CO2: 20 mmol/L — ABNORMAL LOW (ref 22–32)
Calcium: 8.6 mg/dL — ABNORMAL LOW (ref 8.9–10.3)
Chloride: 97 mmol/L — ABNORMAL LOW (ref 98–111)
Creatinine, Ser: 0.67 mg/dL (ref 0.44–1.00)
GFR, Estimated: >60 mL/min (ref >60)
Glucose, Bld: 155 mg/dL — ABNORMAL HIGH (ref 70–99)
Potassium: 3.6 mmol/L (ref 3.5–5.1)
Sodium: 132 mmol/L — ABNORMAL LOW (ref 135–145)

## 2023-07-02 LAB — CBC
HCT: 36 % (ref 36.0–46.0)
Hemoglobin: 11.8 g/dL — ABNORMAL LOW (ref 12.0–15.0)
MCH: 30.7 pg (ref 26.0–34.0)
MCHC: 32.8 g/dL (ref 30.0–36.0)
MCV: 93.8 fL (ref 80.0–100.0)
Platelets: 321 10*3/uL (ref 150–400)
RBC: 3.84 MIL/uL — ABNORMAL LOW (ref 3.87–5.11)
RDW: 13.5 % (ref 11.5–15.5)
WBC: 13.1 10*3/uL — ABNORMAL HIGH (ref 4.0–10.5)
nRBC: 0 % (ref 0.0–0.2)

## 2023-07-02 LAB — MAGNESIUM: Magnesium: 2.1 mg/dL (ref 1.7–2.4)

## 2023-07-02 LAB — GLUCOSE, CAPILLARY
Glucose-Capillary: 152 mg/dL — ABNORMAL HIGH (ref 70–99)
Glucose-Capillary: 231 mg/dL — ABNORMAL HIGH (ref 70–99)
Glucose-Capillary: 174 mg/dL — ABNORMAL HIGH (ref 70–99)
Glucose-Capillary: 200 mg/dL — ABNORMAL HIGH (ref 70–99)

## 2023-07-02 LAB — EXPECTORATED SPUTUM ASSESSMENT W GRAM STAIN, RFLX TO RESP C

## 2023-07-02 LAB — LEGIONELLA PNEUMOPHILA SEROGP 1 UR AG: L. pneumophila Serogp 1 Ur Ag: NEGATIVE

## 2023-07-02 NOTE — Progress Notes (Signed)
 PROGRESS NOTE    Jade Perez  ZOX:[id number] DOB: [birth year] DOA: 06/30/2023 PCP: Abner Greenspan, MD    Brief Narrative:  68 year old with history of HTN, HLD, DM2, OSA, depression, asthma, NSTEMI with normal coronaries admitted for shortness of breath and cough.  Outpatient diagnosed of left lower lobe pneumonia on 3/22 but returns due to worsening of her pain and discomfort.  CTA chest negative for PE but does show left lower lobe consolidation started on Rocephin and azithromycin.  Patient very slow to improve   Assessment & Plan:  Principal Problem:   CAP (community acquired pneumonia) Active Problems:   Intrinsic asthma   Type 2 diabetes mellitus without complication, without long-term current use of insulin (HCC)   Acute respiratory failure with hypoxia (HCC)   Hypokalemia   Hypertension   Depression   OSA (obstructive sleep apnea)   Hyperlipidemia, mixed    Left lower lobe community-acquired pneumonia Failed outpatient therapy.  Leukocytosis improving.  CTA chest is negative for PE but showed left lower lobe consolidation.  Continue bronchodilators, I-S/flutter valve.  On Rocephin/azithromycin. -Will need repeat chest x-ray or CT scan in about 8 weeks -Ambulatory pulse ox   Hypokalemia/hypophosphatemia Replete as needed   Essential hypertension Norvasc.  IV as needed   Hyperlipidemia Statin   Diabetes mellitus type 2 - A1c was 6.5% in September 2023  -Holding home Ozempic.  Continue Amaryl Sliding scale and Accu-Cheks   History of asthma and OSA Bronchodilators   Depression - Celexa    Smudge cells - Smudge cells noted on peripheral smear, percentage not specified  - Could be d/t the infection but she will need further workup with flow cytometry if leukocytosis persists   PT/OT-no follow-up needed Ambulatory pulse ox  DVT prophylaxis: Lovenox    Code Status: Full Code Family Communication: Husband at bedside Status is: Inpatient Remains  inpatient appropriate because: Continue hospital side for abnormal breath sounds    Subjective: Doing okay and feeling better but with minimal ambulation she is still quite dyspneic on exertion. Examination:  General exam: Appears calm and comfortable  Respiratory system: Mild bilateral rhonchi, improved Cardiovascular system: S1 & S2 heard, RRR. No JVD, murmurs, rubs, gallops or clicks. No pedal edema. Gastrointestinal system: Abdomen is nondistended, soft and nontender. No organomegaly or masses felt. Normal bowel sounds heard. Central nervous system: Alert and oriented. No focal neurological deficits. Extremities: Symmetric 5 x 5 power. Skin: No rashes, lesions or ulcers Psychiatry: Judgement and insight appear normal. Mood & affect appropriate.                Diet Orders (From admission, onward)     Start     Ordered   06/30/23 0511  Diet regular Room service appropriate? Yes; Fluid consistency: Thin  Diet effective now       Question Answer Comment  Room service appropriate? Yes   Fluid consistency: Thin      06/30/23 0512            Objective: Vitals:   07/02/23 0522 07/02/23 0905 07/02/23 0917 07/02/23 1151  BP: 135/70 135/70  138/67  Pulse: 80  80 78  Resp: 16  20   Temp: 98.7 F (37.1 C)   98.3 F (36.8 C)  TempSrc: Oral   Oral  SpO2: 92%  93% 91%  Weight:      Height:        Intake/Output Summary (Last 24 hours) at 07/02/2023 1326 Last data filed at 07/02/2023 8119 Gross  per 24 hour  Intake 440 ml  Output --  Net 440 ml   Filed Weights   06/30/23 0046  Weight: 90.7 kg    Scheduled Meds:  amLODipine  5 mg Oral Daily   arformoterol  15 mcg Nebulization BID   atorvastatin  40 mg Oral QHS   azithromycin  500 mg Oral Daily   citalopram  20 mg Oral Daily   enoxaparin (LOVENOX) injection  40 mg Subcutaneous Q24H   fenofibrate  160 mg Oral Daily   ferrous sulfate  325 mg Oral Q breakfast   glimepiride  0.5 mg Oral BID   insulin aspart   0-5 Units Subcutaneous QHS   insulin aspart  0-6 Units Subcutaneous TID WC   montelukast  10 mg Oral QHS   pantoprazole  40 mg Oral Daily   revefenacin  175 mcg Nebulization Daily   sodium chloride flush  3 mL Intravenous Q12H   Continuous Infusions:  cefTRIAXone (ROCEPHIN)  IV 2 g (07/02/23 0152)    Nutritional status     Body mass index is 36.58 kg/m.  Data Reviewed:   CBC: Recent Labs  Lab 06/28/23 2308 06/30/23 0103 07/01/23 0550 07/02/23 0556  WBC 19.6* 20.1* 16.4* 13.1*  NEUTROABS 16.3* 16.7*  --   --   HGB 14.6 13.0 11.6* 11.8*  HCT 43.2 37.6 36.9 36.0  MCV 88.7 89.5 96.1 93.8  PLT 309 266 260 321   Basic Metabolic Panel: Recent Labs  Lab 06/28/23 2308 06/30/23 0103 06/30/23 0804 07/01/23 0550 07/02/23 0556  NA 137 133* 134* 131* 132*  K 3.7 2.9* 3.3* 3.4* 3.6  CL 100 99 101 97* 97*  CO2 23 22 20* 22 20*  GLUCOSE 190* 211* 159* 126* 155*  BUN 20 19 17 18 16   CREATININE 1.08* 1.02* 0.72 0.78 0.67  CALCIUM 9.7 9.3 9.2 8.7* 8.6*  MG  --   --  1.6* 2.2 2.1  PHOS  --   --  1.9* 1.9*  --    GFR: Estimated Creatinine Clearance: 69.5 mL/min (by C-G formula based on SCr of 0.67 mg/dL). Liver Function Tests: Recent Labs  Lab 06/28/23 2308  AST 31  ALT 34  ALKPHOS 41  BILITOT 0.9  PROT 8.1  ALBUMIN 4.3   No results for input(s): "LIPASE", "AMYLASE" in the last 168 hours. No results for input(s): "AMMONIA" in the last 168 hours. Coagulation Profile: No results for input(s): "INR", "PROTIME" in the last 168 hours. Cardiac Enzymes: No results for input(s): "CKTOTAL", "CKMB", "CKMBINDEX", "TROPONINI" in the last 168 hours. BNP (last 3 results) No results for input(s): "PROBNP" in the last 8760 hours. HbA1C: Recent Labs    07/01/23 0550  HGBA1C 8.1*   CBG: Recent Labs  Lab 07/01/23 1219 07/01/23 1750 07/01/23 2114 07/02/23 0803 07/02/23 1150  GLUCAP 145* 155* 206* 152* 231*   Lipid Profile: No results for input(s): "CHOL", "HDL",  "LDLCALC", "TRIG", "CHOLHDL", "LDLDIRECT" in the last 72 hours. Thyroid Function Tests: No results for input(s): "TSH", "T4TOTAL", "FREET4", "T3FREE", "THYROIDAB" in the last 72 hours. Anemia Panel: No results for input(s): "VITAMINB12", "FOLATE", "FERRITIN", "TIBC", "IRON", "RETICCTPCT" in the last 72 hours. Sepsis Labs: No results for input(s): "PROCALCITON", "LATICACIDVEN" in the last 168 hours.  Recent Results (from the past 240 hours)  Resp panel by RT-PCR (RSV, Flu A&B, Covid) Anterior Nasal Swab     Status: None   Collection Time: 06/28/23  7:16 PM   Specimen: Anterior Nasal Swab  Result Value Ref  Range Status   SARS Coronavirus 2 by RT PCR NEGATIVE NEGATIVE Final    Comment: (NOTE) SARS-CoV-2 target nucleic acids are NOT DETECTED.  The SARS-CoV-2 RNA is generally detectable in upper respiratory specimens during the acute phase of infection. The lowest concentration of SARS-CoV-2 viral copies this assay can detect is 138 copies/mL. A negative result does not preclude SARS-Cov-2 infection and should not be used as the sole basis for treatment or other patient management decisions. A negative result may occur with  improper specimen collection/handling, submission of specimen other than nasopharyngeal swab, presence of viral mutation(s) within the areas targeted by this assay, and inadequate number of viral copies(<138 copies/mL). A negative result must be combined with clinical observations, patient history, and epidemiological information. The expected result is Negative.  Fact Sheet for Patients:  BloggerCourse.com  Fact Sheet for Healthcare Providers:  SeriousBroker.it  This test is no t yet approved or cleared by the [place] FDA and  has been authorized for detection and/or diagnosis of SARS-CoV-2 by FDA under an Emergency Use Authorization (EUA). This EUA will remain  in effect (meaning this test can be used) for  the duration of the COVID-19 declaration under Section 564(b)(1) of the Act, 21 U.S.C.section 360bbb-3(b)(1), unless the authorization is terminated  or revoked sooner.       Influenza A by PCR NEGATIVE NEGATIVE Final   Influenza B by PCR NEGATIVE NEGATIVE Final    Comment: (NOTE) The Xpert Xpress SARS-CoV-2/FLU/RSV plus assay is intended as an aid in the diagnosis of influenza from Nasopharyngeal swab specimens and should not be used as a sole basis for treatment. Nasal washings and aspirates are unacceptable for Xpert Xpress SARS-CoV-2/FLU/RSV testing.  Fact Sheet for Patients: BloggerCourse.com  Fact Sheet for Healthcare Providers: SeriousBroker.it  This test is not yet approved or cleared by the [place] FDA and has been authorized for detection and/or diagnosis of SARS-CoV-2 by FDA under an Emergency Use Authorization (EUA). This EUA will remain in effect (meaning this test can be used) for the duration of the COVID-19 declaration under Section 564(b)(1) of the Act, 21 U.S.C. section 360bbb-3(b)(1), unless the authorization is terminated or revoked.     Resp Syncytial Virus by PCR NEGATIVE NEGATIVE Final    Comment: (NOTE) Fact Sheet for Patients: BloggerCourse.com  Fact Sheet for Healthcare Providers: SeriousBroker.it  This test is not yet approved or cleared by the [place] FDA and has been authorized for detection and/or diagnosis of SARS-CoV-2 by FDA under an Emergency Use Authorization (EUA). This EUA will remain in effect (meaning this test can be used) for the duration of the COVID-19 declaration under Section 564(b)(1) of the Act, 21 U.S.C. section 360bbb-3(b)(1), unless the authorization is terminated or revoked.  Performed at [facility], [address] [place], [place] [address]   Blood culture (routine x 2)     Status: None  (Preliminary result)   Collection Time: 06/30/23  1:03 AM   Specimen: BLOOD  Result Value Ref Range Status   Specimen Description   Final    BLOOD LEFT ANTECUBITAL Performed at [facility], [address] [place], [place] [address]    Special Requests   Final    BOTTLES DRAWN AEROBIC AND ANAEROBIC Blood Culture adequate volume Performed at [facility], [address] [place], [place] [address]    Culture   Final    NO GROWTH 2 DAYS Performed at [facility], [address] [place], [place] [address]    Report Status PENDING  Incomplete  Blood culture (routine x 2)     Status: None (Preliminary result)   Collection Time: 06/30/23  1:03 AM   Specimen: BLOOD RIGHT FOREARM  Result Value Ref Range Status   Specimen Description   Final    BLOOD RIGHT FOREARM Performed at [facility], [address] [place], [place] [address]    Special Requests   Final    BOTTLES DRAWN AEROBIC AND ANAEROBIC Blood Culture adequate volume Performed at [facility], [address] [place], [place] [address]    Culture   Final    NO GROWTH 2 DAYS Performed at [facility], [address] [place], [place] [address]    Report Status PENDING  Incomplete         Radiology Studies: No results found.         LOS: 2 days   Time spent= 35 mins    Miguel Rota, MD [facility]  If 7PM-7AM, please contact night-coverage  07/02/2023, 1:26 PM

## 2023-07-02 NOTE — Progress Notes (Signed)
 SATURATION QUALIFICATIONS: (This note is used to comply with regulatory documentation for home oxygen)  Patient Saturations on Room Air at Rest = 93%  Patient Saturations on Room Air while Ambulating = 95%   Please briefly explain why patient needs home oxygen: Patient does get out of breath with ambulation but did not desat during O2 walk.

## 2023-07-02 NOTE — Plan of Care (Signed)

## 2023-07-02 NOTE — Progress Notes (Signed)
   07/02/23 2312  BiPAP/CPAP/SIPAP  BiPAP/CPAP/SIPAP Pt Type Adult  Mask Type Full face mask  Dentures removed? Not applicable  Mask Size Medium  FiO2 (%) 21 %  Patient Home Machine No  Patient Home Mask No  Patient Home Tubing No  Auto Titrate Yes  Minimum cmH2O 5 cmH2O  Maximum cmH2O 20 cmH2O  Device Plugged into [employer] Power Outlet Yes

## 2023-07-02 NOTE — Plan of Care (Signed)
  Problem: Education: Goal: Knowledge of General Education information will improve Description: Including pain rating scale, medication(s)/side effects and non-pharmacologic comfort measures Outcome: Progressing   Problem: Clinical Measurements: Goal: Ability to maintain clinical measurements within normal limits will improve Outcome: Progressing Goal: Will remain free from infection Outcome: Progressing Goal: Diagnostic test results will improve Outcome: Progressing Goal: Respiratory complications will improve Outcome: Progressing   Problem: Nutrition: Goal: Adequate nutrition will be maintained Outcome: Progressing   Problem: Activity: Goal: Ability to tolerate increased activity will improve Outcome: Progressing   Problem: Respiratory: Goal: Ability to maintain a clear airway will improve Outcome: Progressing   Problem: Education: Goal: Ability to describe self-care measures that may prevent or decrease complications (Diabetes Survival Skills Education) will improve Outcome: Progressing

## 2023-07-03 ENCOUNTER — Inpatient Hospital Stay (HOSPITAL_COMMUNITY)

## 2023-07-03 DIAGNOSIS — J189 Pneumonia, unspecified organism: Secondary | ICD-10-CM | POA: Diagnosis not present

## 2023-07-03 LAB — CBC
HCT: 37.7 % (ref 36.0–46.0)
Hemoglobin: 12.5 g/dL (ref 12.0–15.0)
MCH: 30.1 pg (ref 26.0–34.0)
MCHC: 33.2 g/dL (ref 30.0–36.0)
MCV: 90.8 fL (ref 80.0–100.0)
Platelets: 335 10*3/uL (ref 150–400)
RBC: 4.15 MIL/uL (ref 3.87–5.11)
RDW: 13.7 % (ref 11.5–15.5)
WBC: 15.6 10*3/uL — ABNORMAL HIGH (ref 4.0–10.5)
nRBC: 0 % (ref 0.0–0.2)

## 2023-07-03 LAB — BASIC METABOLIC PANEL WITH GFR
Anion gap: 13 (ref 5–15)
BUN: 13 mg/dL (ref 8–23)
CO2: 23 mmol/L (ref 22–32)
Calcium: 8.8 mg/dL — ABNORMAL LOW (ref 8.9–10.3)
Chloride: 97 mmol/L — ABNORMAL LOW (ref 98–111)
Creatinine, Ser: 0.64 mg/dL (ref 0.44–1.00)
GFR, Estimated: >60 mL/min (ref >60)
Glucose, Bld: 186 mg/dL — ABNORMAL HIGH (ref 70–99)
Potassium: 3.4 mmol/L — ABNORMAL LOW (ref 3.5–5.1)
Sodium: 133 mmol/L — ABNORMAL LOW (ref 135–145)

## 2023-07-03 LAB — PROTEIN, PLEURAL OR PERITONEAL FLUID: Total protein, fluid: 4.5 g/dL

## 2023-07-03 LAB — GLUCOSE, CAPILLARY
Glucose-Capillary: 192 mg/dL — ABNORMAL HIGH (ref 70–99)
Glucose-Capillary: 234 mg/dL — ABNORMAL HIGH (ref 70–99)
Glucose-Capillary: 240 mg/dL — ABNORMAL HIGH (ref 70–99)
Glucose-Capillary: 273 mg/dL — ABNORMAL HIGH (ref 70–99)

## 2023-07-03 LAB — BODY FLUID CELL COUNT WITH DIFFERENTIAL
Eos, Fluid: 0 %
Lymphs, Fluid: 11 %
Monocyte-Macrophage-Serous Fluid: 3 % — ABNORMAL LOW (ref 50–90)
Neutrophil Count, Fluid: 86 % — ABNORMAL HIGH (ref 0–25)
Total Nucleated Cell Count, Fluid: 841 uL (ref 0–1000)

## 2023-07-03 LAB — AMYLASE, PLEURAL OR PERITONEAL FLUID: Amylase, Fluid: 16 U/L

## 2023-07-03 LAB — LACTATE DEHYDROGENASE, PLEURAL OR PERITONEAL FLUID: LD, Fluid: 633 U/L — ABNORMAL HIGH (ref 3–23)

## 2023-07-03 LAB — MAGNESIUM: Magnesium: 1.7 mg/dL (ref 1.7–2.4)

## 2023-07-03 LAB — ALBUMIN, PLEURAL OR PERITONEAL FLUID: Albumin, Fluid: 2.3 g/dL

## 2023-07-03 LAB — GLUCOSE, PLEURAL OR PERITONEAL FLUID: Glucose, Fluid: 185 mg/dL

## 2023-07-03 MED ORDER — LIDOCAINE HCL 1 % IJ SOLN
INTRAMUSCULAR | Status: AC
  Filled 2023-07-03: qty 20

## 2023-07-03 MED ORDER — POTASSIUM CHLORIDE CRYS ER 20 MEQ PO TBCR
40.0000 meq | EXTENDED_RELEASE_TABLET | Freq: Once | ORAL | Status: AC
  Administered 2023-07-03: 40 meq via ORAL
  Filled 2023-07-03: qty 2

## 2023-07-03 NOTE — Plan of Care (Signed)
  Problem: Education: Goal: Knowledge of General Education information will improve Description: Including pain rating scale, medication(s)/side effects and non-pharmacologic comfort measures Outcome: Progressing   Problem: Clinical Measurements: Goal: Will remain free from infection Outcome: Progressing Goal: Diagnostic test results will improve Outcome: Progressing Goal: Respiratory complications will improve Outcome: Progressing   Problem: Nutrition: Goal: Adequate nutrition will be maintained Outcome: Progressing   Problem: Pain Managment: Goal: General experience of comfort will improve and/or be controlled Outcome: Progressing   Problem: Respiratory: Goal: Ability to maintain a clear airway will improve Outcome: Progressing   Problem: Coping: Goal: Ability to adjust to condition or change in health will improve Outcome: Progressing   Problem: Metabolic: Goal: Ability to maintain appropriate glucose levels will improve Outcome: Progressing

## 2023-07-03 NOTE — Plan of Care (Signed)

## 2023-07-03 NOTE — Progress Notes (Signed)
 Physical Therapy Treatment Patient Details Name: Jade Perez MRN: [id number] DOB: 1955/07/14 Today's Date: 07/03/2023   History of Present Illness 68 yo female admitted with Pna. Hx of DM, OSA, depression, asthma, NSTEMI, RCR    PT Comments  Pt agreeable to session, states that she feels decreased endurance/activity tolerance today, has some inc pain in R posterolateral ribs. Pt able to amb using IV pole x80 feet today, met in hallway by procedure transport, pt able to return to room, sits in chair and prepares to head for thoracentesis.    If plan is discharge home, recommend the following: A little help with walking and/or transfers;A little help with bathing/dressing/bathroom;Assistance with cooking/housework;Assist for transportation;Help with stairs or ramp for entrance   Can travel by private vehicle        Equipment Recommendations  None recommended by PT    Recommendations for Other Services       Precautions / Restrictions Precautions Precautions: Fall Precaution/Restrictions Comments: monitor O2 Restrictions Weight Bearing Restrictions Per Provider Order: No     Mobility  Bed Mobility Overal bed mobility: Modified Independent Bed Mobility: Supine to Sit     Supine to sit: Supervision, HOB elevated     General bed mobility comments: pt sitting up in bed when PT arrives    Transfers Overall transfer level: Needs assistance Equipment used: None Transfers: Sit to/from Stand Sit to Stand: Supervision           General transfer comment: Increased time.    Ambulation/Gait Ambulation/Gait assistance: Contact guard assist Gait Distance (Feet): 80 Feet Assistive device: IV Pole Gait Pattern/deviations: Step-through pattern, Decreased stride length, Wide base of support Gait velocity: dec     General Gait Details: Pt uses slow cadence reciprocal pattern, IV pole for support, some inc SOB with activity   Stairs             Wheelchair  Mobility     Tilt Bed    Modified Rankin (Stroke Patients Only)       Balance Overall balance assessment: Needs assistance Sitting-balance support: No upper extremity supported, Feet supported       Standing balance support: During functional activity, No upper extremity supported Standing balance-Leahy Scale: Fair                              Hotel manager: No apparent difficulties  Cognition Arousal: Alert Behavior During Therapy: WFL for tasks assessed/performed   PT - Cognitive impairments: No apparent impairments                         Following commands: Intact      Cueing Cueing Techniques: Verbal cues  Exercises      General Comments General comments (skin integrity, edema, etc.): pt unable to complete session-heading to procedure      Pertinent Vitals/Pain Pain Assessment Pain Assessment: 0-10 Pain Score: 4  Pain Location: L side Pain Descriptors / Indicators: Sore Pain Intervention(s): Limited activity within patient's tolerance, Monitored during session    Home Living                          Prior Function            PT Goals (current goals can now be found in the care plan section) Acute Rehab PT Goals Patient Stated Goal: to get better and regain PLOF  PT Goal Formulation: With patient/family Time For Goal Achievement: 07/15/23 Potential to Achieve Goals: Good Progress towards PT goals: Progressing toward goals    Frequency    Min 1X/week      PT Plan      Co-evaluation              AM-PAC PT "6 Clicks" Mobility   Outcome Measure  Help needed turning from your back to your side while in a flat bed without using bedrails?: None Help needed moving from lying on your back to sitting on the side of a flat bed without using bedrails?: None Help needed moving to and from a bed to a chair (including a wheelchair)?: A Little Help needed standing up from a chair  using your arms (e.g., wheelchair or bedside chair)?: None Help needed to walk in hospital room?: A Little Help needed climbing 3-5 steps with a railing? : A Little 6 Click Score: 21    End of Session Equipment Utilized During Treatment: Gait belt Activity Tolerance: Patient limited by fatigue Patient left: in chair;Other (comment) (transport chair for procedure) Nurse Communication: Mobility status PT Visit Diagnosis: Difficulty in walking, not elsewhere classified (R26.2)     Time: 1610-9604 PT Time Calculation (min) (ACUTE ONLY): 12 min  Charges:    $Gait Training: 8-22 mins PT General Charges $$ ACUTE PT VISIT: 1 Visit                     Madaline Guthrie, PT Acute Rehabilitation Services Office: [phone] 07/03/2023    Evelena Peat 07/03/2023, 3:12 PM

## 2023-07-03 NOTE — Procedures (Signed)
 Interventional Radiology Procedure:   Indications: Pneumonia  Procedure: US guided thoracentesis  Findings: Removed 320 ml from left chest.  Complications: None     EBL: Less than 10 ml  Plan: Follow up CXR   Everleigh Colclasure R. Lowella Dandy, MD  Pager: [phone]

## 2023-07-03 NOTE — Progress Notes (Signed)
 PROGRESS NOTE    Jade Perez  XBM:[id number] DOB: [birth year] DOA: 06/30/2023 PCP: Abner Greenspan, MD    Brief Narrative:  68 year old with history of HTN, HLD, DM2, OSA, depression, asthma, NSTEMI with normal coronaries admitted for shortness of breath and cough.  Outpatient diagnosed of left lower lobe pneumonia on 3/22 but returns due to worsening of her pain and discomfort.  CTA chest negative for PE but does show left lower lobe consolidation started on Rocephin and azithromycin.  Unfortunately repeat chest x-ray showed worsening of left-sided effusion therefore thoracentesis ordered.  Assessment & Plan:  Principal Problem:   CAP (community acquired pneumonia) Active Problems:   Intrinsic asthma   Type 2 diabetes mellitus without complication, without long-term current use of insulin (HCC)   Acute respiratory failure with hypoxia (HCC)   Hypokalemia   Hypertension   Depression   OSA (obstructive sleep apnea)   Hyperlipidemia, mixed    Left lower lobe community-acquired pneumonia Left-sided pleural effusion, worsening Failed outpatient therapy.  Leukocytosis slightly up today, CTA chest negative for PE but showed left-sided lower lobe consolidation therefore on IV antibiotics.  This morning chest x-ray shows worsening of effusion therefore have ordered thoracentesis -Will need repeat chest x-ray or CT scan in about 8 weeks -Ambulatory pulse ox   Hypokalemia/hypophosphatemia Replete as needed   Essential hypertension Norvasc.  IV as needed   Hyperlipidemia Statin   Diabetes mellitus type 2 - A1c was 6.5% in September 2023  -Holding home Ozempic.  Continue Amaryl Sliding scale and Accu-Cheks   History of asthma and OSA Bronchodilators   Depression - Celexa    Smudge cells - Smudge cells noted on peripheral smear, percentage not specified  - Could be d/t the infection but she will need further workup with flow cytometry if leukocytosis persists   PT/OT-no  follow-up needed Ambulatory pulse ox  DVT prophylaxis: Lovenox    Code Status: Full Code Family Communication: Husband at bedside Status is: Inpatient Remains inpatient appropriate because: Continue hospital side for abnormal breath sounds    Subjective: Still having left-sided pleuritic chest pain Examination:  General exam: Appears calm and comfortable  Respiratory system: Left side diminished breath sounds Cardiovascular system: S1 & S2 heard, RRR. No JVD, murmurs, rubs, gallops or clicks. No pedal edema. Gastrointestinal system: Abdomen is nondistended, soft and nontender. No organomegaly or masses felt. Normal bowel sounds heard. Central nervous system: Alert and oriented. No focal neurological deficits. Extremities: Symmetric 5 x 5 power. Skin: No rashes, lesions or ulcers Psychiatry: Judgement and insight appear normal. Mood & affect appropriate.                Diet Orders (From admission, onward)     Start     Ordered   06/30/23 0511  Diet regular Room service appropriate? Yes; Fluid consistency: Thin  Diet effective now       Question Answer Comment  Room service appropriate? Yes   Fluid consistency: Thin      06/30/23 0512            Objective: Vitals:   07/02/23 2038 07/02/23 2054 07/03/23 0422 07/03/23 0820  BP: (!) 147/71  131/70   Pulse: 85  78   Resp: 18  18   Temp: 98.5 F (36.9 C)  99.3 F (37.4 C)   TempSrc: Oral  Oral   SpO2: 93% 94% 91% 90%  Weight:      Height:        Intake/Output Summary (Last 24  hours) at 07/03/2023 1158 Last data filed at 07/03/2023 0300 Gross per 24 hour  Intake 220 ml  Output --  Net 220 ml   Filed Weights   06/30/23 0046  Weight: 90.7 kg    Scheduled Meds:  amLODipine  5 mg Oral Daily   arformoterol  15 mcg Nebulization BID   atorvastatin  40 mg Oral QHS   azithromycin  500 mg Oral Daily   citalopram  20 mg Oral Daily   enoxaparin (LOVENOX) injection  40 mg Subcutaneous Q24H   fenofibrate   160 mg Oral Daily   ferrous sulfate  325 mg Oral Q breakfast   glimepiride  0.5 mg Oral BID   insulin aspart  0-5 Units Subcutaneous QHS   insulin aspart  0-6 Units Subcutaneous TID WC   montelukast  10 mg Oral QHS   pantoprazole  40 mg Oral Daily   revefenacin  175 mcg Nebulization Daily   sodium chloride flush  3 mL Intravenous Q12H   Continuous Infusions:  cefTRIAXone (ROCEPHIN)  IV 2 g (07/03/23 0146)    Nutritional status     Body mass index is 36.58 kg/m.  Data Reviewed:   CBC: Recent Labs  Lab 06/28/23 2308 06/30/23 0103 07/01/23 0550 07/02/23 0556 07/03/23 0540  WBC 19.6* 20.1* 16.4* 13.1* 15.6*  NEUTROABS 16.3* 16.7*  --   --   --   HGB 14.6 13.0 11.6* 11.8* 12.5  HCT 43.2 37.6 36.9 36.0 37.7  MCV 88.7 89.5 96.1 93.8 90.8  PLT 309 266 260 321 335   Basic Metabolic Panel: Recent Labs  Lab 06/30/23 0103 06/30/23 0804 07/01/23 0550 07/02/23 0556 07/03/23 0540  NA 133* 134* 131* 132* 133*  K 2.9* 3.3* 3.4* 3.6 3.4*  CL 99 101 97* 97* 97*  CO2 22 20* 22 20* 23  GLUCOSE 211* 159* 126* 155* 186*  BUN 19 17 18 16 13   CREATININE 1.02* 0.72 0.78 0.67 0.64  CALCIUM 9.3 9.2 8.7* 8.6* 8.8*  MG  --  1.6* 2.2 2.1 1.7  PHOS  --  1.9* 1.9*  --   --    GFR: Estimated Creatinine Clearance: 69.5 mL/min (by C-G formula based on SCr of 0.64 mg/dL). Liver Function Tests: Recent Labs  Lab 06/28/23 2308  AST 31  ALT 34  ALKPHOS 41  BILITOT 0.9  PROT 8.1  ALBUMIN 4.3   No results for input(s): "LIPASE", "AMYLASE" in the last 168 hours. No results for input(s): "AMMONIA" in the last 168 hours. Coagulation Profile: No results for input(s): "INR", "PROTIME" in the last 168 hours. Cardiac Enzymes: No results for input(s): "CKTOTAL", "CKMB", "CKMBINDEX", "TROPONINI" in the last 168 hours. BNP (last 3 results) No results for input(s): "PROBNP" in the last 8760 hours. HbA1C: Recent Labs    07/01/23 0550  HGBA1C 8.1*   CBG: Recent Labs  Lab 07/02/23 0803  07/02/23 1150 07/02/23 1640 07/02/23 2109 07/03/23 0723  GLUCAP 152* 231* 174* 200* 192*   Lipid Profile: No results for input(s): "CHOL", "HDL", "LDLCALC", "TRIG", "CHOLHDL", "LDLDIRECT" in the last 72 hours. Thyroid Function Tests: No results for input(s): "TSH", "T4TOTAL", "FREET4", "T3FREE", "THYROIDAB" in the last 72 hours. Anemia Panel: No results for input(s): "VITAMINB12", "FOLATE", "FERRITIN", "TIBC", "IRON", "RETICCTPCT" in the last 72 hours. Sepsis Labs: No results for input(s): "PROCALCITON", "LATICACIDVEN" in the last 168 hours.  Recent Results (from the past 240 hours)  Resp panel by RT-PCR (RSV, Flu A&B, Covid) Anterior Nasal Swab     Status:  None   Collection Time: 06/28/23  7:16 PM   Specimen: Anterior Nasal Swab  Result Value Ref Range Status   SARS Coronavirus 2 by RT PCR NEGATIVE NEGATIVE Final    Comment: (NOTE) SARS-CoV-2 target nucleic acids are NOT DETECTED.  The SARS-CoV-2 RNA is generally detectable in upper respiratory specimens during the acute phase of infection. The lowest concentration of SARS-CoV-2 viral copies this assay can detect is 138 copies/mL. A negative result does not preclude SARS-Cov-2 infection and should not be used as the sole basis for treatment or other patient management decisions. A negative result may occur with  improper specimen collection/handling, submission of specimen other than nasopharyngeal swab, presence of viral mutation(s) within the areas targeted by this assay, and inadequate number of viral copies(<138 copies/mL). A negative result must be combined with clinical observations, patient history, and epidemiological information. The expected result is Negative.  Fact Sheet for Patients:  BloggerCourse.com  Fact Sheet for Healthcare Providers:  SeriousBroker.it  This test is no t yet approved or cleared by the [place] FDA and  has been authorized for  detection and/or diagnosis of SARS-CoV-2 by FDA under an Emergency Use Authorization (EUA). This EUA will remain  in effect (meaning this test can be used) for the duration of the COVID-19 declaration under Section 564(b)(1) of the Act, 21 U.S.C.section 360bbb-3(b)(1), unless the authorization is terminated  or revoked sooner.       Influenza A by PCR NEGATIVE NEGATIVE Final   Influenza B by PCR NEGATIVE NEGATIVE Final    Comment: (NOTE) The Xpert Xpress SARS-CoV-2/FLU/RSV plus assay is intended as an aid in the diagnosis of influenza from Nasopharyngeal swab specimens and should not be used as a sole basis for treatment. Nasal washings and aspirates are unacceptable for Xpert Xpress SARS-CoV-2/FLU/RSV testing.  Fact Sheet for Patients: BloggerCourse.com  Fact Sheet for Healthcare Providers: SeriousBroker.it  This test is not yet approved or cleared by the [place] FDA and has been authorized for detection and/or diagnosis of SARS-CoV-2 by FDA under an Emergency Use Authorization (EUA). This EUA will remain in effect (meaning this test can be used) for the duration of the COVID-19 declaration under Section 564(b)(1) of the Act, 21 U.S.C. section 360bbb-3(b)(1), unless the authorization is terminated or revoked.     Resp Syncytial Virus by PCR NEGATIVE NEGATIVE Final    Comment: (NOTE) Fact Sheet for Patients: BloggerCourse.com  Fact Sheet for Healthcare Providers: SeriousBroker.it  This test is not yet approved or cleared by the [place] FDA and has been authorized for detection and/or diagnosis of SARS-CoV-2 by FDA under an Emergency Use Authorization (EUA). This EUA will remain in effect (meaning this test can be used) for the duration of the COVID-19 declaration under Section 564(b)(1) of the Act, 21 U.S.C. section 360bbb-3(b)(1), unless the authorization is  terminated or revoked.  Performed at [facility], [address] [place], [place] [address]   Blood culture (routine x 2)     Status: None (Preliminary result)   Collection Time: 06/30/23  1:03 AM   Specimen: BLOOD  Result Value Ref Range Status   Specimen Description   Final    BLOOD LEFT ANTECUBITAL Performed at [facility], [address] [place], [place] [address]    Special Requests   Final    BOTTLES DRAWN AEROBIC AND ANAEROBIC Blood Culture adequate volume Performed at [facility], [address], [place], [place] [address]  Culture   Final    NO GROWTH 2 DAYS Performed at [facility], [address], [place], [place] [address]    Report Status PENDING  Incomplete  Blood culture (routine x 2)     Status: None (Preliminary result)   Collection Time: 06/30/23  1:03 AM   Specimen: BLOOD RIGHT FOREARM  Result Value Ref Range Status   Specimen Description   Final    BLOOD RIGHT FOREARM Performed at [facility], [address] [place], [place] [address]    Special Requests   Final    BOTTLES DRAWN AEROBIC AND ANAEROBIC Blood Culture adequate volume Performed at [facility], [address] [place], [place] [address]    Culture   Final    NO GROWTH 2 DAYS Performed at [facility], [address], [place], [place] [address]    Report Status PENDING  Incomplete  Expectorated Sputum Assessment w Gram Stain, Rflx to Resp Cult     Status: None   Collection Time: 07/02/23  4:43 PM   Specimen: Expectorated Sputum  Result Value Ref Range Status   Specimen Description EXPECTORATED SPUTUM  Final   Special Requests NONE  Final   Sputum evaluation   Final    THIS SPECIMEN IS ACCEPTABLE FOR SPUTUM CULTURE Performed at [facility], [address] [place], [place] [address]    Report Status 07/02/2023 FINAL  Final  Culture, Respiratory w Gram Stain     Status: None (Preliminary  result)   Collection Time: 07/02/23  4:43 PM  Result Value Ref Range Status   Specimen Description   Final    EXPECTORATED SPUTUM Performed at [facility], [address] [place], [place] [address]    Special Requests   Final    NONE Reflexed from [id number] Performed at [facility], [address] [place], [place] [address]    Gram Stain   Final    FEW WBC PRESENT, PREDOMINANTLY PMN FEW GRAM POSITIVE COCCI RARE YEAST WITH PSEUDOHYPHAE RARE GRAM NEGATIVE RODS    Culture   Final    TOO YOUNG TO READ Performed at [facility], [address] [place], [place] [address]    Report Status PENDING  Incomplete         Radiology Studies: No results found.         LOS: 3 days   Time spent= 35 mins    Miguel Rota, MD [facility]  If 7PM-7AM, please contact night-coverage  07/03/2023, 11:58 AM

## 2023-07-03 NOTE — Progress Notes (Signed)
 OT Cancellation Note  Patient Details Name: Jade Perez MRN: [id number] DOB: [birth year]   Cancelled Treatment:    Reason Eval/Treat Not Completed: Patient at procedure or test/ unavailable  OT attempted visit this pm. Patient out of room at procedure. Will re-attempt visit as schedule allows.   Lilley Hubble OT/L Acute Rehabilitation Department  [phone]   07/03/2023, 2:20 PM

## 2023-07-03 NOTE — Progress Notes (Signed)
   07/03/23 2249  BiPAP/CPAP/SIPAP  BiPAP/CPAP/SIPAP Pt Type Adult  BiPAP/CPAP/SIPAP Resmed  Mask Type Full face mask  Dentures removed? Not applicable  Mask Size Medium  Respiratory Rate 17 breaths/min  Flow Rate 3 lpm (bled into circuit)  Patient Home Machine No  Patient Home Mask No  Patient Home Tubing No  Auto Titrate Yes  Minimum cmH2O 5 cmH2O  Maximum cmH2O 20 cmH2O  CPAP/SIPAP surface wiped down Yes  Device Plugged into [employer] Power Outlet Yes

## 2023-07-04 ENCOUNTER — Inpatient Hospital Stay (HOSPITAL_COMMUNITY)

## 2023-07-04 DIAGNOSIS — J9601 Acute respiratory failure with hypoxia: Secondary | ICD-10-CM | POA: Diagnosis not present

## 2023-07-04 DIAGNOSIS — J90 Pleural effusion, not elsewhere classified: Secondary | ICD-10-CM

## 2023-07-04 DIAGNOSIS — J189 Pneumonia, unspecified organism: Secondary | ICD-10-CM | POA: Diagnosis not present

## 2023-07-04 LAB — GLUCOSE, CAPILLARY
Glucose-Capillary: 193 mg/dL — ABNORMAL HIGH (ref 70–99)
Glucose-Capillary: 201 mg/dL — ABNORMAL HIGH (ref 70–99)
Glucose-Capillary: 141 mg/dL — ABNORMAL HIGH (ref 70–99)
Glucose-Capillary: 128 mg/dL — ABNORMAL HIGH (ref 70–99)

## 2023-07-04 LAB — CBC
HCT: 35.6 % — ABNORMAL LOW (ref 36.0–46.0)
Hemoglobin: 11.9 g/dL — ABNORMAL LOW (ref 12.0–15.0)
MCH: 30.4 pg (ref 26.0–34.0)
MCHC: 33.4 g/dL (ref 30.0–36.0)
MCV: 91 fL (ref 80.0–100.0)
Platelets: 337 10*3/uL (ref 150–400)
RBC: 3.91 MIL/uL (ref 3.87–5.11)
RDW: 14.2 % (ref 11.5–15.5)
WBC: 14.6 10*3/uL — ABNORMAL HIGH (ref 4.0–10.5)
nRBC: 0 % (ref 0.0–0.2)

## 2023-07-04 LAB — PROTIME-INR
INR: 1.9 — ABNORMAL HIGH (ref 0.8–1.2)
Prothrombin Time: 22 s — ABNORMAL HIGH (ref 11.4–15.2)

## 2023-07-04 LAB — RESPIRATORY PANEL BY PCR

## 2023-07-04 LAB — TRIGLYCERIDES, BODY FLUIDS: Triglycerides, Fluid: 87 mg/dL

## 2023-07-04 LAB — BASIC METABOLIC PANEL WITH GFR
Anion gap: 12 (ref 5–15)
BUN: 13 mg/dL (ref 8–23)
CO2: 22 mmol/L (ref 22–32)
Calcium: 8.7 mg/dL — ABNORMAL LOW (ref 8.9–10.3)
Chloride: 99 mmol/L (ref 98–111)
Creatinine, Ser: 0.59 mg/dL (ref 0.44–1.00)
GFR, Estimated: >60 mL/min (ref >60)
Glucose, Bld: 216 mg/dL — ABNORMAL HIGH (ref 70–99)
Potassium: 3.7 mmol/L (ref 3.5–5.1)
Sodium: 133 mmol/L — ABNORMAL LOW (ref 135–145)

## 2023-07-04 LAB — MAGNESIUM: Magnesium: 1.7 mg/dL (ref 1.7–2.4)

## 2023-07-04 MED ORDER — INSULIN GLARGINE-YFGN 100 UNIT/ML ~~LOC~~ SOLN
10.0000 [IU] | Freq: Every day | SUBCUTANEOUS | Status: DC
  Administered 2023-07-04 – 2023-07-06 (×3): 10 [IU] via SUBCUTANEOUS
  Filled 2023-07-04 (×4): qty 0.1

## 2023-07-04 MED ORDER — LIDOCAINE 5 % EX PTCH
2.0000 | MEDICATED_PATCH | CUTANEOUS | Status: DC
  Administered 2023-07-04: 1 via TRANSDERMAL
  Administered 2023-07-05 – 2023-07-12 (×8): 2 via TRANSDERMAL
  Filled 2023-07-04 (×9): qty 2

## 2023-07-04 MED ORDER — FENTANYL CITRATE (PF) 100 MCG/2ML IJ SOLN
INTRAMUSCULAR | Status: AC
  Filled 2023-07-04: qty 4

## 2023-07-04 MED ORDER — SODIUM CHLORIDE 0.9 % IV SOLN
1.0000 g | INTRAVENOUS | Status: DC
  Administered 2023-07-05 – 2023-07-06 (×2): 1 g via INTRAVENOUS
  Filled 2023-07-04 (×2): qty 10

## 2023-07-04 MED ORDER — FENTANYL CITRATE PF 50 MCG/ML IJ SOSY
25.0000 ug | PREFILLED_SYRINGE | INTRAMUSCULAR | Status: DC | PRN
  Administered 2023-07-05 – 2023-07-10 (×3): 25 ug via INTRAVENOUS
  Filled 2023-07-04 (×3): qty 1

## 2023-07-04 MED ORDER — MIDAZOLAM HCL 2 MG/2ML IJ SOLN
INTRAMUSCULAR | Status: AC
  Filled 2023-07-04: qty 4

## 2023-07-04 MED ORDER — MIDAZOLAM HCL 2 MG/2ML IJ SOLN
INTRAMUSCULAR | Status: AC | PRN
  Administered 2023-07-04 (×2): 2 mg via INTRAVENOUS

## 2023-07-04 NOTE — Consult Note (Signed)
 NAME:  Jade Perez, MRN:  [id number], DOB:  [birth year], LOS: 4 ADMISSION DATE:  06/30/2023, CONSULTATION DATE:  07/04/2023 REFERRING MD:  DR Nelson Chimes, CHIEF COMPLAINT:  LLL white out    History of Present Illness:   68 year old female with past medical history of non-smoking, hypertension, hyperlipidemia, type 2 diabetes, OSA, depression, asthma not otherwise specified presented on 06/30/2023 with cough worsening dyspnea and pleuritic chest pain on the left lower lobe. PRior to that had sick resp contact with grandchild who live with her and t hen she got sick  CT chest showed left lower lobe consolidation with a small pleural effusion [personal visualization of the CCM MD on this consult] she was hypoxemic requiring oxygen.  She had leukocytosis with white count 20,100.  Pulmonary embolism was ruled out started on Treatment.  Subsequently urine strep and Legionella negative.  Respiratory virus panel for COVID, flu and RSV negative [20 panel not done].  Since admission has been afebrile.  Has improving leukocytosis from 20,002 currently 14,600.  Cultures remain negative.  On 07/03/2023 underwent left-sided thoracentesis removing 350 cc by interventional radiology.  This shows a simple exudate with LDH 633 and acute neutrophilic leukocytosis with polymorphs of 86%.  No eosinophils.  Despite all these interventions has remained on 4 L nasal cannula.  She has been covered by IV antibiotics with ceftriaxone and azithromycin since admission and due to and on 07/04/2023 [facility] have PCCM consult].  Of note prior to admission she was treated as outpatient with antibiotics and then failed that.   Pulmonary is now been consulted with a question of residual loculated effusion  Past Medical History:    has a past medical history of Allergic rhinitis, Asthma, mild intermittent, Complication of anesthesia, Depression, GAD (generalized anxiety disorder), GERD (gastroesophageal reflux disease), Hiatal hernia,  History of cardiomyopathy in adulthood (12/06/2021), History of heart murmur in childhood, History of kidney stones, History of non-ST elevation myocardial infarction (NSTEMI) (12/06/2021), Hyperlipidemia, mixed, Hypertension, IDA (iron deficiency anemia), OA (osteoarthritis), OSA (obstructive sleep apnea), Renal disorder, Type 2 diabetes mellitus (HCC) (2008), and Wears glasses.   reports that she has never smoked. She has never used smokeless tobacco.  Past Surgical History:  Procedure Laterality Date   BREAST EXCISIONAL BIOPSY Right    x2  last one 1980s , benign   CARPAL TUNNEL RELEASE Right 05/22/2017   Procedure: RIGHT CARPAL TUNNEL RELEASE;  Surgeon: Cindee Salt, MD;  Location: [facility];  Service: Orthopedics;  Laterality: Right;   CARPAL TUNNEL RELEASE Left 03/12/2018   Procedure: LEFT CARPAL TUNNEL RELEASE;  Surgeon: Cindee Salt, MD;  Location: [facility];  Service: Orthopedics;  Laterality: Left;   CYSTOSCOPY W/ URETERAL STENT PLACEMENT Bilateral 12/11/2021   Procedure: CYSTOSCOPY , BILATERAL RETROGRADE , BILATERAL STENT PLACEMENT;  Surgeon: Sebastian Ache, MD;  Location: [facility] OR;  Service: Urology;  Laterality: Bilateral;   CYSTOSCOPY WITH RETROGRADE PYELOGRAM, URETEROSCOPY AND STENT PLACEMENT Bilateral 01/02/2022   Procedure: CYSTOSCOPY WITH RETROGRADE PYELOGRAM, URETEROSCOPY AND STENT REPLACEMENT;  Surgeon: Sebastian Ache, MD;  Location: [facility];  Service: Urology;  Laterality: Bilateral;   DIAGNOSTIC LAPAROSCOPY     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   DILATION AND CURETTAGE OF UTERUS     yrs ago for missed ab   EXTRACORPOREAL SHOCK WAVE LITHOTRIPSY  03/2017   HOLMIUM LASER APPLICATION Bilateral 01/02/2022   Procedure: HOLMIUM LASER APPLICATION;  Surgeon: Sebastian Ache, MD;  [facility];  Service: Urology;  Laterality: Bilateral;   LAPAROSCOPIC APPENDECTOMY  2008   LEFT HEART CATH AND CORONARY ANGIOGRAPHY N/A  12/07/2021   Procedure: LEFT HEART CATH AND CORONARY ANGIOGRAPHY;  Surgeon: Lyn Records, MD;  Location: [facility];  Service: Cardiovascular;  Laterality: N/A;   LIPOMA EXCISION  2003   right shoulder   SCLERAL BUCKLE  10/15/2011   Procedure: SCLERAL BUCKLE;  Surgeon: Sherrie George, MD;  Location: [facility] OR;  Service: Ophthalmology;  Laterality: Right;   SHOULDER ARTHROSCOPY WITH ROTATOR CUFF REPAIR Right 03/25/2019   Procedure: Right shoulder arthroscopy, subacromial decompression, distal clavicle resection, rotator cuff repair;  Surgeon: Francena Hanly, MD;  Location: [facility];  Service: Orthopedics;  Laterality: Right;    WISDOM TOOTH EXTRACTION      Allergies  Allergen Reactions   Amoxicillin Hives    Did it involve swelling of the face/tongue/throat, SOB, or low BP? No Did it involve sudden or severe rash/hives, skin peeling, or any reaction on the inside of your mouth or nose? No Did you need to seek medical attention at a hospital or doctor's office? No When did it last happen?      15-20 years ago If all above answers are "NO", may proceed with cephalosporin use.    Aspirin Swelling   Bee Venom Hives and Other (See Comments)    cellulitis   Clarithromycin Nausea And Vomiting   Erythromycin Nausea Only   Ibuprofen Swelling   Oxycodone Itching   Sulfa Antibiotics Hives   Latex Rash   Tape Rash and Other (See Comments)    Band-Aids, also = blisters     There is no immunization history on file for this patient.  Family History  Problem Relation Age of Onset   Unexplained death Sister 49       died in her sleep   Colon cancer Paternal Grandmother    Colon cancer Paternal Uncle    Allergic rhinitis Neg Hx    Angioedema Neg Hx    Asthma Neg Hx    Atopy Neg Hx    Eczema Neg Hx    Immunodeficiency Neg Hx    Urticaria Neg Hx    Colon polyps Neg Hx    Esophageal cancer Neg Hx    Stomach cancer Neg Hx    Rectal cancer Neg Hx    Breast cancer Neg Hx       Current Facility-Administered Medications:    acetaminophen (TYLENOL) tablet 650 mg, 650 mg, Oral, Q6H PRN, 650 mg at 07/04/23 0202 **OR** acetaminophen (TYLENOL) suppository 650 mg, 650 mg, Rectal, Q6H PRN, Opyd, Timothy S, MD   amLODipine (NORVASC) tablet 5 mg, 5 mg, Oral, Daily, Opyd, Timothy S, MD, 5 mg at 07/04/23 0927   arformoterol (BROVANA) nebulizer solution 15 mcg, 15 mcg, Nebulization, BID, Amin, Ankit C, MD, 15 mcg at 07/04/23 0819   atorvastatin (LIPITOR) tablet 40 mg, 40 mg, Oral, QHS, Opyd, Timothy S, MD, 40 mg at 07/03/23 2104   bisacodyl (DULCOLAX) EC tablet 5 mg, 5 mg, Oral, Daily PRN, Opyd, Lavone Neri, MD   citalopram (CELEXA) tablet 20 mg, 20 mg, Oral, Daily, Opyd, Timothy S, MD, 20 mg at 07/04/23 1610   cyclobenzaprine (FLEXERIL) tablet 5 mg, 5 mg, Oral, TID PRN, Amin, Ankit C, MD, 5 mg at 07/03/23 2103   enoxaparin (LOVENOX) injection 40 mg, 40 mg, Subcutaneous, Q24H, Opyd, Lavone Neri, MD, 40 mg at 07/04/23 9604   fenofibrate tablet 160 mg, 160 mg, Oral, Daily, Amin, Ankit  C, MD, 160 mg at 07/04/23 0928   fentaNYL (SUBLIMAZE) injection 25 mcg, 25 mcg, Intravenous, Q2H PRN, Amin, Ankit C, MD   ferrous sulfate tablet 325 mg, 325 mg, Oral, Q breakfast, Amin, Ankit C, MD, 325 mg at 07/04/23 0814   glimepiride (AMARYL) tablet 0.5 mg, 0.5 mg, Oral, BID, Amin, Ankit C, MD, 0.5 mg at 07/04/23 0927   guaiFENesin (ROBITUSSIN) 100 MG/5ML liquid 5 mL, 5 mL, Oral, Q4H PRN, Amin, Ankit C, MD   hydrALAZINE (APRESOLINE) injection 10 mg, 10 mg, Intravenous, Q4H PRN, Amin, Ankit C, MD   HYDROcodone-acetaminophen (NORCO/VICODIN) 5-325 MG per tablet 1 tablet, 1 tablet, Oral, Q4H PRN, Opyd, Lavone Neri, MD, 1 tablet at 07/01/23 0452   insulin aspart (novoLOG) injection 0-5 Units, 0-5 Units, Subcutaneous, QHS, Opyd, Lavone Neri, MD, 3 Units at 07/03/23 2103   insulin aspart (novoLOG) injection 0-6 Units, 0-6 Units, Subcutaneous, TID WC, Opyd, Lavone Neri, MD, 1 Units at 07/04/23 0814    ipratropium-albuterol (DUONEB) 0.5-2.5 (3) MG/3ML nebulizer solution 3 mL, 3 mL, Nebulization, Q4H PRN, Amin, Ankit C, MD   lidocaine (LIDODERM) 5 % 2 patch, 2 patch, Transdermal, Q24H, Amin, Ankit C, MD   metoprolol tartrate (LOPRESSOR) injection 5 mg, 5 mg, Intravenous, Q4H PRN, Amin, Ankit C, MD   montelukast (SINGULAIR) tablet 10 mg, 10 mg, Oral, QHS, Opyd, Lavone Neri, MD, 10 mg at 07/03/23 2104   ondansetron (ZOFRAN) tablet 4 mg, 4 mg, Oral, Q6H PRN **OR** ondansetron (ZOFRAN) injection 4 mg, 4 mg, Intravenous, Q6H PRN, Opyd, Timothy S, MD, 4 mg at 07/03/23 1737   pantoprazole (PROTONIX) EC tablet 40 mg, 40 mg, Oral, Daily, Opyd, Lavone Neri, MD, 40 mg at 07/04/23 3875   polyethylene glycol (MIRALAX / GLYCOLAX) packet 17 g, 17 g, Oral, Daily PRN, Opyd, Lavone Neri, MD   revefenacin (YUPELRI) nebulizer solution 175 mcg, 175 mcg, Nebulization, Daily, Amin, Ankit C, MD, 175 mcg at 07/04/23 0819   sodium chloride flush (NS) 0.9 % injection 3 mL, 3 mL, Intravenous, Q12H, Opyd, Lavone Neri, MD, 3 mL at 07/04/23 6433   traZODone (DESYREL) tablet 50 mg, 50 mg, Oral, QHS PRN, Miguel Rota, MD     Significant Hospital Events:  06/30/2023 - admit -> CT angiogram rule out pulmonary embolism.  Small left-sided pleural effusion.  Significant left lower lobe consolidation 07/03/2023: Worsening chest x-ray with significant whiteout on the left side.  S/p thoracentesis by IR draining only 320 cc.  Simple exudate -> post Thora chest x-ray without change    Interim History / Subjective:   07/04/2023 - seen in bed [facility]. [place] at side. On 2L [place]  Objective   Blood pressure (!) 157/89, pulse 84, temperature 98.2 F (36.8 C), temperature source Oral, resp. rate 20, height 5\' 2"  (1.575 m), weight 90.7 kg, SpO2 92%.        Intake/Output Summary (Last 24 hours) at 07/04/2023 0948 Last data filed at 07/04/2023 0300 Gross per 24 hour  Intake 99.07 ml  Output --  Net 99.07 ml   Filed Weights    06/30/23 0046  Weight: 90.7 kg    Examination: General: OBes. Sitting in chair. COUGH  HENT: o2 + on 2L [place] Lungs: CLASSIC BRONCHIAL BREATH SOUNDS LLL and the extreme LLL dimihsed breathsounds. AEGOPHONY + Cardiovascular: Normal hear sounds Abdomen: soft. Non tender Extremities: intact Neuro: AxO3. Speech normal  GU: Not examined  Resolved Hospital Problem list   x  Assessment & Plan:   LLL Consoliation  from CAP without identification  of organism - small pleural effusion at admit 06/30/2023 Worsened despite CAP Rx - Worsening CXR . Small yield in thora only Exam 3/28 - with bronchial breath sounds - WORSENING CONSOLIDATIN v associated loculated effusion   P:   GEt CT chest GEt 20 panel RVP Cotninue CAP Rx fornow    Acute REsp failure due to aboe  - 07/04/2023 2L [place]  P:   O2 for pusle ox > 88% INcentive sprimetry  Coiugh -s evere    P:  Await CT results to decide next steps   Best practice (daily eval):  Per TRiad  Family Updates: patient and hjusband updated at bedside       SIGNATURE    Dr. Kalman Shan, M.D., F.C.C.P,  Pulmonary and Critical Care Medicine Staff Physician, [facility] Director - Interstitial Lung Disease  Program  Pulmonary Fibrosis [facility] Network at [facility] [place], [place], [address]  NPI Number:  NPI #[id number]  Pager: [phone], If no answer  -> Check AMION or Try [phone] Telephone (clinical office): [phone] Telephone (research): [phone]  9:48 AM 07/04/2023   07/04/2023 9:48 AM    LABS    PULMONARY No results for input(s): "PHART", "PCO2ART", "PO2ART", "HCO3", "TCO2", "O2SAT" in the last 168 hours.  Invalid input(s): "PCO2", "PO2"  CBC Recent Labs  Lab 07/02/23 0556 07/03/23 0540 07/04/23 0510  HGB 11.8* 12.5 11.9*  HCT 36.0 37.7 35.6*  WBC 13.1* 15.6* 14.6*  PLT 321 335 337    COAGULATION No results for input(s): "INR" in the last 168  hours.  CARDIAC  No results for input(s): "TROPONINI" in the last 168 hours. No results for input(s): "PROBNP" in the last 168 hours.  CHEMISTRY Recent Labs  Lab 06/30/23 0804 07/01/23 0550 07/02/23 0556 07/03/23 0540 07/04/23 0510  NA 134* 131* 132* 133* 133*  K 3.3* 3.4* 3.6 3.4* 3.7  CL 101 97* 97* 97* 99  CO2 20* 22 20* 23 22  GLUCOSE 159* 126* 155* 186* 216*  BUN 17 18 16 13 13   CREATININE 0.72 0.78 0.67 0.64 0.59  CALCIUM 9.2 8.7* 8.6* 8.8* 8.7*  MG 1.6* 2.2 2.1 1.7 1.7  PHOS 1.9* 1.9*  --   --   --    Estimated Creatinine Clearance: 69.5 mL/min (by C-G formula based on SCr of 0.59 mg/dL).   LIVER Recent Labs  Lab 06/28/23 2308  AST 31  ALT 34  ALKPHOS 41  BILITOT 0.9  PROT 8.1  ALBUMIN 4.3     INFECTIOUS No results for input(s): "LATICACIDVEN", "PROCALCITON" in the last 168 hours.   ENDOCRINE CBG (last 3)  Recent Labs    07/03/23 1707 07/03/23 2053 07/04/23 0731  GLUCAP 240* 273* 193*         IMAGING x48h  - image(s) personally visualized  -   highlighted in bold DG Chest 1 View Result Date: 07/03/2023 CLINICAL DATA:  Status post left thoracentesis. EXAM: CHEST  1 VIEW COMPARISON:  07/03/2023 at 0903 hours FINDINGS: Aeration in the left lung has mildly improved. There continues to be a large amount of consolidation or pleural fluid in the mid and lower left hemithorax. Negative for a pneumothorax. Right lung is clear. Cardiac silhouette is obscured by the densities in left lower chest. IMPRESSION: 1. Mild improvement in aeration in the left lung. Negative for a pneumothorax. 2. Large amount of consolidation or loculated pleural fluid in the left  chest. Electronically Signed   By: Richarda Overlie M.D.   On: 07/03/2023 15:11   US THORACENTESIS ASP PLEURAL SPACE W/IMG GUIDE Result Date: 07/03/2023 INDICATION: Dyspnea.  Pneumonia. EXAM: ULTRASOUND GUIDED LEFT THORACENTESIS MEDICATIONS: None. COMPLICATIONS: None immediate. PROCEDURE: An ultrasound guided  thoracentesis was thoroughly discussed with the patient and questions answered. The benefits, risks, alternatives and complications were also discussed. The patient understands and wishes to proceed with the procedure. Written consent was obtained. Ultrasound was performed to localize and mark an adequate pocket of fluid in the left posterior chest. The area was then prepped and draped in the normal sterile fashion. 1% Lidocaine was used for local anesthesia. Under ultrasound guidance a 6 Fr Safe-T-Centesis catheter was introduced. Thoracentesis was performed. The catheter was removed and a dressing applied. FINDINGS: A total of approximately 320 mL of clear yellow fluid was removed. Samples were sent to the laboratory as requested by the clinical team. Pleural catheter stopped draining despite ultrasound showing residual pleural fluid. Findings compatible with loculated pleural fluid. IMPRESSION: Successful ultrasound guided left thoracentesis yielding 320 mL of pleural fluid. Electronically Signed   By: Richarda Overlie M.D.   On: 07/03/2023 15:08   DG Chest Port 1 View Result Date: 07/03/2023 CLINICAL DATA:  Dyspnea. EXAM: PORTABLE CHEST 1 VIEW COMPARISON:  Chest two views 06/30/2023 and 06/28/2023; CT abdomen and pelvis 06/30/2023 FINDINGS: Interval worsening of homogeneous opacification of the inferior 80% of the left hemithorax, likely a combination of pleural fluid and airspace opacification/consolidation. The right lung remains clear. The visualized cardiac silhouette and mediastinal contours are within limits. Mild-to-moderate atherosclerotic calcifications within the aortic arch. No pleural effusion. No acute skeletal abnormality. IMPRESSION: Interval worsening of homogeneous opacification of the inferior 80% of the left hemithorax, likely a combination of pleural fluid and airspace opacification/consolidation. Electronically Signed   By: Neita Garnet M.D.   On: 07/03/2023 12:57

## 2023-07-04 NOTE — Progress Notes (Addendum)
 Physical Therapy Treatment Patient Details Name: Jade Perez MRN: [id number] DOB: [birth year] Today's Date: 07/04/2023   History of Present Illness 68 yo female admitted with Pna. Hx of DM, OSA, depression, asthma, NSTEMI, RCR    PT Comments  Pt seen for PT tx with pt agreeable, spouse present in room. Pt pleasant & motivated to participate. Pt is able to complete STS without assistance, ambulate in hallway without AD & CGA<>supervision. Pt presents with significantly decreased gait speed, c/o feeling "wobbly" at times but no significant LOB appreciated. Pt would benefit from ongoing PT treatment to address endurance, balance, & gait with LRAD.   If plan is discharge home, recommend the following: A little help with walking and/or transfers;A little help with bathing/dressing/bathroom;Assistance with cooking/housework;Assist for transportation;Help with stairs or ramp for entrance   Can travel by private vehicle        Equipment Recommendations  None recommended by PT    Recommendations for Other Services       Precautions / Restrictions Precautions Precautions: Fall Precaution/Restrictions Comments: monitor O2 Restrictions Weight Bearing Restrictions Per Provider Order: No     Mobility  Bed Mobility               General bed mobility comments: not tested, pt received & left sitting in recliner    Transfers Overall transfer level: Needs assistance Equipment used: None Transfers: Sit to/from Stand Sit to Stand: Supervision           General transfer comment: STS from recliner without assistance    Ambulation/Gait Ambulation/Gait assistance: Supervision, Contact guard assist Gait Distance (Feet): 100 Feet (+ 100 ft) Assistive device: None Gait Pattern/deviations: Step-through pattern, Decreased stride length, Wide base of support Gait velocity: significantly decreased     General Gait Details: pt with c/o feeling "wobbly", occasionally reaching for  rail but no overt/significant LOB   Stairs             Wheelchair Mobility     Tilt Bed    Modified Rankin (Stroke Patients Only)       Balance Overall balance assessment: Needs assistance Sitting-balance support: No upper extremity supported, Feet supported Sitting balance-Leahy Scale: Good     Standing balance support: No upper extremity supported, During functional activity Standing balance-Leahy Scale: Fair                              Hotel manager: No apparent difficulties  Cognition Arousal: Alert Behavior During Therapy: WFL for tasks assessed/performed   PT - Cognitive impairments: No apparent impairments                         Following commands: Intact      Cueing Cueing Techniques: Verbal cues  Exercises      General Comments General comments (skin integrity, edema, etc.): Pt on 3L/min via nasal cannula, lowest SpO2 of 89% after gait, increases with recovery. PT educated & encouraged pt to perform pursed lip breathing.      Pertinent Vitals/Pain Pain Assessment Pain Assessment: Faces Faces Pain Scale: Hurts little more Pain Location: aching across chest Pain Descriptors / Indicators: Aching, Sore Pain Intervention(s): Monitored during session (notified MD & nurse)    Home Living                          Prior Function  PT Goals (current goals can now be found in the care plan section) Acute Rehab PT Goals Patient Stated Goal: to get better and regain PLOF Time For Goal Achievement: 07/15/23 Potential to Achieve Goals: Good Progress towards PT goals: Progressing toward goals    Frequency    Min 1X/week      PT Plan      Co-evaluation              AM-PAC PT "6 Clicks" Mobility   Outcome Measure  Help needed turning from your back to your side while in a flat bed without using bedrails?: None Help needed moving from lying on your back to  sitting on the side of a flat bed without using bedrails?: None Help needed moving to and from a bed to a chair (including a wheelchair)?: A Little Help needed standing up from a chair using your arms (e.g., wheelchair or bedside chair)?: A Little Help needed to walk in hospital room?: A Little Help needed climbing 3-5 steps with a railing? : A Little 6 Click Score: 20    End of Session Equipment Utilized During Treatment: Gait belt;Oxygen Activity Tolerance: Patient limited by fatigue Patient left: in chair;with call bell/phone within reach;with family/visitor present Nurse Communication: Mobility status PT Visit Diagnosis: Unsteadiness on feet (R26.81);Muscle weakness (generalized) (M62.81)     Time: 5643-3295 PT Time Calculation (min) (ACUTE ONLY): 24 min  Charges:    $Therapeutic Activity: 23-37 mins PT General Charges $$ ACUTE PT VISIT: 1 Visit                     Aleda Grana, PT, DPT 07/04/23, 1:05 PM   Sandi Mariscal 07/04/2023, 1:04 PM

## 2023-07-04 NOTE — Plan of Care (Signed)
  Problem: Education: Goal: Knowledge of General Education information will improve Description: Including pain rating scale, medication(s)/side effects and non-pharmacologic comfort measures Outcome: Progressing   Problem: Clinical Measurements: Goal: Ability to maintain clinical measurements within normal limits will improve Outcome: Progressing Goal: Will remain free from infection Outcome: Progressing Goal: Diagnostic test results will improve Outcome: Progressing Goal: Respiratory complications will improve Outcome: Progressing   Problem: Pain Managment: Goal: General experience of comfort will improve and/or be controlled Outcome: Progressing   Problem: Education: Goal: Ability to describe self-care measures that may prevent or decrease complications (Diabetes Survival Skills Education) will improve Outcome: Progressing   Problem: Fluid Volume: Goal: Ability to maintain a balanced intake and output will improve Outcome: Progressing   Problem: Metabolic: Goal: Ability to maintain appropriate glucose levels will improve Outcome: Progressing

## 2023-07-04 NOTE — Progress Notes (Signed)
 CT shiws larger and loculated empyema  Plan dw/ [facility] earlier  - get IR guided chet tube pigtail -> based on resposne -> PCCM to cinsider intrapleural fibrinolytics     SIGNATURE    Dr. Kalman Shan, M.D., F.C.C.P,  Pulmonary and Critical Care Medicine Staff Physician, [facility] Center Director - Interstitial Lung Disease  Program  Pulmonary Fibrosis [facility] at [facility] [place], [place], [address]   Pager: [phone], If no answer  -> Check AMION or Try [phone] Telephone (clinical office): [phone] Telephone (research): [phone]  4:27 PM 07/04/2023      CT CHEST WO CONTRAST Result Date: 07/04/2023 CLINICAL DATA:  Empyema. Recently diagnosed with left lower lobe pneumonia with worsening chest pain and enlarging pleural effusion. Thoracentesis performed yesterday. History of diabetes and hypertension. EXAM: CT CHEST WITHOUT CONTRAST TECHNIQUE: Multidetector CT imaging of the chest was performed following the standard protocol without IV contrast. RADIATION DOSE REDUCTION: This exam was performed according to the departmental dose-optimization program which includes automated exposure control, adjustment of the mA and/or kV according to patient size and/or use of iterative reconstruction technique. COMPARISON:  Radiographs 07/03/2023 and 06/30/2023. Chest CTA 06/30/2023. FINDINGS: Cardiovascular: No significant vascular findings on noncontrast imaging. Mild aortic and coronary artery atherosclerosis noted. The heart size is normal. There is no pericardial effusion. Mediastinum/Nodes: Stable mildly prominent subcarinal and hilar lymph nodes bilaterally, likely reactive. Small hiatal hernia.The thyroid gland and trachea appear unremarkable. Lungs/Pleura: Moderate to large left pleural effusion has significantly enlarged from the previous CT of 4 days ago and has loculated components laterally measuring approximately 13.8 x 5.5 x  11.5 cm, consistent with reported empyema. There is resulting increased compressive atelectasis of the left lower lobe and lingula. Underlying left lower lobe consolidation. No evidence of pneumothorax. Trace dependent right pleural effusion. The right lung is clear. Upper abdomen: No acute findings are seen in the visualized upper abdomen. Hepatic steatosis noted. Possible punctate nonobstructing bilateral renal calculi. Musculoskeletal/Chest wall: There is no chest wall mass or suspicious osseous finding. IMPRESSION: 1. Significant enlargement of left pleural effusion with loculated components laterally, consistent with reported empyema. This effusion is suboptimally characterized by this noncontrast study. Consider chest tube placement. 2. Resulting increased compressive atelectasis of the left lower lobe and lingula. Underlying left lower lobe consolidation consistent with pneumonia. 3. Trace dependent right pleural effusion.  Clear right lung. 4. Stable mildly prominent mediastinal and hilar lymph nodes, likely reactive. 5. Hepatic steatosis. Possible punctate nonobstructing bilateral renal calculi. 6.  Aortic Atherosclerosis (ICD10-I70.0). Electronically Signed   By: Carey Bullocks M.D.   On: 07/04/2023 14:58   DG Chest 1 View Result Date: 07/03/2023 CLINICAL DATA:  Status post left thoracentesis. EXAM: CHEST  1 VIEW COMPARISON:  07/03/2023 at 0903 hours FINDINGS: Aeration in the left lung has mildly improved. There continues to be a large amount of consolidation or pleural fluid in the mid and lower left hemithorax. Negative for a pneumothorax. Right lung is clear. Cardiac silhouette is obscured by the densities in left lower chest. IMPRESSION: 1. Mild improvement in aeration in the left lung. Negative for a pneumothorax. 2. Large amount of consolidation or loculated pleural fluid in the left chest. Electronically Signed   By: Richarda Overlie M.D.   On: 07/03/2023 15:11   US THORACENTESIS ASP PLEURAL SPACE  W/IMG GUIDE Result Date: 07/03/2023 INDICATION: Dyspnea.  Pneumonia. EXAM: ULTRASOUND GUIDED LEFT THORACENTESIS MEDICATIONS: None. COMPLICATIONS: None immediate.  PROCEDURE: An ultrasound guided thoracentesis was thoroughly discussed with the patient and questions answered. The benefits, risks, alternatives and complications were also discussed. The patient understands and wishes to proceed with the procedure. Written consent was obtained. Ultrasound was performed to localize and mark an adequate pocket of fluid in the left posterior chest. The area was then prepped and draped in the normal sterile fashion. 1% Lidocaine was used for local anesthesia. Under ultrasound guidance a 6 Fr Safe-T-Centesis catheter was introduced. Thoracentesis was performed. The catheter was removed and a dressing applied. FINDINGS: A total of approximately 320 mL of clear yellow fluid was removed. Samples were sent to the laboratory as requested by the clinical team. Pleural catheter stopped draining despite ultrasound showing residual pleural fluid. Findings compatible with loculated pleural fluid. IMPRESSION: Successful ultrasound guided left thoracentesis yielding 320 mL of pleural fluid. Electronically Signed   By: Richarda Overlie M.D.   On: 07/03/2023 15:08   DG Chest Port 1 View Result Date: 07/03/2023 CLINICAL DATA:  Dyspnea. EXAM: PORTABLE CHEST 1 VIEW COMPARISON:  Chest two views 06/30/2023 and 06/28/2023; CT abdomen and pelvis 06/30/2023 FINDINGS: Interval worsening of homogeneous opacification of the inferior 80% of the left hemithorax, likely a combination of pleural fluid and airspace opacification/consolidation. The right lung remains clear. The visualized cardiac silhouette and mediastinal contours are within limits. Mild-to-moderate atherosclerotic calcifications within the aortic arch. No pleural effusion. No acute skeletal abnormality. IMPRESSION: Interval worsening of homogeneous opacification of the inferior 80% of the  left hemithorax, likely a combination of pleural fluid and airspace opacification/consolidation. Electronically Signed   By: Neita Garnet M.D.   On: 07/03/2023 12:57

## 2023-07-04 NOTE — Progress Notes (Signed)
 PROGRESS NOTE    Jade Perez  ZOX:[id number] DOB: 05/22/1955 DOA: 06/30/2023 PCP: Abner Greenspan, MD    Brief Narrative:  68 year old with history of HTN, HLD, DM2, OSA, depression, asthma, NSTEMI with normal coronaries admitted for shortness of breath and cough.  Outpatient diagnosed of left lower lobe pneumonia on 3/22 but returns due to worsening of her pain and discomfort.  CTA chest negative for PE but does show left lower lobe consolidation started on Rocephin and azithromycin.  Unfortunately repeat chest x-ray showed worsening of left-sided effusion therefore thoracentesis ordered.  Assessment & Plan:  Principal Problem:   CAP (community acquired pneumonia) Active Problems:   Intrinsic asthma   Type 2 diabetes mellitus without complication, without long-term current use of insulin (HCC)   Acute respiratory failure with hypoxia (HCC)   Hypokalemia   Hypertension   Depression   OSA (obstructive sleep apnea)   Hyperlipidemia, mixed    Left lower lobe community-acquired pneumonia Left-sided pleural effusion, worsening Failed outpatient therapy.  Leukocytosis slightly up today, CTA chest negative for PE but showed left-sided lower lobe consolidation therefore on IV antibiotics.  X-ray showed worsening left-sided pleural effusion, thoracentesis on 3/27 yielded 320 cc. Micro is neg.  Still has persistent large effusion concerning for loculated.  Will involve pulmonary as patient may require chest tube and tPA.  Getting RVP and CT chest for further evaluation -Ambulatory pulse ox   Hypokalemia/hypophosphatemia Replete as needed   Essential hypertension Norvasc.  IV as needed   Hyperlipidemia Statin   Diabetes mellitus type 2 - A1c was 6.5% in September 2023  -Holding home Ozempic.  Continue Amaryl Sliding scale and Accu-Cheks   History of asthma and OSA Bronchodilators   Depression - Celexa    Smudge cells - Smudge cells noted on peripheral smear, percentage not  specified  - Could be d/t the infection but she will need further workup with flow cytometry if leukocytosis persists   PT/OT-no follow-up needed Ambulatory pulse ox  DVT prophylaxis: Lovenox    Code Status: Full Code Family Communication: Husband at bedside Status is: Inpatient Remains inpatient appropriate because: Continue hospital side for abnormal breath sounds   Subjective:  Seen at bedside, after thoracentesis she tells me she feels slightly better in terms of breathing but still having quite a bit of cough and pleuritic chest pain.  Examination:  General exam: Appears calm and comfortable  Respiratory system: Left side diminished breath sounds, minimal improvement Cardiovascular system: S1 & S2 heard, RRR. No JVD, murmurs, rubs, gallops or clicks. No pedal edema. Gastrointestinal system: Abdomen is nondistended, soft and nontender. No organomegaly or masses felt. Normal bowel sounds heard. Central nervous system: Alert and oriented. No focal neurological deficits. Extremities: Symmetric 5 x 5 power. Skin: No rashes, lesions or ulcers Psychiatry: Judgement and insight appear normal. Mood & affect appropriate.                Diet Orders (From admission, onward)     Start     Ordered   06/30/23 0511  Diet regular Room service appropriate? Yes; Fluid consistency: Thin  Diet effective now       Question Answer Comment  Room service appropriate? Yes   Fluid consistency: Thin      06/30/23 0512            Objective: Vitals:   07/03/23 1420 07/03/23 2052 07/04/23 0439 07/04/23 0819  BP: (!) 140/63 (!) 144/71 (!) 157/89   Pulse:  90 84  Resp:   20   Temp:  99.1 F (37.3 C) 98.2 F (36.8 C)   TempSrc:  Oral Oral   SpO2:  95% 94% 92%  Weight:      Height:        Intake/Output Summary (Last 24 hours) at 07/04/2023 1015 Last data filed at 07/04/2023 0300 Gross per 24 hour  Intake 99.07 ml  Output --  Net 99.07 ml   Filed Weights   06/30/23 0046   Weight: 90.7 kg    Scheduled Meds:  amLODipine  5 mg Oral Daily   arformoterol  15 mcg Nebulization BID   atorvastatin  40 mg Oral QHS   citalopram  20 mg Oral Daily   enoxaparin (LOVENOX) injection  40 mg Subcutaneous Q24H   fenofibrate  160 mg Oral Daily   ferrous sulfate  325 mg Oral Q breakfast   glimepiride  0.5 mg Oral BID   insulin aspart  0-5 Units Subcutaneous QHS   insulin aspart  0-6 Units Subcutaneous TID WC   lidocaine  2 patch Transdermal Q24H   montelukast  10 mg Oral QHS   pantoprazole  40 mg Oral Daily   revefenacin  175 mcg Nebulization Daily   sodium chloride flush  3 mL Intravenous Q12H   Continuous Infusions:  Nutritional status     Body mass index is 36.58 kg/m.  Data Reviewed:   CBC: Recent Labs  Lab 06/28/23 2308 06/30/23 0103 07/01/23 0550 07/02/23 0556 07/03/23 0540 07/04/23 0510  WBC 19.6* 20.1* 16.4* 13.1* 15.6* 14.6*  NEUTROABS 16.3* 16.7*  --   --   --   --   HGB 14.6 13.0 11.6* 11.8* 12.5 11.9*  HCT 43.2 37.6 36.9 36.0 37.7 35.6*  MCV 88.7 89.5 96.1 93.8 90.8 91.0  PLT 309 266 260 321 335 337   Basic Metabolic Panel: Recent Labs  Lab 06/30/23 0804 07/01/23 0550 07/02/23 0556 07/03/23 0540 07/04/23 0510  NA 134* 131* 132* 133* 133*  K 3.3* 3.4* 3.6 3.4* 3.7  CL 101 97* 97* 97* 99  CO2 20* 22 20* 23 22  GLUCOSE 159* 126* 155* 186* 216*  BUN 17 18 16 13 13   CREATININE 0.72 0.78 0.67 0.64 0.59  CALCIUM 9.2 8.7* 8.6* 8.8* 8.7*  MG 1.6* 2.2 2.1 1.7 1.7  PHOS 1.9* 1.9*  --   --   --    GFR: Estimated Creatinine Clearance: 69.5 mL/min (by C-G formula based on SCr of 0.59 mg/dL). Liver Function Tests: Recent Labs  Lab 06/28/23 2308  AST 31  ALT 34  ALKPHOS 41  BILITOT 0.9  PROT 8.1  ALBUMIN 4.3   No results for input(s): "LIPASE", "AMYLASE" in the last 168 hours. No results for input(s): "AMMONIA" in the last 168 hours. Coagulation Profile: No results for input(s): "INR", "PROTIME" in the last 168 hours. Cardiac  Enzymes: No results for input(s): "CKTOTAL", "CKMB", "CKMBINDEX", "TROPONINI" in the last 168 hours. BNP (last 3 results) No results for input(s): "PROBNP" in the last 8760 hours. HbA1C: No results for input(s): "HGBA1C" in the last 72 hours. CBG: Recent Labs  Lab 07/03/23 0723 07/03/23 1216 07/03/23 1707 07/03/23 2053 07/04/23 0731  GLUCAP 192* 234* 240* 273* 193*   Lipid Profile: No results for input(s): "CHOL", "HDL", "LDLCALC", "TRIG", "CHOLHDL", "LDLDIRECT" in the last 72 hours. Thyroid Function Tests: No results for input(s): "TSH", "T4TOTAL", "FREET4", "T3FREE", "THYROIDAB" in the last 72 hours. Anemia Panel: No results for input(s): "VITAMINB12", "FOLATE", "FERRITIN", "TIBC", "IRON", "RETICCTPCT"  in the last 72 hours. Sepsis Labs: No results for input(s): "PROCALCITON", "LATICACIDVEN" in the last 168 hours.  Recent Results (from the past 240 hours)  Resp panel by RT-PCR (RSV, Flu A&B, Covid) Anterior Nasal Swab     Status: None   Collection Time: 06/28/23  7:16 PM   Specimen: Anterior Nasal Swab  Result Value Ref Range Status   SARS Coronavirus 2 by RT PCR NEGATIVE NEGATIVE Final    Comment: (NOTE) SARS-CoV-2 target nucleic acids are NOT DETECTED.  The SARS-CoV-2 RNA is generally detectable in upper respiratory specimens during the acute phase of infection. The lowest concentration of SARS-CoV-2 viral copies this assay can detect is 138 copies/mL. A negative result does not preclude SARS-Cov-2 infection and should not be used as the sole basis for treatment or other patient management decisions. A negative result may occur with  improper specimen collection/handling, submission of specimen other than nasopharyngeal swab, presence of viral mutation(s) within the areas targeted by this assay, and inadequate number of viral copies(<138 copies/mL). A negative result must be combined with clinical observations, patient history, and epidemiological information. The  expected result is Negative.  Fact Sheet for Patients:  BloggerCourse.com  Fact Sheet for Healthcare Providers:  SeriousBroker.it  This test is no t yet approved or cleared by the [place] FDA and  has been authorized for detection and/or diagnosis of SARS-CoV-2 by FDA under an Emergency Use Authorization (EUA). This EUA will remain  in effect (meaning this test can be used) for the duration of the COVID-19 declaration under Section 564(b)(1) of the Act, 21 U.S.C.section 360bbb-3(b)(1), unless the authorization is terminated  or revoked sooner.       Influenza A by PCR NEGATIVE NEGATIVE Final   Influenza B by PCR NEGATIVE NEGATIVE Final    Comment: (NOTE) The Xpert Xpress SARS-CoV-2/FLU/RSV plus assay is intended as an aid in the diagnosis of influenza from Nasopharyngeal swab specimens and should not be used as a sole basis for treatment. Nasal washings and aspirates are unacceptable for Xpert Xpress SARS-CoV-2/FLU/RSV testing.  Fact Sheet for Patients: BloggerCourse.com  Fact Sheet for Healthcare Providers: SeriousBroker.it  This test is not yet approved or cleared by the [place] FDA and has been authorized for detection and/or diagnosis of SARS-CoV-2 by FDA under an Emergency Use Authorization (EUA). This EUA will remain in effect (meaning this test can be used) for the duration of the COVID-19 declaration under Section 564(b)(1) of the Act, 21 U.S.C. section 360bbb-3(b)(1), unless the authorization is terminated or revoked.     Resp Syncytial Virus by PCR NEGATIVE NEGATIVE Final    Comment: (NOTE) Fact Sheet for Patients: BloggerCourse.com  Fact Sheet for Healthcare Providers: SeriousBroker.it  This test is not yet approved or cleared by the [place] FDA and has been authorized for detection and/or  diagnosis of SARS-CoV-2 by FDA under an Emergency Use Authorization (EUA). This EUA will remain in effect (meaning this test can be used) for the duration of the COVID-19 declaration under Section 564(b)(1) of the Act, 21 U.S.C. section 360bbb-3(b)(1), unless the authorization is terminated or revoked.  Performed at [facility], [address] [place], [place] [address]   Blood culture (routine x 2)     Status: None (Preliminary result)   Collection Time: 06/30/23  1:03 AM   Specimen: BLOOD  Result Value Ref Range Status   Specimen Description   Final    BLOOD LEFT ANTECUBITAL Performed at [facility], [address] [place], [place] [address]    Special Requests   Final    BOTTLES DRAWN AEROBIC AND ANAEROBIC Blood Culture adequate volume Performed at [facility] [place], [address] [place], [place] [address]    Culture   Final    NO GROWTH 4 DAYS Performed at [facility], [address] [place], [place] [address]    Report Status PENDING  Incomplete  Blood culture (routine x 2)     Status: None (Preliminary result)   Collection Time: 06/30/23  1:03 AM   Specimen: BLOOD RIGHT FOREARM  Result Value Ref Range Status   Specimen Description   Final    BLOOD RIGHT FOREARM Performed at [facility], [address] [place], [place] [address]    Special Requests   Final    BOTTLES DRAWN AEROBIC AND ANAEROBIC Blood Culture adequate volume Performed at [facility], [address] [place], [place] [address]    Culture   Final    NO GROWTH 4 DAYS Performed at [facility], [address] [place], [place] [address]    Report Status PENDING  Incomplete  Expectorated Sputum Assessment w Gram Stain, Rflx to Resp Cult     Status: None   Collection Time: 07/02/23  4:43 PM   Specimen: Expectorated Sputum  Result Value Ref Range Status   Specimen Description EXPECTORATED SPUTUM  Final   Special Requests NONE   Final   Sputum evaluation   Final    THIS SPECIMEN IS ACCEPTABLE FOR SPUTUM CULTURE Performed at [facility], [address] [place], [place] [address]    Report Status 07/02/2023 FINAL  Final  Culture, Respiratory w Gram Stain     Status: None (Preliminary result)   Collection Time: 07/02/23  4:43 PM  Result Value Ref Range Status   Specimen Description   Final    EXPECTORATED SPUTUM Performed at [facility], [address] [place], [place] [address]    Special Requests   Final    NONE Reflexed from [id number] Performed at [facility], [address] [place], [place] [address]    Gram Stain   Final    FEW WBC PRESENT, PREDOMINANTLY PMN FEW GRAM POSITIVE COCCI RARE YEAST WITH PSEUDOHYPHAE RARE GRAM NEGATIVE RODS    Culture   Final    TOO YOUNG TO READ Performed at [facility], [address], [place], [place] [address]    Report Status PENDING  Incomplete  Body fluid culture w Gram Stain     Status: None (Preliminary result)   Collection Time: 07/03/23  2:24 PM   Specimen: PATH Cytology Pleural fluid  Result Value Ref Range Status   Specimen Description   Final    PLEURAL Performed at [facility], [address] [place], [place] [address]    Special Requests   Final    NONE Performed at [facility], [address] [place], [place] [address]    Gram Stain   Final    NO WBC SEEN NO ORGANISMS SEEN Performed at [facility], [address] [place], [place] [address]    Culture PENDING  Incomplete   Report Status PENDING  Incomplete         Radiology Studies: DG Chest 1 View Result Date: 07/03/2023 CLINICAL DATA:  Status post left thoracentesis. EXAM: CHEST  1 VIEW COMPARISON:  07/03/2023 at 0903 hours FINDINGS: Aeration in the left lung has mildly improved. There continues to be a large amount of consolidation or pleural fluid in the mid and lower left  hemithorax. Negative for a pneumothorax. Right lung is clear. Cardiac silhouette is obscured by the densities in left lower chest. IMPRESSION: 1. Mild improvement in aeration in the left lung. Negative for a pneumothorax. 2. Large amount of consolidation or loculated pleural fluid in the left chest. Electronically Signed   By: Richarda Overlie M.D.   On: 07/03/2023 15:11   US THORACENTESIS ASP PLEURAL SPACE W/IMG GUIDE Result Date: 07/03/2023 INDICATION: Dyspnea.  Pneumonia. EXAM: ULTRASOUND GUIDED LEFT THORACENTESIS MEDICATIONS: None. COMPLICATIONS: None immediate. PROCEDURE: An ultrasound guided thoracentesis was thoroughly discussed with the patient and questions answered. The benefits, risks, alternatives and complications were also discussed. The patient understands and wishes to proceed with the procedure. Written consent was obtained. Ultrasound was performed to localize and mark an adequate pocket of fluid in the left posterior chest. The area was then prepped and draped in the normal sterile fashion. 1% Lidocaine was used for local anesthesia. Under ultrasound guidance a 6 Fr Safe-T-Centesis catheter was introduced. Thoracentesis was performed. The catheter was removed and a dressing applied. FINDINGS: A total of approximately 320 mL of clear yellow fluid was removed. Samples were sent to the laboratory as requested by the clinical team. Pleural catheter stopped draining despite ultrasound showing residual pleural fluid. Findings compatible with loculated pleural fluid. IMPRESSION: Successful ultrasound guided left thoracentesis yielding 320 mL of pleural fluid. Electronically Signed   By: Richarda Overlie M.D.   On: 07/03/2023 15:08   DG Chest Port 1 View Result Date: 07/03/2023 CLINICAL DATA:  Dyspnea. EXAM: PORTABLE CHEST 1 VIEW COMPARISON:  Chest two views 06/30/2023 and 06/28/2023; CT abdomen and pelvis 06/30/2023 FINDINGS: Interval worsening of homogeneous opacification of the inferior 80% of the left  hemithorax, likely a combination of pleural fluid and airspace opacification/consolidation. The right lung remains clear. The visualized cardiac silhouette and mediastinal contours are within limits. Mild-to-moderate atherosclerotic calcifications within the aortic arch. No pleural effusion. No acute skeletal abnormality. IMPRESSION: Interval worsening of homogeneous opacification of the inferior 80% of the left hemithorax, likely a combination of pleural fluid and airspace opacification/consolidation. Electronically Signed   By: Neita Garnet M.D.   On: 07/03/2023 12:57           LOS: 4 days   Time spent= 35 mins    Miguel Rota, MD [facility]  If 7PM-7AM, please contact night-coverage  07/04/2023, 10:15 AM

## 2023-07-04 NOTE — Progress Notes (Signed)
 MEDICATION-RELATED CONSULT NOTE   IR Procedure Consult - Anticoagulant/Antiplatelet PTA/Inpatient Med List Review by Pharmacist    Procedure: L chest tube placement    Completed: 3/28 @ 17:22  Post-Procedural bleeding risk per IR MD assessment: STANDARD  Antithrombotic medications on inpatient profile prior to procedure:  enoxaparin 40 mg SQ qAM PTA antithrombotic medications:  none    Recommended restart time per IR Post-Procedure Guidelines:  POD1 AM   Other considerations:  Lovenox orders still active; already timed for next dose tomorrow AM   Plan:     Continue enoxaparin 40 mg SQ daily as ordered  Bernadene Person, PharmD, BCPS [phone] 07/04/2023, 6:18 PM

## 2023-07-04 NOTE — Inpatient Diabetes Management (Signed)
 Inpatient Diabetes Program Recommendations  AACE/ADA: New Consensus Statement on Inpatient Glycemic Control (2015)  Target Ranges:  Prepandial:   less than 140 mg/dL      Peak postprandial:   less than 180 mg/dL (1-2 hours)      Critically ill patients:  140 - 180 mg/dL   Lab Results  Component Value Date   GLUCAP 193 (H) 07/04/2023   HGBA1C 8.1 (H) 07/01/2023    Review of Glycemic Control  Latest Reference Range & Units 07/02/23 21:09 07/03/23 07:23 07/03/23 12:16 07/03/23 17:07 07/03/23 20:53 07/04/23 07:31  Glucose-Capillary 70 - 99 mg/dL 161 (H) 096 (H) 045 (H) 240 (H) 273 (H) 193 (H)  (H): Data is abnormally high Diabetes history: Type 2 DM Outpatient Diabetes medications: Farxiga 10 mg every day, Amaryl 0.5 mg BID, Ozempic 2 mg Qwk Current orders for Inpatient glycemic control: Novolog 0-6 units TID & HS, Amaryl 0.5 mg BID  Inpatient Diabetes Program Recommendations:    Consider adding Semglee 10 units every day.  Thanks, Lujean Rave, MSN, RNC-OB Diabetes Coordinator [phone] (8a-5p)

## 2023-07-04 NOTE — Progress Notes (Signed)
 Occupational Therapy Treatment Patient Details Name: Jade Perez MRN: [id number] DOB: July 27, 1955 Today's Date: 07/04/2023   History of present illness 68 yo female admitted with Pna. Hx of DM, OSA, depression, asthma, NSTEMI, RCR   OT comments  Pt making progress with functional goals, but continues to have SOB, DOE during minimal exertion during functional tasks. O2 SATs 93% on 3L before activity, dropped to 85% during in room ADL activity on RA. Pt placed back on O2 and with deep, pursed lip breathing, SATs recovered to 91% within 30 seconds. OT will continue to follow acutely to maximize level of function and safety       If plan is discharge home, recommend the following:  A little help with bathing/dressing/bathroom;Assistance with cooking/housework;Assist for transportation   Equipment Recommendations  Tub/shower seat    Recommendations for Other Services      Precautions / Restrictions Precautions Precaution/Restrictions Comments: monitor O2 Restrictions Weight Bearing Restrictions Per Provider Order: No       Mobility Bed Mobility               General bed mobility comments: pt in recliner upon OT arrival    Transfers Overall transfer level: Needs assistance Equipment used: None, 1 person hand held assist Transfers: Sit to/from Stand Sit to Stand: Supervision           General transfer comment: HHA waking back from bathroom     Balance Overall balance assessment: Needs assistance Sitting-balance support: No upper extremity supported, Feet supported Sitting balance-Leahy Scale: Good     Standing balance support: During functional activity, No upper extremity supported Standing balance-Leahy Scale: Fair                             ADL either performed or assessed with clinical judgement   ADL Overall ADL's : Needs assistance/impaired     Grooming: Wash/dry hands;Wash/dry face;Contact guard assist;Supervision/safety;Standing        Lower Body Bathing: Contact guard assist;Sit to/from stand Lower Body Bathing Details (indicate cue type and reason): simulated         Toilet Transfer: Supervision/safety;Ambulation;Regular Toilet   Toileting- Architect and Hygiene: Supervision/safety;Sit to/from stand       Functional mobility during ADLs: Supervision/safety General ADL Comments: O2 SATs 93% on 3L before activity, dropped to 85% during in room activity on RA. Pt placed back on O2 and with deep, pursed lip breathing, SATs recovered to 91% within 30 seconds    Extremity/Trunk Assessment Upper Extremity Assessment Upper Extremity Assessment: Overall WFL for tasks assessed   Lower Extremity Assessment Lower Extremity Assessment: Defer to PT evaluation   Cervical / Trunk Assessment Cervical / Trunk Assessment: Normal    Vision Baseline Vision/History: 1 Wears glasses Ability to See in Adequate Light: 0 Adequate Patient Visual Report: No change from baseline     Perception     Praxis     Communication Communication Communication: No apparent difficulties   Cognition Arousal: Alert Behavior During Therapy: WFL for tasks assessed/performed Cognition: No apparent impairments                               Following commands: Intact        Cueing      Exercises      Shoulder Instructions       General Comments      Pertinent Vitals/ Pain  Pain Assessment Pain Assessment: Faces Faces Pain Scale: Hurts a little bit Pain Location: chest fro coughing Pain Descriptors / Indicators: Sore Pain Intervention(s): Monitored during session, Repositioned  Home Living                                          Prior Functioning/Environment              Frequency  Min 1X/week        Progress Toward Goals  OT Goals(current goals can now be found in the care plan section)  Progress towards OT goals: Progressing toward goals     Plan       Co-evaluation                 AM-PAC OT "6 Clicks" Daily Activity     Outcome Measure   Help from another person eating meals?: None Help from another person taking care of personal grooming?: A Little Help from another person toileting, which includes using toliet, bedpan, or urinal?: A Little Help from another person bathing (including washing, rinsing, drying)?: A Little Help from another person to put on and taking off regular upper body clothing?: A Little Help from another person to put on and taking off regular lower body clothing?: A Little 6 Click Score: 19    End of Session    OT Visit Diagnosis: Muscle weakness (generalized) (M62.81);Other abnormalities of gait and mobility (R26.89)   Activity Tolerance Patient limited by fatigue;Other (comment) (SOB, DOE)   Patient Left with call bell/phone within reach;in chair;with family/visitor present   Nurse Communication          Time: 1206-1222 OT Time Calculation (min): 16 min  Charges: OT General Charges $OT Visit: 1 Visit OT Treatments $Self Care/Home Management : 8-22 mins    Galen Manila 07/04/2023, 12:45 PM

## 2023-07-04 NOTE — Plan of Care (Signed)
  Problem: Education: Goal: Knowledge of General Education information will improve Description: Including pain rating scale, medication(s)/side effects and non-pharmacologic comfort measures 07/04/2023 1806 by Kathleene Hazel, RN Outcome: Progressing 07/04/2023 1037 by Kathleene Hazel, RN Outcome: Progressing   Problem: Clinical Measurements: Goal: Ability to maintain clinical measurements within normal limits will improve Outcome: Progressing   Problem: Nutrition: Goal: Adequate nutrition will be maintained Outcome: Progressing

## 2023-07-04 NOTE — Progress Notes (Signed)
   07/04/23 2007  BiPAP/CPAP/SIPAP  BiPAP/CPAP/SIPAP Pt Type Adult  Reason BIPAP/CPAP not in use Non-compliant (Pt stated she wanted to wear O2 tonight and not cpap.)

## 2023-07-04 NOTE — Plan of Care (Signed)
   Problem: Education: Goal: Knowledge of General Education information will improve Description: Including pain rating scale, medication(s)/side effects and non-pharmacologic comfort measures Outcome: Progressing   Problem: Nutrition: Goal: Adequate nutrition will be maintained Outcome: Progressing

## 2023-07-04 NOTE — Procedures (Signed)
 Interventional Radiology Procedure Note  Procedure: Left chest tube placement, 60F  Complications: None  Estimated Blood Loss: None  Recommendations: - Chest tube to LWS   Signed,  Sterling Big, MD

## 2023-07-04 NOTE — Consult Note (Signed)
 Chief Complaint: cough, dyspnea, chest pain, pneumonia, loculated left pleural effusion with concern for empyema; referred for image guided left chest drain placement  Referring Provider(s): Ramaswamy,M  Supervising Physician: Malachy Moan  Patient Status: [facility] - In-pt  History of Present Illness: Jade Perez is a 68 y.o. female with PMH sig for asthma, depression, anxiety, GERD, hiatal hernia, nephrolithiasis, prior MI, hyperlipidemia, hypertension, anemia, osteoarthritis, obstructive sleep apnea, diabetes who presented to [facility] on 3/24 with dyspnea, cough, pleuritic left chest pain.  CT angio of the chest on 3/24 showed no PE; there was left lower lung consolidation likely representing lobar pneumonia.  Also noted was esophageal hiatal hernia, fatty liver.  Respiratory virus panel for COVID, flu and RSV was negative.  Patient underwent left thoracentesis yesterday yielding 320 cc of fluid-culture negative to date, cytology pending.  Blood culture negative to date.  Patient afebrile, WBC 14.6, hemoglobin 11.9, platelets normal, creatinine normal, PT/INR pending.  Due to persistent chest discomfort, coughing,  dyspnea with exertion pt underwent follow-up CT chest today with preliminary findings showing persistent left lower lobe consolidation with loculated effusion. Request now received from pulmonology for image guided left chest drain placement.   Patient is Full Code  Past Medical History:  Diagnosis Date   Allergic rhinitis    Asthma, mild intermittent    followed by pcp    (prevously followed by dr Lucie Leather w/ asthma/ allergy center)   Complication of anesthesia    difficulty waking up   Depression    GAD (generalized anxiety disorder)    GERD (gastroesophageal reflux disease)    Hiatal hernia    History of cardiomyopathy in adulthood 12/06/2021   in setting elevated troponin's w/ NSTEMI;  per echo  12-07-2021  ef 50-55%   History of heart murmur in  childhood    since birth very slight   History of kidney stones    History of non-ST elevation myocardial infarction (NSTEMI) 12/06/2021   admission in epic---  elevated troponin's w/ NSTEMI  and stress cardiomyopathy,  12-07-2021 per cath normal coronaries ;  cardiology hospital follow-up with Bernadene Person NP 12-26-2021   Hyperlipidemia, mixed    Hypertension    IDA (iron deficiency anemia)    OA (osteoarthritis)    thumb and fingers, knee, right hip   OSA (obstructive sleep apnea)    12-27-2021  per pt OSA yrs ago last used cpap approx. 2013,  stated retested this year and is waiting on cpap machine to arrive   Renal disorder    Type 2 diabetes mellitus (HCC) 2008   followed by pcp   (12-27-2021  pt sated checks blood sugar am daily fasting, average 155-170)   Wears glasses     Past Surgical History:  Procedure Laterality Date   BREAST EXCISIONAL BIOPSY Right    x2  last one 1980s , benign   CARPAL TUNNEL RELEASE Right 05/22/2017   Procedure: RIGHT CARPAL TUNNEL RELEASE;  Surgeon: Cindee Salt, MD;  Location: [facility];  Service: Orthopedics;  Laterality: Right;   CARPAL TUNNEL RELEASE Left 03/12/2018   Procedure: LEFT CARPAL TUNNEL RELEASE;  Surgeon: Cindee Salt, MD;  Location: [facility];  Service: Orthopedics;  Laterality: Left;   CYSTOSCOPY W/ URETERAL STENT PLACEMENT Bilateral 12/11/2021   Procedure: CYSTOSCOPY , BILATERAL RETROGRADE , BILATERAL STENT PLACEMENT;  Surgeon: Sebastian Ache, MD;  Location[facility];  Service: Urology;  Laterality: Bilateral;   CYSTOSCOPY WITH RETROGRADE PYELOGRAM, URETEROSCOPY AND STENT PLACEMENT  Bilateral 01/02/2022   Procedure: CYSTOSCOPY WITH RETROGRADE PYELOGRAM, URETEROSCOPY AND STENT REPLACEMENT;  Surgeon: Sebastian Ache, MD;  Location: [facility];  Service: Urology;  Laterality: Bilateral;   DIAGNOSTIC LAPAROSCOPY     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   DILATION AND CURETTAGE OF UTERUS     yrs ago for missed  ab   EXTRACORPOREAL SHOCK WAVE LITHOTRIPSY  03/2017   HOLMIUM LASER APPLICATION Bilateral 01/02/2022   Procedure: HOLMIUM LASER APPLICATION;  Surgeon: Sebastian Ache, MD;  Location: [facility];  Service: Urology;  Laterality: Bilateral;   LAPAROSCOPIC APPENDECTOMY  2008   LEFT HEART CATH AND CORONARY ANGIOGRAPHY N/A 12/07/2021   Procedure: LEFT HEART CATH AND CORONARY ANGIOGRAPHY;  Surgeon: Lyn Records, MD;  Location: [facility];  Service: Cardiovascular;  Laterality: N/A;   LIPOMA EXCISION  2003   right shoulder   SCLERAL BUCKLE  10/15/2011   Procedure: SCLERAL BUCKLE;  Surgeon: Sherrie George, MD;  Location: [facility] OR;  Service: Ophthalmology;  Laterality: Right;   SHOULDER ARTHROSCOPY WITH ROTATOR CUFF REPAIR Right 03/25/2019   Procedure: Right shoulder arthroscopy, subacromial decompression, distal clavicle resection, rotator cuff repair;  Surgeon: Francena Hanly, MD;  Location: [facility];  Service: Orthopedics;  Laterality: Right;    WISDOM TOOTH EXTRACTION      Allergies: Amoxicillin, Aspirin, Bee venom, Clarithromycin, Erythromycin, Ibuprofen, Oxycodone, Sulfa antibiotics, Latex, and Tape  Medications: Prior to Admission medications   Medication Sig Start Date End Date Taking? Authorizing Provider  acetaminophen (TYLENOL) 500 MG tablet Take 500 mg by mouth every 6 (six) hours as needed for mild pain.   Yes [provider]  albuterol (PROAIR HFA) 108 (90 Base) MCG/ACT inhaler Can inhale two puffs every four to six hours as needed for cough or wheeze. 06/08/20  Yes Kozlow, Alvira Philips, MD  amLODipine (NORVASC) 5 MG tablet Take 1 tablet (5 mg total) by mouth daily. Patient taking differently: Take 5 mg by mouth daily. 12/16/21  Yes Regalado, Belkys A, MD  atorvastatin (LIPITOR) 40 MG tablet Take 40 mg by mouth at bedtime.   Yes [provider]  b complex vitamins tablet Take 1 tablet by mouth at bedtime.   Yes [provider]  cephALEXin  (KEFLEX) 500 MG capsule Take 1 capsule (500 mg total) by mouth 3 (three) times daily. 06/29/23  Yes Delo, Riley Lam, MD  cetirizine (ZYRTEC) 10 MG tablet Take 10 mg by mouth daily.   Yes [provider]  citalopram (CELEXA) 40 MG tablet Take 20 mg by mouth daily.   Yes [provider]  FARXIGA 10 MG TABS tablet Take 10 mg by mouth daily. 05/21/23  Yes [provider]  fenofibrate (TRICOR) 145 MG tablet Take 145 mg by mouth at bedtime. 09/22/19  Yes [provider]  Ferrous Sulfate (SLOW FE PO) Take 1 tablet by mouth daily.   Yes [provider]  glimepiride (AMARYL) 1 MG tablet Take 0.5 mg by mouth 2 (two) times daily. 02/15/19  Yes [provider]  indapamide (LOZOL) 1.25 MG tablet Take 1.25 mg by mouth daily. 03/10/23  Yes [provider]  montelukast (SINGULAIR) 10 MG tablet TAKE 1 TABLET BY MOUTH EVERY DAY Patient taking differently: Take 10 mg by mouth at bedtime. 07/19/19  Yes Kozlow, Alvira Philips, MD  nystatin cream (MYCOSTATIN) Apply 1 Application topically as needed for dry skin (itching, burning, redness). 05/02/23  Yes [provider]  omeprazole (PRILOSEC) 40 MG capsule TAKE 1 CAPSULE  BY MOUTH EVERY DAY IN THE MORNING BEFORE BREAKFAST Patient taking differently: Take 40 mg by mouth daily. 07/30/21  Yes Kozlow, Alvira Philips, MD  Semaglutide, 1 MG/DOSE, 2 MG/1.5ML SOPN Inject 2 mg into the skin once a week. Wednesday's 05/25/20  Yes [provider]     Family History  Problem Relation Age of Onset   Unexplained death Sister 9       died in her sleep   Colon cancer Paternal Grandmother    Colon cancer Paternal Uncle    Allergic rhinitis Neg Hx    Angioedema Neg Hx    Asthma Neg Hx    Atopy Neg Hx    Eczema Neg Hx    Immunodeficiency Neg Hx    Urticaria Neg Hx    Colon polyps Neg Hx    Esophageal cancer Neg Hx    Stomach cancer Neg Hx    Rectal cancer Neg Hx    Breast cancer Neg Hx     Social History    Socioeconomic History   Marital status: Married    Spouse name: Not on file   Number of children: Not on file   Years of education: Not on file   Highest education level: Not on file  Occupational History   Not on file  Tobacco Use   Smoking status: Never   Smokeless tobacco: Never  Vaping Use   Vaping status: Never Used  Substance and Sexual Activity   Alcohol use: No   Drug use: Never   Sexual activity: Not on file  Other Topics Concern   Not on file  Social History Narrative   ** Merged History Encounter **       Social Drivers of Health   Financial Resource Strain: Not on file  Food Insecurity: Food Insecurity Present (06/30/2023)   Hunger Vital Sign    Worried About Running Out of Food in the Last Year: Sometimes true    Ran Out of Food in the Last Year: Sometimes true  Transportation Needs: No Transportation Needs (06/30/2023)   PRAPARE - Administrator, Civil Service (Medical): No    Lack of Transportation (Non-Medical): No  Physical Activity: Not on file  Stress: Not on file  Social Connections: Socially Integrated (06/30/2023)   Social Connection and Isolation Panel [NHANES]    Frequency of Communication with Friends and Family: More than three times a week    Frequency of Social Gatherings with Friends and Family: Once a week    Attends Religious Services: More than 4 times per year    Active Member of [employer] or Organizations: Yes    Attends Engineer, structural: More than 4 times per year    Marital Status: Married      Review of Systems; see above;  denies fever, headache, abdominal pain, vomiting or bleeding.  Vital Signs: BP (!) 157/89 (BP Location: Left Arm)   Pulse 84   Temp 98.2 F (36.8 C) (Oral)   Resp 20   Ht 5\' 2"  (1.575 m)   Wt 200 lb (90.7 kg)   SpO2 92%   BMI 36.58 kg/m   Advance Care Plan: No documents on file.  Physical Exam awake, alert.  Chest with diminished breath sounds /few rhonchi/basilar crackles  left, right clear.  Heart with normal rate, some occasional pauses.  Abdomen soft, positive bowel sounds, nontender.  No lower extremity edema.  Imaging: DG Chest 1 View Result Date: 07/03/2023 CLINICAL DATA:  Status post left  thoracentesis. EXAM: CHEST  1 VIEW COMPARISON:  07/03/2023 at 0903 hours FINDINGS: Aeration in the left lung has mildly improved. There continues to be a large amount of consolidation or pleural fluid in the mid and lower left hemithorax. Negative for a pneumothorax. Right lung is clear. Cardiac silhouette is obscured by the densities in left lower chest. IMPRESSION: 1. Mild improvement in aeration in the left lung. Negative for a pneumothorax. 2. Large amount of consolidation or loculated pleural fluid in the left chest. Electronically Signed   By: Richarda Overlie M.D.   On: 07/03/2023 15:11   US THORACENTESIS ASP PLEURAL SPACE W/IMG GUIDE Result Date: 07/03/2023 INDICATION: Dyspnea.  Pneumonia. EXAM: ULTRASOUND GUIDED LEFT THORACENTESIS MEDICATIONS: None. COMPLICATIONS: None immediate. PROCEDURE: An ultrasound guided thoracentesis was thoroughly discussed with the patient and questions answered. The benefits, risks, alternatives and complications were also discussed. The patient understands and wishes to proceed with the procedure. Written consent was obtained. Ultrasound was performed to localize and mark an adequate pocket of fluid in the left posterior chest. The area was then prepped and draped in the normal sterile fashion. 1% Lidocaine was used for local anesthesia. Under ultrasound guidance a 6 Fr Safe-T-Centesis catheter was introduced. Thoracentesis was performed. The catheter was removed and a dressing applied. FINDINGS: A total of approximately 320 mL of clear yellow fluid was removed. Samples were sent to the laboratory as requested by the clinical team. Pleural catheter stopped draining despite ultrasound showing residual pleural fluid. Findings compatible with loculated  pleural fluid. IMPRESSION: Successful ultrasound guided left thoracentesis yielding 320 mL of pleural fluid. Electronically Signed   By: Richarda Overlie M.D.   On: 07/03/2023 15:08   DG Chest Port 1 View Result Date: 07/03/2023 CLINICAL DATA:  Dyspnea. EXAM: PORTABLE CHEST 1 VIEW COMPARISON:  Chest two views 06/30/2023 and 06/28/2023; CT abdomen and pelvis 06/30/2023 FINDINGS: Interval worsening of homogeneous opacification of the inferior 80% of the left hemithorax, likely a combination of pleural fluid and airspace opacification/consolidation. The right lung remains clear. The visualized cardiac silhouette and mediastinal contours are within limits. Mild-to-moderate atherosclerotic calcifications within the aortic arch. No pleural effusion. No acute skeletal abnormality. IMPRESSION: Interval worsening of homogeneous opacification of the inferior 80% of the left hemithorax, likely a combination of pleural fluid and airspace opacification/consolidation. Electronically Signed   By: Neita Garnet M.D.   On: 07/03/2023 12:57   CT Angio Chest PE W and/or Wo Contrast Result Date: 06/30/2023 CLINICAL DATA:  Syncope or presyncope with cerebrovascular cause suspected. Increasing pain to the left lung and ribs. Worsening shortness of breath. Diagnosed yesterday with pneumonia. EXAM: CT ANGIOGRAPHY CHEST WITH CONTRAST TECHNIQUE: Multidetector CT imaging of the chest was performed using the standard protocol during bolus administration of intravenous contrast. Multiplanar CT image reconstructions and MIPs were obtained to evaluate the vascular anatomy. RADIATION DOSE REDUCTION: This exam was performed according to the departmental dose-optimization program which includes automated exposure control, adjustment of the mA and/or kV according to patient size and/or use of iterative reconstruction technique. CONTRAST:  75mL OMNIPAQUE IOHEXOL 350 MG/ML SOLN COMPARISON:  Chest radiograph 06/30/2023. FINDINGS: Cardiovascular:  Technically adequate study with good opacification of the central and segmental pulmonary arteries. No focal filling defects. No evidence of significant pulmonary embolus. Normal heart size. No pericardial effusions. Normal caliber thoracic aorta. No aortic dissection. Minimal aortic and coronary artery calcifications. Mediastinum/Nodes: Moderate esophageal hiatal hernia. Esophagus is decompressed. No significant lymphadenopathy. Thyroid gland is unremarkable. Lungs/Pleura: Dense consolidation in the left lower  lung likely representing lobar pneumonia. Right lung is clear. No pleural effusion or pneumothorax. Upper Abdomen: Diffuse fatty infiltration of the liver. No acute changes. Musculoskeletal: No chest wall abnormality. No acute or significant osseous findings. Review of the MIP images confirms the above findings. IMPRESSION: 1. No evidence of significant pulmonary embolus. 2. Left lower lung consolidation likely representing lobar pneumonia. Follow-up to resolution is recommended to exclude underlying obstructing lesion. 3. Esophageal hiatal hernia. 4. Fatty infiltration of the liver. 5. Minimal aortic atherosclerosis. Electronically Signed   By: Burman Nieves M.D.   On: 06/30/2023 03:59   DG Chest 2 View Result Date: 06/30/2023 CLINICAL DATA:  Chest pain shortness of breath EXAM: CHEST - 2 VIEW COMPARISON:  06/28/2023 FINDINGS: Increasing left basilar airspace opacity is noted. Small left pleural effusion is now noted. Cardiac shadow is stable. Right lung remains clear. IMPRESSION: Increasing left lower lobe infiltrate with associated small effusion. Electronically Signed   By: Alcide Clever M.D.   On: 06/30/2023 02:32   DG Chest 2 View Result Date: 06/28/2023 CLINICAL DATA:  Cough Recent flu that turned into a sinus infection. Facial pressure and headache today, not controlled w/ tylenol. Crackles in left base. Non smoker. Asthmatic. Tested neg twice for strep/rsv/covid. EXAM: CHEST - 2 VIEW  COMPARISON:  Chest x-ray 12/31/2021 FINDINGS: The heart and mediastinal contours are within normal limits. Question developing left lower lobe airspace opacity. No pulmonary edema. No pleural effusion. No pneumothorax. No acute osseous abnormality. IMPRESSION: question developing left lower lobe airspace opacity. Electronically Signed   By: Tish Frederickson M.D.   On: 06/28/2023 23:14    Labs:  CBC: Recent Labs    07/01/23 0550 07/02/23 0556 07/03/23 0540 07/04/23 0510  WBC 16.4* 13.1* 15.6* 14.6*  HGB 11.6* 11.8* 12.5 11.9*  HCT 36.9 36.0 37.7 35.6*  PLT 260 321 335 337    COAGS: No results for input(s): "INR", "APTT" in the last 8760 hours.  BMP: Recent Labs    07/01/23 0550 07/02/23 0556 07/03/23 0540 07/04/23 0510  NA 131* 132* 133* 133*  K 3.4* 3.6 3.4* 3.7  CL 97* 97* 97* 99  CO2 22 20* 23 22  GLUCOSE 126* 155* 186* 216*  BUN 18 16 13 13   CALCIUM 8.7* 8.6* 8.8* 8.7*  CREATININE 0.78 0.67 0.64 0.59  GFRNONAA >60 >60 >60 >60    LIVER FUNCTION TESTS: Recent Labs    06/28/23 2308  BILITOT 0.9  AST 31  ALT 34  ALKPHOS 41  PROT 8.1  ALBUMIN 4.3    TUMOR MARKERS: No results for input(s): "AFPTM", "CEA", "CA199", "CHROMGRNA" in the last 8760 hours.  Assessment and Plan: 68 y.o. female with PMH sig for asthma, depression, anxiety, GERD, hiatal hernia, nephrolithiasis, prior MI, hyperlipidemia, hypertension, anemia, osteoarthritis, obstructive sleep apnea, diabetes who presented to [facility] 3/24 with dyspnea, cough, pleuritic left chest pain.  CT angio of the chest on 3/24 showed no PE; there was left lower lung consolidation likely representing lobar pneumonia.  Also noted was esophageal hiatal hernia, fatty liver.  Respiratory virus panel for COVID, flu and RSV was negative.  Patient underwent left thoracentesis yesterday yielding 320 cc of fluid-culture negative to date, cytology pending.  Blood culture negative to date.  Patient afebrile, WBC 14.6,  hemoglobin 11.9, platelets normal, creatinine normal, PT/INR pending.  Due to persistent chest discomfort, coughing,  dyspnea with exertion pt underwent follow-up CT chest today with preliminary findings showing persistent left lower lobe consolidation with loculated  effusion. Request now received from pulmonology for image guided left chest drain placement.  Latest imaging studies have been reviewed by Dr. Archer Asa.Risks and benefits discussed with the patient/spouse  including bleeding, infection, damage to adjacent structures, pneumothorax and sepsis.  All of the patient's questions were answered, patient is agreeable to proceed. Consent signed and in chart.  Procedure tentatively planned for later today or tomorrow  Thank you for allowing our service to participate in Chardai Gangemi 's care.  Electronically Signed: D. Jeananne Rama, PA-C   07/04/2023, 1:43 PM      I spent a total of 40 Minutes    in face to face in clinical consultation, greater than 50% of which was counseling/coordinating care for image guided left chest drain placement

## 2023-07-05 ENCOUNTER — Inpatient Hospital Stay (HOSPITAL_COMMUNITY)

## 2023-07-05 DIAGNOSIS — J90 Pleural effusion, not elsewhere classified: Secondary | ICD-10-CM | POA: Diagnosis not present

## 2023-07-05 DIAGNOSIS — J189 Pneumonia, unspecified organism: Secondary | ICD-10-CM | POA: Diagnosis not present

## 2023-07-05 LAB — CBC
HCT: 38.2 % (ref 36.0–46.0)
Hemoglobin: 12.2 g/dL (ref 12.0–15.0)
MCH: 29.6 pg (ref 26.0–34.0)
MCHC: 31.9 g/dL (ref 30.0–36.0)
MCV: 92.7 fL (ref 80.0–100.0)
Platelets: 393 10*3/uL (ref 150–400)
RBC: 4.12 MIL/uL (ref 3.87–5.11)
RDW: 14.3 % (ref 11.5–15.5)
WBC: 14 10*3/uL — ABNORMAL HIGH (ref 4.0–10.5)
nRBC: 0 % (ref 0.0–0.2)

## 2023-07-05 LAB — GLUCOSE, CAPILLARY
Glucose-Capillary: 153 mg/dL — ABNORMAL HIGH (ref 70–99)
Glucose-Capillary: 209 mg/dL — ABNORMAL HIGH (ref 70–99)
Glucose-Capillary: 218 mg/dL — ABNORMAL HIGH (ref 70–99)
Glucose-Capillary: 119 mg/dL — ABNORMAL HIGH (ref 70–99)

## 2023-07-05 LAB — BASIC METABOLIC PANEL WITH GFR
Anion gap: 13 (ref 5–15)
BUN: 14 mg/dL (ref 8–23)
CO2: 22 mmol/L (ref 22–32)
Calcium: 8.8 mg/dL — ABNORMAL LOW (ref 8.9–10.3)
Chloride: 100 mmol/L (ref 98–111)
Creatinine, Ser: 0.62 mg/dL (ref 0.44–1.00)
GFR, Estimated: >60 mL/min (ref >60)
Glucose, Bld: 155 mg/dL — ABNORMAL HIGH (ref 70–99)
Potassium: 4 mmol/L (ref 3.5–5.1)
Sodium: 135 mmol/L (ref 135–145)

## 2023-07-05 LAB — PROCALCITONIN: Procalcitonin: 0.78 ng/mL

## 2023-07-05 LAB — CULTURE, BLOOD (ROUTINE X 2)
Culture: NO GROWTH
Culture: NO GROWTH
Special Requests: ADEQUATE
Special Requests: ADEQUATE

## 2023-07-05 LAB — MAGNESIUM: Magnesium: 1.7 mg/dL (ref 1.7–2.4)

## 2023-07-05 MED ORDER — STERILE WATER FOR INJECTION IJ SOLN
5.0000 mg | Freq: Once | RESPIRATORY_TRACT | Status: AC
  Administered 2023-07-05: 5 mg via INTRAPLEURAL
  Filled 2023-07-05: qty 5

## 2023-07-05 MED ORDER — STERILE WATER FOR INJECTION IJ SOLN
5.0000 mg | Freq: Once | RESPIRATORY_TRACT | Status: DC
  Filled 2023-07-05: qty 5

## 2023-07-05 MED ORDER — SODIUM CHLORIDE (PF) 0.9 % IJ SOLN
10.0000 mg | Freq: Once | INTRAMUSCULAR | Status: DC
  Filled 2023-07-05: qty 10

## 2023-07-05 MED ORDER — SODIUM CHLORIDE (PF) 0.9 % IJ SOLN
10.0000 mg | Freq: Once | INTRAMUSCULAR | Status: AC
  Administered 2023-07-05: 10 mg via INTRAPLEURAL
  Filled 2023-07-05: qty 10

## 2023-07-05 MED ORDER — SODIUM CHLORIDE 0.9% FLUSH
10.0000 mL | Freq: Three times a day (TID) | INTRAVENOUS | Status: DC
Start: 2023-07-05 — End: 2023-07-12
  Administered 2023-07-05 – 2023-07-11 (×18): 10 mL via INTRAPLEURAL

## 2023-07-05 MED ORDER — GUAIFENESIN 100 MG/5ML PO LIQD
10.0000 mL | Freq: Four times a day (QID) | ORAL | Status: DC
  Administered 2023-07-05 – 2023-07-12 (×26): 10 mL via ORAL
  Filled 2023-07-05 (×25): qty 10

## 2023-07-05 NOTE — Progress Notes (Signed)
 Patient up to chair and using BSC. Lots of encouragement and reminding to use IS.

## 2023-07-05 NOTE — Procedures (Signed)
 Pleural Fibrinolytic Administration Procedure Note  Jade Perez  [id number]  Aug 20, 1955  Date:07/05/23  Time:4:28 PM   Provider Performing:Moe Brier R Jiovanny Burdell   Procedure: Pleural Fibrinolysis Initial day [phone])  Indication(s) Fibrinolysis of complicated pleural effusion  Consent Risks of the procedure as well as the alternatives and risks of each were explained to the patient and/or caregiver.  Consent for the procedure was obtained.   Anesthesia None   Time Out Verified patient identification, verified procedure, site/side was marked, verified correct patient position, special equipment/implants available, medications/allergies/relevant history reviewed, required imaging and test results available.   Sterile Technique Hand hygiene, gloves   Procedure Description Existing pleural catheter was cleaned and accessed in sterile manner.  10mg  of tPA in 30cc of saline and 5mg  of dornase in 30cc of sterile water were injected into pleural space using existing pleural catheter.  Catheter will be clamped for 1 hour and then placed back to suction - discussed with bedside RN to unclamp at 515 pm.   Complications/Tolerance None; patient tolerated the procedure well.  EBL None   Specimen(s) None

## 2023-07-05 NOTE — Progress Notes (Signed)
 Referring Physician(s): Kalman Shan, MD  Supervising Physician: Richarda Overlie  Patient Status:  [facility], [place] - In-pt  Chief Complaint: Follow up left chest tube placement 07/04/23 in IR  Subjective:  Patient seen in her room this morning, relaxing in chair - states she feels very good today, quite comfortable in chair and even dosed off for a little while. Breathing is much better. She states pulmonology considering "breaking up the bubbles with medicine through the chest tube." No concerns regarding chest tube.   Allergies: Amoxicillin, Aspirin, Bee venom, Clarithromycin, Erythromycin, Ibuprofen, Oxycodone, Sulfa antibiotics, Latex, and Tape  Medications: Prior to Admission medications   Medication Sig Start Date End Date Taking? Authorizing Provider  acetaminophen (TYLENOL) 500 MG tablet Take 500 mg by mouth every 6 (six) hours as needed for mild pain.   Yes [provider]  albuterol (PROAIR HFA) 108 (90 Base) MCG/ACT inhaler Can inhale two puffs every four to six hours as needed for cough or wheeze. 06/08/20  Yes Kozlow, Alvira Philips, MD  amLODipine (NORVASC) 5 MG tablet Take 1 tablet (5 mg total) by mouth daily. Patient taking differently: Take 5 mg by mouth daily. 12/16/21  Yes Regalado, Belkys A, MD  atorvastatin (LIPITOR) 40 MG tablet Take 40 mg by mouth at bedtime.   Yes [provider]  b complex vitamins tablet Take 1 tablet by mouth at bedtime.   Yes [provider]  cephALEXin (KEFLEX) 500 MG capsule Take 1 capsule (500 mg total) by mouth 3 (three) times daily. 06/29/23  Yes Delo, Riley Lam, MD  cetirizine (ZYRTEC) 10 MG tablet Take 10 mg by mouth daily.   Yes [provider]  citalopram (CELEXA) 40 MG tablet Take 20 mg by mouth daily.   Yes [provider]  FARXIGA 10 MG TABS tablet Take 10 mg by mouth daily. 05/21/23  Yes [provider]  fenofibrate (TRICOR) 145 MG tablet Take 145 mg by mouth at bedtime. 09/22/19  Yes [provider]  Ferrous Sulfate (SLOW FE PO) Take 1 tablet by mouth daily.   Yes [provider]  glimepiride (AMARYL) 1 MG tablet Take 0.5 mg by mouth 2 (two) times daily. 02/15/19  Yes [provider]  indapamide (LOZOL) 1.25 MG tablet Take 1.25 mg by mouth daily. 03/10/23  Yes [provider]  montelukast (SINGULAIR) 10 MG tablet TAKE 1 TABLET BY MOUTH EVERY DAY Patient taking differently: Take 10 mg by mouth at bedtime. 07/19/19  Yes Kozlow, Alvira Philips, MD  nystatin cream (MYCOSTATIN) Apply 1 Application topically as needed for dry skin (itching, burning, redness). 05/02/23  Yes [provider]  omeprazole (PRILOSEC) 40 MG capsule TAKE 1 CAPSULE BY MOUTH EVERY DAY IN THE MORNING BEFORE BREAKFAST Patient taking differently: Take 40 mg by mouth daily. 07/30/21  Yes Kozlow, Alvira Philips, MD  Semaglutide, 1 MG/DOSE, 2 MG/1.5ML SOPN Inject 2 mg into the skin once a week. Wednesday's 05/25/20  Yes [provider]     Vital Signs: BP 129/74 (BP Location: Right Arm)   Pulse 84   Temp 99 F (37.2 C) (Oral)   Resp 20   Ht 5\' 2"  (1.575 m)   Wt 200 lb (90.7 kg)   SpO2 94%   BMI 36.58 kg/m   Physical Exam Vitals and nursing note reviewed.  Constitutional:      General: She is not in acute distress. HENT:     Head: Normocephalic.  Cardiovascular:     Rate and Rhythm: Normal rate.  Pulmonary:     Effort: Pulmonary effort is normal.     Comments: (+) left anterior chest tube to LWS, ~450 cc hazy serous output in pleurvac. No air leak noted. Insertion site clean, dry, dressed appropriately. No SQ emphysema. Skin:    General: Skin is warm and dry.  Neurological:     Mental Status: She is alert. Mental status is at baseline.     Imaging: CT GUIDED SOFT TISSUE FLUID DRAIN BY PERC CATH Result Date: 07/05/2023 INDICATION: 68 year old female with complicated left pneumonia and parapneumonic effusion. EXAM: CT-guided placement of chest tube TECHNIQUE:  Multidetector CT imaging of the chest was performed following the standard protocol without IV contrast. RADIATION DOSE REDUCTION: This exam was performed according to the departmental dose-optimization program which includes automated exposure control, adjustment of the mA and/or kV according to patient size and/or use of iterative reconstruction technique. MEDICATIONS: None ANESTHESIA/SEDATION: None.  Patient was given Versed IV for anxiolysis. COMPLICATIONS: None immediate. PROCEDURE: Informed written consent was obtained from the patient after a thorough discussion of the procedural risks, benefits and alternatives. All questions were addressed. Maximal Sterile Barrier Technique was utilized including caps, mask, sterile gowns, sterile gloves, sterile drape, hand hygiene and skin antiseptic. A timeout was performed prior to the initiation of the procedure. A CT scan of the chest was performed. The loculated fluid was identified. A suitable skin entry site was selected and marked. The skin was sterilely prepped and draped in standard fashion using chlorhexidine skin prep. Local anesthesia was attained by infiltration with 1% lidocaine. A small dermatotomy was made. Under intermittent CT guidance, an 18 gauge trocar needle was carefully advanced over the rib and into the fluid from an anterior approach. A 0.035 wire was then coiled in the fluid collection. The tract was dilated to 60 [place]. A 12 [place] skater drain was advanced over the wire and formed in the fluid. There was return of yellow pleural fluid. The catheter was secured in place with an 0 Prolene suture and connected to low wall suction via a pleur-evac device. An air tight bandage was applied. IMPRESSION: Successful placement of a 79 French thoracostomy tube using CT guidance via a left anterior approach. Electronically Signed   By: Malachy Moan M.D.   On: 07/05/2023 06:55   CT CHEST WO CONTRAST Result Date: 07/04/2023 CLINICAL DATA:   Empyema. Recently diagnosed with left lower lobe pneumonia with worsening chest pain and enlarging pleural effusion. Thoracentesis performed yesterday. History of diabetes and hypertension. EXAM: CT CHEST WITHOUT CONTRAST TECHNIQUE: Multidetector CT imaging of the chest was performed following the standard protocol without IV contrast. RADIATION DOSE REDUCTION: This exam was performed according to the departmental dose-optimization program which includes automated exposure control, adjustment of the mA and/or kV according to patient size and/or use of iterative reconstruction technique. COMPARISON:  Radiographs 07/03/2023 and 06/30/2023. Chest CTA 06/30/2023. FINDINGS: Cardiovascular: No significant vascular findings on noncontrast imaging. Mild aortic and coronary artery atherosclerosis noted. The heart size is normal. There is no pericardial effusion. Mediastinum/Nodes: Stable mildly prominent subcarinal and hilar lymph nodes bilaterally, likely reactive. Small hiatal hernia.The thyroid gland and trachea appear unremarkable. Lungs/Pleura: Moderate to large left pleural effusion has significantly enlarged from the previous CT of 4 days ago and has loculated components laterally measuring approximately 13.8 x 5.5 x 11.5 cm, consistent with reported empyema. There is resulting increased compressive atelectasis of the left lower lobe and lingula. Underlying left lower lobe consolidation. No evidence of pneumothorax. Trace dependent right  pleural effusion. The right lung is clear. Upper abdomen: No acute findings are seen in the visualized upper abdomen. Hepatic steatosis noted. Possible punctate nonobstructing bilateral renal calculi. Musculoskeletal/Chest wall: There is no chest wall mass or suspicious osseous finding. IMPRESSION: 1. Significant enlargement of left pleural effusion with loculated components laterally, consistent with reported empyema. This effusion is suboptimally characterized by this noncontrast  study. Consider chest tube placement. 2. Resulting increased compressive atelectasis of the left lower lobe and lingula. Underlying left lower lobe consolidation consistent with pneumonia. 3. Trace dependent right pleural effusion.  Clear right lung. 4. Stable mildly prominent mediastinal and hilar lymph nodes, likely reactive. 5. Hepatic steatosis. Possible punctate nonobstructing bilateral renal calculi. 6.  Aortic Atherosclerosis (ICD10-I70.0). Electronically Signed   By: Carey Bullocks M.D.   On: 07/04/2023 14:58   DG Chest 1 View Result Date: 07/03/2023 CLINICAL DATA:  Status post left thoracentesis. EXAM: CHEST  1 VIEW COMPARISON:  07/03/2023 at 0903 hours FINDINGS: Aeration in the left lung has mildly improved. There continues to be a large amount of consolidation or pleural fluid in the mid and lower left hemithorax. Negative for a pneumothorax. Right lung is clear. Cardiac silhouette is obscured by the densities in left lower chest. IMPRESSION: 1. Mild improvement in aeration in the left lung. Negative for a pneumothorax. 2. Large amount of consolidation or loculated pleural fluid in the left chest. Electronically Signed   By: Richarda Overlie M.D.   On: 07/03/2023 15:11   US THORACENTESIS ASP PLEURAL SPACE W/IMG GUIDE Result Date: 07/03/2023 INDICATION: Dyspnea.  Pneumonia. EXAM: ULTRASOUND GUIDED LEFT THORACENTESIS MEDICATIONS: None. COMPLICATIONS: None immediate. PROCEDURE: An ultrasound guided thoracentesis was thoroughly discussed with the patient and questions answered. The benefits, risks, alternatives and complications were also discussed. The patient understands and wishes to proceed with the procedure. Written consent was obtained. Ultrasound was performed to localize and mark an adequate pocket of fluid in the left posterior chest. The area was then prepped and draped in the normal sterile fashion. 1% Lidocaine was used for local anesthesia. Under ultrasound guidance a 6 Fr Safe-T-Centesis  catheter was introduced. Thoracentesis was performed. The catheter was removed and a dressing applied. FINDINGS: A total of approximately 320 mL of clear yellow fluid was removed. Samples were sent to the laboratory as requested by the clinical team. Pleural catheter stopped draining despite ultrasound showing residual pleural fluid. Findings compatible with loculated pleural fluid. IMPRESSION: Successful ultrasound guided left thoracentesis yielding 320 mL of pleural fluid. Electronically Signed   By: Richarda Overlie M.D.   On: 07/03/2023 15:08   DG Chest Port 1 View Result Date: 07/03/2023 CLINICAL DATA:  Dyspnea. EXAM: PORTABLE CHEST 1 VIEW COMPARISON:  Chest two views 06/30/2023 and 06/28/2023; CT abdomen and pelvis 06/30/2023 FINDINGS: Interval worsening of homogeneous opacification of the inferior 80% of the left hemithorax, likely a combination of pleural fluid and airspace opacification/consolidation. The right lung remains clear. The visualized cardiac silhouette and mediastinal contours are within limits. Mild-to-moderate atherosclerotic calcifications within the aortic arch. No pleural effusion. No acute skeletal abnormality. IMPRESSION: Interval worsening of homogeneous opacification of the inferior 80% of the left hemithorax, likely a combination of pleural fluid and airspace opacification/consolidation. Electronically Signed   By: Neita Garnet M.D.   On: 07/03/2023 12:57    Labs:  CBC: Recent Labs    07/02/23 0556 07/03/23 0540 07/04/23 0510 07/05/23 0836  WBC 13.1* 15.6* 14.6* 14.0*  HGB 11.8* 12.5 11.9* 12.2  HCT 36.0 37.7 35.6* 38.2  PLT 321 335 337 393    COAGS: Recent Labs    07/04/23 1342  INR 1.9*    BMP: Recent Labs    07/02/23 0556 07/03/23 0540 07/04/23 0510 07/05/23 0836  NA 132* 133* 133* 135  K 3.6 3.4* 3.7 4.0  CL 97* 97* 99 100  CO2 20* 23 22 22   GLUCOSE 155* 186* 216* 155*  BUN 16 13 13 14   CALCIUM 8.6* 8.8* 8.7* 8.8*  CREATININE 0.67 0.64 0.59 0.62   GFRNONAA >60 >60 >60 >60    LIVER FUNCTION TESTS: Recent Labs    06/28/23 2308  BILITOT 0.9  AST 31  ALT 34  ALKPHOS 41  PROT 8.1  ALBUMIN 4.3    Assessment and Plan:  68 y/o F s/p left chest tube placement 3/28 in IR (Dr. Archer Asa) due to concern for empyema seen today for follow up.  Patient reports feeling well, breathing has improved. No concerns regarding chest tube overnight. Chest tube with ~450 cc hazy serous output since placement. No air leak on exam today.  Plan: - Continue chest tube to LWS - Routine wound care of insertion site - Plans for lytics/duration of chest tube per discretion of PCCM  IR remains available as needed, further plans per primary team/pulmonology.  Electronically Signed: Villa Herb, PA-C 07/05/2023, 10:47 AM   I spent a total of 15 Minutes at the the patient's bedside AND on the patient's hospital floor or unit, greater than 50% of which was counseling/coordinating care for left empyema/chest tube.

## 2023-07-05 NOTE — Plan of Care (Signed)
  Problem: Education: Goal: Knowledge of General Education information will improve Description: Including pain rating scale, medication(s)/side effects and non-pharmacologic comfort measures Outcome: Progressing   Problem: Clinical Measurements: Goal: Will remain free from infection Outcome: Progressing Goal: Diagnostic test results will improve Outcome: Progressing Goal: Respiratory complications will improve Outcome: Progressing   Problem: Pain Managment: Goal: General experience of comfort will improve and/or be controlled Outcome: Progressing   Problem: Activity: Goal: Ability to tolerate increased activity will improve Outcome: Progressing   Problem: Clinical Measurements: Goal: Ability to maintain a body temperature in the normal range will improve Outcome: Progressing   Problem: Respiratory: Goal: Ability to maintain adequate ventilation will improve Outcome: Progressing   Problem: Education: Goal: Ability to describe self-care measures that may prevent or decrease complications (Diabetes Survival Skills Education) will improve Outcome: Progressing

## 2023-07-05 NOTE — Progress Notes (Signed)
   07/05/23 2003  BiPAP/CPAP/SIPAP  Reason BIPAP/CPAP not in use Non-compliant (Pt stated she is not consistent at home with her cpap last worn was in october. Machine remained bedside if pt chages her mind. told pt to let RN know if she want to wear it and RT will assist.)  BiPAP/CPAP /SiPAP Vitals  SpO2 95 %  Bilateral Breath Sounds Clear;Diminished

## 2023-07-05 NOTE — Progress Notes (Signed)
 PROGRESS NOTE    Jade Perez  EAV:[id number] DOB: 10-Apr-1955 DOA: 06/30/2023 PCP: Abner Greenspan, MD    Brief Narrative:  68 year old with history of HTN, HLD, DM2, OSA, depression, asthma, NSTEMI with normal coronaries admitted for shortness of breath and cough.  Outpatient diagnosed of left lower lobe pneumonia on 3/22 but returns due to worsening of her pain and discomfort.  CTA chest negative for PE but does show left lower lobe consolidation started on Rocephin and azithromycin.  Unfortunately chest x-ray showed worsening of left-sided pleural effusion, thoracentesis on 3/27 yielded 220 cc with due to persistence of this effusion, CT was performed which showed concerns of loculation therefore chest tube placed 3/28.  Assessment & Plan:  Principal Problem:   CAP (community acquired pneumonia) Active Problems:   Intrinsic asthma   Type 2 diabetes mellitus without complication, without long-term current use of insulin (HCC)   Acute respiratory failure with hypoxia (HCC)   Hypokalemia   Hypertension   Depression   OSA (obstructive sleep apnea)   Hyperlipidemia, mixed    Left lower lobe community-acquired pneumonia Left-sided pleural effusion, worsening Failed outpatient therapy.  Leukocytosis slightly up today, CTA chest negative for PE but showed left-sided lower lobe consolidation therefore on IV antibiotics.  X-ray showed worsening left-sided pleural effusion, thoracentesis on 3/27 yielded 320 cc. Micro is neg. repeat CT showed persistence of this effusion, concerns of loculation therefore chest tube placed 3/28. -Chest x-ray this morning shows improved aeration, depending on the output may require tPA -Ambulatory pulse ox   Hypokalemia/hypophosphatemia Replete as needed   Essential hypertension Norvasc.  IV as needed   Hyperlipidemia Statin   Diabetes mellitus type 2 - A1c was 6.5% in September 2023  -Holding home Ozempic.  Continue Amaryl Sliding scale and  Accu-Cheks   History of asthma and OSA Bronchodilators   Depression - Celexa    Smudge cells - Smudge cells noted on peripheral smear, percentage not specified  - Could be d/t the infection but she will need further workup with flow cytometry if leukocytosis persists   PT/OT-no follow-up needed Ambulatory pulse ox  DVT prophylaxis: Lovenox    Code Status: Full Code Family Communication: Husband at bedside Status is: Inpatient Remains inpatient appropriate because: Continue hospital side for abnormal breath sounds.  Chest tube management by pulmonary   Subjective: Tolerated chest tube placement on 3/28 Tells me her breathing is improved  Examination:  General exam: Appears calm and comfortable  Respiratory system: Left side diminished breath sounds, better air movement Cardiovascular system: S1 & S2 heard, RRR. No JVD, murmurs, rubs, gallops or clicks. No pedal edema. Gastrointestinal system: Abdomen is nondistended, soft and nontender. No organomegaly or masses felt. Normal bowel sounds heard. Central nervous system: Alert and oriented. No focal neurological deficits. Extremities: Symmetric 5 x 5 power. Skin: No rashes, lesions or ulcers Psychiatry: Judgement and insight appear normal. Mood & affect appropriate. Left-sided chest tube               Diet Orders (From admission, onward)     Start     Ordered   07/04/23 1738  Diet regular Room service appropriate? Yes; Fluid consistency: Thin  Diet effective now       Question Answer Comment  Room service appropriate? Yes   Fluid consistency: Thin      07/04/23 1737            Objective: Vitals:   07/04/23 1736 07/04/23 1951 07/04/23 1952 07/05/23 0818  BP:  130/73  129/74   Pulse: 86  84   Resp: (!) 24  20   Temp: 98.9 F (37.2 C)  99 F (37.2 C)   TempSrc: Oral  Oral   SpO2: 97% 95% 95% 94%  Weight:      Height:        Intake/Output Summary (Last 24 hours) at 07/05/2023 1031 Last data filed  at 07/05/2023 0700 Gross per 24 hour  Intake 100 ml  Output 1025 ml  Net -925 ml   Filed Weights   06/30/23 0046  Weight: 90.7 kg    Scheduled Meds:  amLODipine  5 mg Oral Daily   arformoterol  15 mcg Nebulization BID   atorvastatin  40 mg Oral QHS   citalopram  20 mg Oral Daily   enoxaparin (LOVENOX) injection  40 mg Subcutaneous Q24H   fenofibrate  160 mg Oral Daily   ferrous sulfate  325 mg Oral Q breakfast   glimepiride  0.5 mg Oral BID   guaiFENesin  10 mL Oral Q6H   insulin aspart  0-5 Units Subcutaneous QHS   insulin aspart  0-6 Units Subcutaneous TID WC   insulin glargine-yfgn  10 Units Subcutaneous Daily   lidocaine  2 patch Transdermal Q24H   montelukast  10 mg Oral QHS   pantoprazole  40 mg Oral Daily   revefenacin  175 mcg Nebulization Daily   sodium chloride flush  3 mL Intravenous Q12H   Continuous Infusions:  cefTRIAXone (ROCEPHIN)  IV 1 g (07/05/23 0105)    Nutritional status     Body mass index is 36.58 kg/m.  Data Reviewed:   CBC: Recent Labs  Lab 06/28/23 2308 06/30/23 0103 07/01/23 0550 07/02/23 0556 07/03/23 0540 07/04/23 0510 07/05/23 0836  WBC 19.6* 20.1* 16.4* 13.1* 15.6* 14.6* 14.0*  NEUTROABS 16.3* 16.7*  --   --   --   --   --   HGB 14.6 13.0 11.6* 11.8* 12.5 11.9* 12.2  HCT 43.2 37.6 36.9 36.0 37.7 35.6* 38.2  MCV 88.7 89.5 96.1 93.8 90.8 91.0 92.7  PLT 309 266 260 321 335 337 393   Basic Metabolic Panel: Recent Labs  Lab 06/30/23 0804 07/01/23 0550 07/02/23 0556 07/03/23 0540 07/04/23 0510 07/05/23 0836  NA 134* 131* 132* 133* 133* 135  K 3.3* 3.4* 3.6 3.4* 3.7 4.0  CL 101 97* 97* 97* 99 100  CO2 20* 22 20* 23 22 22   GLUCOSE 159* 126* 155* 186* 216* 155*  BUN 17 18 16 13 13 14   CREATININE 0.72 0.78 0.67 0.64 0.59 0.62  CALCIUM 9.2 8.7* 8.6* 8.8* 8.7* 8.8*  MG 1.6* 2.2 2.1 1.7 1.7 1.7  PHOS 1.9* 1.9*  --   --   --   --    GFR: Estimated Creatinine Clearance: 69.5 mL/min (by C-G formula based on SCr of 0.62  mg/dL). Liver Function Tests: Recent Labs  Lab 06/28/23 2308  AST 31  ALT 34  ALKPHOS 41  BILITOT 0.9  PROT 8.1  ALBUMIN 4.3   No results for input(s): "LIPASE", "AMYLASE" in the last 168 hours. No results for input(s): "AMMONIA" in the last 168 hours. Coagulation Profile: Recent Labs  Lab 07/04/23 1342  INR 1.9*   Cardiac Enzymes: No results for input(s): "CKTOTAL", "CKMB", "CKMBINDEX", "TROPONINI" in the last 168 hours. BNP (last 3 results) No results for input(s): "PROBNP" in the last 8760 hours. HbA1C: No results for input(s): "HGBA1C" in the last 72 hours. CBG: Recent Labs  Lab  07/04/23 0731 07/04/23 1204 07/04/23 1736 07/04/23 2050 07/05/23 0817  GLUCAP 193* 201* 141* 128* 153*   Lipid Profile: No results for input(s): "CHOL", "HDL", "LDLCALC", "TRIG", "CHOLHDL", "LDLDIRECT" in the last 72 hours. Thyroid Function Tests: No results for input(s): "TSH", "T4TOTAL", "FREET4", "T3FREE", "THYROIDAB" in the last 72 hours. Anemia Panel: No results for input(s): "VITAMINB12", "FOLATE", "FERRITIN", "TIBC", "IRON", "RETICCTPCT" in the last 72 hours. Sepsis Labs: Recent Labs  Lab 07/05/23 0836  PROCALCITON 0.78    Recent Results (from the past 240 hours)  Resp panel by RT-PCR (RSV, Flu A&B, Covid) Anterior Nasal Swab     Status: None   Collection Time: 06/28/23  7:16 PM   Specimen: Anterior Nasal Swab  Result Value Ref Range Status   SARS Coronavirus 2 by RT PCR NEGATIVE NEGATIVE Final    Comment: (NOTE) SARS-CoV-2 target nucleic acids are NOT DETECTED.  The SARS-CoV-2 RNA is generally detectable in upper respiratory specimens during the acute phase of infection. The lowest concentration of SARS-CoV-2 viral copies this assay can detect is 138 copies/mL. A negative result does not preclude SARS-Cov-2 infection and should not be used as the sole basis for treatment or other patient management decisions. A negative result may occur with  improper specimen  collection/handling, submission of specimen other than nasopharyngeal swab, presence of viral mutation(s) within the areas targeted by this assay, and inadequate number of viral copies(<138 copies/mL). A negative result must be combined with clinical observations, patient history, and epidemiological information. The expected result is Negative.  Fact Sheet for Patients:  BloggerCourse.com  Fact Sheet for Healthcare Providers:  SeriousBroker.it  This test is no t yet approved or cleared by the [place] FDA and  has been authorized for detection and/or diagnosis of SARS-CoV-2 by FDA under an Emergency Use Authorization (EUA). This EUA will remain  in effect (meaning this test can be used) for the duration of the COVID-19 declaration under Section 564(b)(1) of the Act, 21 U.S.C.section 360bbb-3(b)(1), unless the authorization is terminated  or revoked sooner.       Influenza A by PCR NEGATIVE NEGATIVE Final   Influenza B by PCR NEGATIVE NEGATIVE Final    Comment: (NOTE) The Xpert Xpress SARS-CoV-2/FLU/RSV plus assay is intended as an aid in the diagnosis of influenza from Nasopharyngeal swab specimens and should not be used as a sole basis for treatment. Nasal washings and aspirates are unacceptable for Xpert Xpress SARS-CoV-2/FLU/RSV testing.  Fact Sheet for Patients: BloggerCourse.com  Fact Sheet for Healthcare Providers: SeriousBroker.it  This test is not yet approved or cleared by the [place] FDA and has been authorized for detection and/or diagnosis of SARS-CoV-2 by FDA under an Emergency Use Authorization (EUA). This EUA will remain in effect (meaning this test can be used) for the duration of the COVID-19 declaration under Section 564(b)(1) of the Act, 21 U.S.C. section 360bbb-3(b)(1), unless the authorization is terminated or revoked.     Resp Syncytial  Virus by PCR NEGATIVE NEGATIVE Final    Comment: (NOTE) Fact Sheet for Patients: BloggerCourse.com  Fact Sheet for Healthcare Providers: SeriousBroker.it  This test is not yet approved or cleared by the [place] FDA and has been authorized for detection and/or diagnosis of SARS-CoV-2 by FDA under an Emergency Use Authorization (EUA). This EUA will remain in effect (meaning this test can be used) for the duration of the COVID-19 declaration under Section 564(b)(1) of the Act, 21 U.S.C. section 360bbb-3(b)(1), unless the authorization is terminated or revoked.  Performed at  [facility], [address] [place], [place] [address]   Blood culture (routine x 2)     Status: None   Collection Time: 06/30/23  1:03 AM   Specimen: BLOOD  Result Value Ref Range Status   Specimen Description   Final    BLOOD LEFT ANTECUBITAL Performed at [facility], [address] [place], [place] [address]    Special Requests   Final    BOTTLES DRAWN AEROBIC AND ANAEROBIC Blood Culture adequate volume Performed at [facility], [address] [place], [place] [address]    Culture   Final    NO GROWTH 5 DAYS Performed at [facility], [address] [place], [place] [address]    Report Status 07/05/2023 FINAL  Final  Blood culture (routine x 2)     Status: None   Collection Time: 06/30/23  1:03 AM   Specimen: BLOOD RIGHT FOREARM  Result Value Ref Range Status   Specimen Description   Final    BLOOD RIGHT FOREARM Performed at [facility], [address] [place], [place] [address]    Special Requests   Final    BOTTLES DRAWN AEROBIC AND ANAEROBIC Blood Culture adequate volume Performed at [facility], [address] [place], [place] [address]    Culture   Final    NO GROWTH 5 DAYS Performed at [facility], [address] [place], [place] [address]    Report Status  07/05/2023 FINAL  Final  Expectorated Sputum Assessment w Gram Stain, Rflx to Resp Cult     Status: None   Collection Time: 07/02/23  4:43 PM   Specimen: Expectorated Sputum  Result Value Ref Range Status   Specimen Description EXPECTORATED SPUTUM  Final   Special Requests NONE  Final   Sputum evaluation   Final    THIS SPECIMEN IS ACCEPTABLE FOR SPUTUM CULTURE Performed at [facility], [address] [place], [place] [address]    Report Status 07/02/2023 FINAL  Final  Culture, Respiratory w Gram Stain     Status: None (Preliminary result)   Collection Time: 07/02/23  4:43 PM  Result Value Ref Range Status   Specimen Description   Final    EXPECTORATED SPUTUM Performed at [facility], [address] [place], [place] [address]    Special Requests   Final    NONE Reflexed from [id number] Performed at [facility], [address], [place], [place] [address]    Gram Stain   Final    FEW WBC PRESENT, PREDOMINANTLY PMN FEW GRAM POSITIVE COCCI RARE YEAST WITH PSEUDOHYPHAE RARE GRAM NEGATIVE RODS    Culture   Final    RARE STAPHYLOCOCCUS AUREUS CULTURE REINCUBATED FOR BETTER GROWTH Performed at [facility], [address] [place], [place] [address]    Report Status PENDING  Incomplete  Culture, Fungus without Smear     Status: None (Preliminary result)   Collection Time: 07/03/23  2:24 PM   Specimen: PATH Cytology Pleural fluid  Result Value Ref Range Status   Specimen Description   Final    PLEURAL Performed at [facility], [address], [place], [place] [address]    Special Requests   Final    NONE Performed at [facility], [address] [place], [place] [address]    Culture   Final  NO FUNGUS ISOLATED AFTER 2 DAYS Performed at [facility], [address] [place], [place] [address]    Report Status PENDING  Incomplete  Body fluid culture w Gram Stain      Status: None (Preliminary result)   Collection Time: 07/03/23  2:24 PM   Specimen: PATH Cytology Pleural fluid  Result Value Ref Range Status   Specimen Description   Final    PLEURAL Performed at [facility], [address], [place], [place] [address]    Special Requests   Final    NONE Performed at [facility], [address], [place], [place] [address]    Gram Stain NO WBC SEEN NO ORGANISMS SEEN   Final   Culture   Final    NO GROWTH 2 DAYS Performed at [facility], [address], [place], [place] [address]    Report Status PENDING  Incomplete  Respiratory (~20 pathogens) panel by PCR     Status: None   Collection Time: 07/04/23 10:04 AM   Specimen: Nasopharyngeal Swab; Respiratory  Result Value Ref Range Status   Adenovirus NOT DETECTED NOT DETECTED Final   Coronavirus 229E NOT DETECTED NOT DETECTED Final    Comment: (NOTE) The Coronavirus on the Respiratory Panel, DOES NOT test for the novel  Coronavirus (2019 nCoV)    Coronavirus HKU1 NOT DETECTED NOT DETECTED Final   Coronavirus NL63 NOT DETECTED NOT DETECTED Final   Coronavirus OC43 NOT DETECTED NOT DETECTED Final   Metapneumovirus NOT DETECTED NOT DETECTED Final   Rhinovirus / Enterovirus NOT DETECTED NOT DETECTED Final   Influenza A NOT DETECTED NOT DETECTED Final   Influenza B NOT DETECTED NOT DETECTED Final   Parainfluenza Virus 1 NOT DETECTED NOT DETECTED Final   Parainfluenza Virus 2 NOT DETECTED NOT DETECTED Final   Parainfluenza Virus 3 NOT DETECTED NOT DETECTED Final   Parainfluenza Virus 4 NOT DETECTED NOT DETECTED Final   Respiratory Syncytial Virus NOT DETECTED NOT DETECTED Final   Bordetella pertussis NOT DETECTED NOT DETECTED Final   Bordetella Parapertussis NOT DETECTED NOT DETECTED Final   Chlamydophila pneumoniae NOT DETECTED NOT DETECTED Final   Mycoplasma pneumoniae NOT DETECTED NOT DETECTED Final    Comment: Performed at [facility],  [address] [place], [place] [address]         Radiology Studies: CT GUIDED SOFT TISSUE FLUID DRAIN BY PERC CATH Result Date: 07/05/2023 INDICATION: 68 year old female with complicated left pneumonia and parapneumonic effusion. EXAM: CT-guided placement of chest tube TECHNIQUE: Multidetector CT imaging of the chest was performed following the standard protocol without IV contrast. RADIATION DOSE REDUCTION: This exam was performed according to the departmental dose-optimization program which includes automated exposure control, adjustment of the mA and/or kV according to patient size and/or use of iterative reconstruction technique. MEDICATIONS: None ANESTHESIA/SEDATION: None.  Patient was given Versed IV for anxiolysis. COMPLICATIONS: None immediate. PROCEDURE: Informed written consent was obtained from the patient after a thorough discussion of the procedural risks, benefits and alternatives. All questions were addressed. Maximal Sterile Barrier Technique was utilized including caps, mask, sterile gowns, sterile gloves, sterile drape, hand hygiene and skin antiseptic. A timeout was performed prior to the initiation of the procedure. A CT scan of the chest was performed. The loculated fluid was identified. A suitable skin entry site was selected and marked. The skin was sterilely prepped and draped in standard fashion using chlorhexidine skin prep. Local anesthesia was attained by infiltration with  1% lidocaine. A small dermatotomy was made. Under intermittent CT guidance, an 18 gauge trocar needle was carefully advanced over the rib and into the fluid from an anterior approach. A 0.035 wire was then coiled in the fluid collection. The tract was dilated to 35 [place]. A 12 [place] skater drain was advanced over the wire and formed in the fluid. There was return of yellow pleural fluid. The catheter was secured in place with an 0 Prolene suture and connected to low wall suction via a pleur-evac device. An air  tight bandage was applied. IMPRESSION: Successful placement of a 10 French thoracostomy tube using CT guidance via a left anterior approach. Electronically Signed   By: Malachy Moan M.D.   On: 07/05/2023 06:55   CT CHEST WO CONTRAST Result Date: 07/04/2023 CLINICAL DATA:  Empyema. Recently diagnosed with left lower lobe pneumonia with worsening chest pain and enlarging pleural effusion. Thoracentesis performed yesterday. History of diabetes and hypertension. EXAM: CT CHEST WITHOUT CONTRAST TECHNIQUE: Multidetector CT imaging of the chest was performed following the standard protocol without IV contrast. RADIATION DOSE REDUCTION: This exam was performed according to the departmental dose-optimization program which includes automated exposure control, adjustment of the mA and/or kV according to patient size and/or use of iterative reconstruction technique. COMPARISON:  Radiographs 07/03/2023 and 06/30/2023. Chest CTA 06/30/2023. FINDINGS: Cardiovascular: No significant vascular findings on noncontrast imaging. Mild aortic and coronary artery atherosclerosis noted. The heart size is normal. There is no pericardial effusion. Mediastinum/Nodes: Stable mildly prominent subcarinal and hilar lymph nodes bilaterally, likely reactive. Small hiatal hernia.The thyroid gland and trachea appear unremarkable. Lungs/Pleura: Moderate to large left pleural effusion has significantly enlarged from the previous CT of 4 days ago and has loculated components laterally measuring approximately 13.8 x 5.5 x 11.5 cm, consistent with reported empyema. There is resulting increased compressive atelectasis of the left lower lobe and lingula. Underlying left lower lobe consolidation. No evidence of pneumothorax. Trace dependent right pleural effusion. The right lung is clear. Upper abdomen: No acute findings are seen in the visualized upper abdomen. Hepatic steatosis noted. Possible punctate nonobstructing bilateral renal calculi.  Musculoskeletal/Chest wall: There is no chest wall mass or suspicious osseous finding. IMPRESSION: 1. Significant enlargement of left pleural effusion with loculated components laterally, consistent with reported empyema. This effusion is suboptimally characterized by this noncontrast study. Consider chest tube placement. 2. Resulting increased compressive atelectasis of the left lower lobe and lingula. Underlying left lower lobe consolidation consistent with pneumonia. 3. Trace dependent right pleural effusion.  Clear right lung. 4. Stable mildly prominent mediastinal and hilar lymph nodes, likely reactive. 5. Hepatic steatosis. Possible punctate nonobstructing bilateral renal calculi. 6.  Aortic Atherosclerosis (ICD10-I70.0). Electronically Signed   By: Carey Bullocks M.D.   On: 07/04/2023 14:58   DG Chest 1 View Result Date: 07/03/2023 CLINICAL DATA:  Status post left thoracentesis. EXAM: CHEST  1 VIEW COMPARISON:  07/03/2023 at 0903 hours FINDINGS: Aeration in the left lung has mildly improved. There continues to be a large amount of consolidation or pleural fluid in the mid and lower left hemithorax. Negative for a pneumothorax. Right lung is clear. Cardiac silhouette is obscured by the densities in left lower chest. IMPRESSION: 1. Mild improvement in aeration in the left lung. Negative for a pneumothorax. 2. Large amount of consolidation or loculated pleural fluid in the left chest. Electronically Signed   By: Richarda Overlie M.D.   On: 07/03/2023 15:11   US THORACENTESIS ASP PLEURAL SPACE W/IMG GUIDE Result Date:  07/03/2023 INDICATION: Dyspnea.  Pneumonia. EXAM: ULTRASOUND GUIDED LEFT THORACENTESIS MEDICATIONS: None. COMPLICATIONS: None immediate. PROCEDURE: An ultrasound guided thoracentesis was thoroughly discussed with the patient and questions answered. The benefits, risks, alternatives and complications were also discussed. The patient understands and wishes to proceed with the procedure. Written  consent was obtained. Ultrasound was performed to localize and mark an adequate pocket of fluid in the left posterior chest. The area was then prepped and draped in the normal sterile fashion. 1% Lidocaine was used for local anesthesia. Under ultrasound guidance a 6 Fr Safe-T-Centesis catheter was introduced. Thoracentesis was performed. The catheter was removed and a dressing applied. FINDINGS: A total of approximately 320 mL of clear yellow fluid was removed. Samples were sent to the laboratory as requested by the clinical team. Pleural catheter stopped draining despite ultrasound showing residual pleural fluid. Findings compatible with loculated pleural fluid. IMPRESSION: Successful ultrasound guided left thoracentesis yielding 320 mL of pleural fluid. Electronically Signed   By: Richarda Overlie M.D.   On: 07/03/2023 15:08           LOS: 5 days   Time spent= 35 mins    Miguel Rota, MD [facility]  If 7PM-7AM, please contact night-coverage  07/05/2023, 10:31 AM

## 2023-07-05 NOTE — Progress Notes (Signed)
 NAME:  Miamor Ayler, MRN:  [id number], DOB:  Nov 22, 1955, LOS: 5 ADMISSION DATE:  06/30/2023, CONSULTATION DATE:  07/04/2023 REFERRING MD:  DR Nelson Chimes, CHIEF COMPLAINT:  LLL white out    History of Present Illness:   68 year old female with past medical history of non-smoking, hypertension, hyperlipidemia, type 2 diabetes, OSA, depression, asthma not otherwise specified presented on 06/30/2023 with cough worsening dyspnea and pleuritic chest pain on the left lower lobe. PRior to that had sick resp contact with grandchild who live with her and t hen she got sick  CT chest showed left lower lobe consolidation with a small pleural effusion [personal visualization of the CCM MD on this consult] she was hypoxemic requiring oxygen.  She had leukocytosis with white count 20,100.  Pulmonary embolism was ruled out started on Treatment.  Subsequently urine strep and Legionella negative.  Respiratory virus panel for COVID, flu and RSV negative [20 panel not done].  Since admission has been afebrile.  Has improving leukocytosis from 20,002 currently 14,600.  Cultures remain negative.  On 07/03/2023 underwent left-sided thoracentesis removing 350 cc by interventional radiology.  This shows a simple exudate with LDH 633 and acute neutrophilic leukocytosis with polymorphs of 86%.  No eosinophils.  Despite all these interventions has remained on 4 L nasal cannula.  She has been covered by IV antibiotics with ceftriaxone and azithromycin since admission and due to and on 07/04/2023 [facility] have PCCM consult].  Of note prior to admission she was treated as outpatient with antibiotics and then failed that.   Pulmonary is now been consulted with a question of residual loculated effusion  Past Medical History:    has a past medical history of Allergic rhinitis, Asthma, mild intermittent, Complication of anesthesia, Depression, GAD (generalized anxiety disorder), GERD (gastroesophageal reflux disease), Hiatal hernia,  History of cardiomyopathy in adulthood (12/06/2021), History of heart murmur in childhood, History of kidney stones, History of non-ST elevation myocardial infarction (NSTEMI) (12/06/2021), Hyperlipidemia, mixed, Hypertension, IDA (iron deficiency anemia), OA (osteoarthritis), OSA (obstructive sleep apnea), Renal disorder, Type 2 diabetes mellitus (HCC) (2008), and Wears glasses.   reports that she has never smoked. She has never used smokeless tobacco.  Past Surgical History:  Procedure Laterality Date   BREAST EXCISIONAL BIOPSY Right    x2  last one 1980s , benign   CARPAL TUNNEL RELEASE Right 05/22/2017   Procedure: RIGHT CARPAL TUNNEL RELEASE;  Surgeon: Cindee Salt, MD;  Location: [facility];  Service: Orthopedics;  Laterality: Right;   CARPAL TUNNEL RELEASE Left 03/12/2018   Procedure: LEFT CARPAL TUNNEL RELEASE;  Surgeon: Cindee Salt, MD;  Location: [facility];  Service: Orthopedics;  Laterality: Left;   CYSTOSCOPY W/ URETERAL STENT PLACEMENT Bilateral 12/11/2021   Procedure: CYSTOSCOPY , BILATERAL RETROGRADE , BILATERAL STENT PLACEMENT;  Surgeon: Sebastian Ache, MD;  Location[facility];  Service: Urology;  Laterality: Bilateral;   CYSTOSCOPY WITH RETROGRADE PYELOGRAM, URETEROSCOPY AND STENT PLACEMENT Bilateral 01/02/2022   Procedure: CYSTOSCOPY WITH RETROGRADE PYELOGRAM, URETEROSCOPY AND STENT REPLACEMENT;  Surgeon: Sebastian Ache, MD;  Location: [facility];  Service: Urology;  Laterality: Bilateral;   DIAGNOSTIC LAPAROSCOPY     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   DILATION AND CURETTAGE OF UTERUS     yrs ago for missed ab   EXTRACORPOREAL SHOCK WAVE LITHOTRIPSY  03/2017   HOLMIUM LASER APPLICATION Bilateral 01/02/2022   Procedure: HOLMIUM LASER APPLICATION;  Surgeon: Sebastian Ache, MD;  Location: [facility];  Service: Urology;  Laterality: Bilateral;   LAPAROSCOPIC APPENDECTOMY  2008   LEFT HEART CATH AND CORONARY ANGIOGRAPHY N/A  12/07/2021   Procedure: LEFT HEART CATH AND CORONARY ANGIOGRAPHY;  Surgeon: Lyn Records, MD;  Location: [facility];  Service: Cardiovascular;  Laterality: N/A;   LIPOMA EXCISION  2003   right shoulder   SCLERAL BUCKLE  10/15/2011   Procedure: SCLERAL BUCKLE;  Surgeon: Sherrie George, MD;  Location: [facility] OR;  Service: Ophthalmology;  Laterality: Right;   SHOULDER ARTHROSCOPY WITH ROTATOR CUFF REPAIR Right 03/25/2019   Procedure: Right shoulder arthroscopy, subacromial decompression, distal clavicle resection, rotator cuff repair;  Surgeon: Francena Hanly, MD;  Location: [facility];  Service: Orthopedics;  Laterality: Right;    WISDOM TOOTH EXTRACTION      Allergies  Allergen Reactions   Amoxicillin Hives    Did it involve swelling of the face/tongue/throat, SOB, or low BP? No Did it involve sudden or severe rash/hives, skin peeling, or any reaction on the inside of your mouth or nose? No Did you need to seek medical attention at a hospital or doctor's office? No When did it last happen?      15-20 years ago If all above answers are "NO", may proceed with cephalosporin use.    Aspirin Swelling   Bee Venom Hives and Other (See Comments)    cellulitis   Clarithromycin Nausea And Vomiting   Erythromycin Nausea Only   Ibuprofen Swelling   Oxycodone Itching   Sulfa Antibiotics Hives   Latex Rash   Tape Rash and Other (See Comments)    Band-Aids, also = blisters     There is no immunization history on file for this patient.  Family History  Problem Relation Age of Onset   Unexplained death Sister 47       died in her sleep   Colon cancer Paternal Grandmother    Colon cancer Paternal Uncle    Allergic rhinitis Neg Hx    Angioedema Neg Hx    Asthma Neg Hx    Atopy Neg Hx    Eczema Neg Hx    Immunodeficiency Neg Hx    Urticaria Neg Hx    Colon polyps Neg Hx    Esophageal cancer Neg Hx    Stomach cancer Neg Hx    Rectal cancer Neg Hx    Breast cancer Neg Hx       Current Facility-Administered Medications:    acetaminophen (TYLENOL) tablet 650 mg, 650 mg, Oral, Q6H PRN, 650 mg at 07/04/23 2106 **OR** acetaminophen (TYLENOL) suppository 650 mg, 650 mg, Rectal, Q6H PRN, Opyd, Lavone Neri, MD   alteplase (CATHFLO ACTIVASE) 10 mg in sodium chloride (PF) 0.9 % 30 mL, 10 mg, Intrapleural, Once **AND** dornase alfa (PULMOZYME) 5 mg in sterile water (preservative free) 30 mL, 5 mg, Intrapleural, Once, Emnet Monk, Lesia Sago, MD   amLODipine (NORVASC) tablet 5 mg, 5 mg, Oral, Daily, Opyd, Lavone Neri, MD, 5 mg at 07/04/23 0927   arformoterol (BROVANA) nebulizer solution 15 mcg, 15 mcg, Nebulization, BID, Amin, Ankit C, MD, 15 mcg at 07/05/23 0816   atorvastatin (LIPITOR) tablet 40 mg, 40 mg, Oral, QHS, Opyd, Lavone Neri, MD, 40 mg at 07/04/23 2106   bisacodyl (DULCOLAX) EC tablet 5 mg, 5 mg, Oral, Daily PRN, Opyd, Lavone Neri, MD   cefTRIAXone (ROCEPHIN) 1 g in sodium chloride 0.9 % 100 mL IVPB, 1 g, Intravenous, Q24H, Amin, Ankit C, MD, Last Rate: 200 mL/hr at 07/05/23 0105, 1 g at 07/05/23  0105   citalopram (CELEXA) tablet 20 mg, 20 mg, Oral, Daily, Opyd, Lavone Neri, MD, 20 mg at 07/04/23 1610   cyclobenzaprine (FLEXERIL) tablet 5 mg, 5 mg, Oral, TID PRN, Amin, Ankit C, MD, 5 mg at 07/05/23 0849   enoxaparin (LOVENOX) injection 40 mg, 40 mg, Subcutaneous, Q24H, Opyd, Lavone Neri, MD, 40 mg at 07/05/23 0845   fenofibrate tablet 160 mg, 160 mg, Oral, Daily, Amin, Ankit C, MD, 160 mg at 07/05/23 0846   fentaNYL (SUBLIMAZE) injection 25 mcg, 25 mcg, Intravenous, Q2H PRN, Amin, Ankit C, MD   ferrous sulfate tablet 325 mg, 325 mg, Oral, Q breakfast, Amin, Ankit C, MD, 325 mg at 07/05/23 0848   glimepiride (AMARYL) tablet 0.5 mg, 0.5 mg, Oral, BID, Amin, Ankit C, MD, 0.5 mg at 07/05/23 0846   guaiFENesin (ROBITUSSIN) 100 MG/5ML liquid 10 mL, 10 mL, Oral, Q6H, Amin, Ankit C, MD   hydrALAZINE (APRESOLINE) injection 10 mg, 10 mg, Intravenous, Q4H PRN, Amin, Ankit C, MD    HYDROcodone-acetaminophen (NORCO/VICODIN) 5-325 MG per tablet 1 tablet, 1 tablet, Oral, Q4H PRN, Opyd, Lavone Neri, MD, 1 tablet at 07/05/23 0846   insulin aspart (novoLOG) injection 0-5 Units, 0-5 Units, Subcutaneous, QHS, Opyd, Lavone Neri, MD, 3 Units at 07/03/23 2103   insulin aspart (novoLOG) injection 0-6 Units, 0-6 Units, Subcutaneous, TID WC, Opyd, Lavone Neri, MD, 1 Units at 07/05/23 0848   insulin glargine-yfgn (SEMGLEE) injection 10 Units, 10 Units, Subcutaneous, Daily, Amin, Ankit C, MD, 10 Units at 07/05/23 0848   ipratropium-albuterol (DUONEB) 0.5-2.5 (3) MG/3ML nebulizer solution 3 mL, 3 mL, Nebulization, Q4H PRN, Amin, Ankit C, MD   lidocaine (LIDODERM) 5 % 2 patch, 2 patch, Transdermal, Q24H, Amin, Ankit C, MD, 2 patch at 07/05/23 0836   metoprolol tartrate (LOPRESSOR) injection 5 mg, 5 mg, Intravenous, Q4H PRN, Amin, Ankit C, MD   montelukast (SINGULAIR) tablet 10 mg, 10 mg, Oral, QHS, Opyd, Lavone Neri, MD, 10 mg at 07/04/23 2106   ondansetron (ZOFRAN) tablet 4 mg, 4 mg, Oral, Q6H PRN **OR** ondansetron (ZOFRAN) injection 4 mg, 4 mg, Intravenous, Q6H PRN, Opyd, Lavone Neri, MD, 4 mg at 07/03/23 1737   pantoprazole (PROTONIX) EC tablet 40 mg, 40 mg, Oral, Daily, Opyd, Lavone Neri, MD, 40 mg at 07/04/23 9604   polyethylene glycol (MIRALAX / GLYCOLAX) packet 17 g, 17 g, Oral, Daily PRN, Opyd, Lavone Neri, MD   revefenacin (YUPELRI) nebulizer solution 175 mcg, 175 mcg, Nebulization, Daily, Amin, Ankit C, MD, 175 mcg at 07/05/23 0816   sodium chloride flush (NS) 0.9 % injection 10 mL, 10 mL, Intrapleural, Q8H, Aidric Endicott, Lesia Sago, MD   sodium chloride flush (NS) 0.9 % injection 3 mL, 3 mL, Intravenous, Q12H, Opyd, Timothy S, MD, 3 mL at 07/04/23 2105   traZODone (DESYREL) tablet 50 mg, 50 mg, Oral, QHS PRN, Miguel Rota, MD     Significant Hospital Events:  06/30/2023 - admit -> CT angiogram rule out pulmonary embolism.  Small left-sided pleural effusion.  Significant left lower lobe  consolidation 07/03/2023: Worsening chest x-ray with significant whiteout on the left side.  S/p thoracentesis by IR draining only 320 cc.  Simple exudate -> post [place]ora chest x-ray without change 3/28 chest tube placed via IR    Interim History / Subjective:   NAEON, Chest tube placed, ~400 cc output, CXR improved  Objective   Blood pressure 129/74, pulse 84, temperature 99 F (37.2 C), temperature source Oral, resp. rate 20, height 5\' 2"  (1.575 m), weight  90.7 kg, SpO2 94%.        Intake/Output Summary (Last 24 hours) at 07/05/2023 1040 Last data filed at 07/05/2023 0700 Gross per 24 hour  Intake 100 ml  Output 1025 ml  Net -925 ml   Filed Weights   06/30/23 0046  Weight: 90.7 kg    Examination: General: sitting up, in NAD HENT: AT/[place] Lungs: NWOB Cardiovascular: Normal hear sounds Abdomen: soft. Non tender Extremities: intact Neuro: AxO3. Speech normal   Resolved Hospital Problem list   N/a  Assessment & Plan:   Presumed parapneumonic effusion: Initial CT scan with very small effusion, repeat chest x-ray in the setting of ongoing pleurisy revealed significant volume loss on the left loculated to significant atelectasis and enlarging pleural effusion also contributing.  This prompted a repeat CT scan which showed a relatively small albeit enlarged pleural effusion now significantly loculated different from before with almost total left lower lobe atelectasis on CT scan.  Again I think the majority of changes on chest x-ray related to atelectasis, there is signs of shift of trachea to the left or volume loss.  Initial thoracentesis revealed exudative effusion PMN predominant.  Chest tube placed 3/28 with appearance, development of loculated enlarging pleural effusion. -- Chest tube output is okay, given initial appearance of loculations and signs of ongoing effusion on chest x-ray recommend intrapleural lytic therapy, attempted administration but was unable to flush tube,  attempted to agitate with aspiration and small volume flushes but would not pull back anything and was unable to flush, she reported minor discomfort with attempts at aspiration -- Continue chest tube to suction, increased suction to -40 -- Daily chest x-ray while chest tube in place -- Pleural fluid culture Gram stain negative, no growth to date -- Antibiotics per primary  PCCM will continue to follow for evaluation and management of pleural effusion and chest tube.  Best practice (daily eval):   Per primary   Labs and imaging as per EMR and discussion in this note   Karren Burly, MD See Loretha Stapler

## 2023-07-05 NOTE — Plan of Care (Signed)

## 2023-07-06 ENCOUNTER — Inpatient Hospital Stay (HOSPITAL_COMMUNITY)

## 2023-07-06 DIAGNOSIS — J189 Pneumonia, unspecified organism: Secondary | ICD-10-CM | POA: Diagnosis not present

## 2023-07-06 DIAGNOSIS — J90 Pleural effusion, not elsewhere classified: Secondary | ICD-10-CM | POA: Diagnosis not present

## 2023-07-06 LAB — BASIC METABOLIC PANEL WITH GFR
Anion gap: 12 (ref 5–15)
BUN: 14 mg/dL (ref 8–23)
CO2: 24 mmol/L (ref 22–32)
Calcium: 8.8 mg/dL — ABNORMAL LOW (ref 8.9–10.3)
Chloride: 95 mmol/L — ABNORMAL LOW (ref 98–111)
Creatinine, Ser: 0.76 mg/dL (ref 0.44–1.00)
GFR, Estimated: >60 mL/min (ref >60)
Glucose, Bld: 173 mg/dL — ABNORMAL HIGH (ref 70–99)
Potassium: 4.1 mmol/L (ref 3.5–5.1)
Sodium: 131 mmol/L — ABNORMAL LOW (ref 135–145)

## 2023-07-06 LAB — CBC
HCT: 40.2 % (ref 36.0–46.0)
Hemoglobin: 13.1 g/dL (ref 12.0–15.0)
MCH: 29.8 pg (ref 26.0–34.0)
MCHC: 32.6 g/dL (ref 30.0–36.0)
MCV: 91.6 fL (ref 80.0–100.0)
Platelets: 434 10*3/uL — ABNORMAL HIGH (ref 150–400)
RBC: 4.39 MIL/uL (ref 3.87–5.11)
RDW: 14.4 % (ref 11.5–15.5)
WBC: 17.8 10*3/uL — ABNORMAL HIGH (ref 4.0–10.5)
nRBC: 0 % (ref 0.0–0.2)

## 2023-07-06 LAB — GLUCOSE, CAPILLARY
Glucose-Capillary: 184 mg/dL — ABNORMAL HIGH (ref 70–99)
Glucose-Capillary: 249 mg/dL — ABNORMAL HIGH (ref 70–99)
Glucose-Capillary: 284 mg/dL — ABNORMAL HIGH (ref 70–99)
Glucose-Capillary: 250 mg/dL — ABNORMAL HIGH (ref 70–99)

## 2023-07-06 LAB — BODY FLUID CULTURE W GRAM STAIN
Culture: NO GROWTH
Gram Stain: NONE SEEN

## 2023-07-06 LAB — CULTURE, RESPIRATORY W GRAM STAIN

## 2023-07-06 LAB — MAGNESIUM: Magnesium: 1.8 mg/dL (ref 1.7–2.4)

## 2023-07-06 MED ORDER — ALTEPLASE 2 MG IJ SOLR
10.0000 mg | Freq: Once | INTRAMUSCULAR | Status: AC
  Administered 2023-07-06: 10 mg via INTRAPLEURAL
  Filled 2023-07-06: qty 10

## 2023-07-06 MED ORDER — STERILE WATER FOR INJECTION IJ SOLN
5.0000 mg | Freq: Once | RESPIRATORY_TRACT | Status: AC
  Administered 2023-07-06: 5 mg via INTRAPLEURAL
  Filled 2023-07-06: qty 5

## 2023-07-06 MED ORDER — LINEZOLID 600 MG/300ML IV SOLN
600.0000 mg | Freq: Two times a day (BID) | INTRAVENOUS | Status: DC
  Administered 2023-07-06 – 2023-07-11 (×11): 600 mg via INTRAVENOUS
  Filled 2023-07-06 (×11): qty 300

## 2023-07-06 NOTE — Plan of Care (Signed)

## 2023-07-06 NOTE — Progress Notes (Signed)
 PROGRESS NOTE    Jade Perez  EXB:[id number] DOB: 09/25/1955 DOA: 06/30/2023 PCP: Abner Greenspan, MD    Brief Narrative:  68 year old with history of HTN, HLD, DM2, OSA, depression, asthma, NSTEMI with normal coronaries admitted for shortness of breath and cough.  Outpatient diagnosed of left lower lobe pneumonia on 3/22 but returns due to worsening of her pain and discomfort.  CTA chest negative for PE but does show left lower lobe consolidation started on Rocephin and azithromycin.  Unfortunately chest x-ray showed worsening of left-sided pleural effusion, thoracentesis on 3/27 yielded 220 cc with due to persistence of this effusion, CT was performed which showed concerns of loculation therefore chest tube placed 3/28.  Assessment & Plan:  Principal Problem:   CAP (community acquired pneumonia) Active Problems:   Intrinsic asthma   Type 2 diabetes mellitus without complication, without long-term current use of insulin (HCC)   Acute respiratory failure with hypoxia (HCC)   Hypokalemia   Hypertension   Depression   OSA (obstructive sleep apnea)   Hyperlipidemia, mixed    Left lower lobe community-acquired pneumonia Left-sided pleural effusion, worsening Failed outpatient therapy.  Leukocytosis slightly up today, CTA chest negative for PE but showed left-sided lower lobe consolidation therefore on IV antibiotics.  X-ray showed worsening left-sided pleural effusion, thoracentesis on 3/27 yielded 320 cc. Micro is neg. repeat CT showed persistence of this effusion, concerns of loculation therefore chest tube placed 3/28. -Chest x-ray  shows improved aeration, received lytics by Pulm on 3/29. 600cc output -Ambulatory pulse ox   Hypokalemia/hypophosphatemia Replete as needed   Essential hypertension Norvasc.  IV as needed   Hyperlipidemia Statin   Diabetes mellitus type 2 - A1c was 6.5% in September 2023  -Holding home Ozempic.  Continue Amaryl Sliding scale and Accu-Cheks    History of asthma and OSA Bronchodilators   Depression - Celexa    Smudge cells - Smudge cells noted on peripheral smear, percentage not specified  - Could be d/t the infection but she will need further workup with flow cytometry if leukocytosis persists   PT/OT-no follow-up needed Ambulatory pulse ox  DVT prophylaxis: Lovenox    Code Status: Full Code Family Communication: Husband at bedside Status is: Inpatient Remains inpatient appropriate because: Continue hospital side for abnormal breath sounds.  Chest tube management by pulmonary   Subjective: Seen and examined at bedside.  Patient tells me that she feels like she has a break in her lung but overall breathing has shown some improvement.  Examination:  General exam: Appears calm and comfortable  Respiratory system: Left side diminished breath sounds, better air movement Cardiovascular system: S1 & S2 heard, RRR. No JVD, murmurs, rubs, gallops or clicks. No pedal edema. Gastrointestinal system: Abdomen is nondistended, soft and nontender. No organomegaly or masses felt. Normal bowel sounds heard. Central nervous system: Alert and oriented. No focal neurological deficits. Extremities: Symmetric 5 x 5 power. Skin: Blood clot around chest tube insertion site s Psychiatry: Judgement and insight appear normal. Mood & affect appropriate. Left-sided chest tube               Diet Orders (From admission, onward)     Start     Ordered   07/04/23 1738  Diet regular Room service appropriate? Yes; Fluid consistency: Thin  Diet effective now       Question Answer Comment  Room service appropriate? Yes   Fluid consistency: Thin      07/04/23 1737  Objective: Vitals:   07/05/23 2046 07/05/23 2320 07/06/23 0541 07/06/23 0818  BP: 137/81  (!) 142/90   Pulse: 81  85   Resp: 18  18   Temp: 98 F (36.7 C)  98 F (36.7 C)   TempSrc: Oral  Oral   SpO2: 92% 92% 90% 93%  Weight:      Height:         Intake/Output Summary (Last 24 hours) at 07/06/2023 1037 Last data filed at 07/06/2023 0900 Gross per 24 hour  Intake 270 ml  Output 710 ml  Net -440 ml   Filed Weights   06/30/23 0046  Weight: 90.7 kg    Scheduled Meds:  alteplase (CATHFLO ACTIVASE) 10 mg in sodium chloride (PF) 0.9 % 30 mL  10 mg Intrapleural Once   And   dornase alfa (PULMOZYME) 5 mg in sterile water (preservative free) 30 mL  5 mg Intrapleural Once   amLODipine  5 mg Oral Daily   arformoterol  15 mcg Nebulization BID   atorvastatin  40 mg Oral QHS   citalopram  20 mg Oral Daily   enoxaparin (LOVENOX) injection  40 mg Subcutaneous Q24H   fenofibrate  160 mg Oral Daily   ferrous sulfate  325 mg Oral Q breakfast   glimepiride  0.5 mg Oral BID   guaiFENesin  10 mL Oral Q6H   insulin aspart  0-5 Units Subcutaneous QHS   insulin aspart  0-6 Units Subcutaneous TID WC   insulin glargine-yfgn  10 Units Subcutaneous Daily   lidocaine  2 patch Transdermal Q24H   montelukast  10 mg Oral QHS   pantoprazole  40 mg Oral Daily   revefenacin  175 mcg Nebulization Daily   sodium chloride flush  10 mL Intrapleural Q8H   sodium chloride flush  3 mL Intravenous Q12H   Continuous Infusions:  cefTRIAXone (ROCEPHIN)  IV 1 g (07/06/23 0235)    Nutritional status     Body mass index is 36.58 kg/m.  Data Reviewed:   CBC: Recent Labs  Lab 06/30/23 0103 07/01/23 0550 07/02/23 0556 07/03/23 0540 07/04/23 0510 07/05/23 0836 07/06/23 0705  WBC 20.1*   < > 13.1* 15.6* 14.6* 14.0* 17.8*  NEUTROABS 16.7*  --   --   --   --   --   --   HGB 13.0   < > 11.8* 12.5 11.9* 12.2 13.1  HCT 37.6   < > 36.0 37.7 35.6* 38.2 40.2  MCV 89.5   < > 93.8 90.8 91.0 92.7 91.6  PLT 266   < > 321 335 337 393 434*   < > = values in this interval not displayed.   Basic Metabolic Panel: Recent Labs  Lab 06/30/23 0804 07/01/23 0550 07/02/23 0556 07/03/23 0540 07/04/23 0510 07/05/23 0836 07/06/23 0705  NA 134* 131* 132* 133*  133* 135 131*  K 3.3* 3.4* 3.6 3.4* 3.7 4.0 4.1  CL 101 97* 97* 97* 99 100 95*  CO2 20* 22 20* 23 22 22 24   GLUCOSE 159* 126* 155* 186* 216* 155* 173*  BUN 17 18 16 13 13 14 14   CREATININE 0.72 0.78 0.67 0.64 0.59 0.62 0.76  CALCIUM 9.2 8.7* 8.6* 8.8* 8.7* 8.8* 8.8*  MG 1.6* 2.2 2.1 1.7 1.7 1.7 1.8  PHOS 1.9* 1.9*  --   --   --   --   --    GFR: Estimated Creatinine Clearance: 69.5 mL/min (by C-G formula based on SCr of 0.76 mg/dL).  Liver Function Tests: No results for input(s): "AST", "ALT", "ALKPHOS", "BILITOT", "PROT", "ALBUMIN" in the last 168 hours. No results for input(s): "LIPASE", "AMYLASE" in the last 168 hours. No results for input(s): "AMMONIA" in the last 168 hours. Coagulation Profile: Recent Labs  Lab 07/04/23 1342  INR 1.9*   Cardiac Enzymes: No results for input(s): "CKTOTAL", "CKMB", "CKMBINDEX", "TROPONINI" in the last 168 hours. BNP (last 3 results) No results for input(s): "PROBNP" in the last 8760 hours. HbA1C: No results for input(s): "HGBA1C" in the last 72 hours. CBG: Recent Labs  Lab 07/05/23 0817 07/05/23 1218 07/05/23 1629 07/05/23 2048 07/06/23 0739  GLUCAP 153* 209* 218* 119* 184*   Lipid Profile: No results for input(s): "CHOL", "HDL", "LDLCALC", "TRIG", "CHOLHDL", "LDLDIRECT" in the last 72 hours. Thyroid Function Tests: No results for input(s): "TSH", "T4TOTAL", "FREET4", "T3FREE", "THYROIDAB" in the last 72 hours. Anemia Panel: No results for input(s): "VITAMINB12", "FOLATE", "FERRITIN", "TIBC", "IRON", "RETICCTPCT" in the last 72 hours. Sepsis Labs: Recent Labs  Lab 07/05/23 0836  PROCALCITON 0.78    Recent Results (from the past 240 hours)  Resp panel by RT-PCR (RSV, Flu A&B, Covid) Anterior Nasal Swab     Status: None   Collection Time: 06/28/23  7:16 PM   Specimen: Anterior Nasal Swab  Result Value Ref Range Status   SARS Coronavirus 2 by RT PCR NEGATIVE NEGATIVE Final    Comment: (NOTE) SARS-CoV-2 target nucleic acids  are NOT DETECTED.  The SARS-CoV-2 RNA is generally detectable in upper respiratory specimens during the acute phase of infection. The lowest concentration of SARS-CoV-2 viral copies this assay can detect is 138 copies/mL. A negative result does not preclude SARS-Cov-2 infection and should not be used as the sole basis for treatment or other patient management decisions. A negative result may occur with  improper specimen collection/handling, submission of specimen other than nasopharyngeal swab, presence of viral mutation(s) within the areas targeted by this assay, and inadequate number of viral copies(<138 copies/mL). A negative result must be combined with clinical observations, patient history, and epidemiological information. The expected result is Negative.  Fact Sheet for Patients:  BloggerCourse.com  Fact Sheet for Healthcare Providers:  SeriousBroker.it  This test is no t yet approved or cleared by the [place] FDA and  has been authorized for detection and/or diagnosis of SARS-CoV-2 by FDA under an Emergency Use Authorization (EUA). This EUA will remain  in effect (meaning this test can be used) for the duration of the COVID-19 declaration under Section 564(b)(1) of the Act, 21 U.S.C.section 360bbb-3(b)(1), unless the authorization is terminated  or revoked sooner.       Influenza A by PCR NEGATIVE NEGATIVE Final   Influenza B by PCR NEGATIVE NEGATIVE Final    Comment: (NOTE) The Xpert Xpress SARS-CoV-2/FLU/RSV plus assay is intended as an aid in the diagnosis of influenza from Nasopharyngeal swab specimens and should not be used as a sole basis for treatment. Nasal washings and aspirates are unacceptable for Xpert Xpress SARS-CoV-2/FLU/RSV testing.  Fact Sheet for Patients: BloggerCourse.com  Fact Sheet for Healthcare Providers: SeriousBroker.it  This test is  not yet approved or cleared by the [place] FDA and has been authorized for detection and/or diagnosis of SARS-CoV-2 by FDA under an Emergency Use Authorization (EUA). This EUA will remain in effect (meaning this test can be used) for the duration of the COVID-19 declaration under Section 564(b)(1) of the Act, 21 U.S.C. section 360bbb-3(b)(1), unless the authorization is terminated or revoked.  Resp Syncytial Virus by PCR NEGATIVE NEGATIVE Final    Comment: (NOTE) Fact Sheet for Patients: BloggerCourse.com  Fact Sheet for Healthcare Providers: SeriousBroker.it  This test is not yet approved or cleared by the [place] FDA and has been authorized for detection and/or diagnosis of SARS-CoV-2 by FDA under an Emergency Use Authorization (EUA). This EUA will remain in effect (meaning this test can be used) for the duration of the COVID-19 declaration under Section 564(b)(1) of the Act, 21 U.S.C. section 360bbb-3(b)(1), unless the authorization is terminated or revoked.  Performed at [facility], [address] [place], [place] [address]   Blood culture (routine x 2)     Status: None   Collection Time: 06/30/23  1:03 AM   Specimen: BLOOD  Result Value Ref Range Status   Specimen Description   Final    BLOOD LEFT ANTECUBITAL Performed at [facility], [address] [place], [place] [address]    Special Requests   Final    BOTTLES DRAWN AEROBIC AND ANAEROBIC Blood Culture adequate volume Performed at [facility], [address] [place], [place] [address]    Culture   Final    NO GROWTH 5 DAYS Performed at [facility], [address] [place], [place] [address]    Report Status 07/05/2023 FINAL  Final  Blood culture (routine x 2)     Status: None   Collection Time: 06/30/23  1:03 AM   Specimen: BLOOD RIGHT FOREARM  Result Value Ref Range Status   Specimen Description    Final    BLOOD RIGHT FOREARM Performed at [facility], [address] [place], [place] [address]    Special Requests   Final    BOTTLES DRAWN AEROBIC AND ANAEROBIC Blood Culture adequate volume Performed at [facility], [address] [place], [place] [address]    Culture   Final    NO GROWTH 5 DAYS Performed at [facility], [address] [place], [place] [address]    Report Status 07/05/2023 FINAL  Final  Expectorated Sputum Assessment w Gram Stain, Rflx to Resp Cult     Status: None   Collection Time: 07/02/23  4:43 PM   Specimen: Expectorated Sputum  Result Value Ref Range Status   Specimen Description EXPECTORATED SPUTUM  Final   Special Requests NONE  Final   Sputum evaluation   Final    THIS SPECIMEN IS ACCEPTABLE FOR SPUTUM CULTURE Performed at [facility], [address], [place], [place] [address]    Report Status 07/02/2023 FINAL  Final  Culture, Respiratory w Gram Stain     Status: None (Preliminary result)   Collection Time: 07/02/23  4:43 PM  Result Value Ref Range Status   Specimen Description   Final    EXPECTORATED SPUTUM Performed at [facility], [address] [place], [place] [address]    Special Requests   Final    NONE Reflexed from [id number] Performed at [facility], [address] [place], [place] [address]    Gram Stain   Final    FEW WBC PRESENT, PREDOMINANTLY PMN FEW GRAM POSITIVE COCCI RARE YEAST WITH PSEUDOHYPHAE RARE GRAM NEGATIVE RODS    Culture   Final    RARE STAPHYLOCOCCUS AUREUS RARE CANDIDA ALBICANS SUSCEPTIBILITIES TO FOLLOW Performed at [facility], [address] [place], [place] [address]  Report Status PENDING  Incomplete  Fungus Culture With Stain     Status: None (Preliminary result)   Collection Time: 07/03/23  2:24 PM   Specimen: PATH Cytology Pleural fluid  Result Value Ref Range Status   Fungus Stain Final report   Final    Comment: (NOTE) Performed At: [facility] [address] [place], [place] [id number] Jolene Schimke MD WU:[id number]    Fungus (Mycology) Culture PENDING  Incomplete   Fungal Source PLEURAL  Final    Comment: Performed at [facility], [address], [place], [place] [address]  Culture, Fungus without Smear     Status: None (Preliminary result)   Collection Time: 07/03/23  2:24 PM   Specimen: PATH Cytology Pleural fluid  Result Value Ref Range Status   Specimen Description   Final    PLEURAL Performed at [facility], [address] [place], [place] [address]    Special Requests   Final    NONE Performed at [facility], [address] [place], [place] [address]    Culture   Final    NO FUNGUS ISOLATED AFTER 3 DAYS Performed at [facility], [address] [place], [place] [address]    Report Status PENDING  Incomplete  Body fluid culture w Gram Stain     Status: None   Collection Time: 07/03/23  2:24 PM   Specimen: PATH Cytology Pleural fluid  Result Value Ref Range Status   Specimen Description   Final    PLEURAL Performed at [facility], [address] [place], [place] [address]    Special Requests   Final    NONE Performed at [facility], [address], [place], [place] [address]    Gram Stain NO WBC SEEN NO ORGANISMS SEEN   Final   Culture   Final    NO GROWTH 3 DAYS Performed at [facility], [address], [place], [place] [address]    Report Status 07/06/2023 FINAL  Final  Fungus Culture Result     Status: None   Collection Time: 07/03/23  2:24 PM  Result Value Ref Range Status   Result 1 Comment  Final    Comment: (NOTE) KOH/Calcofluor preparation:  no fungus observed. Performed At: [facility] [address] [place], [place] [id number] Jolene Schimke MD QV:[id number]   Respiratory (~20 pathogens) panel by PCR      Status: None   Collection Time: 07/04/23 10:04 AM   Specimen: Nasopharyngeal Swab; Respiratory  Result Value Ref Range Status   Adenovirus NOT DETECTED NOT DETECTED Final   Coronavirus 229E NOT DETECTED NOT DETECTED Final    Comment: (NOTE) The Coronavirus on the Respiratory Panel, DOES NOT test for the novel  Coronavirus (2019 nCoV)    Coronavirus HKU1 NOT DETECTED NOT DETECTED Final   Coronavirus NL63 NOT DETECTED NOT DETECTED Final   Coronavirus OC43 NOT DETECTED NOT DETECTED Final   Metapneumovirus NOT DETECTED NOT DETECTED Final   Rhinovirus / Enterovirus NOT DETECTED NOT DETECTED Final   Influenza A NOT DETECTED NOT DETECTED Final   Influenza B NOT DETECTED NOT DETECTED Final   Parainfluenza Virus 1 NOT DETECTED NOT DETECTED Final   Parainfluenza Virus 2 NOT DETECTED NOT DETECTED Final   Parainfluenza Virus 3 NOT DETECTED NOT DETECTED Final   Parainfluenza Virus 4 NOT DETECTED NOT DETECTED Final   Respiratory Syncytial Virus NOT DETECTED NOT DETECTED Final   Bordetella pertussis  NOT DETECTED NOT DETECTED Final   Bordetella Parapertussis NOT DETECTED NOT DETECTED Final   Chlamydophila pneumoniae NOT DETECTED NOT DETECTED Final   Mycoplasma pneumoniae NOT DETECTED NOT DETECTED Final    Comment: Performed at [facility], [address] [place], [place] [address]         Radiology Studies: DG CHEST PORT 1 VIEW Result Date: 07/06/2023 CLINICAL DATA:  [id number] Chest tube in place [id number] EXAM: PORTABLE CHEST 1 VIEW COMPARISON:  07/05/2023 FINDINGS: Left chest tube remains in place. Large left-sided pleural effusion has increased. Associated left basilar opacity. Band-like atelectasis at the right lung base. No pneumothorax. Stable heart size. IMPRESSION: Large left-sided pleural effusion has increased. Left chest tube remains in place. Electronically Signed   By: Duanne Guess D.O.   On: 07/06/2023 10:14   DG CHEST PORT 1 VIEW Result Date: 07/05/2023 CLINICAL DATA:   Productive cough.  Chest tube in place. EXAM: PORTABLE CHEST 1 VIEW COMPARISON:  CT chest from yesterday. Chest x-ray dated July 03, 2023. FINDINGS: Decreased now moderate loculated left pleural effusion status post chest tube placement. No pneumothorax. Improved aeration of the left upper lobe. Continued consolidation and/or atelectasis in the left lower lobe. New mild right basilar atelectasis. No acute osseous abnormality. IMPRESSION: 1. Decreased now moderate loculated left pleural effusion status post chest tube placement. No pneumothorax. 2. Improved aeration of the left upper lobe. Continued consolidation and/or atelectasis in the left lower lobe. Electronically Signed   By: Obie Dredge M.D.   On: 07/05/2023 11:55   CT GUIDED SOFT TISSUE FLUID DRAIN BY PERC CATH Result Date: 07/05/2023 INDICATION: 68 year old female with complicated left pneumonia and parapneumonic effusion. EXAM: CT-guided placement of chest tube TECHNIQUE: Multidetector CT imaging of the chest was performed following the standard protocol without IV contrast. RADIATION DOSE REDUCTION: This exam was performed according to the departmental dose-optimization program which includes automated exposure control, adjustment of the mA and/or kV according to patient size and/or use of iterative reconstruction technique. MEDICATIONS: None ANESTHESIA/SEDATION: None.  Patient was given Versed IV for anxiolysis. COMPLICATIONS: None immediate. PROCEDURE: Informed written consent was obtained from the patient after a thorough discussion of the procedural risks, benefits and alternatives. All questions were addressed. Maximal Sterile Barrier Technique was utilized including caps, mask, sterile gowns, sterile gloves, sterile drape, hand hygiene and skin antiseptic. A timeout was performed prior to the initiation of the procedure. A CT scan of the chest was performed. The loculated fluid was identified. A suitable skin entry site was selected and  marked. The skin was sterilely prepped and draped in standard fashion using chlorhexidine skin prep. Local anesthesia was attained by infiltration with 1% lidocaine. A small dermatotomy was made. Under intermittent CT guidance, an 18 gauge trocar needle was carefully advanced over the rib and into the fluid from an anterior approach. A 0.035 wire was then coiled in the fluid collection. The tract was dilated to 81 [place]. A 12 [place] skater drain was advanced over the wire and formed in the fluid. There was return of yellow pleural fluid. The catheter was secured in place with an 0 Prolene suture and connected to low wall suction via a pleur-evac device. An air tight bandage was applied. IMPRESSION: Successful placement of a 1 French thoracostomy tube using CT guidance via a left anterior approach. Electronically Signed   By: Malachy Moan M.D.   On: 07/05/2023 06:55   CT CHEST WO CONTRAST Result Date: 07/04/2023 CLINICAL DATA:  Empyema. Recently  diagnosed with left lower lobe pneumonia with worsening chest pain and enlarging pleural effusion. Thoracentesis performed yesterday. History of diabetes and hypertension. EXAM: CT CHEST WITHOUT CONTRAST TECHNIQUE: Multidetector CT imaging of the chest was performed following the standard protocol without IV contrast. RADIATION DOSE REDUCTION: This exam was performed according to the departmental dose-optimization program which includes automated exposure control, adjustment of the mA and/or kV according to patient size and/or use of iterative reconstruction technique. COMPARISON:  Radiographs 07/03/2023 and 06/30/2023. Chest CTA 06/30/2023. FINDINGS: Cardiovascular: No significant vascular findings on noncontrast imaging. Mild aortic and coronary artery atherosclerosis noted. The heart size is normal. There is no pericardial effusion. Mediastinum/Nodes: Stable mildly prominent subcarinal and hilar lymph nodes bilaterally, likely reactive. Small hiatal hernia.The  thyroid gland and trachea appear unremarkable. Lungs/Pleura: Moderate to large left pleural effusion has significantly enlarged from the previous CT of 4 days ago and has loculated components laterally measuring approximately 13.8 x 5.5 x 11.5 cm, consistent with reported empyema. There is resulting increased compressive atelectasis of the left lower lobe and lingula. Underlying left lower lobe consolidation. No evidence of pneumothorax. Trace dependent right pleural effusion. The right lung is clear. Upper abdomen: No acute findings are seen in the visualized upper abdomen. Hepatic steatosis noted. Possible punctate nonobstructing bilateral renal calculi. Musculoskeletal/Chest wall: There is no chest wall mass or suspicious osseous finding. IMPRESSION: 1. Significant enlargement of left pleural effusion with loculated components laterally, consistent with reported empyema. This effusion is suboptimally characterized by this noncontrast study. Consider chest tube placement. 2. Resulting increased compressive atelectasis of the left lower lobe and lingula. Underlying left lower lobe consolidation consistent with pneumonia. 3. Trace dependent right pleural effusion.  Clear right lung. 4. Stable mildly prominent mediastinal and hilar lymph nodes, likely reactive. 5. Hepatic steatosis. Possible punctate nonobstructing bilateral renal calculi. 6.  Aortic Atherosclerosis (ICD10-I70.0). Electronically Signed   By: Carey Bullocks M.D.   On: 07/04/2023 14:58           LOS: 6 days   Time spent= 35 mins    Miguel Rota, MD [facility]  If 7PM-7AM, please contact night-coverage  07/06/2023, 10:37 AM

## 2023-07-06 NOTE — Procedures (Signed)
 Pleural Fibrinolytic Administration Procedure Note  Jade Perez  [id number]  1956/02/24  Date:07/06/23  Time:11:07 AM   Provider Performing:Mallisa Alameda R Josiah Nieto   Procedure: Pleural Fibrinolysis Subsequent day [phone])  Indication(s) Fibrinolysis of complicated pleural effusion  Consent Risks of the procedure as well as the alternatives and risks of each were explained to the patient and/or caregiver.  Consent for the procedure was obtained.   Anesthesia None   Time Out Verified patient identification, verified procedure, site/side was marked, verified correct patient position, special equipment/implants available, medications/allergies/relevant history reviewed, required imaging and test results available.   Sterile Technique Hand hygiene, gloves   Procedure Description Existing pleural catheter was cleaned and accessed in sterile manner.  10mg  of tPA in 30cc of saline and 5mg  of dornase in 30cc of sterile water were injected into pleural space using existing pleural catheter.  Catheter will be clamped for 1 hour and then placed back to suction - discussed with bedside RN to unclamp at 12 pm.   Complications/Tolerance None; patient tolerated the procedure well.  EBL None   Specimen(s) None

## 2023-07-06 NOTE — TOC Progression Note (Signed)
 Transition of Care [facility]) - Progression Note    Patient Details  Name: Jade Perez MRN: [id number] Date of Birth: 1955/04/13  Transition of Care [facility]) CM/SW Contact  Diona Browner, [place] Phone Number: 07/06/2023, 12:16 PM  Clinical Narrative:    Pt recommended for HHPT. Pt in agreement with recommendation. HHPT setup w/ [facility].    Expected Discharge Plan: Home/Self Care    Expected Discharge Plan and Services                                               Social Determinants of Health (SDOH) Interventions SDOH Screenings   Food Insecurity: Food Insecurity Present (06/30/2023)  Housing: Low Risk  (06/30/2023)  Transportation Needs: No Transportation Needs (06/30/2023)  Utilities: Not At Risk (06/30/2023)  Social Connections: Socially Integrated (06/30/2023)  Tobacco Use: Low Risk  (06/30/2023)    Readmission Risk Interventions    06/30/2023   11:03 AM  Readmission Risk Prevention Plan  Post Dischage Appt Complete  Medication Screening Complete  Transportation Screening Complete

## 2023-07-06 NOTE — Plan of Care (Signed)
  Problem: Education: Goal: Knowledge of General Education information will improve Description: Including pain rating scale, medication(s)/side effects and non-pharmacologic comfort measures Outcome: Progressing   Problem: Clinical Measurements: Goal: Ability to maintain clinical measurements within normal limits will improve Outcome: Progressing Goal: Diagnostic test results will improve Outcome: Progressing Goal: Respiratory complications will improve Outcome: Progressing   Problem: Pain Managment: Goal: General experience of comfort will improve and/or be controlled Outcome: Progressing   Problem: Education: Goal: Ability to describe self-care measures that may prevent or decrease complications (Diabetes Survival Skills Education) will improve Outcome: Progressing

## 2023-07-06 NOTE — TOC CM/SW Note (Signed)

## 2023-07-06 NOTE — Progress Notes (Signed)
 Pt currently on 3L [place] and is tolerating well. No distress noted. When asked patient if she wanted to go on CPAP tonight patient stated she wanted to stay on [place] and not wear CPAP. Patient has been refusing to wear CPAP for past few nights.  Will continue to monitor patient.

## 2023-07-06 NOTE — Progress Notes (Signed)
 NAME:  Jade Perez, MRN:  [id number], DOB:  [birth year], LOS: 6 ADMISSION DATE:  06/30/2023, CONSULTATION DATE:  07/04/2023 REFERRING MD:  DR Nelson Chimes, CHIEF COMPLAINT:  LLL white out    History of Present Illness:   68 year old female with past medical history of non-smoking, hypertension, hyperlipidemia, type 2 diabetes, OSA, depression, asthma not otherwise specified presented on 06/30/2023 with cough worsening dyspnea and pleuritic chest pain on the left lower lobe. PRior to that had sick resp contact with grandchild who live with her and t hen she got sick  CT chest showed left lower lobe consolidation with a small pleural effusion [personal visualization of the CCM MD on this consult] she was hypoxemic requiring oxygen.  She had leukocytosis with white count 20,100.  Pulmonary embolism was ruled out started on Treatment.  Subsequently urine strep and Legionella negative.  Respiratory virus panel for COVID, flu and RSV negative [20 panel not done].  Since admission has been afebrile.  Has improving leukocytosis from 20,002 currently 14,600.  Cultures remain negative.  On 07/03/2023 underwent left-sided thoracentesis removing 350 cc by interventional radiology.  This shows a simple exudate with LDH 633 and acute neutrophilic leukocytosis with polymorphs of 86%.  No eosinophils.  Despite all these interventions has remained on 4 L nasal cannula.  She has been covered by IV antibiotics with ceftriaxone and azithromycin since admission and due to and on 07/04/2023 [facility] have PCCM consult].  Of note prior to admission she was treated as outpatient with antibiotics and then failed that.   Pulmonary is now been consulted with a question of residual loculated effusion  Past Medical History:    has a past medical history of Allergic rhinitis, Asthma, mild intermittent, Complication of anesthesia, Depression, GAD (generalized anxiety disorder), GERD (gastroesophageal reflux disease), Hiatal hernia,  History of cardiomyopathy in adulthood (12/06/2021), History of heart murmur in childhood, History of kidney stones, History of non-ST elevation myocardial infarction (NSTEMI) (12/06/2021), Hyperlipidemia, mixed, Hypertension, IDA (iron deficiency anemia), OA (osteoarthritis), OSA (obstructive sleep apnea), Renal disorder, Type 2 diabetes mellitus (HCC) (2008), and Wears glasses.   reports that she has never smoked. She has never used smokeless tobacco.  Past Surgical History:  Procedure Laterality Date   BREAST EXCISIONAL BIOPSY Right    x2  last one 1980s , benign   CARPAL TUNNEL RELEASE Right 05/22/2017   Procedure: RIGHT CARPAL TUNNEL RELEASE;  Surgeon: Cindee Salt, MD;  Location: [facility];  Service: Orthopedics;  Laterality: Right;   CARPAL TUNNEL RELEASE Left 03/12/2018   Procedure: LEFT CARPAL TUNNEL RELEASE;  Surgeon: Cindee Salt, MD;  Location: [facility];  Service: Orthopedics;  Laterality: Left;   CYSTOSCOPY W/ URETERAL STENT PLACEMENT Bilateral 12/11/2021   Procedure: CYSTOSCOPY , BILATERAL RETROGRADE , BILATERAL STENT PLACEMENT;  Surgeon: Sebastian Ache, MD;  Location: [facility] OR;  Service: Urology;  Laterality: Bilateral;   CYSTOSCOPY WITH RETROGRADE PYELOGRAM, URETEROSCOPY AND STENT PLACEMENT Bilateral 01/02/2022   Procedure: CYSTOSCOPY WITH RETROGRADE PYELOGRAM, URETEROSCOPY AND STENT REPLACEMENT;  Surgeon: Sebastian Ache, MD;  Location: [facility];  Service: Urology;  Laterality: Bilateral;   DIAGNOSTIC LAPAROSCOPY     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   DILATION AND CURETTAGE OF UTERUS     yrs ago for missed ab   EXTRACORPOREAL SHOCK WAVE LITHOTRIPSY  03/2017   HOLMIUM LASER APPLICATION Bilateral 01/02/2022   Procedure: HOLMIUM LASER APPLICATION;  Surgeon: Sebastian Ache, MD;  Location: [facility];  Service: Urology;  Laterality: Bilateral;   LAPAROSCOPIC APPENDECTOMY  2008   LEFT HEART CATH AND CORONARY ANGIOGRAPHY N/A  12/07/2021   Procedure: LEFT HEART CATH AND CORONARY ANGIOGRAPHY;  Surgeon: Lyn Records, MD;  Location: [facility];  Service: Cardiovascular;  Laterality: N/A;   LIPOMA EXCISION  2003   right shoulder   SCLERAL BUCKLE  10/15/2011   Procedure: SCLERAL BUCKLE;  Surgeon: Sherrie George, MD;  Location: [facility] OR;  Service: Ophthalmology;  Laterality: Right;   SHOULDER ARTHROSCOPY WITH ROTATOR CUFF REPAIR Right 03/25/2019   Procedure: Right shoulder arthroscopy, subacromial decompression, distal clavicle resection, rotator cuff repair;  Surgeon: Francena Hanly, MD;  Location: [facility];  Service: Orthopedics;  Laterality: Right;    WISDOM TOOTH EXTRACTION      Allergies  Allergen Reactions   Amoxicillin Hives    Did it involve swelling of the face/tongue/throat, SOB, or low BP? No Did it involve sudden or severe rash/hives, skin peeling, or any reaction on the inside of your mouth or nose? No Did you need to seek medical attention at a hospital or doctor's office? No When did it last happen?      15-20 years ago If all above answers are "NO", may proceed with cephalosporin use.    Aspirin Swelling   Bee Venom Hives and Other (See Comments)    cellulitis   Clarithromycin Nausea And Vomiting   Erythromycin Nausea Only   Ibuprofen Swelling   Oxycodone Itching   Sulfa Antibiotics Hives   Latex Rash   Tape Rash and Other (See Comments)    Band-Aids, also = blisters     There is no immunization history on file for this patient.  Family History  Problem Relation Age of Onset   Unexplained death Sister 49       died in her sleep   Colon cancer Paternal Grandmother    Colon cancer Paternal Uncle    Allergic rhinitis Neg Hx    Angioedema Neg Hx    Asthma Neg Hx    Atopy Neg Hx    Eczema Neg Hx    Immunodeficiency Neg Hx    Urticaria Neg Hx    Colon polyps Neg Hx    Esophageal cancer Neg Hx    Stomach cancer Neg Hx    Rectal cancer Neg Hx    Breast cancer Neg Hx       Current Facility-Administered Medications:    acetaminophen (TYLENOL) tablet 650 mg, 650 mg, Oral, Q6H PRN, 650 mg at 07/04/23 2106 **OR** acetaminophen (TYLENOL) suppository 650 mg, 650 mg, Rectal, Q6H PRN, Opyd, Lavone Neri, MD   alteplase (CATHFLO ACTIVASE) 10 mg in sodium chloride (PF) 0.9 % 30 mL, 10 mg, Intrapleural, Once **AND** dornase alfa (PULMOZYME) 5 mg in sterile water (preservative free) 30 mL, 5 mg, Intrapleural, Once, Medha Pippen, Lesia Sago, MD   amLODipine (NORVASC) tablet 5 mg, 5 mg, Oral, Daily, Opyd, Lavone Neri, MD, 5 mg at 07/06/23 0906   arformoterol (BROVANA) nebulizer solution 15 mcg, 15 mcg, Nebulization, BID, Amin, Ankit C, MD, 15 mcg at 07/06/23 0818   atorvastatin (LIPITOR) tablet 40 mg, 40 mg, Oral, QHS, Opyd, Lavone Neri, MD, 40 mg at 07/05/23 2104   bisacodyl (DULCOLAX) EC tablet 5 mg, 5 mg, Oral, Daily PRN, Opyd, Lavone Neri, MD   cefTRIAXone (ROCEPHIN) 1 g in sodium chloride 0.9 % 100 mL IVPB, 1 g, Intravenous, Q24H, Amin, Ankit C, MD, Last Rate: 200 mL/hr at 07/06/23 0235, 1 g at 07/06/23  0235   citalopram (CELEXA) tablet 20 mg, 20 mg, Oral, Daily, Opyd, Lavone Neri, MD, 20 mg at 07/06/23 0906   cyclobenzaprine (FLEXERIL) tablet 5 mg, 5 mg, Oral, TID PRN, Amin, Ankit C, MD, 5 mg at 07/06/23 0906   enoxaparin (LOVENOX) injection 40 mg, 40 mg, Subcutaneous, Q24H, Opyd, Lavone Neri, MD, 40 mg at 07/06/23 0905   fenofibrate tablet 160 mg, 160 mg, Oral, Daily, Amin, Ankit C, MD, 160 mg at 07/06/23 0906   fentaNYL (SUBLIMAZE) injection 25 mcg, 25 mcg, Intravenous, Q2H PRN, Amin, Ankit C, MD, 25 mcg at 07/05/23 1957   ferrous sulfate tablet 325 mg, 325 mg, Oral, Q breakfast, Amin, Ankit C, MD, 325 mg at 07/06/23 0906   glimepiride (AMARYL) tablet 0.5 mg, 0.5 mg, Oral, BID, Amin, Ankit C, MD, 0.5 mg at 07/06/23 0906   guaiFENesin (ROBITUSSIN) 100 MG/5ML liquid 10 mL, 10 mL, Oral, Q6H, Amin, Ankit C, MD, 10 mL at 07/06/23 0904   hydrALAZINE (APRESOLINE) injection 10 mg, 10 mg,  Intravenous, Q4H PRN, Amin, Ankit C, MD   HYDROcodone-acetaminophen (NORCO/VICODIN) 5-325 MG per tablet 1 tablet, 1 tablet, Oral, Q4H PRN, Opyd, Lavone Neri, MD, 1 tablet at 07/06/23 0545   insulin aspart (novoLOG) injection 0-5 Units, 0-5 Units, Subcutaneous, QHS, Opyd, Lavone Neri, MD, 3 Units at 07/03/23 2103   insulin aspart (novoLOG) injection 0-6 Units, 0-6 Units, Subcutaneous, TID WC, Opyd, Lavone Neri, MD, 2 Units at 07/05/23 1804   insulin glargine-yfgn (SEMGLEE) injection 10 Units, 10 Units, Subcutaneous, Daily, Amin, Ankit C, MD, 10 Units at 07/06/23 0905   ipratropium-albuterol (DUONEB) 0.5-2.5 (3) MG/3ML nebulizer solution 3 mL, 3 mL, Nebulization, Q4H PRN, Amin, Ankit C, MD   lidocaine (LIDODERM) 5 % 2 patch, 2 patch, Transdermal, Q24H, Amin, Ankit C, MD, 2 patch at 07/06/23 0905   metoprolol tartrate (LOPRESSOR) injection 5 mg, 5 mg, Intravenous, Q4H PRN, Amin, Ankit C, MD   montelukast (SINGULAIR) tablet 10 mg, 10 mg, Oral, QHS, Opyd, Lavone Neri, MD, 10 mg at 07/05/23 2104   ondansetron (ZOFRAN) tablet 4 mg, 4 mg, Oral, Q6H PRN **OR** ondansetron (ZOFRAN) injection 4 mg, 4 mg, Intravenous, Q6H PRN, Opyd, Lavone Neri, MD, 4 mg at 07/03/23 1737   pantoprazole (PROTONIX) EC tablet 40 mg, 40 mg, Oral, Daily, Opyd, Lavone Neri, MD, 40 mg at 07/06/23 0906   polyethylene glycol (MIRALAX / GLYCOLAX) packet 17 g, 17 g, Oral, Daily PRN, Opyd, Lavone Neri, MD   revefenacin (YUPELRI) nebulizer solution 175 mcg, 175 mcg, Nebulization, Daily, Amin, Ankit C, MD, 175 mcg at 07/06/23 0818   sodium chloride flush (NS) 0.9 % injection 10 mL, 10 mL, Intrapleural, Q8H, Tiahna Cure, Lesia Sago, MD, 10 mL at 07/05/23 1815   sodium chloride flush (NS) 0.9 % injection 3 mL, 3 mL, Intravenous, Q12H, Opyd, Lavone Neri, MD, 3 mL at 07/06/23 0906   traZODone (DESYREL) tablet 50 mg, 50 mg, Oral, QHS PRN, Miguel Rota, MD     Significant Hospital Events:  06/30/2023 - admit -> CT angiogram rule out pulmonary embolism.  Small  left-sided pleural effusion.  Significant left lower lobe consolidation 07/03/2023: Worsening chest x-ray with significant whiteout on the left side.  S/p thoracentesis by IR draining only 320 cc.  Simple exudate -> post Thora chest x-ray without change 3/28 chest tube placed via IR 3/29 1st dose lytics after IR was able to troubleshoot tube after would not flush or aspirate     Interim History / Subjective:   NAEON, output  improved after lytics now slightly blood tinged, CXR looks slightly better still with significant effusion  Objective   Blood pressure (!) 142/90, pulse 85, temperature 98 F (36.7 C), temperature source Oral, resp. rate 18, height 5\' 2"  (1.575 m), weight 90.7 kg, SpO2 93%.        Intake/Output Summary (Last 24 hours) at 07/06/2023 0935 Last data filed at 07/06/2023 0900 Gross per 24 hour  Intake 510 ml  Output 710 ml  Net -200 ml   Filed Weights   06/30/23 0046  Weight: 90.7 kg    Examination: General: sitting up, in NAD HENT: AT/[place] Lungs: NWOB Cardiovascular: Normal sounds Abdomen: soft. Non tender Extremities: intact Neuro: AxO3. Speech normal   Resolved Hospital Problem list   N/a  Assessment & Plan:   Presumed parapneumonic effusion: Initial CT scan with very small effusion, repeat chest x-ray in the setting of ongoing pleurisy revealed significant volume loss on the left loculated to significant atelectasis and enlarging pleural effusion also contributing.  This prompted a repeat CT scan which showed a relatively small albeit enlarged pleural effusion now significantly loculated different from before with almost total left lower lobe atelectasis on CT scan.  Again I think the majority of changes on chest x-ray related to atelectasis, there is signs of shift of trachea to the left or volume loss.  Initial thoracentesis revealed exudative effusion PMN predominant.  Chest tube placed 3/28 with appearance, development of loculated enlarging pleural  effusion. -- Chest tube output improved and CXR slightly improved after first dose lytics 3/29, continue lytic therapy 2nd dose 3/30 -- Continue chest tube to suction -- Daily chest x-ray while chest tube in place -- Pleural fluid culture Gram stain negative, no growth to date -- Antibiotics per primary  PCCM will continue to follow for evaluation and management of pleural effusion and chest tube.  Best practice (daily eval):   Per primary   Labs and imaging as per EMR and discussion in this note   Karren Burly, MD See Loretha Stapler

## 2023-07-07 ENCOUNTER — Inpatient Hospital Stay (HOSPITAL_COMMUNITY)

## 2023-07-07 DIAGNOSIS — J189 Pneumonia, unspecified organism: Secondary | ICD-10-CM | POA: Diagnosis not present

## 2023-07-07 DIAGNOSIS — J90 Pleural effusion, not elsewhere classified: Secondary | ICD-10-CM

## 2023-07-07 LAB — GLUCOSE, CAPILLARY
Glucose-Capillary: 247 mg/dL — ABNORMAL HIGH (ref 70–99)
Glucose-Capillary: 311 mg/dL — ABNORMAL HIGH (ref 70–99)
Glucose-Capillary: 216 mg/dL — ABNORMAL HIGH (ref 70–99)
Glucose-Capillary: 276 mg/dL — ABNORMAL HIGH (ref 70–99)

## 2023-07-07 LAB — OSMOLALITY, URINE: Osmolality, Ur: 628 mosm/kg (ref 300–900)

## 2023-07-07 LAB — BASIC METABOLIC PANEL WITH GFR
Anion gap: 12 (ref 5–15)
BUN: 14 mg/dL (ref 8–23)
CO2: 22 mmol/L (ref 22–32)
Calcium: 8.5 mg/dL — ABNORMAL LOW (ref 8.9–10.3)
Chloride: 91 mmol/L — ABNORMAL LOW (ref 98–111)
Creatinine, Ser: 0.71 mg/dL (ref 0.44–1.00)
GFR, Estimated: >60 mL/min (ref >60)
Glucose, Bld: 242 mg/dL — ABNORMAL HIGH (ref 70–99)
Potassium: 4.1 mmol/L (ref 3.5–5.1)
Sodium: 125 mmol/L — ABNORMAL LOW (ref 135–145)

## 2023-07-07 LAB — CBC
HCT: 42 % (ref 36.0–46.0)
Hemoglobin: 13.8 g/dL (ref 12.0–15.0)
MCH: 29.7 pg (ref 26.0–34.0)
MCHC: 32.9 g/dL (ref 30.0–36.0)
MCV: 90.3 fL (ref 80.0–100.0)
Platelets: 529 10*3/uL — ABNORMAL HIGH (ref 150–400)
RBC: 4.65 MIL/uL (ref 3.87–5.11)
RDW: 14.4 % (ref 11.5–15.5)
WBC: 20.7 10*3/uL — ABNORMAL HIGH (ref 4.0–10.5)
nRBC: 0 % (ref 0.0–0.2)

## 2023-07-07 LAB — OSMOLALITY: Osmolality: 285 mosm/kg (ref 275–295)

## 2023-07-07 LAB — SODIUM, URINE, RANDOM: Sodium, Ur: <10 mmol/L

## 2023-07-07 LAB — MAGNESIUM: Magnesium: 1.8 mg/dL (ref 1.7–2.4)

## 2023-07-07 MED ORDER — INSULIN ASPART 100 UNIT/ML IJ SOLN
3.0000 [IU] | Freq: Three times a day (TID) | INTRAMUSCULAR | Status: DC
  Administered 2023-07-07 – 2023-07-12 (×12): 3 [IU] via SUBCUTANEOUS

## 2023-07-07 MED ORDER — MAGIC MOUTHWASH W/LIDOCAINE
10.0000 mL | Freq: Once | ORAL | Status: AC | PRN
  Administered 2023-07-07: 10 mL via ORAL
  Filled 2023-07-07: qty 10

## 2023-07-07 MED ORDER — STERILE WATER FOR INJECTION IJ SOLN
5.0000 mg | Freq: Once | RESPIRATORY_TRACT | Status: AC
  Administered 2023-07-07: 5 mg via INTRAPLEURAL
  Filled 2023-07-07: qty 5

## 2023-07-07 MED ORDER — SODIUM CHLORIDE (PF) 0.9 % IJ SOLN
10.0000 mg | Freq: Once | INTRAMUSCULAR | Status: AC
  Administered 2023-07-07: 10 mg via INTRAPLEURAL
  Filled 2023-07-07: qty 10

## 2023-07-07 MED ORDER — INSULIN GLARGINE-YFGN 100 UNIT/ML ~~LOC~~ SOLN
14.0000 [IU] | Freq: Every day | SUBCUTANEOUS | Status: DC
  Administered 2023-07-07 – 2023-07-12 (×5): 14 [IU] via SUBCUTANEOUS
  Filled 2023-07-07 (×6): qty 0.14

## 2023-07-07 MED ORDER — SODIUM CHLORIDE 0.9 % IV BOLUS
500.0000 mL | Freq: Once | INTRAVENOUS | Status: AC
  Administered 2023-07-07: 500 mL via INTRAVENOUS

## 2023-07-07 NOTE — Progress Notes (Signed)
 Patient ID: Jade Perez, female   DOB: Jun 19, 1955, 68 y.o.   MRN: [id number]    Referring Physician(s): Kalman Shan, MD   Supervising Physician: Roanna Banning  Patient Status:  [facility] - In-pt  Chief Complaint:   Follow up left chest tube placement 07/04/23 in IR   Subjective:  Pt day 3 post op left sided chest tube placement for complicated pleural effusion. States she feels well, "coughing much less". Remains on antibiotics. Received initial pleural fibrinolysis 3/29 with critical care, received second pleural fibrinolysis treatment yesterday and plan for 3rd pleural fibrinolysis treatment today.  Allergies: Amoxicillin, Aspirin, Bee venom, Clarithromycin, Erythromycin, Ibuprofen, Oxycodone, Sulfa antibiotics, Latex, and Tape  Medications: Prior to Admission medications   Medication Sig Start Date End Date Taking? Authorizing Provider  acetaminophen (TYLENOL) 500 MG tablet Take 500 mg by mouth every 6 (six) hours as needed for mild pain.   Yes [provider]  albuterol (PROAIR HFA) 108 (90 Base) MCG/ACT inhaler Can inhale two puffs every four to six hours as needed for cough or wheeze. 06/08/20  Yes Kozlow, Alvira Philips, MD  amLODipine (NORVASC) 5 MG tablet Take 1 tablet (5 mg total) by mouth daily. Patient taking differently: Take 5 mg by mouth daily. 12/16/21  Yes Regalado, Belkys A, MD  atorvastatin (LIPITOR) 40 MG tablet Take 40 mg by mouth at bedtime.   Yes [provider]  b complex vitamins tablet Take 1 tablet by mouth at bedtime.   Yes [provider]  cephALEXin (KEFLEX) 500 MG capsule Take 1 capsule (500 mg total) by mouth 3 (three) times daily. 06/29/23  Yes Delo, Riley Lam, MD  cetirizine (ZYRTEC) 10 MG tablet Take 10 mg by mouth daily.   Yes [provider]  citalopram (CELEXA) 40 MG tablet Take 20 mg by mouth daily.   Yes [provider]  FARXIGA 10 MG TABS tablet Take 10 mg by mouth daily. 05/21/23  Yes [provider]   fenofibrate (TRICOR) 145 MG tablet Take 145 mg by mouth at bedtime. 09/22/19  Yes [provider]  Ferrous Sulfate (SLOW FE PO) Take 1 tablet by mouth daily.   Yes [provider]  glimepiride (AMARYL) 1 MG tablet Take 0.5 mg by mouth 2 (two) times daily. 02/15/19  Yes [provider]  indapamide (LOZOL) 1.25 MG tablet Take 1.25 mg by mouth daily. 03/10/23  Yes [provider]  montelukast (SINGULAIR) 10 MG tablet TAKE 1 TABLET BY MOUTH EVERY DAY Patient taking differently: Take 10 mg by mouth at bedtime. 07/19/19  Yes Kozlow, Alvira Philips, MD  nystatin cream (MYCOSTATIN) Apply 1 Application topically as needed for dry skin (itching, burning, redness). 05/02/23  Yes [provider]  omeprazole (PRILOSEC) 40 MG capsule TAKE 1 CAPSULE BY MOUTH EVERY DAY IN THE MORNING BEFORE BREAKFAST Patient taking differently: Take 40 mg by mouth daily. 07/30/21  Yes Kozlow, Alvira Philips, MD  Semaglutide, 1 MG/DOSE, 2 MG/1.5ML SOPN Inject 2 mg into the skin once a week. Wednesday's 05/25/20  Yes [provider]     Vital Signs: BP 120/80 (BP Location: Right Arm)   Pulse 87   Temp 98 F (36.7 C) (Oral)   Resp 18   Ht 5\' 2"  (1.575 m)   Wt 200 lb (90.7 kg)   SpO2 94%   BMI 36.58 kg/m   Physical Exam Vitals and nursing note reviewed.  Constitutional:      Appearance: Normal appearance. She is not ill-appearing or toxic-appearing.  HENT:  Mouth/Throat:     Mouth: Mucous membranes are moist.     Pharynx: Oropharynx is clear.  Cardiovascular:     Rate and Rhythm: Normal rate and regular rhythm.  Pulmonary:     Effort: Pulmonary effort is normal.     Comments: Chest tube from left anterior chest well. No external abnormalities. 50ml of hazy serous output noted at bedside - reportedly emptied at 3am - total over last 24h.   Diminished breath sounds of left lower lung with some fine crackles. On [place] O2. Abdominal:     Palpations: Abdomen is soft.      Tenderness: There is no abdominal tenderness.  Musculoskeletal:     Right lower leg: No edema.     Left lower leg: No edema.  Skin:    General: Skin is warm and dry.  Neurological:     General: No focal deficit present.     Mental Status: She is alert and oriented to person, place, and time. Mental status is at baseline.     Imaging: DG CHEST PORT 1 VIEW Result Date: 07/06/2023 CLINICAL DATA:  [id number] Chest tube in place [id number] EXAM: PORTABLE CHEST 1 VIEW COMPARISON:  07/05/2023 FINDINGS: Left chest tube remains in place. Large left-sided pleural effusion has increased. Associated left basilar opacity. Band-like atelectasis at the right lung base. No pneumothorax. Stable heart size. IMPRESSION: Large left-sided pleural effusion has increased. Left chest tube remains in place. Electronically Signed   By: Duanne Guess D.O.   On: 07/06/2023 10:14   DG CHEST PORT 1 VIEW Result Date: 07/05/2023 CLINICAL DATA:  Productive cough.  Chest tube in place. EXAM: PORTABLE CHEST 1 VIEW COMPARISON:  CT chest from yesterday. Chest x-ray dated July 03, 2023. FINDINGS: Decreased now moderate loculated left pleural effusion status post chest tube placement. No pneumothorax. Improved aeration of the left upper lobe. Continued consolidation and/or atelectasis in the left lower lobe. New mild right basilar atelectasis. No acute osseous abnormality. IMPRESSION: 1. Decreased now moderate loculated left pleural effusion status post chest tube placement. No pneumothorax. 2. Improved aeration of the left upper lobe. Continued consolidation and/or atelectasis in the left lower lobe. Electronically Signed   By: Obie Dredge M.D.   On: 07/05/2023 11:55   CT GUIDED SOFT TISSUE FLUID DRAIN BY PERC CATH Result Date: 07/05/2023 INDICATION: 68 year old female with complicated left pneumonia and parapneumonic effusion. EXAM: CT-guided placement of chest tube TECHNIQUE: Multidetector CT imaging of the chest was performed  following the standard protocol without IV contrast. RADIATION DOSE REDUCTION: This exam was performed according to the departmental dose-optimization program which includes automated exposure control, adjustment of the mA and/or kV according to patient size and/or use of iterative reconstruction technique. MEDICATIONS: None ANESTHESIA/SEDATION: None.  Patient was given Versed IV for anxiolysis. COMPLICATIONS: None immediate. PROCEDURE: Informed written consent was obtained from the patient after a thorough discussion of the procedural risks, benefits and alternatives. All questions were addressed. Maximal Sterile Barrier Technique was utilized including caps, mask, sterile gowns, sterile gloves, sterile drape, hand hygiene and skin antiseptic. A timeout was performed prior to the initiation of the procedure. A CT scan of the chest was performed. The loculated fluid was identified. A suitable skin entry site was selected and marked. The skin was sterilely prepped and draped in standard fashion using chlorhexidine skin prep. Local anesthesia was attained by infiltration with 1% lidocaine. A small dermatotomy was made. Under intermittent CT guidance, an 18 gauge trocar needle was carefully advanced over  the rib and into the fluid from an anterior approach. A 0.035 wire was then coiled in the fluid collection. The tract was dilated to 8 [place]. A 12 [place] skater drain was advanced over the wire and formed in the fluid. There was return of yellow pleural fluid. The catheter was secured in place with an 0 Prolene suture and connected to low wall suction via a pleur-evac device. An air tight bandage was applied. IMPRESSION: Successful placement of a 43 French thoracostomy tube using CT guidance via a left anterior approach. Electronically Signed   By: Malachy Moan M.D.   On: 07/05/2023 06:55   CT CHEST WO CONTRAST Result Date: 07/04/2023 CLINICAL DATA:  Empyema. Recently diagnosed with left lower lobe pneumonia  with worsening chest pain and enlarging pleural effusion. Thoracentesis performed yesterday. History of diabetes and hypertension. EXAM: CT CHEST WITHOUT CONTRAST TECHNIQUE: Multidetector CT imaging of the chest was performed following the standard protocol without IV contrast. RADIATION DOSE REDUCTION: This exam was performed according to the departmental dose-optimization program which includes automated exposure control, adjustment of the mA and/or kV according to patient size and/or use of iterative reconstruction technique. COMPARISON:  Radiographs 07/03/2023 and 06/30/2023. Chest CTA 06/30/2023. FINDINGS: Cardiovascular: No significant vascular findings on noncontrast imaging. Mild aortic and coronary artery atherosclerosis noted. The heart size is normal. There is no pericardial effusion. Mediastinum/Nodes: Stable mildly prominent subcarinal and hilar lymph nodes bilaterally, likely reactive. Small hiatal hernia.The thyroid gland and trachea appear unremarkable. Lungs/Pleura: Moderate to large left pleural effusion has significantly enlarged from the previous CT of 4 days ago and has loculated components laterally measuring approximately 13.8 x 5.5 x 11.5 cm, consistent with reported empyema. There is resulting increased compressive atelectasis of the left lower lobe and lingula. Underlying left lower lobe consolidation. No evidence of pneumothorax. Trace dependent right pleural effusion. The right lung is clear. Upper abdomen: No acute findings are seen in the visualized upper abdomen. Hepatic steatosis noted. Possible punctate nonobstructing bilateral renal calculi. Musculoskeletal/Chest wall: There is no chest wall mass or suspicious osseous finding. IMPRESSION: 1. Significant enlargement of left pleural effusion with loculated components laterally, consistent with reported empyema. This effusion is suboptimally characterized by this noncontrast study. Consider chest tube placement. 2. Resulting increased  compressive atelectasis of the left lower lobe and lingula. Underlying left lower lobe consolidation consistent with pneumonia. 3. Trace dependent right pleural effusion.  Clear right lung. 4. Stable mildly prominent mediastinal and hilar lymph nodes, likely reactive. 5. Hepatic steatosis. Possible punctate nonobstructing bilateral renal calculi. 6.  Aortic Atherosclerosis (ICD10-I70.0). Electronically Signed   By: Carey Bullocks M.D.   On: 07/04/2023 14:58   DG Chest 1 View Result Date: 07/03/2023 CLINICAL DATA:  Status post left thoracentesis. EXAM: CHEST  1 VIEW COMPARISON:  07/03/2023 at 0903 hours FINDINGS: Aeration in the left lung has mildly improved. There continues to be a large amount of consolidation or pleural fluid in the mid and lower left hemithorax. Negative for a pneumothorax. Right lung is clear. Cardiac silhouette is obscured by the densities in left lower chest. IMPRESSION: 1. Mild improvement in aeration in the left lung. Negative for a pneumothorax. 2. Large amount of consolidation or loculated pleural fluid in the left chest. Electronically Signed   By: Richarda Overlie M.D.   On: 07/03/2023 15:11   US THORACENTESIS ASP PLEURAL SPACE W/IMG GUIDE Result Date: 07/03/2023 INDICATION: Dyspnea.  Pneumonia. EXAM: ULTRASOUND GUIDED LEFT THORACENTESIS MEDICATIONS: None. COMPLICATIONS: None immediate. PROCEDURE: An ultrasound guided thoracentesis  was thoroughly discussed with the patient and questions answered. The benefits, risks, alternatives and complications were also discussed. The patient understands and wishes to proceed with the procedure. Written consent was obtained. Ultrasound was performed to localize and mark an adequate pocket of fluid in the left posterior chest. The area was then prepped and draped in the normal sterile fashion. 1% Lidocaine was used for local anesthesia. Under ultrasound guidance a 6 Fr Safe-T-Centesis catheter was introduced. Thoracentesis was performed. The catheter  was removed and a dressing applied. FINDINGS: A total of approximately 320 mL of clear yellow fluid was removed. Samples were sent to the laboratory as requested by the clinical team. Pleural catheter stopped draining despite ultrasound showing residual pleural fluid. Findings compatible with loculated pleural fluid. IMPRESSION: Successful ultrasound guided left thoracentesis yielding 320 mL of pleural fluid. Electronically Signed   By: Richarda Overlie M.D.   On: 07/03/2023 15:08    Labs:  CBC: Recent Labs    07/04/23 0510 07/05/23 0836 07/06/23 0705 07/07/23 0454  WBC 14.6* 14.0* 17.8* 20.7*  HGB 11.9* 12.2 13.1 13.8  HCT 35.6* 38.2 40.2 42.0  PLT 337 393 434* 529*    COAGS: Recent Labs    07/04/23 1342  INR 1.9*    BMP: Recent Labs    07/04/23 0510 07/05/23 0836 07/06/23 0705 07/07/23 0454  NA 133* 135 131* 125*  K 3.7 4.0 4.1 4.1  CL 99 100 95* 91*  CO2 22 22 24 22   GLUCOSE 216* 155* 173* 242*  BUN 13 14 14 14   CALCIUM 8.7* 8.8* 8.8* 8.5*  CREATININE 0.59 0.62 0.76 0.71  GFRNONAA >60 >60 >60 >60    LIVER FUNCTION TESTS: Recent Labs    06/28/23 2308  BILITOT 0.9  AST 31  ALT 34  ALKPHOS 41  PROT 8.1  ALBUMIN 4.3    Assessment and Plan:  Chest tube: of hazy serous output over last 24 hours. Drained at 3am according to pt and 50ml of output noted at bedside on exam today. No external abnormality Critical care has performed 2 pleural lytic treatment, planning for 3rd today.  Will continue to monitor chest tube and output. Abx and other medical management to be led by pulmonology, critical care, hospitalist, and other associated teams.  Electronically Signed: Katheren Puller, PA-C 07/07/2023, 10:34 AM   I spent a total of 25 Minutes at the the patient's bedside AND on the patient's hospital floor or unit, greater than 50% of which was counseling/coordinating care for left empyema/complicated pleural effusion and chest tube.

## 2023-07-07 NOTE — Progress Notes (Signed)
 NAME:  Jade Perez, MRN:  [id number], DOB:  1955/07/27, LOS: 7 ADMISSION DATE:  06/30/2023, CONSULTATION DATE:  07/04/2023 REFERRING MD:  DR Nelson Chimes, CHIEF COMPLAINT:  LLL white out    History of Present Illness:   68 year old female with past medical history of non-smoking, hypertension, hyperlipidemia, type 2 diabetes, OSA, depression, asthma not otherwise specified presented on 06/30/2023 with cough worsening dyspnea and pleuritic chest pain on the left lower lobe. PRior to that had sick resp contact with grandchild who live with her and then she got sick  CT chest showed left lower lobe consolidation with a small pleural effusion [personal visualization of the CCM MD on this consult] she was hypoxemic requiring oxygen.  She had leukocytosis with white count 20,100.  Pulmonary embolism was ruled out started on Treatment.  Subsequently urine strep and Legionella negative.  Respiratory virus panel for COVID, flu and RSV negative [20 panel not done].  Since admission has been afebrile.  Has improving leukocytosis from 20,002 currently 14,600.  Cultures remain negative.  On 07/03/2023 underwent left-sided thoracentesis removing 350 cc by interventional radiology.  This shows a simple exudate with LDH 633 and acute neutrophilic leukocytosis with polymorphs of 86%.  No eosinophils.  Despite all these interventions has remained on 4 L nasal cannula.  She has been covered by IV antibiotics with ceftriaxone and azithromycin since admission and due to and on 07/04/2023 [facility] have PCCM consult].  Of note prior to admission she was treated as outpatient with antibiotics and then failed that.   Pulmonary is now been consulted with a question of residual loculated effusion  Past Medical History:    has a past medical history of Allergic rhinitis, Asthma, mild intermittent, Complication of anesthesia, Depression, GAD (generalized anxiety disorder), GERD (gastroesophageal reflux disease), Hiatal hernia,  History of cardiomyopathy in adulthood (12/06/2021), History of heart murmur in childhood, History of kidney stones, History of non-ST elevation myocardial infarction (NSTEMI) (12/06/2021), Hyperlipidemia, mixed, Hypertension, IDA (iron deficiency anemia), OA (osteoarthritis), OSA (obstructive sleep apnea), Renal disorder, Type 2 diabetes mellitus (HCC) (2008), and Wears glasses.   reports that she has never smoked. She has never used smokeless tobacco.   Significant Hospital Events:  06/30/2023 - admit -> CT angiogram rule out pulmonary embolism.  Small left-sided pleural effusion.  Significant left lower lobe consolidation 07/03/2023: Worsening chest x-ray with significant whiteout on the left side.  S/p thoracentesis by IR draining only 320 cc.  Simple exudate -> post Thora chest x-ray without change 3/28 chest tube placed via IR 3/29 1st dose lytics after IR was able to troubleshoot tube after would not flush or aspirate  3/30 2nd dose of tube lytics 3/31 650 CT outpt; repeat 3rd dose lytics today   Interim History / Subjective:   States breathing some improved  CT in place w/ 650 plus outpt last 24 hours Resp culture w/ rare MRSA  Objective   Blood pressure 120/80, pulse 87, temperature 98 F (36.7 C), temperature source Oral, resp. rate 18, height 5\' 2"  (1.575 m), weight 90.7 kg, SpO2 94%.        Intake/Output Summary (Last 24 hours) at 07/07/2023 0945 Last data filed at 07/07/2023 0300 Gross per 24 hour  Intake 610 ml  Output 610 ml  Net 0 ml   Filed Weights   06/30/23 0046  Weight: 90.7 kg    Examination: General:  NAD; critically ill appearing on mech vent HEENT: MM pink/moist Neuro: Aox3; MAE CV: s1s2, RRR, no  m/r/g PULM:  dim clear BS bilaterally; [place]; CT in place GI: soft, bsx4 active  Extremities: warm/dry, no edema  Skin: no rashes or lesions    Resolved Hospital Problem list   N/a  Assessment & Plan:   Presumed parapneumonic effusion: Initial CT scan  with very small effusion, repeat chest x-ray in the setting of ongoing pleurisy revealed significant volume loss on the left loculated to significant atelectasis and enlarging pleural effusion also contributing.  This prompted a repeat CT scan which showed a relatively small albeit enlarged pleural effusion now significantly loculated different from before with almost total left lower lobe atelectasis on CT scan.  Again I think the majority of changes on chest x-ray related to atelectasis, there is signs of shift of trachea to the left or volume loss.  Initial thoracentesis revealed exudative effusion PMN predominant.  Chest tube placed 3/28 with appearance, development of loculated enlarging pleural effusion. LLL PNA: resp culture w/ MRSA Plan: -repeat 3rd dose tube lytics today -cont CT to suction -monitor CT outpt -CXR in am -cont linezolid per primary -pulm toiletry -mobilize as able: PT    Best practice (daily eval):   Per primary      JD Anselm [facility] 07/07/2023, 10:22 AM  Please see [email or web address] for pager details.  From 7A-7P if no response, please call [phone]. After hours, please call ELink [phone].

## 2023-07-07 NOTE — Progress Notes (Signed)
 Occupational Therapy Treatment Patient Details Name: Jade Perez MRN: [id number] DOB: 1955/12/05 Today's Date: 07/07/2023   History of present illness Patient is a 68 year old female admitted with Pna. Hx of DM, OSA, depression, asthma, NSTEMI, RCR   OT comments  Patient was educated on trying tasks prior to asking husband to complete for her when patient was able to complete donning socks bilaterally with no A needed. Patient and husband verbalized understanding. Patient was limited by fatigue and pain in bed today. Patient would continue to benefit from skilled OT services at this time while admitted  to address noted deficits in order to improve overall safety and independence in ADLs.        If plan is discharge home, recommend the following:  A little help with bathing/dressing/bathroom;Assistance with cooking/housework;Assist for transportation   Equipment Recommendations  Tub/shower seat       Precautions / Restrictions Precautions Precautions: Fall Precaution/Restrictions Comments: monitor O2 Restrictions Weight Bearing Restrictions Per Provider Order: No       Mobility Bed Mobility Overal bed mobility: Needs Assistance Bed Mobility: Supine to Sit, Sit to Supine     Supine to sit: Supervision, HOB elevated, Used rails Sit to supine: Supervision, HOB elevated, Used rails   General bed mobility comments: supv with therapists managing lines for safety, pt managing supine<>sit with bedrail and HOB elevated, increased time and effort           Balance Overall balance assessment: Needs assistance   Sitting balance-Leahy Scale: Good     Standing balance support: No upper extremity supported, During functional activity Standing balance-Leahy Scale: Fair       ADL either performed or assessed with clinical judgement   ADL Overall ADL's : Needs assistance/impaired       Grooming Details (indicate cue type and reason): patient reported she had completed  these tasks already today.               Lower Body Dressing Details (indicate cue type and reason): patient was able to figure four BLE to don socks at bed level with husband attempting to complete for patient. patient and husband were educated on importance of finding out where patient really needs help to prevent learned helplessness and caregiver burn out. patient and husband verbalized understanding. Toilet Transfer: Ambulation;Rolling walker (2 wheels);Contact guard assist Toilet Transfer Details (indicate cue type and reason): patient was unsteady with movements. patient leaning back on bed with BLE to maintain balance. patient did have one loss of balance with min A to maintin standing during sesion.                 Communication Communication Communication: No apparent difficulties   Cognition Arousal: Alert Behavior During Therapy: WFL for tasks assessed/performed Cognition: No apparent impairments                            Pertinent Vitals/ Pain       Pain Assessment Pain Assessment: Faces Faces Pain Scale: Hurts little more Pain Location: chest tube site Pain Descriptors / Indicators: Aching, Sore Pain Intervention(s): Limited activity within patient's tolerance, Monitored during session, Repositioned         Frequency  Min 1X/week               Co-evaluation      Reason for Co-Treatment: Complexity of the patient's impairments (multi-system involvement);To address functional/ADL transfers PT goals addressed during session: Mobility/safety with  mobility        AM-PAC OT "6 Clicks" Daily Activity     Outcome Measure   Help from another person eating meals?: None Help from another person taking care of personal grooming?: A Little Help from another person toileting, which includes using toliet, bedpan, or urinal?: A Little Help from another person bathing (including washing, rinsing, drying)?: A Little Help from another person to  put on and taking off regular upper body clothing?: A Little Help from another person to put on and taking off regular lower body clothing?: A Little 6 Click Score: 19    End of Session Equipment Utilized During Treatment: Gait belt;Rolling walker (2 wheels);Oxygen  OT Visit Diagnosis: Muscle weakness (generalized) (M62.81);Other abnormalities of gait and mobility (R26.89)   Activity Tolerance Patient limited by fatigue   Patient Left with call bell/phone within reach;with family/visitor present;in bed   Nurse Communication Mobility status        Time: 1610-9604 OT Time Calculation (min): 22 min  Charges: OT General Charges $OT Visit: 1 Visit  Rosalio Loud, MS Acute Rehabilitation Department Office# [phone]   Selinda Flavin 07/07/2023, 1:51 PM

## 2023-07-07 NOTE — Progress Notes (Signed)
 PROGRESS NOTE    Jade Perez  WJX:[id number] DOB: Jan 14, 1956 DOA: 06/30/2023 PCP: Abner Greenspan, MD    Brief Narrative:  68 year old with history of HTN, HLD, DM2, OSA, depression, asthma, NSTEMI with normal coronaries admitted for shortness of breath and cough.  Outpatient diagnosed of left lower lobe pneumonia on 3/22 but returns due to worsening of her pain and discomfort.  CTA chest negative for PE but does show left lower lobe consolidation started on Rocephin and azithromycin.  Unfortunately chest x-ray showed worsening of left-sided pleural effusion, thoracentesis on 3/27 yielded 220 cc with due to persistence of this effusion, CT was performed which showed concerns of loculation therefore chest tube placed 3/28.  Assessment & Plan:  Principal Problem:   CAP (community acquired pneumonia) Active Problems:   Intrinsic asthma   Type 2 diabetes mellitus without complication, without long-term current use of insulin (HCC)   Acute respiratory failure with hypoxia (HCC)   Hypokalemia   Hypertension   Depression   OSA (obstructive sleep apnea)   Hyperlipidemia, mixed    Left lower lobe community-acquired pneumonia; mrsa Left-sided pleural effusion, worsening Failed outpatient treatment.  During hospitalization also received IV Rocephin and azithromycin without any improvement.  Initial CTA showed left lower lobe consolidation but later repeat x-ray showed worsening of pleural effusion requiring thoracentesis on 3/27 yielding 3 and 20 cc without much improvement.  Repeat CT chest showed persistent of effusion with loculation therefore chest tube placed on 3/28.  Pulmonary team is following, has received couple of doses of lytics.  Continue monitor chest tube output.  Now on linezolid due to MRSA and respiratory cultures -Continues to have good amount of chest tube output  Hypokalemia/hypophosphatemia Replete as needed  Hyponatremia - Suspect from hypovolemia.  Will check urine  studies and osmolality Order 500cc BS bolus.    Essential hypertension Norvasc.  IV as needed   Hyperlipidemia Statin   Diabetes mellitus type 2; uncontrolled due to hyperglycmia.  - A1c was 6.5% in September 2023  -Holding home Ozempic.  Continue Amaryl.  Adjust long-acting and Premeal as necessary Sliding scale and Accu-Cheks   History of asthma and OSA Bronchodilators   Depression - Celexa    Smudge cells - Smudge cells noted on peripheral smear, percentage not specified  - Could be d/t the infection but she will need further workup with flow cytometry if leukocytosis persists   PT/OT-no follow-up needed Ambulatory pulse ox  DVT prophylaxis: Lovenox    Code Status: Full Code Family Communication: Family at bedside Status is: Inpatient Remains inpatient appropriate because: Continue hospital side for abnormal breath sounds.  Chest tube management by pulmonary   Subjective: Seen at bedside.  Minimal use of incentive spirometer over last 24 hours.  Tells me her urine is dark as well but overall breathing standpoint she is feeling better  Examination:  General exam: Appears calm and comfortable  Respiratory system: Left side diminished breath sounds, better air movement Cardiovascular system: S1 & S2 heard, RRR. No JVD, murmurs, rubs, gallops or clicks. No pedal edema. Gastrointestinal system: Abdomen is nondistended, soft and nontender. No organomegaly or masses felt. Normal bowel sounds heard. Central nervous system: Alert and oriented. No focal neurological deficits. Extremities: Symmetric 5 x 5 power. Skin: Blood clot around chest tube insertion site s Psychiatry: Judgement and insight appear normal. Mood & affect appropriate. Left-sided chest tube               Diet Orders (From admission, onward)  Start     Ordered   07/04/23 1738  Diet regular Room service appropriate? Yes; Fluid consistency: Thin  Diet effective now       Question Answer  Comment  Room service appropriate? Yes   Fluid consistency: Thin      07/04/23 1737            Objective: Vitals:   07/06/23 2017 07/06/23 2029 07/07/23 0546 07/07/23 0829  BP: (!) 157/89  120/80   Pulse: 86 92 87   Resp: 20 20 18    Temp: 97.9 F (36.6 C)  98 F (36.7 C)   TempSrc: Oral  Oral   SpO2: 94% 95% 94% 94%  Weight:      Height:        Intake/Output Summary (Last 24 hours) at 07/07/2023 1155 Last data filed at 07/07/2023 0300 Gross per 24 hour  Intake 600 ml  Output 610 ml  Net -10 ml   Filed Weights   06/30/23 0046  Weight: 90.7 kg    Scheduled Meds:  amLODipine  5 mg Oral Daily   arformoterol  15 mcg Nebulization BID   atorvastatin  40 mg Oral QHS   citalopram  20 mg Oral Daily   enoxaparin (LOVENOX) injection  40 mg Subcutaneous Q24H   fenofibrate  160 mg Oral Daily   ferrous sulfate  325 mg Oral Q breakfast   glimepiride  0.5 mg Oral BID   guaiFENesin  10 mL Oral Q6H   insulin aspart  0-5 Units Subcutaneous QHS   insulin aspart  0-6 Units Subcutaneous TID WC   insulin aspart  3 Units Subcutaneous TID WC   insulin glargine-yfgn  14 Units Subcutaneous Daily   lidocaine  2 patch Transdermal Q24H   montelukast  10 mg Oral QHS   pantoprazole  40 mg Oral Daily   revefenacin  175 mcg Nebulization Daily   sodium chloride flush  10 mL Intrapleural Q8H   sodium chloride flush  3 mL Intravenous Q12H   Continuous Infusions:  linezolid (ZYVOX) IV 600 mg (07/07/23 1019)    Nutritional status     Body mass index is 36.58 kg/m.  Data Reviewed:   CBC: Recent Labs  Lab 07/03/23 0540 07/04/23 0510 07/05/23 0836 07/06/23 0705 07/07/23 0454  WBC 15.6* 14.6* 14.0* 17.8* 20.7*  HGB 12.5 11.9* 12.2 13.1 13.8  HCT 37.7 35.6* 38.2 40.2 42.0  MCV 90.8 91.0 92.7 91.6 90.3  PLT 335 337 393 434* 529*   Basic Metabolic Panel: Recent Labs  Lab 07/01/23 0550 07/02/23 0556 07/03/23 0540 07/04/23 0510 07/05/23 0836 07/06/23 0705 07/07/23 0454   NA 131*   < > 133* 133* 135 131* 125*  K 3.4*   < > 3.4* 3.7 4.0 4.1 4.1  CL 97*   < > 97* 99 100 95* 91*  CO2 22   < > 23 22 22 24 22   GLUCOSE 126*   < > 186* 216* 155* 173* 242*  BUN 18   < > 13 13 14 14 14   CREATININE 0.78   < > 0.64 0.59 0.62 0.76 0.71  CALCIUM 8.7*   < > 8.8* 8.7* 8.8* 8.8* 8.5*  MG 2.2   < > 1.7 1.7 1.7 1.8 1.8  PHOS 1.9*  --   --   --   --   --   --    < > = values in this interval not displayed.   GFR: Estimated Creatinine Clearance: 69.5 mL/min (by C-G  formula based on SCr of 0.71 mg/dL). Liver Function Tests: No results for input(s): "AST", "ALT", "ALKPHOS", "BILITOT", "PROT", "ALBUMIN" in the last 168 hours. No results for input(s): "LIPASE", "AMYLASE" in the last 168 hours. No results for input(s): "AMMONIA" in the last 168 hours. Coagulation Profile: Recent Labs  Lab 07/04/23 1342  INR 1.9*   Cardiac Enzymes: No results for input(s): "CKTOTAL", "CKMB", "CKMBINDEX", "TROPONINI" in the last 168 hours. BNP (last 3 results) No results for input(s): "PROBNP" in the last 8760 hours. HbA1C: No results for input(s): "HGBA1C" in the last 72 hours. CBG: Recent Labs  Lab 07/06/23 1150 07/06/23 1650 07/06/23 2109 07/07/23 0739 07/07/23 1137  GLUCAP 249* 284* 250* 247* 311*   Lipid Profile: No results for input(s): "CHOL", "HDL", "LDLCALC", "TRIG", "CHOLHDL", "LDLDIRECT" in the last 72 hours. Thyroid Function Tests: No results for input(s): "TSH", "T4TOTAL", "FREET4", "T3FREE", "THYROIDAB" in the last 72 hours. Anemia Panel: No results for input(s): "VITAMINB12", "FOLATE", "FERRITIN", "TIBC", "IRON", "RETICCTPCT" in the last 72 hours. Sepsis Labs: Recent Labs  Lab 07/05/23 0836  PROCALCITON 0.78    Recent Results (from the past 240 hours)  Resp panel by RT-PCR (RSV, Flu A&B, Covid) Anterior Nasal Swab     Status: None   Collection Time: 06/28/23  7:16 PM   Specimen: Anterior Nasal Swab  Result Value Ref Range Status   SARS Coronavirus 2 by  RT PCR NEGATIVE NEGATIVE Final    Comment: (NOTE) SARS-CoV-2 target nucleic acids are NOT DETECTED.  The SARS-CoV-2 RNA is generally detectable in upper respiratory specimens during the acute phase of infection. The lowest concentration of SARS-CoV-2 viral copies this assay can detect is 138 copies/mL. A negative result does not preclude SARS-Cov-2 infection and should not be used as the sole basis for treatment or other patient management decisions. A negative result may occur with  improper specimen collection/handling, submission of specimen other than nasopharyngeal swab, presence of viral mutation(s) within the areas targeted by this assay, and inadequate number of viral copies(<138 copies/mL). A negative result must be combined with clinical observations, patient history, and epidemiological information. The expected result is Negative.  Fact Sheet for Patients:  BloggerCourse.com  Fact Sheet for Healthcare Providers:  SeriousBroker.it  This test is no t yet approved or cleared by the [place] FDA and  has been authorized for detection and/or diagnosis of SARS-CoV-2 by FDA under an Emergency Use Authorization (EUA). This EUA will remain  in effect (meaning this test can be used) for the duration of the COVID-19 declaration under Section 564(b)(1) of the Act, 21 U.S.C.section 360bbb-3(b)(1), unless the authorization is terminated  or revoked sooner.       Influenza A by PCR NEGATIVE NEGATIVE Final   Influenza B by PCR NEGATIVE NEGATIVE Final    Comment: (NOTE) The Xpert Xpress SARS-CoV-2/FLU/RSV plus assay is intended as an aid in the diagnosis of influenza from Nasopharyngeal swab specimens and should not be used as a sole basis for treatment. Nasal washings and aspirates are unacceptable for Xpert Xpress SARS-CoV-2/FLU/RSV testing.  Fact Sheet for Patients: BloggerCourse.com  Fact Sheet  for Healthcare Providers: SeriousBroker.it  This test is not yet approved or cleared by the [place] FDA and has been authorized for detection and/or diagnosis of SARS-CoV-2 by FDA under an Emergency Use Authorization (EUA). This EUA will remain in effect (meaning this test can be used) for the duration of the COVID-19 declaration under Section 564(b)(1) of the Act, 21 U.S.C. section 360bbb-3(b)(1), unless the authorization  is terminated or revoked.     Resp Syncytial Virus by PCR NEGATIVE NEGATIVE Final    Comment: (NOTE) Fact Sheet for Patients: BloggerCourse.com  Fact Sheet for Healthcare Providers: SeriousBroker.it  This test is not yet approved or cleared by the [place] FDA and has been authorized for detection and/or diagnosis of SARS-CoV-2 by FDA under an Emergency Use Authorization (EUA). This EUA will remain in effect (meaning this test can be used) for the duration of the COVID-19 declaration under Section 564(b)(1) of the Act, 21 U.S.C. section 360bbb-3(b)(1), unless the authorization is terminated or revoked.  Performed at [facility], [address] [place], [place] [address]   Blood culture (routine x 2)     Status: None   Collection Time: 06/30/23  1:03 AM   Specimen: BLOOD  Result Value Ref Range Status   Specimen Description   Final    BLOOD LEFT ANTECUBITAL Performed at [facility], [address] [place], [place] [address]    Special Requests   Final    BOTTLES DRAWN AEROBIC AND ANAEROBIC Blood Culture adequate volume Performed at [facility], [address] [place], [place] [address]    Culture   Final    NO GROWTH 5 DAYS Performed at [facility], [address], [place], [place] [address]    Report Status 07/05/2023 FINAL  Final  Blood culture (routine x 2)     Status: None   Collection Time: 06/30/23  1:03 AM    Specimen: BLOOD RIGHT FOREARM  Result Value Ref Range Status   Specimen Description   Final    BLOOD RIGHT FOREARM Performed at [facility], [address] [place], [place] [address]    Special Requests   Final    BOTTLES DRAWN AEROBIC AND ANAEROBIC Blood Culture adequate volume Performed at [facility], [address] [place], [place] [address]    Culture   Final    NO GROWTH 5 DAYS Performed at [facility], [address], [place], [place] [address]    Report Status 07/05/2023 FINAL  Final  Expectorated Sputum Assessment w Gram Stain, Rflx to Resp Cult     Status: None   Collection Time: 07/02/23  4:43 PM   Specimen: Expectorated Sputum  Result Value Ref Range Status   Specimen Description EXPECTORATED SPUTUM  Final   Special Requests NONE  Final   Sputum evaluation   Final    THIS SPECIMEN IS ACCEPTABLE FOR SPUTUM CULTURE Performed at [facility], [address] [place], [place] [address]    Report Status 07/02/2023 FINAL  Final  Culture, Respiratory w Gram Stain     Status: None   Collection Time: 07/02/23  4:43 PM  Result Value Ref Range Status   Specimen Description   Final    EXPECTORATED SPUTUM Performed at [facility], [address] [place], [place] [address]    Special Requests   Final    NONE Reflexed from [id number] Performed at [facility], [address], [place], [place] [address]    Gram Stain   Final    FEW WBC PRESENT, PREDOMINANTLY PMN FEW GRAM POSITIVE COCCI RARE YEAST WITH PSEUDOHYPHAE RARE GRAM NEGATIVE RODS Performed at [facility], [address] [place], [place] [address]    Culture   Final    RARE METHICILLIN RESISTANT STAPHYLOCOCCUS AUREUS RARE  CANDIDA ALBICANS    Report Status 07/06/2023 FINAL  Final   Organism ID, Bacteria METHICILLIN RESISTANT STAPHYLOCOCCUS AUREUS  Final      Susceptibility   Methicillin resistant staphylococcus aureus -  MIC*    CIPROFLOXACIN >=8 RESISTANT Resistant     ERYTHROMYCIN >=8 RESISTANT Resistant     GENTAMICIN <=0.5 SENSITIVE Sensitive     OXACILLIN >=4 RESISTANT Resistant     TETRACYCLINE <=1 SENSITIVE Sensitive     VANCOMYCIN <=0.5 SENSITIVE Sensitive     TRIMETH/SULFA >=320 RESISTANT Resistant     CLINDAMYCIN >=8 RESISTANT Resistant     RIFAMPIN <=0.5 SENSITIVE Sensitive     Inducible Clindamycin NEGATIVE Sensitive     LINEZOLID 2 SENSITIVE Sensitive     * RARE METHICILLIN RESISTANT STAPHYLOCOCCUS AUREUS  Fungus Culture With Stain     Status: None (Preliminary result)   Collection Time: 07/03/23  2:24 PM   Specimen: PATH Cytology Pleural fluid  Result Value Ref Range Status   Fungus Stain Final report  Final    Comment: (NOTE) Performed At: [facility] [address] [place], [place] [id number] Jolene Schimke MD VH:[id number]    Fungus (Mycology) Culture PENDING  Incomplete   Fungal Source PLEURAL  Final    Comment: Performed at [facility], [address], [place], [place] [address]  Culture, Fungus without Smear     Status: None (Preliminary result)   Collection Time: 07/03/23  2:24 PM   Specimen: PATH Cytology Pleural fluid  Result Value Ref Range Status   Specimen Description   Final    PLEURAL Performed at [facility], [address] [place], [place] [address]    Special Requests   Final    NONE Performed at [facility], [address], [place], [place] [address]    Culture   Final    NO FUNGUS ISOLATED AFTER 3 DAYS Performed at [facility], [address] [place], [place] [address]    Report Status PENDING  Incomplete  Body fluid culture w Gram Stain     Status: None   Collection Time: 07/03/23  2:24 PM   Specimen: PATH Cytology Pleural fluid  Result Value Ref Range Status   Specimen Description   Final    PLEURAL Performed at [facility], [address],  [place], [place] [address]    Special Requests   Final    NONE Performed at [facility], [address], [place], [place] [address]    Gram Stain NO WBC SEEN NO ORGANISMS SEEN   Final   Culture   Final    NO GROWTH 3 DAYS Performed at [facility], [address] [place], [place] [address]    Report Status 07/06/2023 FINAL  Final  Fungus Culture Result     Status: None   Collection Time: 07/03/23  2:24 PM  Result Value Ref Range Status   Result 1 Comment  Final    Comment: (NOTE) KOH/Calcofluor preparation:  no fungus observed. Performed At: [facility] [address] [place], [place] [id number] Jolene Schimke MD YS:[id number]   Respiratory (~20 pathogens) panel by PCR     Status: None   Collection Time: 07/04/23 10:04 AM   Specimen: Nasopharyngeal Swab; Respiratory  Result Value Ref Range Status   Adenovirus NOT DETECTED NOT DETECTED Final   Coronavirus 229E NOT DETECTED NOT DETECTED Final    Comment: (NOTE) The Coronavirus on the Respiratory Panel,  DOES NOT test for the novel  Coronavirus (2019 nCoV)    Coronavirus HKU1 NOT DETECTED NOT DETECTED Final   Coronavirus NL63 NOT DETECTED NOT DETECTED Final   Coronavirus OC43 NOT DETECTED NOT DETECTED Final   Metapneumovirus NOT DETECTED NOT DETECTED Final   Rhinovirus / Enterovirus NOT DETECTED NOT DETECTED Final   Influenza A NOT DETECTED NOT DETECTED Final   Influenza B NOT DETECTED NOT DETECTED Final   Parainfluenza Virus 1 NOT DETECTED NOT DETECTED Final   Parainfluenza Virus 2 NOT DETECTED NOT DETECTED Final   Parainfluenza Virus 3 NOT DETECTED NOT DETECTED Final   Parainfluenza Virus 4 NOT DETECTED NOT DETECTED Final   Respiratory Syncytial Virus NOT DETECTED NOT DETECTED Final   Bordetella pertussis NOT DETECTED NOT DETECTED Final   Bordetella Parapertussis NOT DETECTED NOT DETECTED Final   Chlamydophila pneumoniae NOT DETECTED NOT DETECTED Final   Mycoplasma pneumoniae NOT DETECTED  NOT DETECTED Final    Comment: Performed at [facility], [address], [place], [place] [address]         Radiology Studies: DG Chest Port 1 View Result Date: 07/07/2023 CLINICAL DATA:  Pleural effusion. EXAM: PORTABLE CHEST 1 VIEW COMPARISON:  X-ray 07/06/2023 and older FINDINGS: Stable left-sided pigtail catheter. Decreasing left effusion with significant residual. Associated parenchymal opacity. Decreasing right basilar atelectasis. No pneumothorax or edema. Stable cardiopericardial silhouette. Film is under penetrated. Degenerative changes along the spine IMPRESSION: Decreasing left pleural effusion with significant residual. No pneumothorax Electronically Signed   By: Karen Kays M.D.   On: 07/07/2023 11:14   DG CHEST PORT 1 VIEW Result Date: 07/06/2023 CLINICAL DATA:  [id number] Chest tube in place [id number] EXAM: PORTABLE CHEST 1 VIEW COMPARISON:  07/05/2023 FINDINGS: Left chest tube remains in place. Large left-sided pleural effusion has increased. Associated left basilar opacity. Band-like atelectasis at the right lung base. No pneumothorax. Stable heart size. IMPRESSION: Large left-sided pleural effusion has increased. Left chest tube remains in place. Electronically Signed   By: Duanne Guess D.O.   On: 07/06/2023 10:14           LOS: 7 days   Time spent= 35 mins    Miguel Rota, MD [facility]  If 7PM-7AM, please contact night-coverage  07/07/2023, 11:55 AM

## 2023-07-07 NOTE — Progress Notes (Signed)
 Physical Therapy Treatment Patient Details Name: Jade Perez MRN: [id number] DOB: 09-06-1955 Today's Date: 07/07/2023   History of Present Illness 68 yo female admitted with Pna. Hx of DM, OSA, depression, asthma, NSTEMI, RCR    PT Comments  Pt reports fatigued from morning activities and ongoing nausea, but agreeable to attempt therapy. Pt needing supv with supine<>sit, therapists managing lines for safety, pt using bedrial as needed and elevated HOB. Pt completes STS reps from EOB and static marching, improved steadiness when using RW versus HHA. Pt with decreased activity tolerance, returns to supine with therapists managing lines for safety and RN in room with medication at EOS.    If plan is discharge home, recommend the following: A little help with walking and/or transfers;A little help with bathing/dressing/bathroom;Assistance with cooking/housework;Assist for transportation;Help with stairs or ramp for entrance   Can travel by private vehicle        Equipment Recommendations  None recommended by PT    Recommendations for Other Services       Precautions / Restrictions Precautions Precautions: Fall Restrictions Weight Bearing Restrictions Per Provider Order: No     Mobility  Bed Mobility Overal bed mobility: Needs Assistance Bed Mobility: Supine to Sit, Sit to Supine     Supine to sit: Supervision, HOB elevated, Used rails Sit to supine: Supervision, HOB elevated, Used rails   General bed mobility comments: supv with therapists managing lines for safety, pt managing supine<>sit with bedrail and HOB elevated, increased time and effort    Transfers Overall transfer level: Needs assistance Equipment used: Rolling walker (2 wheels), 1 person hand held assist Transfers: Sit to/from Stand Sit to Stand: Contact guard assist, Supervision           General transfer comment: CGA for STS with HHA, BLE braced against bed to steady self, slightly unsteady  repositioning feet and holding tightly to therapist's hand so provided RW; verbal cues for hand placement with RW STS reps, prefers BLE braced against the bed    Ambulation/Gait               General Gait Details: pt engaging in static marching with HHA and with RW, improved steadiness wtih RW support, fatigues easily requiring seated rest break   Stairs             Wheelchair Mobility     Tilt Bed    Modified Rankin (Stroke Patients Only)       Balance Overall balance assessment: Needs assistance Sitting-balance support: No upper extremity supported, Feet supported Sitting balance-Leahy Scale: Good     Standing balance support: No upper extremity supported, During functional activity Standing balance-Leahy Scale: Fair                              Hotel manager: No apparent difficulties  Cognition Arousal: Alert Behavior During Therapy: WFL for tasks assessed/performed   PT - Cognitive impairments: No apparent impairments                         Following commands: Intact      Cueing Cueing Techniques: Verbal cues  Exercises Other Exercises Other Exercises: STS from EOB with RW x3 reps    General Comments        Pertinent Vitals/Pain Pain Assessment Pain Assessment: Faces Faces Pain Scale: Hurts little more Pain Location: chest tube site Pain Descriptors / Indicators: Aching, Sore Pain  Intervention(s): Limited activity within patient's tolerance, Monitored during session, Repositioned    Home Living                          Prior Function            PT Goals (current goals can now be found in the care plan section) Acute Rehab PT Goals Patient Stated Goal: to get better and regain PLOF Time For Goal Achievement: 07/15/23 Potential to Achieve Goals: Good Progress towards PT goals: Progressing toward goals    Frequency    Min 1X/week      PT Plan       Co-evaluation PT/OT/SLP Co-Evaluation/Treatment: Yes Reason for Co-Treatment: Complexity of the patient's impairments (multi-system involvement);To address functional/ADL transfers PT goals addressed during session: Mobility/safety with mobility        AM-PAC PT "6 Clicks" Mobility   Outcome Measure  Help needed turning from your back to your side while in a flat bed without using bedrails?: None Help needed moving from lying on your back to sitting on the side of a flat bed without using bedrails?: None Help needed moving to and from a bed to a chair (including a wheelchair)?: A Little Help needed standing up from a chair using your arms (e.g., wheelchair or bedside chair)?: A Little Help needed to walk in hospital room?: A Little Help needed climbing 3-5 steps with a railing? : A Little 6 Click Score: 20    End of Session Equipment Utilized During Treatment: Gait belt;Oxygen Activity Tolerance: Patient limited by fatigue Patient left: in bed;with call bell/phone within reach;with nursing/sitter in room;with family/visitor present Nurse Communication: Mobility status PT Visit Diagnosis: Unsteadiness on feet (R26.81);Muscle weakness (generalized) (M62.81)     Time: 1610-9604 PT Time Calculation (min) (ACUTE ONLY): 22 min  Charges:    $Therapeutic Activity: 8-22 mins PT General Charges $$ ACUTE PT VISIT: 1 Visit                      Tori Nahome Bublitz PT, DPT 07/07/23, 11:50 AM

## 2023-07-07 NOTE — Procedures (Signed)
 Pleural Fibrinolytic Administration Procedure Note  Jade Perez  [id number]  [birth year]  Date:07/07/23  Time:12:11 PM   Provider Performing:Kristinia Leavy D Suzie Portela   Procedure: Pleural Fibrinolysis Subsequent day [phone])  Indication(s) Fibrinolysis of complicated pleural effusion  Consent Risks of the procedure as well as the alternatives and risks of each were explained to the patient and/or caregiver.  Consent for the procedure was obtained.   Anesthesia None   Time Out Verified patient identification, verified procedure, site/side was marked, verified correct patient position, special equipment/implants available, medications/allergies/relevant history reviewed, required imaging and test results available.   Sterile Technique Hand hygiene, gloves   Procedure Description Existing pleural catheter was cleaned and accessed in sterile manner.  10mg  of tPA in 30cc of saline and 5mg  of dornase in 30cc of sterile water were injected into pleural space using existing pleural catheter.  Catheter will be clamped for 1 hour and then placed back to suction.   Complications/Tolerance None; patient tolerated the procedure well.   EBL None   Specimen(s) None  JD [facility] 07/07/2023, 12:11 PM  Please see [email or web address] for pager details.  From 7A-7P if no response, please call [phone]. After hours, please call ELink [phone].

## 2023-07-07 NOTE — Progress Notes (Addendum)
   07/07/23 2005  BiPAP/CPAP/SIPAP  BiPAP/CPAP/SIPAP Pt Type Adult  Reason BIPAP/CPAP not in use Non-compliant   Pt refused cpap tonight.  Pt prefers to wear nasal cannula instead, currently on 3lpm.  Cpap machine remains in room.  Pt encouraged to call should she change her mind.  RN aware.

## 2023-07-08 ENCOUNTER — Inpatient Hospital Stay (HOSPITAL_COMMUNITY)

## 2023-07-08 DIAGNOSIS — E1169 Type 2 diabetes mellitus with other specified complication: Secondary | ICD-10-CM | POA: Diagnosis not present

## 2023-07-08 DIAGNOSIS — J189 Pneumonia, unspecified organism: Secondary | ICD-10-CM | POA: Diagnosis not present

## 2023-07-08 DIAGNOSIS — Z6835 Body mass index (BMI) 35.0-35.9, adult: Secondary | ICD-10-CM | POA: Diagnosis not present

## 2023-07-08 LAB — GLUCOSE, CAPILLARY
Glucose-Capillary: 208 mg/dL — ABNORMAL HIGH (ref 70–99)
Glucose-Capillary: 278 mg/dL — ABNORMAL HIGH (ref 70–99)
Glucose-Capillary: 210 mg/dL — ABNORMAL HIGH (ref 70–99)
Glucose-Capillary: 226 mg/dL — ABNORMAL HIGH (ref 70–99)

## 2023-07-08 LAB — BASIC METABOLIC PANEL WITH GFR
Anion gap: 10 (ref 5–15)
BUN: 13 mg/dL (ref 8–23)
CO2: 26 mmol/L (ref 22–32)
Calcium: 8.4 mg/dL — ABNORMAL LOW (ref 8.9–10.3)
Chloride: 94 mmol/L — ABNORMAL LOW (ref 98–111)
Creatinine, Ser: 0.73 mg/dL (ref 0.44–1.00)
GFR, Estimated: >60 mL/min (ref >60)
Glucose, Bld: 249 mg/dL — ABNORMAL HIGH (ref 70–99)
Potassium: 4.3 mmol/L (ref 3.5–5.1)
Sodium: 130 mmol/L — ABNORMAL LOW (ref 135–145)

## 2023-07-08 LAB — CYTOLOGY - NON PAP

## 2023-07-08 LAB — BRAIN NATRIURETIC PEPTIDE: B Natriuretic Peptide: 51.2 pg/mL (ref 0.0–100.0)

## 2023-07-08 NOTE — Plan of Care (Signed)
  Problem: Education: Goal: Knowledge of General Education information will improve Description: Including pain rating scale, medication(s)/side effects and non-pharmacologic comfort measures Outcome: Progressing   Problem: Clinical Measurements: Goal: Ability to maintain clinical measurements within normal limits will improve Outcome: Progressing Goal: Diagnostic test results will improve Outcome: Progressing Goal: Respiratory complications will improve Outcome: Progressing   Problem: Activity: Goal: Risk for activity intolerance will decrease Outcome: Progressing   Problem: Nutrition: Goal: Adequate nutrition will be maintained Outcome: Progressing   Problem: Pain Managment: Goal: General experience of comfort will improve and/or be controlled Outcome: Progressing   Problem: Education: Goal: Ability to describe self-care measures that may prevent or decrease complications (Diabetes Survival Skills Education) will improve Outcome: Progressing   Problem: Metabolic: Goal: Ability to maintain appropriate glucose levels will improve Outcome: Progressing

## 2023-07-08 NOTE — Progress Notes (Signed)
 NAME:  Jade Perez, MRN:  [id number], DOB:  1955/07/02, LOS: 8 ADMISSION DATE:  06/30/2023, CONSULTATION DATE:  07/04/2023 REFERRING MD:  DR Nelson Chimes, CHIEF COMPLAINT:  LLL white out    History of Present Illness:   68 year old female with past medical history of non-smoking, hypertension, hyperlipidemia, type 2 diabetes, OSA, depression, asthma not otherwise specified presented on 06/30/2023 with cough worsening dyspnea and pleuritic chest pain on the left lower lobe. PRior to that had sick resp contact with grandchild who live with her and then she got sick  CT chest showed left lower lobe consolidation with a small pleural effusion [personal visualization of the CCM MD on this consult] she was hypoxemic requiring oxygen.  She had leukocytosis with white count 20,100.  Pulmonary embolism was ruled out started on Treatment.  Subsequently urine strep and Legionella negative.  Respiratory virus panel for COVID, flu and RSV negative [20 panel not done].  Since admission has been afebrile.  Has improving leukocytosis from 20,002 currently 14,600.  Cultures remain negative.  On 07/03/2023 underwent left-sided thoracentesis removing 350 cc by interventional radiology.  This shows a simple exudate with LDH 633 and acute neutrophilic leukocytosis with polymorphs of 86%.  No eosinophils.  Despite all these interventions has remained on 4 L nasal cannula.  She has been covered by IV antibiotics with ceftriaxone and azithromycin since admission and due to and on 07/04/2023 [facility] have PCCM consult].  Of note prior to admission she was treated as outpatient with antibiotics and then failed that.   Pulmonary is now been consulted with a question of residual loculated effusion  Past Medical History:    has a past medical history of Allergic rhinitis, Asthma, mild intermittent, Complication of anesthesia, Depression, GAD (generalized anxiety disorder), GERD (gastroesophageal reflux disease), Hiatal hernia,  History of cardiomyopathy in adulthood (12/06/2021), History of heart murmur in childhood, History of kidney stones, History of non-ST elevation myocardial infarction (NSTEMI) (12/06/2021), Hyperlipidemia, mixed, Hypertension, IDA (iron deficiency anemia), OA (osteoarthritis), OSA (obstructive sleep apnea), Renal disorder, Type 2 diabetes mellitus (HCC) (2008), and Wears glasses.   reports that she has never smoked. She has never used smokeless tobacco.   Significant Hospital Events:  06/30/2023 - admit -> CT angiogram rule out pulmonary embolism.  Small left-sided pleural effusion.  Significant left lower lobe consolidation 07/03/2023: Worsening chest x-ray with significant whiteout on the left side.  S/p thoracentesis by IR draining only 320 cc.  Simple exudate -> post Thora chest x-ray without change 3/28 chest tube placed via IR 3/29 1st dose lytics after IR was able to troubleshoot tube after would not flush or aspirate  3/30 2nd dose of tube lytics 3/31 650 CT outpt; repeat 3rd dose lytics today   Interim History / Subjective:  Repeat tube lytics yesterday 200 cc outpt last 24 hours CXR improving left pleural effusion but still present Patient on 3L [place]   Objective   Blood pressure 122/69, pulse 73, temperature 98 F (36.7 C), temperature source Oral, resp. rate 20, height 5\' 2"  (1.575 m), weight 90.7 kg, SpO2 97%.        Intake/Output Summary (Last 24 hours) at 07/08/2023 1036 Last data filed at 07/08/2023 0600 Gross per 24 hour  Intake 920 ml  Output 650 ml  Net 270 ml   Filed Weights   06/30/23 0046  Weight: 90.7 kg    Examination: General:  NAD; critically ill appearing on mech vent HEENT: MM pink/moist Neuro: Aox3; MAE CV: s1s2,  RRR, no m/r/g PULM:  dim clear BS bilaterally; [place]; CT in place GI: soft, bsx4 active  Extremities: warm/dry, no edema  Skin: no rashes or lesions    Resolved Hospital Problem list   N/a  Assessment & Plan:   Presumed parapneumonic  effusion: Initial CT scan with very small effusion, repeat chest x-ray in the setting of ongoing pleurisy revealed significant volume loss on the left loculated to significant atelectasis and enlarging pleural effusion also contributing.  This prompted a repeat CT scan which showed a relatively small albeit enlarged pleural effusion now significantly loculated different from before with almost total left lower lobe atelectasis on CT scan.  Again I think the majority of changes on chest x-ray related to atelectasis, there is signs of shift of trachea to the left or volume loss.  Initial thoracentesis revealed exudative effusion PMN predominant.  Chest tube placed 3/28 with appearance, development of loculated enlarging pleural effusion. LLL PNA: resp culture w/ MRSA Acute respiratory failure w/ hypoxia Plan: -wean  for sats >92% -CT chest -cont chest tube to suction -monitor chest tube outpt -consider pulling chest tube tomorrow pending CT results and chest tube outpt -cont linezolid per primary -pulm toiletry -mobilize as able: PT    Best practice (daily eval):   Per primary      JD [facility] 07/08/2023, 10:36 AM  Please see [email or web address] for pager details.  From 7A-7P if no response, please call [phone]. After hours, please call ELink [phone].

## 2023-07-08 NOTE — Inpatient Diabetes Management (Addendum)
 Inpatient Diabetes Program Recommendations  AACE/ADA: New Consensus Statement on Inpatient Glycemic Control (2015)  Target Ranges:  Prepandial:   less than 140 mg/dL      Peak postprandial:   less than 180 mg/dL (1-2 hours)      Critically ill patients:  140 - 180 mg/dL   Lab Results  Component Value Date   GLUCAP 276 (H) 07/07/2023   HGBA1C 8.1 (H) 07/01/2023    Review of Glycemic Control  Latest Reference Range & Units 07/07/23 07:39 07/07/23 11:37 07/07/23 16:28 07/07/23 20:56  Glucose-Capillary 70 - 99 mg/dL 098 (H) 119 (H) 147 (H) 276 (H)  (H): Data is abnormally high  Diabetes history: DM2 Outpatient Diabetes medications: Amaryl 0.5 mg BID, Ozempic 2 mg weekly, Farxiga 10 mg QD Current orders for Inpatient glycemic control: Semglee 14 units every day, Novolog 0-6 units TID and 0-5 units at bedtime, Novolog 3 units TID and Amaryl 0.5 mg BID  Inpatient Diabetes Program Recommendations:    Might consider:  Semglee 18 units every day Novolog 0-9 units TID  Might consider discontinuing Amaryl-agree with Novolog 3 units TID  Will continue to follow while inpatient.  Thank you, Dulce Sellar, MSN, CDCES Diabetes Coordinator Inpatient Diabetes Program [phone] (team pager from 8a-5p)

## 2023-07-08 NOTE — Progress Notes (Signed)
 PROGRESS NOTE    Jade Perez  WUJ:[id number] DOB: October 17, 1955 DOA: 06/30/2023 PCP: Abner Greenspan, MD    Brief Narrative:  68 year old with history of HTN, HLD, DM2, OSA, depression, asthma, NSTEMI with normal coronaries admitted for shortness of breath and cough.  Outpatient diagnosed of left lower lobe pneumonia on 3/22 but returns due to worsening of her pain and discomfort.  CTA chest negative for PE but does show left lower lobe consolidation started on Rocephin and azithromycin.  Unfortunately chest x-ray showed worsening of left-sided pleural effusion, thoracentesis on 3/27 yielded 220 cc with due to persistence of this effusion, CT was performed which showed concerns of loculation therefore chest tube placed 3/28.  Assessment & Plan:  Principal Problem:   CAP (community acquired pneumonia) Active Problems:   Intrinsic asthma   Type 2 diabetes mellitus without complication, without long-term current use of insulin (HCC)   Acute respiratory failure with hypoxia (HCC)   Hypokalemia   Hypertension   Depression   OSA (obstructive sleep apnea)   Hyperlipidemia, mixed    Left lower lobe community-acquired pneumonia; mrsa Left-sided pleural effusion, worsening Failed outpatient treatment.  During hospitalization also received IV Rocephin and azithromycin without any improvement.  Initial CTA showed left lower lobe consolidation but later repeat x-ray showed worsening of pleural effusion requiring thoracentesis on 3/27 yielding 320 cc without much improvement.  Repeat CT chest showed persistent of effusion with loculation therefore chest tube placed on 3/28.  Pulmonary team is following, in total received 3 doses of lytics.  Continue monitor chest tube output.  Now on linezolid due to MRSA and respiratory cultures -Continues to have good amount of chest tube output.  Repeat CT chest  Hypokalemia/hypophosphatemia Replete as needed  Hyponatremia - Suspect from hypovolemia.  Urine  studies suggestive of dehydration, received fluid bolus. Labs from today are pending   Essential hypertension Norvasc.  IV as needed   Hyperlipidemia Statin   Diabetes mellitus type 2; uncontrolled due to hyperglycmia.  - A1c was 6.5% in September 2023  -Holding home Ozempic.  Continue Amaryl.  Adjust long-acting and Premeal as necessary Sliding scale and Accu-Cheks   History of asthma and OSA Bronchodilators   Depression - Celexa    Smudge cells - Smudge cells noted on peripheral smear, percentage not specified  - Could be d/t the infection but she will need further workup with flow cytometry if leukocytosis persists   PT/OT-home health, face-to-face completed Ambulatory pulse ox  DVT prophylaxis: Lovenox    Code Status: Full Code Family Communication: Family at bedside Status is: Inpatient Remains inpatient appropriate because: Continue hospital side for abnormal breath sounds.  Chest tube management by pulmonary   Subjective: Feels more or less the same in last 24 hours.  No other complaints at this time  Examination:  General exam: Appears calm and comfortable  Respiratory system: Left side diminished breath sounds, better air movement Cardiovascular system: S1 & S2 heard, RRR. No JVD, murmurs, rubs, gallops or clicks. No pedal edema. Gastrointestinal system: Abdomen is nondistended, soft and nontender. No organomegaly or masses felt. Normal bowel sounds heard. Central nervous system: Alert and oriented. No focal neurological deficits. Extremities: Symmetric 5 x 5 power. Skin: Blood clot around chest tube insertion site s Psychiatry: Judgement and insight appear normal. Mood & affect appropriate. Left-sided chest tube               Diet Orders (From admission, onward)     Start     Ordered  07/04/23 1738  Diet regular Room service appropriate? Yes; Fluid consistency: Thin  Diet effective now       Question Answer Comment  Room service appropriate?  Yes   Fluid consistency: Thin      07/04/23 1737            Objective: Vitals:   07/07/23 1933 07/07/23 2003 07/08/23 0507 07/08/23 0906  BP: 116/66  122/69   Pulse: 85  73   Resp: 20  20   Temp: 98.5 F (36.9 C)  98 F (36.7 C)   TempSrc: Oral  Oral   SpO2: 97% 95% 96% 97%  Weight:      Height:        Intake/Output Summary (Last 24 hours) at 07/08/2023 1213 Last data filed at 07/08/2023 1118 Gross per 24 hour  Intake 820 ml  Output 650 ml  Net 170 ml   Filed Weights   06/30/23 0046  Weight: 90.7 kg    Scheduled Meds:  amLODipine  5 mg Oral Daily   arformoterol  15 mcg Nebulization BID   atorvastatin  40 mg Oral QHS   citalopram  20 mg Oral Daily   enoxaparin (LOVENOX) injection  40 mg Subcutaneous Q24H   fenofibrate  160 mg Oral Daily   ferrous sulfate  325 mg Oral Q breakfast   glimepiride  0.5 mg Oral BID   guaiFENesin  10 mL Oral Q6H   insulin aspart  0-5 Units Subcutaneous QHS   insulin aspart  0-6 Units Subcutaneous TID WC   insulin aspart  3 Units Subcutaneous TID WC   insulin glargine-yfgn  14 Units Subcutaneous Daily   lidocaine  2 patch Transdermal Q24H   montelukast  10 mg Oral QHS   pantoprazole  40 mg Oral Daily   revefenacin  175 mcg Nebulization Daily   sodium chloride flush  10 mL Intrapleural Q8H   sodium chloride flush  3 mL Intravenous Q12H   Continuous Infusions:  linezolid (ZYVOX) IV 600 mg (07/08/23 1200)    Nutritional status     Body mass index is 36.58 kg/m.  Data Reviewed:   CBC: Recent Labs  Lab 07/03/23 0540 07/04/23 0510 07/05/23 0836 07/06/23 0705 07/07/23 0454  WBC 15.6* 14.6* 14.0* 17.8* 20.7*  HGB 12.5 11.9* 12.2 13.1 13.8  HCT 37.7 35.6* 38.2 40.2 42.0  MCV 90.8 91.0 92.7 91.6 90.3  PLT 335 337 393 434* 529*   Basic Metabolic Panel: Recent Labs  Lab 07/03/23 0540 07/04/23 0510 07/05/23 0836 07/06/23 0705 07/07/23 0454  NA 133* 133* 135 131* 125*  K 3.4* 3.7 4.0 4.1 4.1  CL 97* 99 100 95* 91*   CO2 23 22 22 24 22   GLUCOSE 186* 216* 155* 173* 242*  BUN 13 13 14 14 14   CREATININE 0.64 0.59 0.62 0.76 0.71  CALCIUM 8.8* 8.7* 8.8* 8.8* 8.5*  MG 1.7 1.7 1.7 1.8 1.8   GFR: Estimated Creatinine Clearance: 69.5 mL/min (by C-G formula based on SCr of 0.71 mg/dL). Liver Function Tests: No results for input(s): "AST", "ALT", "ALKPHOS", "BILITOT", "PROT", "ALBUMIN" in the last 168 hours. No results for input(s): "LIPASE", "AMYLASE" in the last 168 hours. No results for input(s): "AMMONIA" in the last 168 hours. Coagulation Profile: Recent Labs  Lab 07/04/23 1342  INR 1.9*   Cardiac Enzymes: No results for input(s): "CKTOTAL", "CKMB", "CKMBINDEX", "TROPONINI" in the last 168 hours. BNP (last 3 results) No results for input(s): "PROBNP" in the last 8760 hours. HbA1C: No  results for input(s): "HGBA1C" in the last 72 hours. CBG: Recent Labs  Lab 07/07/23 1137 07/07/23 1628 07/07/23 2056 07/08/23 0828 07/08/23 1151  GLUCAP 311* 216* 276* 208* 278*   Lipid Profile: No results for input(s): "CHOL", "HDL", "LDLCALC", "TRIG", "CHOLHDL", "LDLDIRECT" in the last 72 hours. Thyroid Function Tests: No results for input(s): "TSH", "T4TOTAL", "FREET4", "T3FREE", "THYROIDAB" in the last 72 hours. Anemia Panel: No results for input(s): "VITAMINB12", "FOLATE", "FERRITIN", "TIBC", "IRON", "RETICCTPCT" in the last 72 hours. Sepsis Labs: Recent Labs  Lab 07/05/23 0836  PROCALCITON 0.78    Recent Results (from the past 240 hours)  Resp panel by RT-PCR (RSV, Flu A&B, Covid) Anterior Nasal Swab     Status: None   Collection Time: 06/28/23  7:16 PM   Specimen: Anterior Nasal Swab  Result Value Ref Range Status   SARS Coronavirus 2 by RT PCR NEGATIVE NEGATIVE Final    Comment: (NOTE) SARS-CoV-2 target nucleic acids are NOT DETECTED.  The SARS-CoV-2 RNA is generally detectable in upper respiratory specimens during the acute phase of infection. The lowest concentration of SARS-CoV-2  viral copies this assay can detect is 138 copies/mL. A negative result does not preclude SARS-Cov-2 infection and should not be used as the sole basis for treatment or other patient management decisions. A negative result may occur with  improper specimen collection/handling, submission of specimen other than nasopharyngeal swab, presence of viral mutation(s) within the areas targeted by this assay, and inadequate number of viral copies(<138 copies/mL). A negative result must be combined with clinical observations, patient history, and epidemiological information. The expected result is Negative.  Fact Sheet for Patients:  BloggerCourse.com  Fact Sheet for Healthcare Providers:  SeriousBroker.it  This test is no t yet approved or cleared by the [place] FDA and  has been authorized for detection and/or diagnosis of SARS-CoV-2 by FDA under an Emergency Use Authorization (EUA). This EUA will remain  in effect (meaning this test can be used) for the duration of the COVID-19 declaration under Section 564(b)(1) of the Act, 21 U.S.C.section 360bbb-3(b)(1), unless the authorization is terminated  or revoked sooner.       Influenza A by PCR NEGATIVE NEGATIVE Final   Influenza B by PCR NEGATIVE NEGATIVE Final    Comment: (NOTE) The Xpert Xpress SARS-CoV-2/FLU/RSV plus assay is intended as an aid in the diagnosis of influenza from Nasopharyngeal swab specimens and should not be used as a sole basis for treatment. Nasal washings and aspirates are unacceptable for Xpert Xpress SARS-CoV-2/FLU/RSV testing.  Fact Sheet for Patients: BloggerCourse.com  Fact Sheet for Healthcare Providers: SeriousBroker.it  This test is not yet approved or cleared by the [place] FDA and has been authorized for detection and/or diagnosis of SARS-CoV-2 by FDA under an Emergency Use Authorization  (EUA). This EUA will remain in effect (meaning this test can be used) for the duration of the COVID-19 declaration under Section 564(b)(1) of the Act, 21 U.S.C. section 360bbb-3(b)(1), unless the authorization is terminated or revoked.     Resp Syncytial Virus by PCR NEGATIVE NEGATIVE Final    Comment: (NOTE) Fact Sheet for Patients: BloggerCourse.com  Fact Sheet for Healthcare Providers: SeriousBroker.it  This test is not yet approved or cleared by the [place] FDA and has been authorized for detection and/or diagnosis of SARS-CoV-2 by FDA under an Emergency Use Authorization (EUA). This EUA will remain in effect (meaning this test can be used) for the duration of the COVID-19 declaration under Section 564(b)(1) of the Act,  21 U.S.C. section 360bbb-3(b)(1), unless the authorization is terminated or revoked.  Performed at [facility], [address] [place], [place] [address]   Blood culture (routine x 2)     Status: None   Collection Time: 06/30/23  1:03 AM   Specimen: BLOOD  Result Value Ref Range Status   Specimen Description   Final    BLOOD LEFT ANTECUBITAL Performed at [facility], [address] [place], [place] [address]    Special Requests   Final    BOTTLES DRAWN AEROBIC AND ANAEROBIC Blood Culture adequate volume Performed at [facility], [address] [place], [place] [address]    Culture   Final    NO GROWTH 5 DAYS Performed at [facility], [address], [place], [place] [address]    Report Status 07/05/2023 FINAL  Final  Blood culture (routine x 2)     Status: None   Collection Time: 06/30/23  1:03 AM   Specimen: BLOOD RIGHT FOREARM  Result Value Ref Range Status   Specimen Description   Final    BLOOD RIGHT FOREARM Performed at [facility], [address] [place], [place] [address]    Special Requests   Final    BOTTLES DRAWN AEROBIC  AND ANAEROBIC Blood Culture adequate volume Performed at [facility], [address] [place], [place] [address]    Culture   Final    NO GROWTH 5 DAYS Performed at [facility], [address] [place], [place] [address]    Report Status 07/05/2023 FINAL  Final  Expectorated Sputum Assessment w Gram Stain, Rflx to Resp Cult     Status: None   Collection Time: 07/02/23  4:43 PM   Specimen: Expectorated Sputum  Result Value Ref Range Status   Specimen Description EXPECTORATED SPUTUM  Final   Special Requests NONE  Final   Sputum evaluation   Final    THIS SPECIMEN IS ACCEPTABLE FOR SPUTUM CULTURE Performed at [facility], [address], [place], [place] [address]    Report Status 07/02/2023 FINAL  Final  Culture, Respiratory w Gram Stain     Status: None   Collection Time: 07/02/23  4:43 PM  Result Value Ref Range Status   Specimen Description   Final    EXPECTORATED SPUTUM Performed at [facility], [address] [place], [place] [address]    Special Requests   Final    NONE Reflexed from [id number] Performed at [facility], [address] [place], [place] [address]    Gram Stain   Final    FEW WBC PRESENT, PREDOMINANTLY PMN FEW GRAM POSITIVE COCCI RARE YEAST WITH PSEUDOHYPHAE RARE GRAM NEGATIVE RODS Performed at [facility], [address], [place], [place] [address]    Culture   Final    RARE METHICILLIN RESISTANT STAPHYLOCOCCUS AUREUS RARE CANDIDA ALBICANS    Report Status 07/06/2023 FINAL  Final   Organism ID, Bacteria METHICILLIN RESISTANT STAPHYLOCOCCUS AUREUS  Final      Susceptibility   Methicillin resistant staphylococcus aureus - MIC*    CIPROFLOXACIN >=8 RESISTANT Resistant     ERYTHROMYCIN >=8 RESISTANT Resistant     GENTAMICIN <=0.5 SENSITIVE Sensitive     OXACILLIN >=4 RESISTANT Resistant     TETRACYCLINE <=1 SENSITIVE Sensitive     VANCOMYCIN <=0.5 SENSITIVE Sensitive      TRIMETH/SULFA >=320  RESISTANT Resistant     CLINDAMYCIN >=8 RESISTANT Resistant     RIFAMPIN <=0.5 SENSITIVE Sensitive     Inducible Clindamycin NEGATIVE Sensitive     LINEZOLID 2 SENSITIVE Sensitive     * RARE METHICILLIN RESISTANT STAPHYLOCOCCUS AUREUS  Fungus Culture With Stain     Status: None (Preliminary result)   Collection Time: 07/03/23  2:24 PM   Specimen: PATH Cytology Pleural fluid  Result Value Ref Range Status   Fungus Stain Final report  Final    Comment: (NOTE) Performed At: [facility] [address] [place], [place] [id number] Jolene Schimke MD AO:[id number]    Fungus (Mycology) Culture PENDING  Incomplete   Fungal Source PLEURAL  Final    Comment: Performed at [facility] [address] [place], [place] [address]  Culture, Fungus without Smear     Status: None (Preliminary result)   Collection Time: 07/03/23  2:24 PM   Specimen: PATH Cytology Pleural fluid  Result Value Ref Range Status   Specimen Description   Final    PLEURAL Performed at [facility], [address] [place], [place] [address]    Special Requests   Final    NONE Performed at [facility], [address], [place], [place] [address]    Culture   Final    NO FUNGUS ISOLATED AFTER 5 DAYS Performed at [facility], [address] [place], [place] [address]    Report Status PENDING  Incomplete  Body fluid culture w Gram Stain     Status: None   Collection Time: 07/03/23  2:24 PM   Specimen: PATH Cytology Pleural fluid  Result Value Ref Range Status   Specimen Description   Final    PLEURAL Performed at [facility], [address] [place], [place] [address]    Special Requests   Final    NONE Performed at [facility], [address], [place], [place] [address]    Gram Stain NO WBC SEEN NO ORGANISMS SEEN   Final   Culture   Final    NO GROWTH 3 DAYS Performed at  [facility], [address] [place], [place] [address]    Report Status 07/06/2023 FINAL  Final  Fungus Culture Result     Status: None   Collection Time: 07/03/23  2:24 PM  Result Value Ref Range Status   Result 1 Comment  Final    Comment: (NOTE) KOH/Calcofluor preparation:  no fungus observed. Performed At: [facility] [address] [place], [place] [id number] Jolene Schimke MD RJ:[id number]   Respiratory (~20 pathogens) panel by PCR     Status: None   Collection Time: 07/04/23 10:04 AM   Specimen: Nasopharyngeal Swab; Respiratory  Result Value Ref Range Status   Adenovirus NOT DETECTED NOT DETECTED Final   Coronavirus 229E NOT DETECTED NOT DETECTED Final    Comment: (NOTE) The Coronavirus on the Respiratory Panel, DOES NOT test for the novel  Coronavirus (2019 nCoV)    Coronavirus HKU1 NOT DETECTED NOT DETECTED Final   Coronavirus NL63 NOT DETECTED NOT DETECTED Final   Coronavirus OC43 NOT DETECTED NOT DETECTED Final   Metapneumovirus NOT DETECTED NOT DETECTED Final   Rhinovirus / Enterovirus NOT DETECTED NOT DETECTED Final   Influenza A NOT DETECTED NOT DETECTED Final   Influenza B NOT DETECTED NOT DETECTED Final   Parainfluenza Virus 1 NOT DETECTED NOT DETECTED Final   Parainfluenza Virus 2 NOT  DETECTED NOT DETECTED Final   Parainfluenza Virus 3 NOT DETECTED NOT DETECTED Final   Parainfluenza Virus 4 NOT DETECTED NOT DETECTED Final   Respiratory Syncytial Virus NOT DETECTED NOT DETECTED Final   Bordetella pertussis NOT DETECTED NOT DETECTED Final   Bordetella Parapertussis NOT DETECTED NOT DETECTED Final   Chlamydophila pneumoniae NOT DETECTED NOT DETECTED Final   Mycoplasma pneumoniae NOT DETECTED NOT DETECTED Final    Comment: Performed at [facility], [address], [place], [place] [address]         Radiology Studies: DG Chest Port 1 View Result Date: 07/07/2023 CLINICAL DATA:  Pleural effusion. EXAM: PORTABLE CHEST 1 VIEW  COMPARISON:  X-ray 07/06/2023 and older FINDINGS: Stable left-sided pigtail catheter. Decreasing left effusion with significant residual. Associated parenchymal opacity. Decreasing right basilar atelectasis. No pneumothorax or edema. Stable cardiopericardial silhouette. Film is under penetrated. Degenerative changes along the spine IMPRESSION: Decreasing left pleural effusion with significant residual. No pneumothorax Electronically Signed   By: Karen Kays M.D.   On: 07/07/2023 11:14           LOS: 8 days   Time spent= 35 mins    Miguel Rota, MD [facility]  If 7PM-7AM, please contact night-coverage  07/08/2023, 12:13 PM

## 2023-07-08 NOTE — Progress Notes (Signed)
   07/08/23 2030  BiPAP/CPAP/SIPAP  BiPAP/CPAP/SIPAP Pt Type Adult  Reason BIPAP/CPAP not in use Non-compliant (Pt states she wants to wear her O2 and not cpap tonight)

## 2023-07-08 NOTE — Plan of Care (Signed)

## 2023-07-09 ENCOUNTER — Inpatient Hospital Stay (HOSPITAL_COMMUNITY)

## 2023-07-09 DIAGNOSIS — J90 Pleural effusion, not elsewhere classified: Secondary | ICD-10-CM | POA: Diagnosis not present

## 2023-07-09 DIAGNOSIS — J189 Pneumonia, unspecified organism: Secondary | ICD-10-CM | POA: Diagnosis not present

## 2023-07-09 LAB — CBC
HCT: 31.4 % — ABNORMAL LOW (ref 36.0–46.0)
Hemoglobin: 10.7 g/dL — ABNORMAL LOW (ref 12.0–15.0)
MCH: 30.3 pg (ref 26.0–34.0)
MCHC: 34.1 g/dL (ref 30.0–36.0)
MCV: 89 fL (ref 80.0–100.0)
Platelets: 456 10*3/uL — ABNORMAL HIGH (ref 150–400)
RBC: 3.53 MIL/uL — ABNORMAL LOW (ref 3.87–5.11)
RDW: 14.3 % (ref 11.5–15.5)
WBC: 9.6 10*3/uL (ref 4.0–10.5)
nRBC: 0 % (ref 0.0–0.2)

## 2023-07-09 LAB — GLUCOSE, CAPILLARY
Glucose-Capillary: 117 mg/dL — ABNORMAL HIGH (ref 70–99)
Glucose-Capillary: 231 mg/dL — ABNORMAL HIGH (ref 70–99)
Glucose-Capillary: 111 mg/dL — ABNORMAL HIGH (ref 70–99)
Glucose-Capillary: 178 mg/dL — ABNORMAL HIGH (ref 70–99)

## 2023-07-09 LAB — BASIC METABOLIC PANEL WITH GFR
Anion gap: 8 (ref 5–15)
BUN: 10 mg/dL (ref 8–23)
CO2: 28 mmol/L (ref 22–32)
Calcium: 8.3 mg/dL — ABNORMAL LOW (ref 8.9–10.3)
Chloride: 98 mmol/L (ref 98–111)
Creatinine, Ser: 0.63 mg/dL (ref 0.44–1.00)
GFR, Estimated: >60 mL/min (ref >60)
Glucose, Bld: 127 mg/dL — ABNORMAL HIGH (ref 70–99)
Potassium: 4.2 mmol/L (ref 3.5–5.1)
Sodium: 134 mmol/L — ABNORMAL LOW (ref 135–145)

## 2023-07-09 LAB — MAGNESIUM: Magnesium: 1.9 mg/dL (ref 1.7–2.4)

## 2023-07-09 MED ORDER — ENOXAPARIN SODIUM 40 MG/0.4ML IJ SOSY
40.0000 mg | PREFILLED_SYRINGE | INTRAMUSCULAR | Status: DC
  Administered 2023-07-11 – 2023-07-12 (×2): 40 mg via SUBCUTANEOUS
  Filled 2023-07-09 (×2): qty 0.4

## 2023-07-09 NOTE — Plan of Care (Signed)
  Problem: Education: Goal: Knowledge of General Education information will improve Description: Including pain rating scale, medication(s)/side effects and non-pharmacologic comfort measures Outcome: Progressing   Problem: Clinical Measurements: Goal: Ability to maintain clinical measurements within normal limits will improve Outcome: Progressing Goal: Diagnostic test results will improve Outcome: Progressing Goal: Respiratory complications will improve Outcome: Progressing   Problem: Activity: Goal: Risk for activity intolerance will decrease Outcome: Progressing   Problem: Nutrition: Goal: Adequate nutrition will be maintained Outcome: Progressing   Problem: Pain Managment: Goal: General experience of comfort will improve and/or be controlled Outcome: Progressing   Problem: Education: Goal: Ability to describe self-care measures that may prevent or decrease complications (Diabetes Survival Skills Education) will improve Outcome: Progressing   Problem: Metabolic: Goal: Ability to maintain appropriate glucose levels will improve Outcome: Progressing

## 2023-07-09 NOTE — Progress Notes (Signed)
 NAME:  Jade Perez, MRN:  [id number], DOB:  09/21/1955, LOS: 9 ADMISSION DATE:  06/30/2023, CONSULTATION DATE:  07/04/2023 REFERRING MD:  DR Nelson Chimes, CHIEF COMPLAINT:  LLL white out    History of Present Illness:   68 year old female with past medical history of non-smoking, hypertension, hyperlipidemia, type 2 diabetes, OSA, depression, asthma not otherwise specified presented on 06/30/2023 with cough worsening dyspnea and pleuritic chest pain on the left lower lobe. PRior to that had sick resp contact with grandchild who live with her and then she got sick  CT chest showed left lower lobe consolidation with a small pleural effusion [personal visualization of the CCM MD on this consult] she was hypoxemic requiring oxygen.  She had leukocytosis with white count 20,100.  Pulmonary embolism was ruled out started on Treatment.  Subsequently urine strep and Legionella negative.  Respiratory virus panel for COVID, flu and RSV negative [20 panel not done].  Since admission has been afebrile.  Has improving leukocytosis from 20,002 currently 14,600.  Cultures remain negative.  On 07/03/2023 underwent left-sided thoracentesis removing 350 cc by interventional radiology.  This shows a simple exudate with LDH 633 and acute neutrophilic leukocytosis with polymorphs of 86%.  No eosinophils.  Despite all these interventions has remained on 4 L nasal cannula.  She has been covered by IV antibiotics with ceftriaxone and azithromycin since admission and due to and on 07/04/2023 [facility] have PCCM consult].  Of note prior to admission she was treated as outpatient with antibiotics and then failed that.   Pulmonary is now been consulted with a question of residual loculated effusion  Past Medical History:    has a past medical history of Allergic rhinitis, Asthma, mild intermittent, Complication of anesthesia, Depression, GAD (generalized anxiety disorder), GERD (gastroesophageal reflux disease), Hiatal hernia,  History of cardiomyopathy in adulthood (12/06/2021), History of heart murmur in childhood, History of kidney stones, History of non-ST elevation myocardial infarction (NSTEMI) (12/06/2021), Hyperlipidemia, mixed, Hypertension, IDA (iron deficiency anemia), OA (osteoarthritis), OSA (obstructive sleep apnea), Renal disorder, Type 2 diabetes mellitus (HCC) (2008), and Wears glasses.   reports that she has never smoked. She has never used smokeless tobacco.   Significant Hospital Events:  06/30/2023 - admit -> CT angiogram rule out pulmonary embolism.  Small left-sided pleural effusion.  Significant left lower lobe consolidation 07/03/2023: Worsening chest x-ray with significant whiteout on the left side.  S/p thoracentesis by IR draining only 320 cc.  Simple exudate -> post Thora chest x-ray without change 3/28 chest tube placed via IR 3/29 1st dose lytics after IR was able to troubleshoot tube after would not flush or aspirate  3/30 2nd dose of tube lytics 3/31 650 CT outpt; repeat 3rd dose lytics today 4/1 repeat chest CT  Interim History / Subjective:  On RA, intermittent 2L O2, continues to feel better Afebrile CT output 210 ml/ 24hrs  Objective   Blood pressure (!) 146/80, pulse 77, temperature 98.3 F (36.8 C), temperature source Oral, resp. rate 19, height 5\' 2"  (1.575 m), weight 90.7 kg, SpO2 93%.        Intake/Output Summary (Last 24 hours) at 07/09/2023 1030 Last data filed at 07/09/2023 0900 Gross per 24 hour  Intake 1131.88 ml  Output 250 ml  Net 881.88 ml   Filed Weights   06/30/23 0046  Weight: 90.7 kg    Examination: General:  Pleasant older obese female sitting in bedside recliner in NAD Neuro:  AO, MAE CV: rr PULM:  non  labored, clear anteriorly, few left posterior basilar rales/ rhonchi, left anterior pigtail CT to 20 cm sxn, no airleak, scant serosanguinous drainage GI: soft, bs+ Extremities: warm/dry, no tibial edema  Skin: no rashes    Resolved Hospital  Problem list   N/a  Assessment & Plan:   Parapneumonic effusion:  - Initial thoracentesis revealed exudative effusion PMN predominant.   - IR CT guided CT placed 3/28 with appearance, development of loculated enlarging pleural effusion s/p intrapleural lytics 3/29, 3/30, and 3/31 MRSA LLL PNA Acute respiratory failure w/ hypoxia - repeat CT chest 4/1 reveals small dependent left pleural effusion, slightly loculated, decreased in size. Present anterior left CT not in region of remaining pleural fluid.  Otherwise stable atelectasis and consolidation of LLL, new RLL atelectasis, trace right pleural effusion unchanged Plan: - CT output remains > 200 ml/ 24hrs, continue for now, monitor output on suction - will discuss with attending/ IR concerning any benefit in placing additional pigtail, however output still too high to pull and size of dependent effusion is decreasing, pleural fluid without growth, and overall improving clinical course> continue current course for now   - pleural fluid remains no growth to date, follow - cont supplemental prn O2 for sat goal > 92% - cont linezolid per primary, plans for 7 days of therapy   -pulm hygiene, mobilize  - PCCM will continue to follow   Best practice (daily eval):  Per primary         Posey Boyer, MSN, AG-ACNP-BC [facility] 07/09/2023, 12:15 PM  See Amion for pager If no response to pager , please call [phone] until 7pm After 7:00 pm call Elink  [phone]

## 2023-07-09 NOTE — Inpatient Diabetes Management (Signed)
 Inpatient Diabetes Program Recommendations  AACE/ADA: New Consensus Statement on Inpatient Glycemic Control (2015)  Target Ranges:  Prepandial:   less than 140 mg/dL      Peak postprandial:   less than 180 mg/dL (1-2 hours)      Critically ill patients:  140 - 180 mg/dL   Lab Results  Component Value Date   GLUCAP 117 (H) 07/09/2023   HGBA1C 8.1 (H) 07/01/2023    Review of Glycemic Control  Latest Reference Range & Units 07/08/23 08:28 07/08/23 11:51 07/08/23 17:42 07/08/23 20:59 07/09/23 08:05  Glucose-Capillary 70 - 99 mg/dL 161 (H) 096 (H) 045 (H) 226 (H) 117 (H)  (H): Data is abnormally high  Diabetes history: DM2 Outpatient Diabetes medications: Amaryl 0.5 mg BID, Ozempic 2 mg weekly, Farxiga 10 mg QD Current orders for Inpatient glycemic control: Semglee 14 units every day, Novolog 0-6 units TID and 0-5 units at bedtime, Novolog 3 units TID and Amaryl 0.5 mg BID  Inpatient Diabetes Program Recommendations:    Please consider increasing meal coverage:  Novolog 5 units TID with meals if she consumes at least 50%.  Will continue to follow while inpatient.  Thank you, Dulce Sellar, MSN, CDCES Diabetes Coordinator Inpatient Diabetes Program [phone] (team pager from 8a-5p)

## 2023-07-09 NOTE — Plan of Care (Signed)

## 2023-07-09 NOTE — Progress Notes (Signed)
 Physical Therapy Treatment Patient Details Name: Jade Perez MRN: [id number] DOB: 1956/01/04 Today's Date: 07/09/2023   History of Present Illness 68 yo female admitted with Pna. Hx of DM, OSA, depression, asthma, NSTEMI, RCR    PT Comments  Pt reports anxiousness regarding returning home with chest tube and managing at home with grandchildren and obstacles. Pt amb 8 ft x2 with seated rest break, supv for safety with all mobility with therapist managing O2 and chest tube lines. Pt needing significantly increased time with mobility. Pt on 2L O2 with SpO2 94-96%, no SOB noted with mobility. Educated pt and spouse on mobility and line management, encouraged mobility with nursing as tolerable.   If plan is discharge home, recommend the following: A little help with walking and/or transfers;A little help with bathing/dressing/bathroom;Assistance with cooking/housework;Assist for transportation;Help with stairs or ramp for entrance   Can travel by private vehicle        Equipment Recommendations  None recommended by PT    Recommendations for Other Services       Precautions / Restrictions Precautions Precautions: Fall Precaution/Restrictions Comments: monitor O2, chest tube Restrictions Weight Bearing Restrictions Per Provider Order: No     Mobility  Bed Mobility Overal bed mobility: Needs Assistance Bed Mobility: Supine to Sit, Sit to Supine     Supine to sit: Supervision, HOB elevated, Used rails Sit to supine: Supervision, HOB elevated, Used rails   General bed mobility comments: supv with therapist managing lines for safety, increased time and effort without physical assistance    Transfers Overall transfer level: Needs assistance Equipment used: Rolling walker (2 wheels) Transfers: Sit to/from Stand Sit to Stand: Supervision           General transfer comment: single UE assisitng to power up, BLE braced against bed to power up and for controlled descent     Ambulation/Gait Ambulation/Gait assistance: Supervision Gait Distance (Feet): 8 Feet (x2) Assistive device: Rolling walker (2 wheels) Gait Pattern/deviations: Step-through pattern, Decreased stride length Gait velocity: significantly decreased     General Gait Details: step through gait pattern, slow cadence, good bil foot clearance, no SOB noted, therapist managing lines for safety, on 2L with SpO2 94-96%   Stairs             Wheelchair Mobility     Tilt Bed    Modified Rankin (Stroke Patients Only)       Balance Overall balance assessment: Needs assistance Sitting-balance support: No upper extremity supported, Feet supported Sitting balance-Leahy Scale: Good     Standing balance support: During functional activity, Reliant on assistive device for balance, Bilateral upper extremity supported Standing balance-Leahy Scale: Fair                              Hotel manager: No apparent difficulties  Cognition Arousal: Alert Behavior During Therapy: WFL for tasks assessed/performed, Anxious   PT - Cognitive impairments: No apparent impairments                       PT - Cognition Comments: pt reports anxiousness regarding returning home with chest tube Following commands: Intact      Cueing Cueing Techniques: Verbal cues  Exercises      General Comments General comments (skin integrity, edema, etc.): pt on 2L O2 with SpO2 94-96% with mobility      Pertinent Vitals/Pain Pain Assessment Pain Assessment: Faces Faces Pain Scale: Hurts a  little bit Pain Location: chest tube site Pain Descriptors / Indicators: Aching, Sore Pain Intervention(s): Limited activity within patient's tolerance, Monitored during session, Premedicated before session, Repositioned    Home Living                          Prior Function            PT Goals (current goals can now be found in the care plan section)  Acute Rehab PT Goals Patient Stated Goal: to get better and regain PLOF Time For Goal Achievement: 07/15/23 Potential to Achieve Goals: Good Progress towards PT goals: Progressing toward goals    Frequency    Min 1X/week      PT Plan      Co-evaluation              AM-PAC PT "6 Clicks" Mobility   Outcome Measure  Help needed turning from your back to your side while in a flat bed without using bedrails?: None Help needed moving from lying on your back to sitting on the side of a flat bed without using bedrails?: None Help needed moving to and from a bed to a chair (including a wheelchair)?: A Little Help needed standing up from a chair using your arms (e.g., wheelchair or bedside chair)?: A Little Help needed to walk in hospital room?: A Little Help needed climbing 3-5 steps with a railing? : A Lot 6 Click Score: 19    End of Session Equipment Utilized During Treatment: Oxygen Activity Tolerance: Patient limited by fatigue Patient left: with call bell/phone within reach;with family/visitor present;in bed Nurse Communication: Mobility status PT Visit Diagnosis: Unsteadiness on feet (R26.81);Muscle weakness (generalized) (M62.81)     Time: 8413-2440 PT Time Calculation (min) (ACUTE ONLY): 31 min  Charges:    $Gait Training: 8-22 mins $Therapeutic Activity: 8-22 mins PT General Charges $$ ACUTE PT VISIT: 1 Visit                     Tori Jazon Jipson PT, DPT 07/09/23, 11:16 AM

## 2023-07-09 NOTE — Progress Notes (Signed)
 PROGRESS NOTE    Jade Perez  RUE:[id number] DOB: 1955/08/06 DOA: 06/30/2023 PCP: Abner Greenspan, MD    Brief Narrative:  68 year old with history of HTN, HLD, DM2, OSA, depression, asthma, NSTEMI with normal coronaries admitted for shortness of breath and cough.  Outpatient diagnosed of left lower lobe pneumonia on 3/22 but returns due to worsening of her pain and discomfort.  CTA chest negative for PE but does show left lower lobe consolidation started on Rocephin and azithromycin.  Unfortunately chest x-ray showed worsening of left-sided pleural effusion, thoracentesis on 3/27 yielded 320 cc with due to persistence of this effusion, CT was performed which showed concerns of loculation therefore chest tube placed 3/28.  Patient had chest tube placed and received 3 doses of lytics since then.  Repeat CT chest on 4/1 shows quite a bit of improvement.  3/27-thoracentesis yielding 320 cc 3/28-persistent loculated effusions noted on CT chest and chest tube was placed 3/29, 3/30, 3/31 -received 3 doses of lytics once daily 4/1 -repeat CT chest shows improvement in effusion.  Only small loculations  Assessment & Plan:  Principal Problem:   CAP (community acquired pneumonia) Active Problems:   Intrinsic asthma   Type 2 diabetes mellitus without complication, without long-term current use of insulin (HCC)   Acute respiratory failure with hypoxia (HCC)   Hypokalemia   Hypertension   Depression   OSA (obstructive sleep apnea)   Hyperlipidemia, mixed    Left lower lobe community-acquired pneumonia; mrsa Left-sided pleural effusion, worsening Failed outpatient treatment.  During hospitalization also received IV Rocephin and azithromycin without any improvement.  Initial CTA showed left lower lobe consolidation but later repeat x-ray showed worsening of pleural effusion requiring thoracentesis on 3/27 yielding 320 cc without much improvement.  Repeat CT chest showed persistent of effusion with  loculation therefore chest tube placed on 3/28.  Thereafter received 3 doses of lytics.  Cultures grew MRSA therefore linezolid - Repeat CT chest 4/1 shows improved and loculations.  There is some small loculated effusion but appears dependent in nature.  Hopefully we can take the chest tube out today but will defer this decision to pulmonary.  Hypokalemia/hypophosphatemia Replete as needed  Hyponatremia - Suspect from hypovolemia.  Improved with hydration.  Encourage p.o. intake   Essential hypertension Norvasc.  IV as needed   Hyperlipidemia Statin   Diabetes mellitus type 2; uncontrolled due to hyperglycmia.  - A1c was 6.5% in September 2023  -Holding home Ozempic.  Continue Amaryl.  Adjust long-acting and Premeal as necessary Sliding scale and Accu-Cheks   History of asthma and OSA Bronchodilators   Depression - Celexa    Smudge cells - Smudge cells noted on peripheral smear, percentage not specified  - Could be d/t the infection but she will need further workup with flow cytometry if leukocytosis persists   PT/OT-home health, face-to-face completed.  Will need updated PT eval Ambulatory pulse ox  DVT prophylaxis: Lovenox    Code Status: Full Code Family Communication: Family at bedside Status is: Inpatient Remains inpatient appropriate because: Hopefully discharge in next 24-48 hours   Subjective: Feels a little better compared to yesterday.  Awaiting pulmonary evaluation today to see if we can remove the chest tube.  Examination:  General exam: Appears calm and comfortable  Respiratory system: Left side diminished breath sounds, better air movement Cardiovascular system: S1 & S2 heard, RRR. No JVD, murmurs, rubs, gallops or clicks. No pedal edema. Gastrointestinal system: Abdomen is nondistended, soft and nontender. No organomegaly or masses  felt. Normal bowel sounds heard. Central nervous system: Alert and oriented. No focal neurological deficits. Extremities:  Symmetric 5 x 5 power. Skin: Blood clot around chest tube insertion site s Psychiatry: Judgement and insight appear normal. Mood & affect appropriate. Left-sided chest tube               Diet Orders (From admission, onward)     Start     Ordered   07/04/23 1738  Diet regular Room service appropriate? Yes; Fluid consistency: Thin  Diet effective now       Question Answer Comment  Room service appropriate? Yes   Fluid consistency: Thin      07/04/23 1737            Objective: Vitals:   07/08/23 1831 07/08/23 1948 07/09/23 0520 07/09/23 0933  BP:  123/66 (!) 146/80   Pulse:  79 77   Resp:  20 19   Temp:  97.8 F (36.6 C) 98.3 F (36.8 C)   TempSrc:  Oral Oral   SpO2: 97% 100% 96% 93%  Weight:      Height:        Intake/Output Summary (Last 24 hours) at 07/09/2023 1137 Last data filed at 07/09/2023 0900 Gross per 24 hour  Intake 1121.88 ml  Output 250 ml  Net 871.88 ml   Filed Weights   06/30/23 0046  Weight: 90.7 kg    Scheduled Meds:  amLODipine  5 mg Oral Daily   arformoterol  15 mcg Nebulization BID   atorvastatin  40 mg Oral QHS   citalopram  20 mg Oral Daily   enoxaparin (LOVENOX) injection  40 mg Subcutaneous Q24H   fenofibrate  160 mg Oral Daily   ferrous sulfate  325 mg Oral Q breakfast   glimepiride  0.5 mg Oral BID   guaiFENesin  10 mL Oral Q6H   insulin aspart  0-5 Units Subcutaneous QHS   insulin aspart  0-6 Units Subcutaneous TID WC   insulin aspart  3 Units Subcutaneous TID WC   insulin glargine-yfgn  14 Units Subcutaneous Daily   lidocaine  2 patch Transdermal Q24H   montelukast  10 mg Oral QHS   pantoprazole  40 mg Oral Daily   revefenacin  175 mcg Nebulization Daily   sodium chloride flush  10 mL Intrapleural Q8H   sodium chloride flush  3 mL Intravenous Q12H   Continuous Infusions:  linezolid (ZYVOX) IV 600 mg (07/09/23 1018)    Nutritional status     Body mass index is 36.58 kg/m.  Data Reviewed:   CBC: Recent  Labs  Lab 07/04/23 0510 07/05/23 0836 07/06/23 0705 07/07/23 0454 07/09/23 0557  WBC 14.6* 14.0* 17.8* 20.7* 9.6  HGB 11.9* 12.2 13.1 13.8 10.7*  HCT 35.6* 38.2 40.2 42.0 31.4*  MCV 91.0 92.7 91.6 90.3 89.0  PLT 337 393 434* 529* 456*   Basic Metabolic Panel: Recent Labs  Lab 07/04/23 0510 07/05/23 0836 07/06/23 0705 07/07/23 0454 07/08/23 1602 07/09/23 0557  NA 133* 135 131* 125* 130* 134*  K 3.7 4.0 4.1 4.1 4.3 4.2  CL 99 100 95* 91* 94* 98  CO2 22 22 24 22 26 28   GLUCOSE 216* 155* 173* 242* 249* 127*  BUN 13 14 14 14 13 10   CREATININE 0.59 0.62 0.76 0.71 0.73 0.63  CALCIUM 8.7* 8.8* 8.8* 8.5* 8.4* 8.3*  MG 1.7 1.7 1.8 1.8  --  1.9   GFR: Estimated Creatinine Clearance: 69.5 mL/min (by C-G formula based  on SCr of 0.63 mg/dL). Liver Function Tests: No results for input(s): "AST", "ALT", "ALKPHOS", "BILITOT", "PROT", "ALBUMIN" in the last 168 hours. No results for input(s): "LIPASE", "AMYLASE" in the last 168 hours. No results for input(s): "AMMONIA" in the last 168 hours. Coagulation Profile: Recent Labs  Lab 07/04/23 1342  INR 1.9*   Cardiac Enzymes: No results for input(s): "CKTOTAL", "CKMB", "CKMBINDEX", "TROPONINI" in the last 168 hours. BNP (last 3 results) No results for input(s): "PROBNP" in the last 8760 hours. HbA1C: No results for input(s): "HGBA1C" in the last 72 hours. CBG: Recent Labs  Lab 07/08/23 0828 07/08/23 1151 07/08/23 1742 07/08/23 2059 07/09/23 0805  GLUCAP 208* 278* 210* 226* 117*   Lipid Profile: No results for input(s): "CHOL", "HDL", "LDLCALC", "TRIG", "CHOLHDL", "LDLDIRECT" in the last 72 hours. Thyroid Function Tests: No results for input(s): "TSH", "T4TOTAL", "FREET4", "T3FREE", "THYROIDAB" in the last 72 hours. Anemia Panel: No results for input(s): "VITAMINB12", "FOLATE", "FERRITIN", "TIBC", "IRON", "RETICCTPCT" in the last 72 hours. Sepsis Labs: Recent Labs  Lab 07/05/23 0836  PROCALCITON 0.78    Recent Results  (from the past 240 hours)  Blood culture (routine x 2)     Status: None   Collection Time: 06/30/23  1:03 AM   Specimen: BLOOD  Result Value Ref Range Status   Specimen Description   Final    BLOOD LEFT ANTECUBITAL Performed at [facility], [address] [place], [place] [address]    Special Requests   Final    BOTTLES DRAWN AEROBIC AND ANAEROBIC Blood Culture adequate volume Performed at [facility], [address] [place], [place] [address]    Culture   Final    NO GROWTH 5 DAYS Performed at [facility], [address], [place], [place] [address]    Report Status 07/05/2023 FINAL  Final  Blood culture (routine x 2)     Status: None   Collection Time: 06/30/23  1:03 AM   Specimen: BLOOD RIGHT FOREARM  Result Value Ref Range Status   Specimen Description   Final    BLOOD RIGHT FOREARM Performed at [facility], [address] [place], [place] [address]    Special Requests   Final    BOTTLES DRAWN AEROBIC AND ANAEROBIC Blood Culture adequate volume Performed at [facility], [address] [place], [place] [address]    Culture   Final    NO GROWTH 5 DAYS Performed at [facility], [address] [place], [place] [address]    Report Status 07/05/2023 FINAL  Final  Expectorated Sputum Assessment w Gram Stain, Rflx to Resp Cult     Status: None   Collection Time: 07/02/23  4:43 PM   Specimen: Expectorated Sputum  Result Value Ref Range Status   Specimen Description EXPECTORATED SPUTUM  Final   Special Requests NONE  Final   Sputum evaluation   Final    THIS SPECIMEN IS ACCEPTABLE FOR SPUTUM CULTURE Performed at [facility], [address], [place], [place] [address]    Report Status 07/02/2023 FINAL  Final  Culture, Respiratory w Gram Stain     Status: None   Collection Time: 07/02/23  4:43 PM  Result Value Ref Range Status   Specimen Description   Final    EXPECTORATED SPUTUM Performed  at [facility], [address], [place], [place] [address]    Special Requests   Final  NONE Reflexed from [id number] Performed at [facility], [address] [place], [place] [address]    Gram Stain   Final    FEW WBC PRESENT, PREDOMINANTLY PMN FEW GRAM POSITIVE COCCI RARE YEAST WITH PSEUDOHYPHAE RARE GRAM NEGATIVE RODS Performed at [facility], [address] [place], [place] [address]    Culture   Final    RARE METHICILLIN RESISTANT STAPHYLOCOCCUS AUREUS RARE CANDIDA ALBICANS    Report Status 07/06/2023 FINAL  Final   Organism ID, Bacteria METHICILLIN RESISTANT STAPHYLOCOCCUS AUREUS  Final      Susceptibility   Methicillin resistant staphylococcus aureus - MIC*    CIPROFLOXACIN >=8 RESISTANT Resistant     ERYTHROMYCIN >=8 RESISTANT Resistant     GENTAMICIN <=0.5 SENSITIVE Sensitive     OXACILLIN >=4 RESISTANT Resistant     TETRACYCLINE <=1 SENSITIVE Sensitive     VANCOMYCIN <=0.5 SENSITIVE Sensitive     TRIMETH/SULFA >=320 RESISTANT Resistant     CLINDAMYCIN >=8 RESISTANT Resistant     RIFAMPIN <=0.5 SENSITIVE Sensitive     Inducible Clindamycin NEGATIVE Sensitive     LINEZOLID 2 SENSITIVE Sensitive     * RARE METHICILLIN RESISTANT STAPHYLOCOCCUS AUREUS  Fungus Culture With Stain     Status: None (Preliminary result)   Collection Time: 07/03/23  2:24 PM   Specimen: PATH Cytology Pleural fluid  Result Value Ref Range Status   Fungus Stain Final report  Final    Comment: (NOTE) Performed At: [facility] [address] [place], [place] [id number] Jolene Schimke MD ZH:[id number]    Fungus (Mycology) Culture PENDING  Incomplete   Fungal Source PLEURAL  Final    Comment: Performed at [facility], [address] [place], [place] [address]  Culture, Fungus without Smear     Status: None (Preliminary result)   Collection Time: 07/03/23  2:24 PM   Specimen: PATH Cytology Pleural fluid  Result Value  Ref Range Status   Specimen Description   Final    PLEURAL Performed at [facility], [address] [place], [place] [address]    Special Requests   Final    NONE Performed at [facility], [address] [place], [place] [address]    Culture   Final    NO FUNGUS ISOLATED AFTER 5 DAYS Performed at [facility], [address], [place], [place] [address]    Report Status PENDING  Incomplete  Body fluid culture w Gram Stain     Status: None   Collection Time: 07/03/23  2:24 PM   Specimen: PATH Cytology Pleural fluid  Result Value Ref Range Status   Specimen Description   Final    PLEURAL Performed at [facility], [address] [place], [place] [address]    Special Requests   Final    NONE Performed at [facility], [address], [place], [place] [address]    Gram Stain NO WBC SEEN NO ORGANISMS SEEN   Final   Culture   Final    NO GROWTH 3 DAYS Performed at [facility], [address] [place], [place] [address]    Report Status 07/06/2023 FINAL  Final  Fungus Culture Result     Status: None   Collection Time: 07/03/23  2:24 PM  Result Value Ref Range Status   Result 1 Comment  Final    Comment: (NOTE) KOH/Calcofluor preparation:  no fungus observed. Performed At: [facility] [address] [place], [place] [id number] Jolene Schimke MD YQ:[id number]   Respiratory (~20 pathogens) panel by PCR     Status: None   Collection Time: 07/04/23 10:04 AM   Specimen: Nasopharyngeal Swab; Respiratory  Result Value Ref Range Status   Adenovirus NOT DETECTED NOT DETECTED Final   Coronavirus 229E NOT DETECTED NOT DETECTED Final    Comment: (NOTE) The Coronavirus on the Respiratory Panel, DOES NOT test for the novel  Coronavirus (2019 nCoV)    Coronavirus HKU1 NOT DETECTED NOT DETECTED Final   Coronavirus NL63 NOT DETECTED NOT DETECTED Final   Coronavirus OC43 NOT DETECTED  NOT DETECTED Final   Metapneumovirus NOT DETECTED NOT DETECTED Final   Rhinovirus / Enterovirus NOT DETECTED NOT DETECTED Final   Influenza A NOT DETECTED NOT DETECTED Final   Influenza B NOT DETECTED NOT DETECTED Final   Parainfluenza Virus 1 NOT DETECTED NOT DETECTED Final   Parainfluenza Virus 2 NOT DETECTED NOT DETECTED Final   Parainfluenza Virus 3 NOT DETECTED NOT DETECTED Final   Parainfluenza Virus 4 NOT DETECTED NOT DETECTED Final   Respiratory Syncytial Virus NOT DETECTED NOT DETECTED Final   Bordetella pertussis NOT DETECTED NOT DETECTED Final   Bordetella Parapertussis NOT DETECTED NOT DETECTED Final   Chlamydophila pneumoniae NOT DETECTED NOT DETECTED Final   Mycoplasma pneumoniae NOT DETECTED NOT DETECTED Final    Comment: Performed at [facility], [address], [place], [place] [address]         Radiology Studies: DG Chest Port 1 View Result Date: 07/09/2023 CLINICAL DATA:  [id number] with pleural effusion, left chest tube. EXAM: PORTABLE CHEST 1 VIEW COMPARISON:  Portable chest yesterday at 8:49 a.m. FINDINGS: 5:10 a.m. Low inspiration on exam as before. Left chest tube with pigtail at the mid chest is unchanged in position. There is no measurable pneumothorax. Moderate-to-large left pleural effusion obscures all but the apical third of the left lung. Cardiomediastinal silhouette is stable. There is mild central vascular prominence. No edema. Right lung, apical left lung clear aside from an atelectatic band in the right base. Thoracic spondylosis. Overall aeration seems unchanged.  No new abnormality. IMPRESSION: 1. Stable left chest tube. No measurable pneumothorax. 2. Moderate-to-large left pleural effusion obscures all but the apical third of the left lung. 3. No new abnormality.  Fall Electronically Signed   By: Almira Bar M.D.   On: 07/09/2023 06:21   CT CHEST WO CONTRAST Result Date: 07/08/2023 CLINICAL DATA:  Pleural effusion.  Blood in chest tube. EXAM: CT  CHEST WITHOUT CONTRAST TECHNIQUE: Multidetector CT imaging of the chest was performed following the standard protocol without IV contrast. RADIATION DOSE REDUCTION: This exam was performed according to the departmental dose-optimization program which includes automated exposure control, adjustment of the mA and/or kV according to patient size and/or use of iterative reconstruction technique. COMPARISON:  CT chest 07/04/2023. FINDINGS: Cardiovascular: No significant vascular findings. Normal heart size. No pericardial effusion. Mediastinum/Nodes: Visualized thyroid gland is within normal limits. There is a precarinal mildly enlarged lymph node measuring 11 mm there is an enlarged subcarinal lymph node measuring 11 mm. Difficult to assess for hilar adenopathy. No significant interval change. There is a small hiatal hernia. The esophagus is nondilated. Lungs/Pleura: There is a small left pleural effusion which appears slightly loculated, but is mostly dependent. This has decreased in size in the interval. There is a new chest tube in the anterior left chest. The chest tube is located in the region without pleural  fluid. There is atelectasis and consolidation of the left lower lobe similar to the prior study. There is a trace layering right pleural effusion, unchanged. Atelectasis/airspace disease in the inferior right lower lobe is new from prior. Trachea and central airways are within normal limits. Upper Abdomen: No acute abnormality. Musculoskeletal: No chest wall mass or suspicious bone lesions identified. IMPRESSION: 1. Small left pleural effusion which appears slightly loculated, but is mostly dependent. This has decreased in size in the interval. 2. New anterior left chest tube in place. The chest tube is located in the region without pleural fluid. 3. Stable trace right pleural effusion. 4. Stable atelectasis and consolidation of the left lower lobe. 5. New atelectasis/airspace disease in the inferior right  lower lobe. 6. Stable mild mediastinal adenopathy. Electronically Signed   By: Darliss Cheney M.D.   On: 07/08/2023 15:22   DG Chest Port 1 View Result Date: 07/08/2023 CLINICAL DATA:  Dyspnea.  Follow-up pleural effusion EXAM: PORTABLE CHEST 1 VIEW COMPARISON:  07/07/2023 and older FINDINGS: Underinflation. No pneumothorax or edema. Increasing bandlike opacity right lung base. Favor atelectasis. Persistent left-sided pleural effusion and opacity with pigtail catheter. No pneumothorax. Stable cardiac silhouette. Film is under penetrated. IMPRESSION: No significant interval change. Electronically Signed   By: Karen Kays M.D.   On: 07/08/2023 13:09           LOS: 9 days   Time spent= 35 mins    Miguel Rota, MD [facility]  If 7PM-7AM, please contact night-coverage  07/09/2023, 11:37 AM

## 2023-07-10 ENCOUNTER — Inpatient Hospital Stay (HOSPITAL_COMMUNITY)

## 2023-07-10 ENCOUNTER — Other Ambulatory Visit: Payer: Self-pay | Admitting: Pulmonary Disease

## 2023-07-10 DIAGNOSIS — J90 Pleural effusion, not elsewhere classified: Secondary | ICD-10-CM | POA: Diagnosis not present

## 2023-07-10 DIAGNOSIS — J189 Pneumonia, unspecified organism: Secondary | ICD-10-CM | POA: Diagnosis not present

## 2023-07-10 HISTORY — PX: IR CATHETER TUBE CHANGE: IMG717

## 2023-07-10 LAB — GLUCOSE, CAPILLARY
Glucose-Capillary: 125 mg/dL — ABNORMAL HIGH (ref 70–99)
Glucose-Capillary: 116 mg/dL — ABNORMAL HIGH (ref 70–99)
Glucose-Capillary: 280 mg/dL — ABNORMAL HIGH (ref 70–99)
Glucose-Capillary: 210 mg/dL — ABNORMAL HIGH (ref 70–99)

## 2023-07-10 LAB — CBC
HCT: 32.1 % — ABNORMAL LOW (ref 36.0–46.0)
Hemoglobin: 10.5 g/dL — ABNORMAL LOW (ref 12.0–15.0)
MCH: 30.2 pg (ref 26.0–34.0)
MCHC: 32.7 g/dL (ref 30.0–36.0)
MCV: 92.2 fL (ref 80.0–100.0)
Platelets: 474 10*3/uL — ABNORMAL HIGH (ref 150–400)
RBC: 3.48 MIL/uL — ABNORMAL LOW (ref 3.87–5.11)
RDW: 14.5 % (ref 11.5–15.5)
WBC: 9.4 10*3/uL (ref 4.0–10.5)
nRBC: 0 % (ref 0.0–0.2)

## 2023-07-10 LAB — BASIC METABOLIC PANEL WITH GFR
Anion gap: 11 (ref 5–15)
BUN: 10 mg/dL (ref 8–23)
CO2: 25 mmol/L (ref 22–32)
Calcium: 8.4 mg/dL — ABNORMAL LOW (ref 8.9–10.3)
Chloride: 99 mmol/L (ref 98–111)
Creatinine, Ser: 0.67 mg/dL (ref 0.44–1.00)
GFR, Estimated: >60 mL/min (ref >60)
Glucose, Bld: 144 mg/dL — ABNORMAL HIGH (ref 70–99)
Potassium: 4.3 mmol/L (ref 3.5–5.1)
Sodium: 135 mmol/L (ref 135–145)

## 2023-07-10 LAB — MAGNESIUM: Magnesium: 1.8 mg/dL (ref 1.7–2.4)

## 2023-07-10 MED ORDER — FENTANYL CITRATE (PF) 100 MCG/2ML IJ SOLN
INTRAMUSCULAR | Status: AC | PRN
Start: 2023-07-10 — End: 2023-07-10
  Administered 2023-07-10 (×2): 50 ug via INTRAVENOUS

## 2023-07-10 MED ORDER — FENTANYL CITRATE (PF) 100 MCG/2ML IJ SOLN
INTRAMUSCULAR | Status: AC
  Filled 2023-07-10: qty 2

## 2023-07-10 MED ORDER — MIDAZOLAM HCL 2 MG/2ML IJ SOLN
INTRAMUSCULAR | Status: AC | PRN
Start: 2023-07-10 — End: 2023-07-10
  Administered 2023-07-10 (×3): 1 mg via INTRAVENOUS

## 2023-07-10 MED ORDER — LIDOCAINE HCL 1 % IJ SOLN
INTRAMUSCULAR | Status: AC
  Filled 2023-07-10: qty 20

## 2023-07-10 MED ORDER — MIDAZOLAM HCL 2 MG/2ML IJ SOLN
INTRAMUSCULAR | Status: AC
  Filled 2023-07-10: qty 2

## 2023-07-10 MED ORDER — MIDAZOLAM HCL 2 MG/2ML IJ SOLN
INTRAMUSCULAR | Status: AC
Start: 2023-07-10 — End: ?
  Filled 2023-07-10: qty 2

## 2023-07-10 MED ORDER — LIDOCAINE HCL 1 % IJ SOLN
20.0000 mL | Freq: Once | INTRAMUSCULAR | Status: AC
  Administered 2023-07-10: 10 mL via INTRADERMAL

## 2023-07-10 NOTE — Progress Notes (Signed)
 PROGRESS NOTE    Jade Perez  ZOX:[id number] DOB: [birth year] DOA: 06/30/2023 PCP: Abner Greenspan, MD   Brief Narrative: 68 year old with past medical history significant for hypertension, hyperlipidemia, diabetes type 2, OSA, depression, asthma, non-STEMI with normal coronary arteries, admitted with shortness of breath and cough.  She was diagnosed with left lower pneumonia outpatient on 3/22 but returned due to worsening of chest pain and discomfort.  CTA chest was negative for PE but does show left lower lobe consolidation.  She was started on ceftriaxone and azithromycin.  Unfortunately chest x-ray showed worsening left-sided pleural effusion, thoracentesis [age] year old 320 cc of fluids.  CT was performed subsequently and show concern for loculation, chest tube was placed 3/28.  Patient had received 3 doses of lytic since then.  Repeated CT chest on 4/1 show mild improvement.  3/27-thoracentesis yielding 320 cc 3/28-persistent loculated effusions noted on CT chest and chest tube was placed 3/29, 3/30, 3/31 -received 3 doses of lytics once daily 4/1 -repeat CT chest shows improvement in effusion.  Only small loculations 4/03 went to IR for repositioning of chest tube   Assessment & Plan:   Principal Problem:   CAP (community acquired pneumonia) Active Problems:   Intrinsic asthma   Type 2 diabetes mellitus without complication, without long-term current use of insulin (HCC)   Acute respiratory failure with hypoxia (HCC)   Hypokalemia   Hypertension   Depression   OSA (obstructive sleep apnea)   Hyperlipidemia, mixed   Pleural effusion  1-Left lower lobe community-acquired pneumonia, MRSA pneumonia Left-sided pleural effusion -Patient failed outpatient treatment.  During hospitalization she has also received IV ceftriaxone and azithromycin without significant improvement. -Initial CTA showed left lower lobe consolidation but later repeated chest x-ray showed worsening pleural  effusion requiring thoracentesis 3/27 yielding 320 cc of fluids without significant improvement -Repeated CT chest showed persistent pleural effusion with concern for loculation for which chest tube was placed on 3/28 -Has received 3 doses of lytics -Cultures grew MRSA, therefore patient was started on linezolid -CT chest/1 showed improved fluid and loculation. -Pulmonology is following, recommend IR eval for repositioning of chest tube.  -4/03; went to IR for reposition of chest tube.     2-Hypokalemia/hypophosphatemia: Replaced.   Hyponatremia: Resolved   Essential hypertension: -Continue with Norvasc.   Hyperlipidemia: -Continue with Lipitor.   Diabetes type 2 uncontrolled hyperglycemia: Continue with Amaryl, Semglee and Novolog  History of asthma and OSA: PRN nebulizer.   Depression: Continue with Celexa   Smudge cells - Smudge cells noted on peripheral smear, percentage not specified  - Could be d/t the infection but she will need further workup with flow cytometry if leukocytosis persists  -Leukocytosis has resolved  Estimated body mass index is 36.58 kg/m as calculated from the following:   Height as of this encounter: 5\' 2"  (1.575 m).   Weight as of this encounter: 90.7 kg.   DVT prophylaxis: Lovenox Code Status: Full code Family Communication: Care discussed with patient, and husband  Disposition Plan:  Status is: Inpatient Remains inpatient appropriate because: management of loculated effusion.     Consultants:  CC[facility] IR  Procedures:  Chest tube   Antimicrobials:    Subjective: She just came from IR, feels sleepy. Report pain. Her IV didn't work, nurse removed it   Objective: Vitals:   07/09/23 1143 07/09/23 1944 07/09/23 2008 07/10/23 0501  BP: (!) 111/59 (!) 127/111  139/65  Pulse: 84 86  (!) 58  Resp: 18 18  18   Temp:  98.7 F (37.1 C) 98.4 F (36.9 C)  97.8 F (36.6 C)  TempSrc:  Oral  Oral  SpO2: 99% 100% 99% 97%  Weight:       Height:        Intake/Output Summary (Last 24 hours) at 07/10/2023 0747 Last data filed at 07/10/2023 0459 Gross per 24 hour  Intake 310 ml  Output 90 ml  Net 220 ml   Filed Weights   06/30/23 0046  Weight: 90.7 kg    Examination:  General exam: Appears calm and comfortable  Respiratory system: Clear to auscultation. Respiratory effort normal., chest tube anterior chest  Cardiovascular system: S1 & S2 heard, RRR. Gastrointestinal system: Abdomen is nondistended, soft and nontender. Central nervous system: Alert and oriented. No focal neurological deficits. Extremities: Symmetric 5 x 5 power.   Data Reviewed: I have personally reviewed following labs and imaging studies  CBC: Recent Labs  Lab 07/05/23 0836 07/06/23 0705 07/07/23 0454 07/09/23 0557 07/10/23 0526  WBC 14.0* 17.8* 20.7* 9.6 9.4  HGB 12.2 13.1 13.8 10.7* 10.5*  HCT 38.2 40.2 42.0 31.4* 32.1*  MCV 92.7 91.6 90.3 89.0 92.2  PLT 393 434* 529* 456* 474*   Basic Metabolic Panel: Recent Labs  Lab 07/05/23 0836 07/06/23 0705 07/07/23 0454 07/08/23 1602 07/09/23 0557 07/10/23 0526  NA 135 131* 125* 130* 134* 135  K 4.0 4.1 4.1 4.3 4.2 4.3  CL 100 95* 91* 94* 98 99  CO2 22 24 22 26 28 25   GLUCOSE 155* 173* 242* 249* 127* 144*  BUN 14 14 14 13 10 10   CREATININE 0.62 0.76 0.71 0.73 0.63 0.67  CALCIUM 8.8* 8.8* 8.5* 8.4* 8.3* 8.4*  MG 1.7 1.8 1.8  --  1.9 1.8   GFR: Estimated Creatinine Clearance: 69.5 mL/min (by C-G formula based on SCr of 0.67 mg/dL). Liver Function Tests: No results for input(s): "AST", "ALT", "ALKPHOS", "BILITOT", "PROT", "ALBUMIN" in the last 168 hours. No results for input(s): "LIPASE", "AMYLASE" in the last 168 hours. No results for input(s): "AMMONIA" in the last 168 hours. Coagulation Profile: Recent Labs  Lab 07/04/23 1342  INR 1.9*   Cardiac Enzymes: No results for input(s): "CKTOTAL", "CKMB", "CKMBINDEX", "TROPONINI" in the last 168 hours. BNP (last 3 results) No  results for input(s): "PROBNP" in the last 8760 hours. HbA1C: No results for input(s): "HGBA1C" in the last 72 hours. CBG: Recent Labs  Lab 07/08/23 2059 07/09/23 0805 07/09/23 1141 07/09/23 1646 07/09/23 2030  GLUCAP 226* 117* 231* 111* 178*   Lipid Profile: No results for input(s): "CHOL", "HDL", "LDLCALC", "TRIG", "CHOLHDL", "LDLDIRECT" in the last 72 hours. Thyroid Function Tests: No results for input(s): "TSH", "T4TOTAL", "FREET4", "T3FREE", "THYROIDAB" in the last 72 hours. Anemia Panel: No results for input(s): "VITAMINB12", "FOLATE", "FERRITIN", "TIBC", "IRON", "RETICCTPCT" in the last 72 hours. Sepsis Labs: Recent Labs  Lab 07/05/23 0836  PROCALCITON 0.78    Recent Results (from the past 240 hours)  Expectorated Sputum Assessment w Gram Stain, Rflx to Resp Cult     Status: None   Collection Time: 07/02/23  4:43 PM   Specimen: Expectorated Sputum  Result Value Ref Range Status   Specimen Description EXPECTORATED SPUTUM  Final   Special Requests NONE  Final   Sputum evaluation   Final    THIS SPECIMEN IS ACCEPTABLE FOR SPUTUM CULTURE Performed at [facility], [address], [place], [place] [address]    Report Status 07/02/2023 FINAL  Final  Culture, Respiratory w Gram Stain  Status: None   Collection Time: 07/02/23  4:43 PM  Result Value Ref Range Status   Specimen Description   Final    EXPECTORATED SPUTUM Performed at [facility], [address] [place], [place] [address]    Special Requests   Final    NONE Reflexed from [id number] Performed at [facility], [address] [place], [place] [address]    Gram Stain   Final    FEW WBC PRESENT, PREDOMINANTLY PMN FEW GRAM POSITIVE COCCI RARE YEAST WITH PSEUDOHYPHAE RARE GRAM NEGATIVE RODS Performed at [facility], [address] [place], [place] [address]    Culture   Final    RARE METHICILLIN RESISTANT STAPHYLOCOCCUS AUREUS RARE  CANDIDA ALBICANS    Report Status 07/06/2023 FINAL  Final   Organism ID, Bacteria METHICILLIN RESISTANT STAPHYLOCOCCUS AUREUS  Final      Susceptibility   Methicillin resistant staphylococcus aureus - MIC*    CIPROFLOXACIN >=8 RESISTANT Resistant     ERYTHROMYCIN >=8 RESISTANT Resistant     GENTAMICIN <=0.5 SENSITIVE Sensitive     OXACILLIN >=4 RESISTANT Resistant     TETRACYCLINE <=1 SENSITIVE Sensitive     VANCOMYCIN <=0.5 SENSITIVE Sensitive     TRIMETH/SULFA >=320 RESISTANT Resistant     CLINDAMYCIN >=8 RESISTANT Resistant     RIFAMPIN <=0.5 SENSITIVE Sensitive     Inducible Clindamycin NEGATIVE Sensitive     LINEZOLID 2 SENSITIVE Sensitive     * RARE METHICILLIN RESISTANT STAPHYLOCOCCUS AUREUS  Fungus Culture With Stain     Status: None (Preliminary result)   Collection Time: 07/03/23  2:24 PM   Specimen: PATH Cytology Pleural fluid  Result Value Ref Range Status   Fungus Stain Final report  Final    Comment: (NOTE) Performed At: [facility] [address] [place], [place] [id number] Jolene Schimke MD VH:[id number]    Fungus (Mycology) Culture PENDING  Incomplete   Fungal Source PLEURAL  Final    Comment: Performed at [facility], [address] [place], [place] [address]  Culture, Fungus without Smear     Status: None (Preliminary result)   Collection Time: 07/03/23  2:24 PM   Specimen: PATH Cytology Pleural fluid  Result Value Ref Range Status   Specimen Description   Final    PLEURAL Performed at [facility], [address] [place], [place] [address]    Special Requests   Final    NONE Performed at [facility], [address], [place], [place] [address]    Culture   Final    NO FUNGUS ISOLATED AFTER 6 DAYS Performed at [facility], [address] [place], [place] [address]    Report Status PENDING  Incomplete  Body fluid culture w Gram Stain     Status: None   Collection Time:  07/03/23  2:24 PM   Specimen: PATH Cytology Pleural fluid  Result Value Ref Range Status   Specimen Description   Final    PLEURAL Performed at [facility], [address], [place], [place] [address]    Special Requests   Final    NONE Performed at [facility], [address], [place], [place] [address]    Gram Stain NO WBC SEEN NO ORGANISMS SEEN   Final   Culture   Final    NO GROWTH 3 DAYS Performed at [facility], [address] [place], [place] [address]  Report Status 07/06/2023 FINAL  Final  Fungus Culture Result     Status: None   Collection Time: 07/03/23  2:24 PM  Result Value Ref Range Status   Result 1 Comment  Final    Comment: (NOTE) KOH/Calcofluor preparation:  no fungus observed. Performed At: [facility] [address] [place], [place] [id number] Jolene Schimke MD WU:[id number]   Respiratory (~20 pathogens) panel by PCR     Status: None   Collection Time: 07/04/23 10:04 AM   Specimen: Nasopharyngeal Swab; Respiratory  Result Value Ref Range Status   Adenovirus NOT DETECTED NOT DETECTED Final   Coronavirus 229E NOT DETECTED NOT DETECTED Final    Comment: (NOTE) The Coronavirus on the Respiratory Panel, DOES NOT test for the novel  Coronavirus (2019 nCoV)    Coronavirus HKU1 NOT DETECTED NOT DETECTED Final   Coronavirus NL63 NOT DETECTED NOT DETECTED Final   Coronavirus OC43 NOT DETECTED NOT DETECTED Final   Metapneumovirus NOT DETECTED NOT DETECTED Final   Rhinovirus / Enterovirus NOT DETECTED NOT DETECTED Final   Influenza A NOT DETECTED NOT DETECTED Final   Influenza B NOT DETECTED NOT DETECTED Final   Parainfluenza Virus 1 NOT DETECTED NOT DETECTED Final   Parainfluenza Virus 2 NOT DETECTED NOT DETECTED Final   Parainfluenza Virus 3 NOT DETECTED NOT DETECTED Final   Parainfluenza Virus 4 NOT DETECTED NOT DETECTED Final   Respiratory Syncytial Virus NOT DETECTED NOT DETECTED Final    Bordetella pertussis NOT DETECTED NOT DETECTED Final   Bordetella Parapertussis NOT DETECTED NOT DETECTED Final   Chlamydophila pneumoniae NOT DETECTED NOT DETECTED Final   Mycoplasma pneumoniae NOT DETECTED NOT DETECTED Final    Comment: Performed at [facility], [address], [place], [place] [address]         Radiology Studies: DG Chest Port 1 View Result Date: 07/09/2023 CLINICAL DATA:  [id number] with pleural effusion, left chest tube. EXAM: PORTABLE CHEST 1 VIEW COMPARISON:  Portable chest yesterday at 8:49 a.m. FINDINGS: 5:10 a.m. Low inspiration on exam as before. Left chest tube with pigtail at the mid chest is unchanged in position. There is no measurable pneumothorax. Moderate-to-large left pleural effusion obscures all but the apical third of the left lung. Cardiomediastinal silhouette is stable. There is mild central vascular prominence. No edema. Right lung, apical left lung clear aside from an atelectatic band in the right base. Thoracic spondylosis. Overall aeration seems unchanged.  No new abnormality. IMPRESSION: 1. Stable left chest tube. No measurable pneumothorax. 2. Moderate-to-large left pleural effusion obscures all but the apical third of the left lung. 3. No new abnormality.  Fall Electronically Signed   By: Almira Bar M.D.   On: 07/09/2023 06:21   CT CHEST WO CONTRAST Result Date: 07/08/2023 CLINICAL DATA:  Pleural effusion.  Blood in chest tube. EXAM: CT CHEST WITHOUT CONTRAST TECHNIQUE: Multidetector CT imaging of the chest was performed following the standard protocol without IV contrast. RADIATION DOSE REDUCTION: This exam was performed according to the departmental dose-optimization program which includes automated exposure control, adjustment of the mA and/or kV according to patient size and/or use of iterative reconstruction technique. COMPARISON:  CT chest 07/04/2023. FINDINGS: Cardiovascular: No significant vascular findings. Normal heart size. No  pericardial effusion. Mediastinum/Nodes: Visualized thyroid gland is within normal limits. There is a precarinal mildly enlarged lymph node measuring 11 mm there is an enlarged subcarinal lymph node measuring 11 mm. Difficult to assess for hilar adenopathy. No significant interval change. There is a small  hiatal hernia. The esophagus is nondilated. Lungs/Pleura: There is a small left pleural effusion which appears slightly loculated, but is mostly dependent. This has decreased in size in the interval. There is a new chest tube in the anterior left chest. The chest tube is located in the region without pleural fluid. There is atelectasis and consolidation of the left lower lobe similar to the prior study. There is a trace layering right pleural effusion, unchanged. Atelectasis/airspace disease in the inferior right lower lobe is new from prior. Trachea and central airways are within normal limits. Upper Abdomen: No acute abnormality. Musculoskeletal: No chest wall mass or suspicious bone lesions identified. IMPRESSION: 1. Small left pleural effusion which appears slightly loculated, but is mostly dependent. This has decreased in size in the interval. 2. New anterior left chest tube in place. The chest tube is located in the region without pleural fluid. 3. Stable trace right pleural effusion. 4. Stable atelectasis and consolidation of the left lower lobe. 5. New atelectasis/airspace disease in the inferior right lower lobe. 6. Stable mild mediastinal adenopathy. Electronically Signed   By: Darliss Cheney M.D.   On: 07/08/2023 15:22   DG Chest Port 1 View Result Date: 07/08/2023 CLINICAL DATA:  Dyspnea.  Follow-up pleural effusion EXAM: PORTABLE CHEST 1 VIEW COMPARISON:  07/07/2023 and older FINDINGS: Underinflation. No pneumothorax or edema. Increasing bandlike opacity right lung base. Favor atelectasis. Persistent left-sided pleural effusion and opacity with pigtail catheter. No pneumothorax. Stable cardiac  silhouette. Film is under penetrated. IMPRESSION: No significant interval change. Electronically Signed   By: Karen Kays M.D.   On: 07/08/2023 13:09        Scheduled Meds:  amLODipine  5 mg Oral Daily   arformoterol  15 mcg Nebulization BID   atorvastatin  40 mg Oral QHS   citalopram  20 mg Oral Daily   [START ON 07/11/2023] enoxaparin (LOVENOX) injection  40 mg Subcutaneous Q24H   fenofibrate  160 mg Oral Daily   ferrous sulfate  325 mg Oral Q breakfast   glimepiride  0.5 mg Oral BID   guaiFENesin  10 mL Oral Q6H   insulin aspart  0-5 Units Subcutaneous QHS   insulin aspart  0-6 Units Subcutaneous TID WC   insulin aspart  3 Units Subcutaneous TID WC   insulin glargine-yfgn  14 Units Subcutaneous Daily   lidocaine  2 patch Transdermal Q24H   montelukast  10 mg Oral QHS   pantoprazole  40 mg Oral Daily   revefenacin  175 mcg Nebulization Daily   sodium chloride flush  10 mL Intrapleural Q8H   sodium chloride flush  3 mL Intravenous Q12H   Continuous Infusions:  linezolid (ZYVOX) IV 600 mg (07/09/23 2136)     LOS: 10 days    Time spent: 35 minutes    Brantleigh Mifflin A Janayla Marik, MD [facility]   If 7PM-7AM, please contact night-coverage [email or web address]  07/10/2023, 7:47 AM

## 2023-07-10 NOTE — Progress Notes (Signed)
 Will monitor clinically  Had CT surgery weigh in  If no significant effluent in next day, plan for a CXR in AM, will plan to discontinue tube one effluent decreases

## 2023-07-10 NOTE — Plan of Care (Signed)

## 2023-07-10 NOTE — Plan of Care (Addendum)
 VSS. Patient c/o left chest pain (chest tube site), given PRN Flexeril and Tylenol. BG 178 at bedtime. NPO since midnight for IR procedure. Husband remains at bedside. No acute events overnight.  Problem: Education: Goal: Knowledge of General Education information will improve Description: Including pain rating scale, medication(s)/side effects and non-pharmacologic comfort measures Outcome: Progressing   Problem: Clinical Measurements: Goal: Ability to maintain clinical measurements within normal limits will improve Outcome: Progressing Goal: Will remain free from infection Outcome: Progressing   Problem: Pain Managment: Goal: General experience of comfort will improve and/or be controlled Outcome: Progressing   Problem: Activity: Goal: Ability to tolerate increased activity will improve Outcome: Progressing   Problem: Respiratory: Goal: Ability to maintain adequate ventilation will improve Outcome: Progressing   Problem: Fluid Volume: Goal: Ability to maintain a balanced intake and output will improve Outcome: Progressing   Problem: Metabolic: Goal: Ability to maintain appropriate glucose levels will improve Outcome: Progressing   Problem: Nutritional: Goal: Maintenance of adequate nutrition will improve Outcome: Progressing

## 2023-07-10 NOTE — Progress Notes (Signed)
  Post placement of a new chest tube for 06/26/2023 -Currently misdirected more apically  -She is s/p intrapleural lytics 3/29, 3/30, 3/31  Still has basal loculated effusion  Will have cardiothoracic surgery weigh in  Continue to monitor chest tube output at present  Optimal dosing of intrapleural lytics is about 3 days, may be extended if felt not to be a surgical candidate for one more dose

## 2023-07-10 NOTE — Progress Notes (Signed)
 Patient ID: Jade Perez, female   DOB: 05-Nov-1955, 68 y.o.   MRN: [id number]    Referring Physician(s): Kalman Shan, MD   Supervising Physician: Oley Balm  Patient Status:  [facility] - In-pt  Chief Complaint:   Follow up left chest tube placement 07/04/23 in IR   Subjective:  Pt doing well, still on O2 and intermittent coughing with deep breaths. Continues to have consistent output from left anterior chest tube. Had repeat CT scan 07/08/23 showing left base loculated pleural effusion. Critical care/pulmonology believe pt would benefit from manipulation of current chest tube or second left sided chest tube placement for further fluid drainage.  Allergies: Amoxicillin, Aspirin, Bee venom, Clarithromycin, Erythromycin, Ibuprofen, Oxycodone, Sulfa antibiotics, Latex, and Tape  Medications: Prior to Admission medications   Medication Sig Start Date End Date Taking? Authorizing Provider  acetaminophen (TYLENOL) 500 MG tablet Take 500 mg by mouth every 6 (six) hours as needed for mild pain.   Yes [provider]  albuterol (PROAIR HFA) 108 (90 Base) MCG/ACT inhaler Can inhale two puffs every four to six hours as needed for cough or wheeze. 06/08/20  Yes Kozlow, Alvira Philips, MD  amLODipine (NORVASC) 5 MG tablet Take 1 tablet (5 mg total) by mouth daily. Patient taking differently: Take 5 mg by mouth daily. 12/16/21  Yes Regalado, Belkys A, MD  atorvastatin (LIPITOR) 40 MG tablet Take 40 mg by mouth at bedtime.   Yes [provider]  b complex vitamins tablet Take 1 tablet by mouth at bedtime.   Yes [provider]  cephALEXin (KEFLEX) 500 MG capsule Take 1 capsule (500 mg total) by mouth 3 (three) times daily. 06/29/23  Yes Delo, Riley Lam, MD  cetirizine (ZYRTEC) 10 MG tablet Take 10 mg by mouth daily.   Yes [provider]  citalopram (CELEXA) 40 MG tablet Take 20 mg by mouth daily.   Yes [provider]  FARXIGA 10 MG TABS tablet Take 10 mg by  mouth daily. 05/21/23  Yes [provider]  fenofibrate (TRICOR) 145 MG tablet Take 145 mg by mouth at bedtime. 09/22/19  Yes [provider]  Ferrous Sulfate (SLOW FE PO) Take 1 tablet by mouth daily.   Yes [provider]  glimepiride (AMARYL) 1 MG tablet Take 0.5 mg by mouth 2 (two) times daily. 02/15/19  Yes [provider]  indapamide (LOZOL) 1.25 MG tablet Take 1.25 mg by mouth daily. 03/10/23  Yes [provider]  montelukast (SINGULAIR) 10 MG tablet TAKE 1 TABLET BY MOUTH EVERY DAY Patient taking differently: Take 10 mg by mouth at bedtime. 07/19/19  Yes Kozlow, Alvira Philips, MD  nystatin cream (MYCOSTATIN) Apply 1 Application topically as needed for dry skin (itching, burning, redness). 05/02/23  Yes [provider]  omeprazole (PRILOSEC) 40 MG capsule TAKE 1 CAPSULE BY MOUTH EVERY DAY IN THE MORNING BEFORE BREAKFAST Patient taking differently: Take 40 mg by mouth daily. 07/30/21  Yes Kozlow, Alvira Philips, MD  Semaglutide, 1 MG/DOSE, 2 MG/1.5ML SOPN Inject 2 mg into the skin once a week. Wednesday's 05/25/20  Yes [provider]     Vital Signs: BP 139/65 (BP Location: Left Arm)   Pulse 72   Temp 97.8 F (36.6 C) (Oral)   Resp 18   Ht 5\' 2"  (1.575 m)   Wt 200 lb (90.7 kg)   SpO2 95%   BMI 36.58 kg/m   Physical Exam Vitals and nursing note reviewed.  Constitutional:      Appearance:  Normal appearance. She is not ill-appearing or toxic-appearing.  HENT:     Mouth/Throat:     Mouth: Mucous membranes are moist.     Pharynx: Oropharynx is clear.  Cardiovascular:     Rate and Rhythm: Normal rate and regular rhythm.  Pulmonary:     Comments: Continues to be on [place] O2. Diminished breath sounds in left lower lobe. Crackles seem improved from prior exams Abdominal:     Palpations: Abdomen is soft.     Tenderness: There is no abdominal tenderness.  Musculoskeletal:     Right lower leg: No edema.     Left lower leg: No edema.  Skin:     General: Skin is warm and dry.  Neurological:     General: No focal deficit present.     Mental Status: She is alert and oriented to person, place, and time. Mental status is at baseline.     Imaging: DG Chest Port 1 View Result Date: 07/09/2023 CLINICAL DATA:  [id number] with pleural effusion, left chest tube. EXAM: PORTABLE CHEST 1 VIEW COMPARISON:  Portable chest yesterday at 8:49 a.m. FINDINGS: 5:10 a.m. Low inspiration on exam as before. Left chest tube with pigtail at the mid chest is unchanged in position. There is no measurable pneumothorax. Moderate-to-large left pleural effusion obscures all but the apical third of the left lung. Cardiomediastinal silhouette is stable. There is mild central vascular prominence. No edema. Right lung, apical left lung clear aside from an atelectatic band in the right base. Thoracic spondylosis. Overall aeration seems unchanged.  No new abnormality. IMPRESSION: 1. Stable left chest tube. No measurable pneumothorax. 2. Moderate-to-large left pleural effusion obscures all but the apical third of the left lung. 3. No new abnormality.  Fall Electronically Signed   By: Almira Bar M.D.   On: 07/09/2023 06:21   CT CHEST WO CONTRAST Result Date: 07/08/2023 CLINICAL DATA:  Pleural effusion.  Blood in chest tube. EXAM: CT CHEST WITHOUT CONTRAST TECHNIQUE: Multidetector CT imaging of the chest was performed following the standard protocol without IV contrast. RADIATION DOSE REDUCTION: This exam was performed according to the departmental dose-optimization program which includes automated exposure control, adjustment of the mA and/or kV according to patient size and/or use of iterative reconstruction technique. COMPARISON:  CT chest 07/04/2023. FINDINGS: Cardiovascular: No significant vascular findings. Normal heart size. No pericardial effusion. Mediastinum/Nodes: Visualized thyroid gland is within normal limits. There is a precarinal mildly enlarged lymph node measuring 11  mm there is an enlarged subcarinal lymph node measuring 11 mm. Difficult to assess for hilar adenopathy. No significant interval change. There is a small hiatal hernia. The esophagus is nondilated. Lungs/Pleura: There is a small left pleural effusion which appears slightly loculated, but is mostly dependent. This has decreased in size in the interval. There is a new chest tube in the anterior left chest. The chest tube is located in the region without pleural fluid. There is atelectasis and consolidation of the left lower lobe similar to the prior study. There is a trace layering right pleural effusion, unchanged. Atelectasis/airspace disease in the inferior right lower lobe is new from prior. Trachea and central airways are within normal limits. Upper Abdomen: No acute abnormality. Musculoskeletal: No chest wall mass or suspicious bone lesions identified. IMPRESSION: 1. Small left pleural effusion which appears slightly loculated, but is mostly dependent. This has decreased in size in the interval. 2. New anterior left chest tube in place. The chest tube is located in the region without pleural fluid.  3. Stable trace right pleural effusion. 4. Stable atelectasis and consolidation of the left lower lobe. 5. New atelectasis/airspace disease in the inferior right lower lobe. 6. Stable mild mediastinal adenopathy. Electronically Signed   By: Darliss Cheney M.D.   On: 07/08/2023 15:22   DG Chest Port 1 View Result Date: 07/08/2023 CLINICAL DATA:  Dyspnea.  Follow-up pleural effusion EXAM: PORTABLE CHEST 1 VIEW COMPARISON:  07/07/2023 and older FINDINGS: Underinflation. No pneumothorax or edema. Increasing bandlike opacity right lung base. Favor atelectasis. Persistent left-sided pleural effusion and opacity with pigtail catheter. No pneumothorax. Stable cardiac silhouette. Film is under penetrated. IMPRESSION: No significant interval change. Electronically Signed   By: Karen Kays M.D.   On: 07/08/2023 13:09   DG  Chest Port 1 View Result Date: 07/07/2023 CLINICAL DATA:  Pleural effusion. EXAM: PORTABLE CHEST 1 VIEW COMPARISON:  X-ray 07/06/2023 and older FINDINGS: Stable left-sided pigtail catheter. Decreasing left effusion with significant residual. Associated parenchymal opacity. Decreasing right basilar atelectasis. No pneumothorax or edema. Stable cardiopericardial silhouette. Film is under penetrated. Degenerative changes along the spine IMPRESSION: Decreasing left pleural effusion with significant residual. No pneumothorax Electronically Signed   By: Karen Kays M.D.   On: 07/07/2023 11:14    Labs:  CBC: Recent Labs    07/06/23 0705 07/07/23 0454 07/09/23 0557 07/10/23 0526  WBC 17.8* 20.7* 9.6 9.4  HGB 13.1 13.8 10.7* 10.5*  HCT 40.2 42.0 31.4* 32.1*  PLT 434* 529* 456* 474*    COAGS: Recent Labs    07/04/23 1342  INR 1.9*    BMP: Recent Labs    07/07/23 0454 07/08/23 1602 07/09/23 0557 07/10/23 0526  NA 125* 130* 134* 135  K 4.1 4.3 4.2 4.3  CL 91* 94* 98 99  CO2 22 26 28 25   GLUCOSE 242* 249* 127* 144*  BUN 14 13 10 10   CALCIUM 8.5* 8.4* 8.3* 8.4*  CREATININE 0.71 0.73 0.63 0.67  GFRNONAA >60 >60 >60 >60    LIVER FUNCTION TESTS: Recent Labs    06/28/23 2308  BILITOT 0.9  AST 31  ALT 34  ALKPHOS 41  PROT 8.1  ALBUMIN 4.3    Assessment and Plan:  Pt doing well, continuing to have consistent output from left anterior chest tube that was placed 07/04/23.  -received pleural lytic therapy x3 with critical care/pulmonology - output over last day from left anterior chest tube  Repeat CT scan 07/08/23 shows left base loculated pleural effusion.  Critical care/pulmonology re-consulted IR for manipulation of current chest tube vs placing second left sided chest tube for drainage of fluid in lower lung field.  Risks and benefits of chest tube placement vs second chest tube placement were discussed with the patient including bleeding, infection, damage to  adjacent structures, malfunction of the tube requiring additional procedures and sepsis.  All of the patient's questions were answered, patient is agreeable to proceed. Consent signed and in chart.     Electronically Signed: Katheren Puller, PA-C 07/10/2023, 9:49 AM   I spent a total of 25 Minutes at the the patient's bedside AND on the patient's hospital floor or unit, greater than 50% of which was counseling/coordinating care for left sided chest tube.

## 2023-07-10 NOTE — Progress Notes (Signed)
   07/10/23 1945  BiPAP/CPAP/SIPAP  BiPAP/CPAP/SIPAP Pt Type Adult  Reason BIPAP/CPAP not in use Non-compliant (equipment remains at bedside should she change her mind)  BiPAP/CPAP /SiPAP Vitals  SpO2 98 %  Bilateral Breath Sounds Clear;Diminished

## 2023-07-10 NOTE — Progress Notes (Signed)
 PT Cancellation Note  Patient Details Name: Jade Perez MRN: [id number] DOB: 09-18-1955   Cancelled Treatment:    Reason Eval/Treat Not Completed: Patient at procedure or test/unavailable (IR)   [name] 07/10/2023, 11:25 AM [name], DPT Physical Therapist Acute Rehabilitation Services Office: [phone]

## 2023-07-10 NOTE — Progress Notes (Signed)
 NAME:  Jade Perez, MRN:  [id number], DOB:  Sep 04, 1955, LOS: 10 ADMISSION DATE:  06/30/2023, CONSULTATION DATE:  07/04/2023 REFERRING MD:  DR Nelson Chimes, CHIEF COMPLAINT:  LLL white out    History of Present Illness:   68 year old female with past medical history of non-smoking, hypertension, hyperlipidemia, type 2 diabetes, OSA, depression, asthma not otherwise specified presented on 06/30/2023 with cough worsening dyspnea and pleuritic chest pain on the left lower lobe. PRior to that had sick resp contact with grandchild who live with her and then she got sick  CT chest showed left lower lobe consolidation with a small pleural effusion [personal visualization of the CCM MD on this consult] she was hypoxemic requiring oxygen.  She had leukocytosis with white count 20,100.  Pulmonary embolism was ruled out started on Treatment.  Subsequently urine strep and Legionella negative.  Respiratory virus panel for COVID, flu and RSV negative [20 panel not done].  Since admission has been afebrile.  Has improving leukocytosis from 20,002 currently 14,600.  Cultures remain negative.  On 07/03/2023 underwent left-sided thoracentesis removing 350 cc by interventional radiology.  This shows a simple exudate with LDH 633 and acute neutrophilic leukocytosis with polymorphs of 86%.  No eosinophils.  Despite all these interventions has remained on 4 L nasal cannula.  She has been covered by IV antibiotics with ceftriaxone and azithromycin since admission and due to and on 07/04/2023 [facility] have PCCM consult].  Of note prior to admission she was treated as outpatient with antibiotics and then failed that.   Pulmonary is now been consulted with a question of residual loculated effusion  Past Medical History:    has a past medical history of Allergic rhinitis, Asthma, mild intermittent, Complication of anesthesia, Depression, GAD (generalized anxiety disorder), GERD (gastroesophageal reflux disease), Hiatal hernia,  History of cardiomyopathy in adulthood (12/06/2021), History of heart murmur in childhood, History of kidney stones, History of non-ST elevation myocardial infarction (NSTEMI) (12/06/2021), Hyperlipidemia, mixed, Hypertension, IDA (iron deficiency anemia), OA (osteoarthritis), OSA (obstructive sleep apnea), Renal disorder, Type 2 diabetes mellitus (HCC) (2008), and Wears glasses.   reports that she has never smoked. She has never used smokeless tobacco.   Significant Hospital Events:  06/30/2023 - admit -> CT angiogram rule out pulmonary embolism.  Small left-sided pleural effusion.  Significant left lower lobe consolidation 07/03/2023: Worsening chest x-ray with significant whiteout on the left side.  S/p thoracentesis by IR draining only 320 cc.  Simple exudate -> post [place]ora chest x-ray without change 3/28 chest tube placed via IR 3/29 1st dose lytics after IR was able to troubleshoot tube after would not flush or aspirate  3/30 2nd dose of tube lytics 3/31 650 CT outpt; repeat 3rd dose lytics today 4/1 repeat chest CT 4/3 went for IR repositioning of CT  Interim History / Subjective:  90 ml of output from CT.  S/p IR repositioning of CT.  C/o of ongoing pain in left chest/ shoulder blade area.  No increased SOB or cough  Objective   Blood pressure (!) 100/58, pulse 77, temperature 98.3 F (36.8 C), temperature source Oral, resp. rate 18, height 5\' 2"  (1.575 m), weight 90.7 kg, SpO2 97%.        Intake/Output Summary (Last 24 hours) at 07/10/2023 1614 Last data filed at 07/10/2023 0835 Gross per 24 hour  Intake 290 ml  Output 20 ml  Net 270 ml   Filed Weights   06/30/23 0046  Weight: 90.7 kg    Examination:  General:  Pleasant older, obese female sitting upright in bed in NAD, sleepy HEENT: MM pink/moist Neuro: Intermittently nodding off but otherwise easily awakens, appropriate CV: rr PULM:  non labored, more diminished on left with end exp rub, R clear, left anterior pigtail to  20 cm sxn, no tidaling or airleak, 80ml of serosang drainage Extremities: warm/dry, no LE edema    Resolved Hospital Problem list   N/a  Assessment & Plan:   Parapneumonic effusion:  - Initial thoracentesis revealed exudative effusion PMN predominant.   - IR CT guided CT placed 3/28 with appearance, development of loculated enlarging pleural effusion s/p intrapleural lytics 3/29, 3/30, and 3/31 MRSA LLL PNA Acute respiratory failure w/ hypoxia - repeat CT chest 4/1 reveals small dependent left pleural effusion, slightly loculated, decreased in size. Present anterior left CT not in region of remaining pleural fluid.  Otherwise stable atelectasis and consolidation of LLL, new RLL atelectasis, trace right pleural effusion unchanged Plan: - CXR now and assess daily.  Monitor CT output closely  - resend pleural fluid for cytology and culture after repositioning to different pocket - initial pleural fluid remains no growth to date, follow - cont supplemental prn O2 for sat goal > 92% - cont linezolid per primary, plans for 7 days of therapy  - multimodal pain management per primary team  -pulm hygiene, mobilize  - PCCM will continue to follow   Best practice (daily eval):  Per primary         Posey Boyer, MSN, AG-ACNP-BC [facility] 07/10/2023, 4:14 PM  See Amion for pager If no response to pager , please call [phone] until 7pm After 7:00 pm call Elink  [phone]

## 2023-07-11 ENCOUNTER — Telehealth: Payer: Self-pay | Admitting: Pulmonary Disease

## 2023-07-11 ENCOUNTER — Inpatient Hospital Stay (HOSPITAL_COMMUNITY)

## 2023-07-11 DIAGNOSIS — J90 Pleural effusion, not elsewhere classified: Secondary | ICD-10-CM | POA: Diagnosis not present

## 2023-07-11 DIAGNOSIS — J918 Pleural effusion in other conditions classified elsewhere: Secondary | ICD-10-CM | POA: Diagnosis not present

## 2023-07-11 DIAGNOSIS — J189 Pneumonia, unspecified organism: Secondary | ICD-10-CM | POA: Diagnosis not present

## 2023-07-11 LAB — GLUCOSE, CAPILLARY
Glucose-Capillary: 138 mg/dL — ABNORMAL HIGH (ref 70–99)
Glucose-Capillary: 257 mg/dL — ABNORMAL HIGH (ref 70–99)
Glucose-Capillary: 182 mg/dL — ABNORMAL HIGH (ref 70–99)
Glucose-Capillary: 208 mg/dL — ABNORMAL HIGH (ref 70–99)

## 2023-07-11 LAB — BASIC METABOLIC PANEL WITH GFR
Anion gap: 7 (ref 5–15)
BUN: 9 mg/dL (ref 8–23)
CO2: 27 mmol/L (ref 22–32)
Calcium: 8.4 mg/dL — ABNORMAL LOW (ref 8.9–10.3)
Chloride: 101 mmol/L (ref 98–111)
Creatinine, Ser: 0.6 mg/dL (ref 0.44–1.00)
GFR, Estimated: >60 mL/min (ref >60)
Glucose, Bld: 139 mg/dL — ABNORMAL HIGH (ref 70–99)
Potassium: 4.1 mmol/L (ref 3.5–5.1)
Sodium: 135 mmol/L (ref 135–145)

## 2023-07-11 LAB — MAGNESIUM: Magnesium: 1.9 mg/dL (ref 1.7–2.4)

## 2023-07-11 LAB — CBC
HCT: 29.2 % — ABNORMAL LOW (ref 36.0–46.0)
Hemoglobin: 9.7 g/dL — ABNORMAL LOW (ref 12.0–15.0)
MCH: 29.6 pg (ref 26.0–34.0)
MCHC: 33.2 g/dL (ref 30.0–36.0)
MCV: 89 fL (ref 80.0–100.0)
Platelets: 478 10*3/uL — ABNORMAL HIGH (ref 150–400)
RBC: 3.28 MIL/uL — ABNORMAL LOW (ref 3.87–5.11)
RDW: 14.6 % (ref 11.5–15.5)
WBC: 8.2 10*3/uL (ref 4.0–10.5)
nRBC: 0 % (ref 0.0–0.2)

## 2023-07-11 MED ORDER — LINEZOLID 600 MG PO TABS
600.0000 mg | ORAL_TABLET | Freq: Two times a day (BID) | ORAL | Status: DC
Start: 2023-07-11 — End: 2023-07-12
  Administered 2023-07-11 – 2023-07-12 (×2): 600 mg via ORAL
  Filled 2023-07-11 (×2): qty 1

## 2023-07-11 NOTE — Plan of Care (Signed)

## 2023-07-11 NOTE — Telephone Encounter (Signed)
 Patient with parapneumonic effusion  Likely will be discharged over this weekend 07/12/2023  Needs office appointment in 2 to 3 weeks  Should get a chest x-ray on the day of the visit

## 2023-07-11 NOTE — Progress Notes (Signed)
 Occupational Therapy Treatment Patient Details Name: Jade Perez MRN: [id number] DOB: 12/20/1955 Today's Date: 07/11/2023   History of present illness 68 yo female admitted with PNA. Hx of DM, OSA, depression, asthma, NSTEMI, RCR   OT comments  Pt making good progress with functional goals. HHA to walk to bathroom as pt reports that she felt unsteady initially upon standing form chair. Pt transferred to toilet and shower with Sup using grab bars for safety, stood at sink for grooming and ADL tasks with Sup. On RA O2 SATs 96% prior to activity in room, 92% after activity seated in recliner, HR stable throughout.  OT will continue to follow acutely to maximize level of function and safety      If plan is discharge home, recommend the following:  A little help with bathing/dressing/bathroom;Assistance with cooking/housework;Assist for transportation   Equipment Recommendations       Recommendations for Other Services      Precautions / Restrictions Precautions Precautions: Fall Precaution/Restrictions Comments: monitor O2, chest tube Restrictions Weight Bearing Restrictions Per Provider Order: No       Mobility Bed Mobility               General bed mobility comments: pt in chair    Transfers Overall transfer level: Needs assistance Equipment used: 1 person hand held assist, None   Sit to Stand: Supervision           General transfer comment: HHA to walk to bathroom as pt reports that she felt unsteady initially upon standing form chair. On RA O2 SATs 96% prior to activity in room, 92% after activity seated in recliner     Balance Overall balance assessment: Needs assistance Sitting-balance support: No upper extremity supported, Feet supported Sitting balance-Leahy Scale: Good     Standing balance support: During functional activity, Bilateral upper extremity supported Standing balance-Leahy Scale: Fair                             ADL either  performed or assessed with clinical judgement   ADL Overall ADL's : Needs assistance/impaired     Grooming: Wash/dry hands;Wash/dry face;Supervision/safety;Standing       Lower Body Bathing: Supervison/ safety;Sitting/lateral leans;Sit to/from stand Lower Body Bathing Details (indicate cue type and reason): simulated     Lower Body Dressing: Supervision/safety   Toilet Transfer: Contact guard assist;Supervision/safety Toilet Transfer Details (indicate cue type and reason): patient was unsteady with movements initially requiring CGA HHA walking to toilet Toileting- Clothing Manipulation and Hygiene: Modified independent;Sit to/from stand   Tub/ Shower Transfer: Supervision/safety;Ambulation;Grab bars   Functional mobility during ADLs: Supervision/safety General ADL Comments: on RA O2 SATs 96% prior to activity in room, 92% after activity seated in recliner    Extremity/Trunk Assessment Upper Extremity Assessment Upper Extremity Assessment: Overall WFL for tasks assessed   Lower Extremity Assessment Lower Extremity Assessment: Defer to PT evaluation   Cervical / Trunk Assessment Cervical / Trunk Assessment: Normal    Vision Baseline Vision/History: 1 Wears glasses Ability to See in Adequate Light: 0 Adequate Patient Visual Report: No change from baseline     Perception     Praxis     Communication Communication Communication: No apparent difficulties   Cognition Arousal: Alert Behavior During Therapy: WFL for tasks assessed/performed, Anxious Cognition: No apparent impairments  Following commands: Intact        Cueing      Exercises      Shoulder Instructions       General Comments      Pertinent Vitals/ Pain       Pain Assessment Pain Assessment: Faces Faces Pain Scale: Hurts a little bit Pain Location: chest tube site Pain Descriptors / Indicators: Sore, Discomfort Pain Intervention(s): Monitored during  session, Repositioned  Home Living                                          Prior Functioning/Environment              Frequency  Min 1X/week        Progress Toward Goals  OT Goals(current goals can now be found in the care plan section)  Progress towards OT goals: Progressing toward goals     Plan      Co-evaluation                 AM-PAC OT "6 Clicks" Daily Activity     Outcome Measure   Help from another person eating meals?: None Help from another person taking care of personal grooming?: A Little Help from another person toileting, which includes using toliet, bedpan, or urinal?: A Little Help from another person bathing (including washing, rinsing, drying)?: A Little Help from another person to put on and taking off regular upper body clothing?: A Little Help from another person to put on and taking off regular lower body clothing?: A Little 6 Click Score: 19    End of Session Equipment Utilized During Treatment: Gait belt;Rolling walker (2 wheels)  OT Visit Diagnosis: Muscle weakness (generalized) (M62.81);Other abnormalities of gait and mobility (R26.89)   Activity Tolerance Patient tolerated treatment well   Patient Left with call bell/phone within reach;in chair   Nurse Communication          Time: 4132-4401 OT Time Calculation (min): 24 min  Charges: OT General Charges $OT Visit: 1 Visit OT Treatments $Self Care/Home Management : 8-22 mins $Therapeutic Activity: 8-22 mins    Galen Manila 07/11/2023, 10:58 AM

## 2023-07-11 NOTE — Progress Notes (Signed)
 Physical Therapy Treatment Patient Details Name: Jade Perez MRN: [id number] DOB: Jun 16, 1955 Today's Date: 07/11/2023   History of Present Illness 68 yo female admitted with PNA. Hx of DM, OSA, depression, asthma, NSTEMI, RCR    PT Comments  Pt agreeable to mobilize, reports anxiousness with mobility, also relief that chest tube has been removed. Pt mobilizing on RA with SpO2 96-98%, no SOB noted. Pt completing transfers from recliner and toilet supv to modified ind, no AD needed, increased time to mobilize without LOB. Pt amb without AD in room, intermittent furniture surfing without unsteadiness, appears to be more for anxiousness. In hallway, pt pushing dynamap machine, good steadiness, step through gait pattern, decreased cadence. Pt on RA at EOS with SpO2 98%- RN notified. Pt reports has RW and SPC she can borrow from family member at home, if needed. Recommend HHPT and family support at d/c.   If plan is discharge home, recommend the following: A little help with walking and/or transfers;A little help with bathing/dressing/bathroom;Assistance with cooking/housework;Assist for transportation;Help with stairs or ramp for entrance   Can travel by private vehicle        Equipment Recommendations  None recommended by PT    Recommendations for Other Services       Precautions / Restrictions Precautions Precautions: Fall Precaution/Restrictions Comments: monitor O2 Restrictions Weight Bearing Restrictions Per Provider Order: No     Mobility  Bed Mobility               General bed mobility comments: pt in chair upon arrival    Transfers Overall transfer level: Needs assistance Equipment used: None Transfers: Sit to/from Stand Sit to Stand: Supervision, Modified independent (Device/Increase time)           General transfer comment: supv to mod ind, no AD or HHA, initially anxiousness with first STS, improves with reps and different surfaces (toilet and recliner)     Ambulation/Gait Ambulation/Gait assistance: Supervision Gait Distance (Feet): 150 Feet Assistive device: None (dynamap) Gait Pattern/deviations: Step-through pattern, Decreased stride length Gait velocity: decreased     General Gait Details: pt amb in room without AD, intermittently "furniture surfing" without LOB but appears more for anxiousness; in hallway, pt amb pushing dynamap, on RA with SpO2 96-98%, no unsteadiness or near falls   Stairs             Wheelchair Mobility     Tilt Bed    Modified Rankin (Stroke Patients Only)       Balance Overall balance assessment: Mild deficits observed, not formally tested                                          Communication Communication Communication: No apparent difficulties  Cognition Arousal: Alert Behavior During Therapy: WFL for tasks assessed/performed, Anxious   PT - Cognitive impairments: No apparent impairments                       PT - Cognition Comments: anxiousness initially improving with mobility Following commands: Intact      Cueing Cueing Techniques: Verbal cues  Exercises      General Comments General comments (skin integrity, edema, etc.): Pt on RA with SpO2 96-98% - left on RA and RN notified      Pertinent Vitals/Pain Pain Assessment Pain Assessment: Faces Faces Pain Scale: Hurts a little bit Pain Location: chest  tube removal site Pain Descriptors / Indicators:  ("it's a little touchy") Pain Intervention(s): Limited activity within patient's tolerance, Monitored during session    Home Living                          Prior Function            PT Goals (current goals can now be found in the care plan section) Acute Rehab PT Goals Patient Stated Goal: to get better and regain PLOF Time For Goal Achievement: 07/15/23 Potential to Achieve Goals: Good Progress towards PT goals: Progressing toward goals    Frequency    Min  1X/week      PT Plan      Co-evaluation              AM-PAC PT "6 Clicks" Mobility   Outcome Measure  Help needed turning from your back to your side while in a flat bed without using bedrails?: None Help needed moving from lying on your back to sitting on the side of a flat bed without using bedrails?: None Help needed moving to and from a bed to a chair (including a wheelchair)?: None Help needed standing up from a chair using your arms (e.g., wheelchair or bedside chair)?: None Help needed to walk in hospital room?: A Little Help needed climbing 3-5 steps with a railing? : A Little 6 Click Score: 22    End of Session Equipment Utilized During Treatment: Gait belt Activity Tolerance: Patient tolerated treatment well Patient left: in chair;with call bell/phone within reach Nurse Communication: Mobility status;Other (comment) (SpO2, on RA) PT Visit Diagnosis: Unsteadiness on feet (R26.81);Muscle weakness (generalized) (M62.81)     Time: 4098-1191 PT Time Calculation (min) (ACUTE ONLY): 28 min  Charges:    $Therapeutic Exercise: 8-22 mins $Therapeutic Activity: 8-22 mins PT General Charges $$ ACUTE PT VISIT: 1 Visit                     Tori Haydin Calandra PT, DPT 07/11/23, 2:12 PM

## 2023-07-11 NOTE — Progress Notes (Signed)
 NAME:  Jade Perez, MRN:  [id number], DOB:  05/21/1955, LOS: 11 ADMISSION DATE:  06/30/2023, CONSULTATION DATE:  07/04/2023 REFERRING MD:  DR Nelson Chimes, CHIEF COMPLAINT:  LLL white out    History of Present Illness:   68 year old female with past medical history of non-smoking, hypertension, hyperlipidemia, type 2 diabetes, OSA, depression, asthma not otherwise specified presented on 06/30/2023 with cough worsening dyspnea and pleuritic chest pain on the left lower lobe. PRior to that had sick resp contact with grandchild who live with her and then she got sick  CT chest showed left lower lobe consolidation with a small pleural effusion [personal visualization of the CCM MD on this consult] she was hypoxemic requiring oxygen.  She had leukocytosis with white count 20,100.  Pulmonary embolism was ruled out started on Treatment.  Subsequently urine strep and Legionella negative.  Respiratory virus panel for COVID, flu and RSV negative [20 panel not done].  Since admission has been afebrile.  Has improving leukocytosis from 20,002 currently 14,600.  Cultures remain negative.  On 07/03/2023 underwent left-sided thoracentesis removing 350 cc by interventional radiology.  This shows a simple exudate with LDH 633 and acute neutrophilic leukocytosis with polymorphs of 86%.  No eosinophils.  Despite all these interventions has remained on 4 L nasal cannula.  She has been covered by IV antibiotics with ceftriaxone and azithromycin since admission and due to and on 07/04/2023 [facility] have PCCM consult].  Of note prior to admission she was treated as outpatient with antibiotics and then failed that.   Pulmonary is now been consulted with a question of residual loculated effusion  Past Medical History:    has a past medical history of Allergic rhinitis, Asthma, mild intermittent, Complication of anesthesia, Depression, GAD (generalized anxiety disorder), GERD (gastroesophageal reflux disease), Hiatal hernia,  History of cardiomyopathy in adulthood (12/06/2021), History of heart murmur in childhood, History of kidney stones, History of non-ST elevation myocardial infarction (NSTEMI) (12/06/2021), Hyperlipidemia, mixed, Hypertension, IDA (iron deficiency anemia), OA (osteoarthritis), OSA (obstructive sleep apnea), Renal disorder, Type 2 diabetes mellitus (HCC) (2008), and Wears glasses.   reports that she has never smoked. She has never used smokeless tobacco.   Significant Hospital Events:  06/30/2023 - admit -> CT angiogram rule out pulmonary embolism.  Small left-sided pleural effusion.  Significant left lower lobe consolidation 07/03/2023: Worsening chest x-ray with significant whiteout on the left side.  S/p thoracentesis by IR draining only 320 cc.  Simple exudate -> post Thora chest x-ray without change 3/28 chest tube placed via IR 3/29 1st dose lytics after IR was able to troubleshoot tube after would not flush or aspirate  3/30 2nd dose of tube lytics 3/31 650 CT outpt; repeat 3rd dose lytics today 4/1 repeat chest CT 4/3 went for IR repositioning of CT  Interim History / Subjective:  Only 60 cc from chest tube since repositioning Not having any pain or discomfort at present  Objective   Blood pressure 114/66, pulse 70, temperature 98.4 F (36.9 C), resp. rate 18, height 5\' 2"  (1.575 m), weight 90.7 kg, SpO2 98%.        Intake/Output Summary (Last 24 hours) at 07/11/2023 1002 Last data filed at 07/11/2023 0430 Gross per 24 hour  Intake 30 ml  Output 562 ml  Net -532 ml   Filed Weights   06/30/23 0046  Weight: 90.7 kg   Examination: General: Pleasant, does not appear to be in distress  HEENT: Moist oral mucosa  neuro: Awake alert  interactive CV: S1-S2 appreciated PULM: Few rales at the bases bilaterally Extremities: warm/dry, no LE edema   Reviewed CT scans with the patient and her spouse Reviewed chest x-rays Resolved Hospital Problem list   N/a  Assessment & Plan:    Parapneumonic effusion -Status post chest tube and fibrinolytic x 3 -Chest tube manipulation/replacement/times 3 -Minimal effluent  Discussed with IR and cardiothoracic surgery 07/10/2023  At present with minimal effluent from tube and chest tube position, following discussions with patient Plan will be to discontinue chest tube today  Follow-up as outpatient  Will plan to get a CT scan of the chest in about 4 to 6 weeks  Plan for outpatient follow-up in about 2 to 3 weeks  Complete course of antibiotics  Graded activities as tolerated Respiratory muscle training with incentive spirometer, PEP device  Will plan for removal of chest tube later today  Can tentatively plan for discharge 07/12/2023  Will message the office to make office visit appointment in 2 to 3 weeks  Virl Diamond, MD [facility] Pager: See Loretha Stapler

## 2023-07-11 NOTE — Progress Notes (Signed)
 PROGRESS NOTE    Brigette Hopfer  OZH:[id number] DOB: May 09, 1955 DOA: 06/30/2023 PCP: Abner Greenspan, MD   Brief Narrative: 68 year old with past medical history significant for hypertension, hyperlipidemia, diabetes type 2, OSA, depression, asthma, non-STEMI with normal coronary arteries, admitted with shortness of breath and cough.  She was diagnosed with left lower pneumonia outpatient on 3/22 but returned due to worsening of chest pain and discomfort.  CTA chest was negative for PE but does show left lower lobe consolidation.  She was started on ceftriaxone and azithromycin.  Unfortunately chest x-ray showed worsening left-sided pleural effusion, thoracentesis [age] year old 320 cc of fluids.  CT was performed subsequently and show concern for loculation, chest tube was placed 3/28.  Patient had received 3 doses of lytic since then.  Repeated CT chest on 4/1 show mild improvement.  3/27-thoracentesis yielding 320 cc 3/28-persistent loculated effusions noted on CT chest and chest tube was placed 3/29, 3/30, 3/31 -received 3 doses of lytics once daily 4/1 -repeat CT chest shows improvement in effusion.  Only small loculations 4/03 went to IR for repositioning of chest tube 4/04 chest tube removed.   Assessment & Plan:   Principal Problem:   CAP (community acquired pneumonia) Active Problems:   Intrinsic asthma   Type 2 diabetes mellitus without complication, without long-term current use of insulin (HCC)   Acute respiratory failure with hypoxia (HCC)   Hypokalemia   Hypertension   Depression   OSA (obstructive sleep apnea)   Hyperlipidemia, mixed   Pleural effusion  1-Left lower lobe community-acquired pneumonia, MRSA pneumonia Left-sided pleural effusion -Patient failed outpatient treatment.  During hospitalization she has also received IV ceftriaxone and azithromycin without significant improvement. -Initial CTA showed left lower lobe consolidation but later repeated chest x-ray  showed worsening pleural effusion requiring thoracentesis 3/27 yielding 320 cc of fluids without significant improvement -Repeated CT chest showed persistent pleural effusion with concern for loculation for which chest tube was placed on 3/28 -Has received 3 doses of lytics -Cultures grew MRSA, therefore patient was started on linezolid -CT chest/1 showed improved fluid and loculation. -Pulmonology is following, recommend IR eval for repositioning of chest tube.  -4/03; went to IR for reposition of chest tube.  4/04 pulmonologist recommend to remove Chest tube.  Plan for tentatively discharge tomorrow per pulmonologist    2-Hypokalemia/hypophosphatemia: Replaced.   Hyponatremia: Resolved   Essential hypertension: -Continue with Norvasc.   Hyperlipidemia: -Continue with Lipitor.   Diabetes type 2 uncontrolled hyperglycemia: Continue with Amaryl, Semglee and Novolog  History of asthma and OSA: PRN nebulizer.   Depression: Continue with Celexa   Smudge cells - Smudge cells noted on peripheral smear, percentage not specified  - Could be d/t the infection but she will need further workup with flow cytometry if leukocytosis persists  -Leukocytosis has resolved  Estimated body mass index is 36.58 kg/m as calculated from the following:   Height as of this encounter: 5\' 2"  (1.575 m).   Weight as of this encounter: 90.7 kg.   DVT prophylaxis: Lovenox Code Status: Full code Family Communication: Care discussed with patient, and husband  Disposition Plan:  Status is: Inpatient Remains inpatient appropriate because: management of loculated effusion.     Consultants:  [facility] IR  Procedures:  Chest tube   Antimicrobials:    Subjective: She is up in recliner. Report breathing better,. Her appetitive has return.   Objective: Vitals:   07/10/23 1945 07/11/23 0431 07/11/23 0905 07/11/23 1500  BP:  114/66    Pulse:  70    Resp:  18    Temp:  98.4 F (36.9 C)     TempSrc:      SpO2: 98% 96% 98% 95%  Weight:      Height:        Intake/Output Summary (Last 24 hours) at 07/11/2023 1609 Last data filed at 07/11/2023 1032 Gross per 24 hour  Intake 40 ml  Output 522 ml  Net -482 ml   Filed Weights   06/30/23 0046  Weight: 90.7 kg    Examination:  General exam: NAD Respiratory system: Chest tube in placed. No wheezing.  Cardiovascular system: S 1,NT, ND Central nervous system: alert Extremities:no edema  Data Reviewed: I have personally reviewed following labs and imaging studies  CBC: Recent Labs  Lab 07/06/23 0705 07/07/23 0454 07/09/23 0557 07/10/23 0526 07/11/23 0550  WBC 17.8* 20.7* 9.6 9.4 8.2  HGB 13.1 13.8 10.7* 10.5* 9.7*  HCT 40.2 42.0 31.4* 32.1* 29.2*  MCV 91.6 90.3 89.0 92.2 89.0  PLT 434* 529* 456* 474* 478*   Basic Metabolic Panel: Recent Labs  Lab 07/06/23 0705 07/07/23 0454 07/08/23 1602 07/09/23 0557 07/10/23 0526 07/11/23 0550  NA 131* 125* 130* 134* 135 135  K 4.1 4.1 4.3 4.2 4.3 4.1  CL 95* 91* 94* 98 99 101  CO2 24 22 26 28 25 27   GLUCOSE 173* 242* 249* 127* 144* 139*  BUN 14 14 13 10 10 9   CREATININE 0.76 0.71 0.73 0.63 0.67 0.60  CALCIUM 8.8* 8.5* 8.4* 8.3* 8.4* 8.4*  MG 1.8 1.8  --  1.9 1.8 1.9   GFR: Estimated Creatinine Clearance: 69.5 mL/min (by C-G formula based on SCr of 0.6 mg/dL). Liver Function Tests: No results for input(s): "AST", "ALT", "ALKPHOS", "BILITOT", "PROT", "ALBUMIN" in the last 168 hours. No results for input(s): "LIPASE", "AMYLASE" in the last 168 hours. No results for input(s): "AMMONIA" in the last 168 hours. Coagulation Profile: No results for input(s): "INR", "PROTIME" in the last 168 hours.  Cardiac Enzymes: No results for input(s): "CKTOTAL", "CKMB", "CKMBINDEX", "TROPONINI" in the last 168 hours. BNP (last 3 results) No results for input(s): "PROBNP" in the last 8760 hours. HbA1C: No results for input(s): "HGBA1C" in the last 72 hours. CBG: Recent Labs   Lab 07/10/23 1217 07/10/23 1642 07/10/23 2043 07/11/23 0807 07/11/23 1242  GLUCAP 116* 280* 210* 138* 257*   Lipid Profile: No results for input(s): "CHOL", "HDL", "LDLCALC", "TRIG", "CHOLHDL", "LDLDIRECT" in the last 72 hours. Thyroid Function Tests: No results for input(s): "TSH", "T4TOTAL", "FREET4", "T3FREE", "THYROIDAB" in the last 72 hours. Anemia Panel: No results for input(s): "VITAMINB12", "FOLATE", "FERRITIN", "TIBC", "IRON", "RETICCTPCT" in the last 72 hours. Sepsis Labs: Recent Labs  Lab 07/05/23 0836  PROCALCITON 0.78    Recent Results (from the past 240 hours)  Expectorated Sputum Assessment w Gram Stain, Rflx to Resp Cult     Status: None   Collection Time: 07/02/23  4:43 PM   Specimen: Expectorated Sputum  Result Value Ref Range Status   Specimen Description EXPECTORATED SPUTUM  Final   Special Requests NONE  Final   Sputum evaluation   Final    THIS SPECIMEN IS ACCEPTABLE FOR SPUTUM CULTURE Performed at [facility], [address], [place], [place] [address]    Report Status 07/02/2023 FINAL  Final  Culture, Respiratory w Gram Stain     Status: None   Collection Time: 07/02/23  4:43 PM  Result Value Ref Range Status   Specimen  Description   Final    EXPECTORATED SPUTUM Performed at [facility], [address], [place], [place] [address]    Special Requests   Final    NONE Reflexed from [phone] [id number] Performed at [facility], [address], [place], [place] [address]    Gram Stain   Final    FEW WBC PRESENT, PREDOMINANTLY PMN FEW GRAM POSITIVE COCCI RARE YEAST WITH PSEUDOHYPHAE RARE GRAM NEGATIVE RODS Performed at [facility], [address] [place], [place] [address]    Culture   Final    RARE METHICILLIN RESISTANT STAPHYLOCOCCUS AUREUS RARE CANDIDA ALBICANS    Report Status 07/06/2023 FINAL  Final   Organism ID, Bacteria METHICILLIN RESISTANT STAPHYLOCOCCUS AUREUS  Final       Susceptibility   Methicillin resistant staphylococcus aureus - MIC*    CIPROFLOXACIN >=8 RESISTANT Resistant     ERYTHROMYCIN >=8 RESISTANT Resistant     GENTAMICIN <=0.5 SENSITIVE Sensitive     OXACILLIN >=4 RESISTANT Resistant     TETRACYCLINE <=1 SENSITIVE Sensitive     VANCOMYCIN <=0.5 SENSITIVE Sensitive     TRIMETH/SULFA >=320 RESISTANT Resistant     CLINDAMYCIN >=8 RESISTANT Resistant     RIFAMPIN <=0.5 SENSITIVE Sensitive     Inducible Clindamycin NEGATIVE Sensitive     LINEZOLID 2 SENSITIVE Sensitive     * RARE METHICILLIN RESISTANT STAPHYLOCOCCUS AUREUS  Fungus Culture With Stain     Status: None (Preliminary result)   Collection Time: 07/03/23  2:24 PM   Specimen: PATH Cytology Pleural fluid  Result Value Ref Range Status   Fungus Stain Final report  Final    Comment: (NOTE) Performed At: [facility] [address] [place], [place] [id number] Jolene Schimke MD VH:[id number]    Fungus (Mycology) Culture PENDING  Incomplete   Fungal Source PLEURAL  Final    Comment: Performed at [facility], [address], [place], [place] [address]  Culture, Fungus without Smear     Status: None (Preliminary result)   Collection Time: 07/03/23  2:24 PM   Specimen: PATH Cytology Pleural fluid  Result Value Ref Range Status   Specimen Description   Final    PLEURAL Performed at [facility], [address], [place], [place] [address]    Special Requests   Final    NONE Performed at [facility], [address] [place], [place] [address]    Culture   Final    NO FUNGUS ISOLATED AFTER 8 DAYS Performed at [facility], [address], [place], [place] [address]    Report Status PENDING  Incomplete  Body fluid culture w Gram Stain     Status: None   Collection Time: 07/03/23  2:24 PM   Specimen: PATH Cytology Pleural fluid  Result Value Ref Range Status   Specimen Description   Final    PLEURAL Performed  at [facility], [address] [place], [place] [address]    Special Requests   Final    NONE Performed at [facility], [address] [place], [place] [address]    Gram Stain NO WBC SEEN NO ORGANISMS SEEN   Final   Culture   Final    NO GROWTH 3 DAYS Performed at [facility], [address], [place], [place] [address]    Report Status 07/06/2023 FINAL  Final  Fungus Culture Result     Status: None  Collection Time: 07/03/23  2:24 PM  Result Value Ref Range Status   Result 1 Comment  Final    Comment: (NOTE) KOH/Calcofluor preparation:  no fungus observed. Performed At: [facility] [address] [place], [place] [id number] Jolene Schimke MD WN:[id number]   Respiratory (~20 pathogens) panel by PCR     Status: None   Collection Time: 07/04/23 10:04 AM   Specimen: Nasopharyngeal Swab; Respiratory  Result Value Ref Range Status   Adenovirus NOT DETECTED NOT DETECTED Final   Coronavirus 229E NOT DETECTED NOT DETECTED Final    Comment: (NOTE) The Coronavirus on the Respiratory Panel, DOES NOT test for the novel  Coronavirus (2019 nCoV)    Coronavirus HKU1 NOT DETECTED NOT DETECTED Final   Coronavirus NL63 NOT DETECTED NOT DETECTED Final   Coronavirus OC43 NOT DETECTED NOT DETECTED Final   Metapneumovirus NOT DETECTED NOT DETECTED Final   Rhinovirus / Enterovirus NOT DETECTED NOT DETECTED Final   Influenza A NOT DETECTED NOT DETECTED Final   Influenza B NOT DETECTED NOT DETECTED Final   Parainfluenza Virus 1 NOT DETECTED NOT DETECTED Final   Parainfluenza Virus 2 NOT DETECTED NOT DETECTED Final   Parainfluenza Virus 3 NOT DETECTED NOT DETECTED Final   Parainfluenza Virus 4 NOT DETECTED NOT DETECTED Final   Respiratory Syncytial Virus NOT DETECTED NOT DETECTED Final   Bordetella pertussis NOT DETECTED NOT DETECTED Final   Bordetella Parapertussis NOT DETECTED NOT DETECTED Final   Chlamydophila pneumoniae NOT DETECTED  NOT DETECTED Final   Mycoplasma pneumoniae NOT DETECTED NOT DETECTED Final    Comment: Performed at [facility], [address] [place], [place] [address]         Radiology Studies: DG CHEST PORT 1 VIEW Result Date: 07/11/2023 CLINICAL DATA:  Left-sided chest tube. EXAM: PORTABLE CHEST 1 VIEW COMPARISON:  July 10, 2023. FINDINGS: Stable position of left-sided chest tube. Stable moderate size left pleural effusion is noted. Right lung is clear. No definite pneumothorax. IMPRESSION: Stable left-sided chest tube with moderate size left pleural effusion. Electronically Signed   By: Lupita Raider M.D.   On: 07/11/2023 12:10   IR Catheter Tube Change Result Date: 07/10/2023 CLINICAL DATA:  Pleural catheter evaluation. EXAM: FLUOROSCOPIC-GUIDED LEFT NON TUNNELED PLEURAL CATHETER EVALUATION AND EXCHANGE. COMPARISON:  CT chest 07/08/2023 and 07/04/2023. CONTRAST:  None MEDICATIONS: None. ANESTHESIA/SEDATION: Moderate (conscious) sedation was employed during this procedure. A total of Versed 4 mg and Fentanyl 100 mcg was administered intravenously. Moderate Sedation Time: 30 minutes. The patient's level of consciousness and vital signs were monitored continuously by radiology nursing throughout the procedure under my direct supervision. FLUOROSCOPY TIME:  Fluoroscopic dose; 34 mGy TECHNIQUE: Patient was positioned supine on the fluoroscopy table. The external portion of the existing percutaneous drainage catheter as well as the surrounding skin was prepped and draped in usual sterile fashion. A preprocedural spot fluoroscopic image was obtained of the existing percutaneous drainage catheter in various obliquities. The external portion of the percutaneous drainage catheter was cut and cannulated with a guidewire. Access was attempted to the basilar chest, however could not be obtained. Under intermittent fluoroscopic guidance, the existing percutaneous drainage catheter was exchanged for a new 12 Fr 40 cm  percutaneous drainage catheter with end coiled and locked within the LEFT apical chest. The percutaneous drainage catheter was connected to a pleura vac and secured in place within interrupted suture and a StatLock device. A dressing was applied. The patient tolerated the procedure well without immediate postprocedural complication.  FINDINGS: *Well-functioning LEFT non tunneled pleural drainage catheter. *Attempted repositioning to the basilar chest, aborted. *Drainage catheter exchange for a new 12 Fr 40 cm pleural drain. IMPRESSION: 1. Fluoroscopic-guided LEFT non tunneled pleural catheter evaluation and exchange. 2. Unsuccessful repositioning to the basilar chest. PLAN: .  Thrombolytics by pulmonary team. Roanna Banning, MD [facility] Radiology Electronically Signed   By: Roanna Banning M.D.   On: 07/10/2023 17:25   DG CHEST PORT 1 VIEW Result Date: 07/10/2023 CLINICAL DATA:  Chest tube placement EXAM: PORTABLE CHEST 1 VIEW COMPARISON:  X-ray 07/09/2023 and older FINDINGS: Left chest tube in place. Change position with the pigtail now the left lung apex. Persistent moderate left effusion and adjacent lung opacity. Persistent right basilar atelectasis or scar. No right-sided effusion or consolidation or pneumothorax. Enlarged cardiopericardial silhouette. Calcified aorta. Degenerative changes of the spine. Film is rotated to left. IMPRESSION: Change position of the left pigtail catheter with tip now at the left lung apex. Stable left-sided pleural effusion and opacity. No clear definable pneumothorax at this time Electronically Signed   By: Karen Kays M.D.   On: 07/10/2023 17:19        Scheduled Meds:  amLODipine  5 mg Oral Daily   arformoterol  15 mcg Nebulization BID   atorvastatin  40 mg Oral QHS   citalopram  20 mg Oral Daily   enoxaparin (LOVENOX) injection  40 mg Subcutaneous Q24H   fenofibrate  160 mg Oral Daily   ferrous sulfate  325 mg Oral Q  breakfast   glimepiride  0.5 mg Oral BID   guaiFENesin  10 mL Oral Q6H   insulin aspart  0-5 Units Subcutaneous QHS   insulin aspart  0-6 Units Subcutaneous TID WC   insulin aspart  3 Units Subcutaneous TID WC   insulin glargine-yfgn  14 Units Subcutaneous Daily   lidocaine  2 patch Transdermal Q24H   linezolid  600 mg Oral Q12H   montelukast  10 mg Oral QHS   pantoprazole  40 mg Oral Daily   revefenacin  175 mcg Nebulization Daily   sodium chloride flush  10 mL Intrapleural Q8H   sodium chloride flush  3 mL Intravenous Q12H   Continuous Infusions:     LOS: 11 days    Time spent: 35 minutes    Drayson Dorko A Kanyon Bunn, MD [facility]   If 7PM-7AM, please contact night-coverage [email or web address]  07/11/2023, 4:09 PM

## 2023-07-11 NOTE — Progress Notes (Signed)
 Patient ID: Jade Perez, female   DOB: 1955-06-18, 68 y.o.   MRN: [id number]    Referring Physician(s): Kalman Shan, MD   Supervising Physician: Irish Lack  Patient Status:  [facility] - In-pt  Chief Complaint:   Follow up left chest tube placement 07/04/23 in IR  Pleural effusion  Subjective:  Pt doing well. Coughing less, feeling less SOB. Appetite improved. Had chest tube replaced yesterday. Had attempted to guide the tube to base of the left lung, although not able to do so during procedure yesterday.   Allergies: Amoxicillin, Aspirin, Bee venom, Clarithromycin, Erythromycin, Ibuprofen, Oxycodone, Sulfa antibiotics, Latex, and Tape  Medications: Prior to Admission medications   Medication Sig Start Date End Date Taking? Authorizing Provider  acetaminophen (TYLENOL) 500 MG tablet Take 500 mg by mouth every 6 (six) hours as needed for mild pain.   Yes [provider]  albuterol (PROAIR HFA) 108 (90 Base) MCG/ACT inhaler Can inhale two puffs every four to six hours as needed for cough or wheeze. 06/08/20  Yes Kozlow, Alvira Philips, MD  amLODipine (NORVASC) 5 MG tablet Take 1 tablet (5 mg total) by mouth daily. Patient taking differently: Take 5 mg by mouth daily. 12/16/21  Yes Regalado, Belkys A, MD  atorvastatin (LIPITOR) 40 MG tablet Take 40 mg by mouth at bedtime.   Yes [provider]  b complex vitamins tablet Take 1 tablet by mouth at bedtime.   Yes [provider]  cephALEXin (KEFLEX) 500 MG capsule Take 1 capsule (500 mg total) by mouth 3 (three) times daily. 06/29/23  Yes Delo, Riley Lam, MD  cetirizine (ZYRTEC) 10 MG tablet Take 10 mg by mouth daily.   Yes [provider]  citalopram (CELEXA) 40 MG tablet Take 20 mg by mouth daily.   Yes [provider]  FARXIGA 10 MG TABS tablet Take 10 mg by mouth daily. 05/21/23  Yes [provider]  fenofibrate (TRICOR) 145 MG tablet Take 145 mg by mouth at bedtime. 09/22/19  Yes  [provider]  Ferrous Sulfate (SLOW FE PO) Take 1 tablet by mouth daily.   Yes [provider]  glimepiride (AMARYL) 1 MG tablet Take 0.5 mg by mouth 2 (two) times daily. 02/15/19  Yes [provider]  indapamide (LOZOL) 1.25 MG tablet Take 1.25 mg by mouth daily. 03/10/23  Yes [provider]  montelukast (SINGULAIR) 10 MG tablet TAKE 1 TABLET BY MOUTH EVERY DAY Patient taking differently: Take 10 mg by mouth at bedtime. 07/19/19  Yes Kozlow, Alvira Philips, MD  nystatin cream (MYCOSTATIN) Apply 1 Application topically as needed for dry skin (itching, burning, redness). 05/02/23  Yes [provider]  omeprazole (PRILOSEC) 40 MG capsule TAKE 1 CAPSULE BY MOUTH EVERY DAY IN THE MORNING BEFORE BREAKFAST Patient taking differently: Take 40 mg by mouth daily. 07/30/21  Yes Kozlow, Alvira Philips, MD  Semaglutide, 1 MG/DOSE, 2 MG/1.5ML SOPN Inject 2 mg into the skin once a week. Wednesday's 05/25/20  Yes [provider]     Vital Signs: BP 114/66 (BP Location: Left Arm)   Pulse 70   Temp 98.4 F (36.9 C)   Resp 18   Ht 5\' 2"  (1.575 m)   Wt 200 lb (90.7 kg)   SpO2 98%   BMI 36.58 kg/m   Physical Exam Vitals and nursing note reviewed.  Constitutional:      Appearance: Normal appearance. She is not ill-appearing or toxic-appearing.  HENT:     Mouth/Throat:  Mouth: Mucous membranes are moist.     Pharynx: Oropharynx is clear.  Cardiovascular:     Rate and Rhythm: Normal rate and regular rhythm.  Pulmonary:     Effort: Pulmonary effort is normal.     Comments: +chest tube from left anterior chest. Appears well. No air leak, no overlying abnormality. Some diminished breath sounds in left lower lung. Crackles improved from days prior. Abdominal:     Palpations: Abdomen is soft.     Tenderness: There is no abdominal tenderness.  Musculoskeletal:     Right lower leg: No edema.     Left lower leg: No edema.  Skin:    General: Skin is warm and dry.   Neurological:     General: No focal deficit present.     Mental Status: She is alert and oriented to person, place, and time. Mental status is at baseline.     Imaging: IR Catheter Tube Change Result Date: 07/10/2023 CLINICAL DATA:  Pleural catheter evaluation. EXAM: FLUOROSCOPIC-GUIDED LEFT NON TUNNELED PLEURAL CATHETER EVALUATION AND EXCHANGE. COMPARISON:  CT chest 07/08/2023 and 07/04/2023. CONTRAST:  None MEDICATIONS: None. ANESTHESIA/SEDATION: Moderate (conscious) sedation was employed during this procedure. A total of Versed 4 mg and Fentanyl 100 mcg was administered intravenously. Moderate Sedation Time: 30 minutes. The patient's level of consciousness and vital signs were monitored continuously by radiology nursing throughout the procedure under my direct supervision. FLUOROSCOPY TIME:  Fluoroscopic dose; 34 mGy TECHNIQUE: Patient was positioned supine on the fluoroscopy table. The external portion of the existing percutaneous drainage catheter as well as the surrounding skin was prepped and draped in usual sterile fashion. A preprocedural spot fluoroscopic image was obtained of the existing percutaneous drainage catheter in various obliquities. The external portion of the percutaneous drainage catheter was cut and cannulated with a guidewire. Access was attempted to the basilar chest, however could not be obtained. Under intermittent fluoroscopic guidance, the existing percutaneous drainage catheter was exchanged for a new 12 Fr 40 cm percutaneous drainage catheter with end coiled and locked within the LEFT apical chest. The percutaneous drainage catheter was connected to a pleura vac and secured in place within interrupted suture and a StatLock device. A dressing was applied. The patient tolerated the procedure well without immediate postprocedural complication. FINDINGS: *Well-functioning LEFT non tunneled pleural drainage catheter. *Attempted repositioning to the basilar chest, aborted.  *Drainage catheter exchange for a new 12 Fr 40 cm pleural drain. IMPRESSION: 1. Fluoroscopic-guided LEFT non tunneled pleural catheter evaluation and exchange. 2. Unsuccessful repositioning to the basilar chest. PLAN: .  Thrombolytics by pulmonary team. Roanna Banning, MD [facility] Electronically Signed   By: Roanna Banning M.D.   On: 07/10/2023 17:25   DG CHEST PORT 1 VIEW Result Date: 07/10/2023 CLINICAL DATA:  Chest tube placement EXAM: PORTABLE CHEST 1 VIEW COMPARISON:  X-ray 07/09/2023 and older FINDINGS: Left chest tube in place. Change position with the pigtail now the left lung apex. Persistent moderate left effusion and adjacent lung opacity. Persistent right basilar atelectasis or scar. No right-sided effusion or consolidation or pneumothorax. Enlarged cardiopericardial silhouette. Calcified aorta. Degenerative changes of the spine. Film is rotated to left. IMPRESSION: Change position of the left pigtail catheter with tip now at the left lung apex. Stable left-sided pleural effusion and opacity. No clear definable pneumothorax at this time Electronically Signed   By: Karen Kays M.D.   On: 07/10/2023 17:19   DG Chest Port 1 View Result Date: 07/09/2023 CLINICAL DATA:  [id number] with pleural effusion, left chest tube. EXAM: PORTABLE CHEST 1 VIEW COMPARISON:  Portable chest yesterday at 8:49 a.m. FINDINGS: 5:10 a.m. Low inspiration on exam as before. Left chest tube with pigtail at the mid chest is unchanged in position. There is no measurable pneumothorax. Moderate-to-large left pleural effusion obscures all but the apical third of the left lung. Cardiomediastinal silhouette is stable. There is mild central vascular prominence. No edema. Right lung, apical left lung clear aside from an atelectatic band in the right base. Thoracic spondylosis. Overall aeration seems unchanged.  No new abnormality. IMPRESSION: 1. Stable left chest tube. No measurable  pneumothorax. 2. Moderate-to-large left pleural effusion obscures all but the apical third of the left lung. 3. No new abnormality.  Fall Electronically Signed   By: Almira Bar M.D.   On: 07/09/2023 06:21   CT CHEST WO CONTRAST Result Date: 07/08/2023 CLINICAL DATA:  Pleural effusion.  Blood in chest tube. EXAM: CT CHEST WITHOUT CONTRAST TECHNIQUE: Multidetector CT imaging of the chest was performed following the standard protocol without IV contrast. RADIATION DOSE REDUCTION: This exam was performed according to the departmental dose-optimization program which includes automated exposure control, adjustment of the mA and/or kV according to patient size and/or use of iterative reconstruction technique. COMPARISON:  CT chest 07/04/2023. FINDINGS: Cardiovascular: No significant vascular findings. Normal heart size. No pericardial effusion. Mediastinum/Nodes: Visualized thyroid gland is within normal limits. There is a precarinal mildly enlarged lymph node measuring 11 mm there is an enlarged subcarinal lymph node measuring 11 mm. Difficult to assess for hilar adenopathy. No significant interval change. There is a small hiatal hernia. The esophagus is nondilated. Lungs/Pleura: There is a small left pleural effusion which appears slightly loculated, but is mostly dependent. This has decreased in size in the interval. There is a new chest tube in the anterior left chest. The chest tube is located in the region without pleural fluid. There is atelectasis and consolidation of the left lower lobe similar to the prior study. There is a trace layering right pleural effusion, unchanged. Atelectasis/airspace disease in the inferior right lower lobe is new from prior. Trachea and central airways are within normal limits. Upper Abdomen: No acute abnormality. Musculoskeletal: No chest wall mass or suspicious bone lesions identified. IMPRESSION: 1. Small left pleural effusion which appears slightly loculated, but is mostly  dependent. This has decreased in size in the interval. 2. New anterior left chest tube in place. The chest tube is located in the region without pleural fluid. 3. Stable trace right pleural effusion. 4. Stable atelectasis and consolidation of the left lower lobe. 5. New atelectasis/airspace disease in the inferior right lower lobe. 6. Stable mild mediastinal adenopathy. Electronically Signed   By: Darliss Cheney M.D.   On: 07/08/2023 15:22   DG Chest Port 1 View Result Date: 07/08/2023 CLINICAL DATA:  Dyspnea.  Follow-up pleural effusion EXAM: PORTABLE CHEST 1 VIEW COMPARISON:  07/07/2023 and older FINDINGS: Underinflation. No pneumothorax or edema. Increasing bandlike opacity right lung base. Favor atelectasis. Persistent left-sided pleural effusion and opacity with pigtail catheter. No pneumothorax. Stable cardiac silhouette. Film is under penetrated. IMPRESSION: No significant interval change. Electronically Signed   By: Karen Kays M.D.   On: 07/08/2023 13:09    Labs:  CBC: Recent Labs    07/07/23 0454 07/09/23 0557 07/10/23 0526 07/11/23 0550  WBC 20.7* 9.6 9.4 8.2  HGB 13.8 10.7* 10.5* 9.7*  HCT 42.0 31.4* 32.1* 29.2*  PLT 529* 456* 474* 478*  COAGS: Recent Labs    07/04/23 1342  INR 1.9*    BMP: Recent Labs    07/08/23 1602 07/09/23 0557 07/10/23 0526 07/11/23 0550  NA 130* 134* 135 135  K 4.3 4.2 4.3 4.1  CL 94* 98 99 101  CO2 26 28 25 27   GLUCOSE 249* 127* 144* 139*  BUN 13 10 10 9   CALCIUM 8.4* 8.3* 8.4* 8.4*  CREATININE 0.73 0.63 0.67 0.60  GFRNONAA >60 >60 >60 >60    LIVER FUNCTION TESTS: Recent Labs    06/28/23 2308  BILITOT 0.9  AST 31  ALT 34  ALKPHOS 41  PROT 8.1  ALBUMIN 4.3    Assessment and Plan:  Pt continues to do well and feeling much improved. Had chest tube replacement yesterday, attempted to place further in left lung base but unable to do so. Output from chest tube has dropped over past days: 670>650>200>210>90>62. Correlates  with patient feeling much improved.  Pt seen by pulmonology/critical care today. They recommended removing chest tube and having her follow up with them outpatient given her improvement. Pulm/critical care also spoke to cardiothoracic surgery who did not recommend any further surgical intervention.  Pulm/critical care plans to remove chest tube later today with tentative discharge tomorrow 07/12/23.   Further management to be guided by pulm/critical care and medicine teams. Please do not hesitate to call IR for any future needs.     Electronically Signed: Katheren Puller, PA-C 07/11/2023, 10:25 AM   I spent a total of 15 Minutes at the the patient's bedside AND on the patient's hospital floor or unit, greater than 50% of which was counseling/coordinating care for chest tube.

## 2023-07-11 NOTE — Plan of Care (Addendum)
 VSS. Patient remains on 2L [place] with O2 sat 96-99%. Patient given PRN Flexeril and Norco for chest tube site pain. BG 210 at bedtime. LBM 4/3. Husband remains at bedside. No acute events overnight.   Problem: Education: Goal: Knowledge of General Education information will improve Description: Including pain rating scale, medication(s)/side effects and non-pharmacologic comfort measures Outcome: Progressing   Problem: Clinical Measurements: Goal: Will remain free from infection Outcome: Progressing   Problem: Pain Managment: Goal: General experience of comfort will improve and/or be controlled Outcome: Progressing   Problem: Safety: Goal: Ability to remain free from injury will improve Outcome: Progressing   Problem: Activity: Goal: Ability to tolerate increased activity will improve Outcome: Progressing   Problem: Clinical Measurements: Goal: Ability to maintain a body temperature in the normal range will improve Outcome: Progressing   Problem: Respiratory: Goal: Ability to maintain adequate ventilation will improve Outcome: Progressing Goal: Ability to maintain a clear airway will improve Outcome: Progressing   Problem: Fluid Volume: Goal: Ability to maintain a balanced intake and output will improve Outcome: Progressing   Problem: Metabolic: Goal: Ability to maintain appropriate glucose levels will improve Outcome: Progressing   Problem: Skin Integrity: Goal: Risk for impaired skin integrity will decrease Outcome: Progressing

## 2023-07-11 NOTE — Progress Notes (Signed)
   07/11/23 2102  BiPAP/CPAP/SIPAP  Reason BIPAP/CPAP not in use Other(comment) (PT WANTS TO WEAR 2l OXYGEN INSTEAD)  BiPAP/CPAP /SiPAP Vitals  SpO2 98 %  Bilateral Breath Sounds Diminished

## 2023-07-11 NOTE — Progress Notes (Signed)
 Right anterior pigtail CT removed, occlusive dressing placed, pt tolerated well.       Posey Boyer, MSN, AG-ACNP-BC [facility] 07/11/2023, 1:21 PM  See Amion for pager If no response to pager , please call [phone] until 7pm After 7:00 pm call Elink  [phone]

## 2023-07-12 DIAGNOSIS — J90 Pleural effusion, not elsewhere classified: Secondary | ICD-10-CM | POA: Diagnosis not present

## 2023-07-12 DIAGNOSIS — J189 Pneumonia, unspecified organism: Secondary | ICD-10-CM | POA: Diagnosis not present

## 2023-07-12 LAB — BASIC METABOLIC PANEL WITH GFR
Anion gap: 10 (ref 5–15)
BUN: 11 mg/dL (ref 8–23)
CO2: 25 mmol/L (ref 22–32)
Calcium: 8.6 mg/dL — ABNORMAL LOW (ref 8.9–10.3)
Chloride: 102 mmol/L (ref 98–111)
Creatinine, Ser: 0.67 mg/dL (ref 0.44–1.00)
GFR, Estimated: >60 mL/min (ref >60)
Glucose, Bld: 121 mg/dL — ABNORMAL HIGH (ref 70–99)
Potassium: 4 mmol/L (ref 3.5–5.1)
Sodium: 137 mmol/L (ref 135–145)

## 2023-07-12 LAB — CBC
HCT: 29.2 % — ABNORMAL LOW (ref 36.0–46.0)
Hemoglobin: 9.8 g/dL — ABNORMAL LOW (ref 12.0–15.0)
MCH: 30.4 pg (ref 26.0–34.0)
MCHC: 33.6 g/dL (ref 30.0–36.0)
MCV: 90.7 fL (ref 80.0–100.0)
Platelets: 461 10*3/uL — ABNORMAL HIGH (ref 150–400)
RBC: 3.22 MIL/uL — ABNORMAL LOW (ref 3.87–5.11)
RDW: 14.8 % (ref 11.5–15.5)
WBC: 7.6 10*3/uL (ref 4.0–10.5)
nRBC: 0 % (ref 0.0–0.2)

## 2023-07-12 LAB — GLUCOSE, CAPILLARY
Glucose-Capillary: 146 mg/dL — ABNORMAL HIGH (ref 70–99)
Glucose-Capillary: 188 mg/dL — ABNORMAL HIGH (ref 70–99)

## 2023-07-12 LAB — MAGNESIUM: Magnesium: 1.5 mg/dL — ABNORMAL LOW (ref 1.7–2.4)

## 2023-07-12 MED ORDER — FLUTICASONE-SALMETEROL 115-21 MCG/ACT IN AERO: 2.0000 | INHALATION_SPRAY | Freq: Two times a day (BID) | RESPIRATORY_TRACT | 12 refills | Status: DC

## 2023-07-12 MED ORDER — HYDROCODONE-ACETAMINOPHEN 5-325 MG PO TABS: 1.0000 | ORAL_TABLET | ORAL | 0 refills | Status: AC | PRN

## 2023-07-12 MED ORDER — GUAIFENESIN 100 MG/5ML PO LIQD: 10.0000 mL | Freq: Four times a day (QID) | ORAL | 0 refills | Status: DC

## 2023-07-12 MED ORDER — CYCLOBENZAPRINE HCL 5 MG PO TABS: 5.0000 mg | ORAL_TABLET | Freq: Three times a day (TID) | ORAL | 0 refills | Status: DC | PRN

## 2023-07-12 MED ORDER — POLYETHYLENE GLYCOL 3350 17 G PO PACK: 17.0000 g | PACK | Freq: Every day | ORAL | 0 refills | Status: DC | PRN

## 2023-07-12 MED ORDER — LINEZOLID 600 MG PO TABS: 600.0000 mg | ORAL_TABLET | Freq: Two times a day (BID) | ORAL | 0 refills | Status: AC

## 2023-07-12 MED ORDER — MAGNESIUM SULFATE 2 GM/50ML IV SOLN
2.0000 g | Freq: Once | INTRAVENOUS | Status: AC
  Administered 2023-07-12: 2 g via INTRAVENOUS
  Filled 2023-07-12: qty 50

## 2023-07-12 MED ORDER — MAGNESIUM 200 MG PO TABS: 1.0000 | ORAL_TABLET | Freq: Two times a day (BID) | ORAL | 0 refills | Status: DC

## 2023-07-12 NOTE — Discharge Summary (Signed)
 Physician Discharge Summary   Patient: Jade Perez MRN: [id number] DOB: 05-08-1955  Admit date:     06/30/2023  Discharge date: 07/12/23  Discharge Physician: Alba Cory   PCP: Abner Greenspan, MD   Recommendations at discharge:    Follow up with Pulmonologist for Chest x ray.   Discharge Diagnoses: Principal Problem:   CAP (community acquired pneumonia) Active Problems:   Intrinsic asthma   Type 2 diabetes mellitus without complication, without long-term current use of insulin (HCC)   Acute respiratory failure with hypoxia (HCC)   Hypokalemia   Hypertension   Depression   OSA (obstructive sleep apnea)   Hyperlipidemia, mixed   Pleural effusion  Resolved Problems:   * No resolved hospital problems. [facility] Course: 68 year old with past medical history significant for hypertension, hyperlipidemia, diabetes type 2, OSA, depression, asthma, non-STEMI with normal coronary arteries, admitted with shortness of breath and cough.  She was diagnosed with left lower pneumonia outpatient on 3/22 but returned due to worsening of chest pain and discomfort.  CTA chest was negative for PE but does show left lower lobe consolidation.  She was started on ceftriaxone and azithromycin.  Unfortunately chest x-ray showed worsening left-sided pleural effusion, thoracentesis 68 year old 320 cc of fluids.  CT was performed subsequently and show concern for loculation, chest tube was placed 3/28.  Patient had received 3 doses of lytic since then.  Repeated CT chest on 4/1 show mild improvement.   3/27-thoracentesis yielding 320 cc 3/28-persistent loculated effusions noted on CT chest and chest tube was placed 3/29, 3/30, 3/31 -received 3 doses of lytics once daily 4/1 -repeat CT chest shows improvement in effusion.  Only small loculations 4/03 went to IR for repositioning of chest tube 4/04 chest tube removed.   Assessment and Plan: 1-Left lower lobe community-acquired pneumonia, MRSA  pneumonia Left-sided pleural effusion -Patient failed outpatient treatment.  During hospitalization she has also received IV ceftriaxone and azithromycin without significant improvement. -Initial CTA showed left lower lobe consolidation but later repeated chest x-ray showed worsening pleural effusion requiring thoracentesis 3/27 yielding 320 cc of fluids without significant improvement -Repeated CT chest showed persistent pleural effusion with concern for loculation for which chest tube was placed on 3/28 -Has received 3 doses of lytics -Cultures grew MRSA, therefore patient was started on linezolid -CT chest/1 showed improved fluid and loculation. -Pulmonology is following, recommend IR eval for repositioning of chest tube.  -4/03; went to IR for reposition of chest tube.  4/04 pulmonologist recommend to remove Chest tube.  Stable or discharge, discharge on 5 more days linezolid.  Follow up with Pulmonologist for Chest x ray/   2-Hypokalemia/hypophosphatemia: Replaced.    Hyponatremia: Resolved    Essential hypertension: -Continue with Norvasc.    Hyperlipidemia: -Continue with Lipitor.    Diabetes type 2 uncontrolled hyperglycemia: Continue with Amaryl, Semglee and Novolog   History of asthma and OSA: PRN nebulizer.    Depression: Continue with Celexa     Smudge cells - Smudge cells noted on peripheral smear, percentage not specified  - Could be d/t the infection but she will need further workup with flow cytometry if leukocytosis persists  -Leukocytosis has resolved   Estimated body mass index is 36.58 kg/m as calculated from the following:   Height as of this encounter: 5\' 2"  (1.575 m).   Weight as of this encounter: 90.7 kg.          Consultants: CCM Procedures performed: none  Disposition: Home Diet recommendation:  Discharge Diet Orders (From admission, onward)     Start     Ordered   07/12/23 0000  Diet - low sodium heart healthy        07/12/23 1312            Cardiac diet DISCHARGE MEDICATION: Allergies as of 07/12/2023       Reactions   Amoxicillin Hives   Did it involve swelling of the face/tongue/throat, SOB, or low BP? No Did it involve sudden or severe rash/hives, skin peeling, or any reaction on the inside of your mouth or nose? No Did you need to seek medical attention at a hospital or doctor's office? No When did it last happen?      15-20 years ago If all above answers are "NO", may proceed with cephalosporin use.   Aspirin Swelling   Bee Venom Hives, Other (See Comments)   cellulitis   Clarithromycin Nausea And Vomiting   Erythromycin Nausea Only   Ibuprofen Swelling   Oxycodone Itching   Sulfa Antibiotics Hives   Latex Rash   Tape Rash, Other (See Comments)   Band-Aids, also = blisters        Medication List     STOP taking these medications    cephALEXin 500 MG capsule Commonly known as: KEFLEX       TAKE these medications    acetaminophen 500 MG tablet Commonly known as: TYLENOL Take 500 mg by mouth every 6 (six) hours as needed for mild pain.   albuterol 108 (90 Base) MCG/ACT inhaler Commonly known as: ProAir HFA Can inhale two puffs every four to six hours as needed for cough or wheeze.   amLODipine 5 MG tablet Commonly known as: NORVASC Take 1 tablet (5 mg total) by mouth daily.   atorvastatin 40 MG tablet Commonly known as: LIPITOR Take 40 mg by mouth at bedtime.   b complex vitamins tablet Take 1 tablet by mouth at bedtime.   cetirizine 10 MG tablet Commonly known as: ZYRTEC Take 10 mg by mouth daily.   citalopram 40 MG tablet Commonly known as: CELEXA Take 20 mg by mouth daily.   cyclobenzaprine 5 MG tablet Commonly known as: FLEXERIL Take 1 tablet (5 mg total) by mouth 3 (three) times daily as needed for muscle spasms.   Farxiga 10 MG Tabs tablet Generic drug: dapagliflozin propanediol Take 10 mg by mouth daily.   fenofibrate 145 MG tablet Commonly known as:  TRICOR Take 145 mg by mouth at bedtime.   fluticasone-salmeterol 115-21 MCG/ACT inhaler Commonly known as: Advair HFA Inhale 2 puffs into the lungs 2 (two) times daily.   glimepiride 1 MG tablet Commonly known as: AMARYL Take 0.5 mg by mouth 2 (two) times daily.   guaiFENesin 100 MG/5ML liquid Commonly known as: ROBITUSSIN Take 10 mLs by mouth every 6 (six) hours.   HYDROcodone-acetaminophen 5-325 MG tablet Commonly known as: NORCO/VICODIN Take 1 tablet by mouth every 4 (four) hours as needed for up to 5 days for moderate pain (pain score 4-6).   indapamide 1.25 MG tablet Commonly known as: LOZOL Take 1.25 mg by mouth daily.   linezolid 600 MG tablet Commonly known as: ZYVOX Take 1 tablet (600 mg total) by mouth every 12 (twelve) hours for 5 days.   Magnesium 200 MG Tabs Take 1 tablet (200 mg total) by mouth 2 (two) times daily.   montelukast 10 MG tablet Commonly known as: SINGULAIR TAKE 1 TABLET BY MOUTH EVERY DAY What changed: when to take  this   nystatin cream Commonly known as: MYCOSTATIN Apply 1 Application topically as needed for dry skin (itching, burning, redness).   omeprazole 40 MG capsule Commonly known as: PRILOSEC TAKE 1 CAPSULE BY MOUTH EVERY DAY IN THE MORNING BEFORE BREAKFAST What changed: See the new instructions.   polyethylene glycol 17 g packet Commonly known as: MIRALAX / GLYCOLAX Take 17 g by mouth daily as needed for mild constipation.   Semaglutide (1 MG/DOSE) 2 MG/1.5ML Sopn Inject 2 mg into the skin once a week. Wednesday's   SLOW FE PO Take 1 tablet by mouth daily.        Follow-up Information     [facility] Follow up.   Specialty: Home Health Services Why: [facility] will be providing your home health physical therapy services. Contact information: [address] [place] [address] [phone]         Abner Greenspan, MD Follow up in 1 week(s).   Specialty: Family Medicine Contact  information: [address] [place] [address] [phone]                Discharge Exam: Ceasar Mons Weights   06/30/23 0046  Weight: 90.7 kg   General; NAD  Condition at discharge: stable  The results of significant diagnostics from this hospitalization (including imaging, microbiology, ancillary and laboratory) are listed below for reference.   Imaging Studies: DG CHEST PORT 1 VIEW Result Date: 07/11/2023 CLINICAL DATA:  Left-sided chest tube. EXAM: PORTABLE CHEST 1 VIEW COMPARISON:  July 10, 2023. FINDINGS: Stable position of left-sided chest tube. Stable moderate size left pleural effusion is noted. Right lung is clear. No definite pneumothorax. IMPRESSION: Stable left-sided chest tube with moderate size left pleural effusion. Electronically Signed   By: Lupita Raider M.D.   On: 07/11/2023 12:10   IR Catheter Tube Change Result Date: 07/10/2023 CLINICAL DATA:  Pleural catheter evaluation. EXAM: FLUOROSCOPIC-GUIDED LEFT NON TUNNELED PLEURAL CATHETER EVALUATION AND EXCHANGE. COMPARISON:  CT chest 07/08/2023 and 07/04/2023. CONTRAST:  None MEDICATIONS: None. ANESTHESIA/SEDATION: Moderate (conscious) sedation was employed during this procedure. A total of Versed 4 mg and Fentanyl 100 mcg was administered intravenously. Moderate Sedation Time: 30 minutes. The patient's level of consciousness and vital signs were monitored continuously by radiology nursing throughout the procedure under my direct supervision. FLUOROSCOPY TIME:  Fluoroscopic dose; 34 mGy TECHNIQUE: Patient was positioned supine on the fluoroscopy table. The external portion of the existing percutaneous drainage catheter as well as the surrounding skin was prepped and draped in usual sterile fashion. A preprocedural spot fluoroscopic image was obtained of the existing percutaneous drainage catheter in various obliquities. The external portion of the percutaneous drainage catheter was cut and cannulated with  a guidewire. Access was attempted to the basilar chest, however could not be obtained. Under intermittent fluoroscopic guidance, the existing percutaneous drainage catheter was exchanged for a new 12 Fr 40 cm percutaneous drainage catheter with end coiled and locked within the LEFT apical chest. The percutaneous drainage catheter was connected to a pleura vac and secured in place within interrupted suture and a StatLock device. A dressing was applied. The patient tolerated the procedure well without immediate postprocedural complication. FINDINGS: *Well-functioning LEFT non tunneled pleural drainage catheter. *Attempted repositioning to the basilar chest, aborted. *Drainage catheter exchange for a new 12 Fr 40 cm pleural drain. IMPRESSION: 1. Fluoroscopic-guided LEFT non tunneled pleural catheter evaluation and exchange. 2. Unsuccessful repositioning to the basilar chest. PLAN: .  Thrombolytics  by pulmonary team. Roanna Banning, MD [facility] Electronically Signed   By: Roanna Banning M.D.   On: 07/10/2023 17:25   DG CHEST PORT 1 VIEW Result Date: 07/10/2023 CLINICAL DATA:  Chest tube placement EXAM: PORTABLE CHEST 1 VIEW COMPARISON:  X-ray 07/09/2023 and older FINDINGS: Left chest tube in place. Change position with the pigtail now the left lung apex. Persistent moderate left effusion and adjacent lung opacity. Persistent right basilar atelectasis or scar. No right-sided effusion or consolidation or pneumothorax. Enlarged cardiopericardial silhouette. Calcified aorta. Degenerative changes of the spine. Film is rotated to left. IMPRESSION: Change position of the left pigtail catheter with tip now at the left lung apex. Stable left-sided pleural effusion and opacity. No clear definable pneumothorax at this time Electronically Signed   By: Karen Kays M.D.   On: 07/10/2023 17:19   DG Chest Port 1 View Result Date: 07/09/2023 CLINICAL DATA:  [id number] with  pleural effusion, left chest tube. EXAM: PORTABLE CHEST 1 VIEW COMPARISON:  Portable chest yesterday at 8:49 a.m. FINDINGS: 5:10 a.m. Low inspiration on exam as before. Left chest tube with pigtail at the mid chest is unchanged in position. There is no measurable pneumothorax. Moderate-to-large left pleural effusion obscures all but the apical third of the left lung. Cardiomediastinal silhouette is stable. There is mild central vascular prominence. No edema. Right lung, apical left lung clear aside from an atelectatic band in the right base. Thoracic spondylosis. Overall aeration seems unchanged.  No new abnormality. IMPRESSION: 1. Stable left chest tube. No measurable pneumothorax. 2. Moderate-to-large left pleural effusion obscures all but the apical third of the left lung. 3. No new abnormality.  Fall Electronically Signed   By: Almira Bar M.D.   On: 07/09/2023 06:21   CT CHEST WO CONTRAST Result Date: 07/08/2023 CLINICAL DATA:  Pleural effusion.  Blood in chest tube. EXAM: CT CHEST WITHOUT CONTRAST TECHNIQUE: Multidetector CT imaging of the chest was performed following the standard protocol without IV contrast. RADIATION DOSE REDUCTION: This exam was performed according to the departmental dose-optimization program which includes automated exposure control, adjustment of the mA and/or kV according to patient size and/or use of iterative reconstruction technique. COMPARISON:  CT chest 07/04/2023. FINDINGS: Cardiovascular: No significant vascular findings. Normal heart size. No pericardial effusion. Mediastinum/Nodes: Visualized thyroid gland is within normal limits. There is a precarinal mildly enlarged lymph node measuring 11 mm there is an enlarged subcarinal lymph node measuring 11 mm. Difficult to assess for hilar adenopathy. No significant interval change. There is a small hiatal hernia. The esophagus is nondilated. Lungs/Pleura: There is a small left pleural effusion which appears slightly loculated,  but is mostly dependent. This has decreased in size in the interval. There is a new chest tube in the anterior left chest. The chest tube is located in the region without pleural fluid. There is atelectasis and consolidation of the left lower lobe similar to the prior study. There is a trace layering right pleural effusion, unchanged. Atelectasis/airspace disease in the inferior right lower lobe is new from prior. Trachea and central airways are within normal limits. Upper Abdomen: No acute abnormality. Musculoskeletal: No chest wall mass or suspicious bone lesions identified. IMPRESSION: 1. Small left pleural effusion which appears slightly loculated, but is mostly dependent. This has decreased in size in the interval. 2. New anterior left chest tube in place. The chest tube is located in the region without pleural fluid. 3. Stable trace right pleural effusion. 4. Stable  atelectasis and consolidation of the left lower lobe. 5. New atelectasis/airspace disease in the inferior right lower lobe. 6. Stable mild mediastinal adenopathy. Electronically Signed   By: Darliss Cheney M.D.   On: 07/08/2023 15:22   DG Chest Port 1 View Result Date: 07/08/2023 CLINICAL DATA:  Dyspnea.  Follow-up pleural effusion EXAM: PORTABLE CHEST 1 VIEW COMPARISON:  07/07/2023 and older FINDINGS: Underinflation. No pneumothorax or edema. Increasing bandlike opacity right lung base. Favor atelectasis. Persistent left-sided pleural effusion and opacity with pigtail catheter. No pneumothorax. Stable cardiac silhouette. Film is under penetrated. IMPRESSION: No significant interval change. Electronically Signed   By: Karen Kays M.D.   On: 07/08/2023 13:09   DG Chest Port 1 View Result Date: 07/07/2023 CLINICAL DATA:  Pleural effusion. EXAM: PORTABLE CHEST 1 VIEW COMPARISON:  X-ray 07/06/2023 and older FINDINGS: Stable left-sided pigtail catheter. Decreasing left effusion with significant residual. Associated parenchymal opacity. Decreasing  right basilar atelectasis. No pneumothorax or edema. Stable cardiopericardial silhouette. Film is under penetrated. Degenerative changes along the spine IMPRESSION: Decreasing left pleural effusion with significant residual. No pneumothorax Electronically Signed   By: Karen Kays M.D.   On: 07/07/2023 11:14   DG CHEST PORT 1 VIEW Result Date: 07/06/2023 CLINICAL DATA:  [id number] Chest tube in place [id number] EXAM: PORTABLE CHEST 1 VIEW COMPARISON:  07/05/2023 FINDINGS: Left chest tube remains in place. Large left-sided pleural effusion has increased. Associated left basilar opacity. Band-like atelectasis at the right lung base. No pneumothorax. Stable heart size. IMPRESSION: Large left-sided pleural effusion has increased. Left chest tube remains in place. Electronically Signed   By: Duanne Guess D.O.   On: 07/06/2023 10:14   DG CHEST PORT 1 VIEW Result Date: 07/05/2023 CLINICAL DATA:  Productive cough.  Chest tube in place. EXAM: PORTABLE CHEST 1 VIEW COMPARISON:  CT chest from yesterday. Chest x-ray dated July 03, 2023. FINDINGS: Decreased now moderate loculated left pleural effusion status post chest tube placement. No pneumothorax. Improved aeration of the left upper lobe. Continued consolidation and/or atelectasis in the left lower lobe. New mild right basilar atelectasis. No acute osseous abnormality. IMPRESSION: 1. Decreased now moderate loculated left pleural effusion status post chest tube placement. No pneumothorax. 2. Improved aeration of the left upper lobe. Continued consolidation and/or atelectasis in the left lower lobe. Electronically Signed   By: Obie Dredge M.D.   On: 07/05/2023 11:55   CT GUIDED SOFT TISSUE FLUID DRAIN BY PERC CATH Result Date: 07/05/2023 INDICATION: 68 year old female with complicated left pneumonia and parapneumonic effusion. EXAM: CT-guided placement of chest tube TECHNIQUE: Multidetector CT imaging of the chest was performed following the standard protocol  without IV contrast. RADIATION DOSE REDUCTION: This exam was performed according to the departmental dose-optimization program which includes automated exposure control, adjustment of the mA and/or kV according to patient size and/or use of iterative reconstruction technique. MEDICATIONS: None ANESTHESIA/SEDATION: None.  Patient was given Versed IV for anxiolysis. COMPLICATIONS: None immediate. PROCEDURE: Informed written consent was obtained from the patient after a thorough discussion of the procedural risks, benefits and alternatives. All questions were addressed. Maximal Sterile Barrier Technique was utilized including caps, mask, sterile gowns, sterile gloves, sterile drape, hand hygiene and skin antiseptic. A timeout was performed prior to the initiation of the procedure. A CT scan of the chest was performed. The loculated fluid was identified. A suitable skin entry site was selected and marked. The skin was sterilely prepped and draped in standard fashion using chlorhexidine skin prep. Local anesthesia was attained by  infiltration with 1% lidocaine. A small dermatotomy was made. Under intermittent CT guidance, an 18 gauge trocar needle was carefully advanced over the rib and into the fluid from an anterior approach. A 0.035 wire was then coiled in the fluid collection. The tract was dilated to 89 [place]. A 12 [place] skater drain was advanced over the wire and formed in the fluid. There was return of yellow pleural fluid. The catheter was secured in place with an 0 Prolene suture and connected to low wall suction via a pleur-evac device. An air tight bandage was applied. IMPRESSION: Successful placement of a 72 French thoracostomy tube using CT guidance via a left anterior approach. Electronically Signed   By: Malachy Moan M.D.   On: 07/05/2023 06:55   CT CHEST WO CONTRAST Result Date: 07/04/2023 CLINICAL DATA:  Empyema. Recently diagnosed with left lower lobe pneumonia with worsening chest pain and  enlarging pleural effusion. Thoracentesis performed yesterday. History of diabetes and hypertension. EXAM: CT CHEST WITHOUT CONTRAST TECHNIQUE: Multidetector CT imaging of the chest was performed following the standard protocol without IV contrast. RADIATION DOSE REDUCTION: This exam was performed according to the departmental dose-optimization program which includes automated exposure control, adjustment of the mA and/or kV according to patient size and/or use of iterative reconstruction technique. COMPARISON:  Radiographs 07/03/2023 and 06/30/2023. Chest CTA 06/30/2023. FINDINGS: Cardiovascular: No significant vascular findings on noncontrast imaging. Mild aortic and coronary artery atherosclerosis noted. The heart size is normal. There is no pericardial effusion. Mediastinum/Nodes: Stable mildly prominent subcarinal and hilar lymph nodes bilaterally, likely reactive. Small hiatal hernia.The thyroid gland and trachea appear unremarkable. Lungs/Pleura: Moderate to large left pleural effusion has significantly enlarged from the previous CT of 4 days ago and has loculated components laterally measuring approximately 13.8 x 5.5 x 11.5 cm, consistent with reported empyema. There is resulting increased compressive atelectasis of the left lower lobe and lingula. Underlying left lower lobe consolidation. No evidence of pneumothorax. Trace dependent right pleural effusion. The right lung is clear. Upper abdomen: No acute findings are seen in the visualized upper abdomen. Hepatic steatosis noted. Possible punctate nonobstructing bilateral renal calculi. Musculoskeletal/Chest wall: There is no chest wall mass or suspicious osseous finding. IMPRESSION: 1. Significant enlargement of left pleural effusion with loculated components laterally, consistent with reported empyema. This effusion is suboptimally characterized by this noncontrast study. Consider chest tube placement. 2. Resulting increased compressive atelectasis of the  left lower lobe and lingula. Underlying left lower lobe consolidation consistent with pneumonia. 3. Trace dependent right pleural effusion.  Clear right lung. 4. Stable mildly prominent mediastinal and hilar lymph nodes, likely reactive. 5. Hepatic steatosis. Possible punctate nonobstructing bilateral renal calculi. 6.  Aortic Atherosclerosis (ICD10-I70.0). Electronically Signed   By: Carey Bullocks M.D.   On: 07/04/2023 14:58   DG Chest 1 View Result Date: 07/03/2023 CLINICAL DATA:  Status post left thoracentesis. EXAM: CHEST  1 VIEW COMPARISON:  07/03/2023 at 0903 hours FINDINGS: Aeration in the left lung has mildly improved. There continues to be a large amount of consolidation or pleural fluid in the mid and lower left hemithorax. Negative for a pneumothorax. Right lung is clear. Cardiac silhouette is obscured by the densities in left lower chest. IMPRESSION: 1. Mild improvement in aeration in the left lung. Negative for a pneumothorax. 2. Large amount of consolidation or loculated pleural fluid in the left chest. Electronically Signed   By: Richarda Overlie M.D.   On: 07/03/2023 15:11   US THORACENTESIS ASP PLEURAL SPACE W/IMG GUIDE  Result Date: 07/03/2023 INDICATION: Dyspnea.  Pneumonia. EXAM: ULTRASOUND GUIDED LEFT THORACENTESIS MEDICATIONS: None. COMPLICATIONS: None immediate. PROCEDURE: An ultrasound guided thoracentesis was thoroughly discussed with the patient and questions answered. The benefits, risks, alternatives and complications were also discussed. The patient understands and wishes to proceed with the procedure. Written consent was obtained. Ultrasound was performed to localize and mark an adequate pocket of fluid in the left posterior chest. The area was then prepped and draped in the normal sterile fashion. 1% Lidocaine was used for local anesthesia. Under ultrasound guidance a 6 Fr Safe-T-Centesis catheter was introduced. Thoracentesis was performed. The catheter was removed and a dressing  applied. FINDINGS: A total of approximately 320 mL of clear yellow fluid was removed. Samples were sent to the laboratory as requested by the clinical team. Pleural catheter stopped draining despite ultrasound showing residual pleural fluid. Findings compatible with loculated pleural fluid. IMPRESSION: Successful ultrasound guided left thoracentesis yielding 320 mL of pleural fluid. Electronically Signed   By: Richarda Overlie M.D.   On: 07/03/2023 15:08   DG Chest Port 1 View Result Date: 07/03/2023 CLINICAL DATA:  Dyspnea. EXAM: PORTABLE CHEST 1 VIEW COMPARISON:  Chest two views 06/30/2023 and 06/28/2023; CT abdomen and pelvis 06/30/2023 FINDINGS: Interval worsening of homogeneous opacification of the inferior 80% of the left hemithorax, likely a combination of pleural fluid and airspace opacification/consolidation. The right lung remains clear. The visualized cardiac silhouette and mediastinal contours are within limits. Mild-to-moderate atherosclerotic calcifications within the aortic arch. No pleural effusion. No acute skeletal abnormality. IMPRESSION: Interval worsening of homogeneous opacification of the inferior 80% of the left hemithorax, likely a combination of pleural fluid and airspace opacification/consolidation. Electronically Signed   By: Neita Garnet M.D.   On: 07/03/2023 12:57   CT Angio Chest PE W and/or Wo Contrast Result Date: 06/30/2023 CLINICAL DATA:  Syncope or presyncope with cerebrovascular cause suspected. Increasing pain to the left lung and ribs. Worsening shortness of breath. Diagnosed yesterday with pneumonia. EXAM: CT ANGIOGRAPHY CHEST WITH CONTRAST TECHNIQUE: Multidetector CT imaging of the chest was performed using the standard protocol during bolus administration of intravenous contrast. Multiplanar CT image reconstructions and MIPs were obtained to evaluate the vascular anatomy. RADIATION DOSE REDUCTION: This exam was performed according to the departmental dose-optimization  program which includes automated exposure control, adjustment of the mA and/or kV according to patient size and/or use of iterative reconstruction technique. CONTRAST:  75mL OMNIPAQUE IOHEXOL 350 MG/ML SOLN COMPARISON:  Chest radiograph 06/30/2023. FINDINGS: Cardiovascular: Technically adequate study with good opacification of the central and segmental pulmonary arteries. No focal filling defects. No evidence of significant pulmonary embolus. Normal heart size. No pericardial effusions. Normal caliber thoracic aorta. No aortic dissection. Minimal aortic and coronary artery calcifications. Mediastinum/Nodes: Moderate esophageal hiatal hernia. Esophagus is decompressed. No significant lymphadenopathy. Thyroid gland is unremarkable. Lungs/Pleura: Dense consolidation in the left lower lung likely representing lobar pneumonia. Right lung is clear. No pleural effusion or pneumothorax. Upper Abdomen: Diffuse fatty infiltration of the liver. No acute changes. Musculoskeletal: No chest wall abnormality. No acute or significant osseous findings. Review of the MIP images confirms the above findings. IMPRESSION: 1. No evidence of significant pulmonary embolus. 2. Left lower lung consolidation likely representing lobar pneumonia. Follow-up to resolution is recommended to exclude underlying obstructing lesion. 3. Esophageal hiatal hernia. 4. Fatty infiltration of the liver. 5. Minimal aortic atherosclerosis. Electronically Signed   By: Burman Nieves M.D.   On: 06/30/2023 03:59   DG Chest 2 View Result Date: 06/30/2023  CLINICAL DATA:  Chest pain shortness of breath EXAM: CHEST - 2 VIEW COMPARISON:  06/28/2023 FINDINGS: Increasing left basilar airspace opacity is noted. Small left pleural effusion is now noted. Cardiac shadow is stable. Right lung remains clear. IMPRESSION: Increasing left lower lobe infiltrate with associated small effusion. Electronically Signed   By: Alcide Clever M.D.   On: 06/30/2023 02:32   DG Chest 2  View Result Date: 06/28/2023 CLINICAL DATA:  Cough Recent flu that turned into a sinus infection. Facial pressure and headache today, not controlled w/ tylenol. Crackles in left base. Non smoker. Asthmatic. Tested neg twice for strep/rsv/covid. EXAM: CHEST - 2 VIEW COMPARISON:  Chest x-ray 12/31/2021 FINDINGS: The heart and mediastinal contours are within normal limits. Question developing left lower lobe airspace opacity. No pulmonary edema. No pleural effusion. No pneumothorax. No acute osseous abnormality. IMPRESSION: question developing left lower lobe airspace opacity. Electronically Signed   By: Tish Frederickson M.D.   On: 06/28/2023 23:14    Microbiology: Results for orders placed or performed during the hospital encounter of 06/30/23  Blood culture (routine x 2)     Status: None   Collection Time: 06/30/23  1:03 AM   Specimen: BLOOD  Result Value Ref Range Status   Specimen Description   Final    BLOOD LEFT ANTECUBITAL Performed at [facility], [address] [place], [place] [address]    Special Requests   Final    BOTTLES DRAWN AEROBIC AND ANAEROBIC Blood Culture adequate volume Performed at [facility], [address] [place], [place] [address]    Culture   Final    NO GROWTH 5 DAYS Performed at [facility], [address], [place], [place] [address]    Report Status 07/05/2023 FINAL  Final  Blood culture (routine x 2)     Status: None   Collection Time: 06/30/23  1:03 AM   Specimen: BLOOD RIGHT FOREARM  Result Value Ref Range Status   Specimen Description   Final    BLOOD RIGHT FOREARM Performed at [facility], [address] [place], [place] [address]    Special Requests   Final    BOTTLES DRAWN AEROBIC AND ANAEROBIC Blood Culture adequate volume Performed at [facility], [address] [place], [place] [address]    Culture   Final    NO GROWTH 5 DAYS Performed at [facility], [address],  [place], [place] [address]    Report Status 07/05/2023 FINAL  Final  Expectorated Sputum Assessment w Gram Stain, Rflx to Resp Cult     Status: None   Collection Time: 07/02/23  4:43 PM   Specimen: Expectorated Sputum  Result Value Ref Range Status   Specimen Description EXPECTORATED SPUTUM  Final   Special Requests NONE  Final   Sputum evaluation   Final    THIS SPECIMEN IS ACCEPTABLE FOR SPUTUM CULTURE Performed at [facility], [address] [place], [place] [address]    Report Status 07/02/2023 FINAL  Final  Culture, Respiratory w Gram Stain     Status: None   Collection Time: 07/02/23  4:43 PM  Result Value Ref Range Status   Specimen Description   Final    EXPECTORATED SPUTUM Performed at [facility], [address], [place], [place] [address]    Special Requests   Final    NONE Reflexed from [id number] Performed at [facility], [address], [place], [place] [address]    Gram Stain   Final    FEW WBC PRESENT, PREDOMINANTLY PMN FEW GRAM POSITIVE COCCI RARE YEAST WITH PSEUDOHYPHAE RARE GRAM NEGATIVE RODS Performed at [facility], [address] [place], [place] [address]    Culture   Final    RARE METHICILLIN RESISTANT STAPHYLOCOCCUS AUREUS RARE CANDIDA ALBICANS    Report Status 07/06/2023 FINAL  Final   Organism ID, Bacteria METHICILLIN RESISTANT STAPHYLOCOCCUS AUREUS  Final      Susceptibility   Methicillin resistant staphylococcus aureus - MIC*    CIPROFLOXACIN >=8 RESISTANT Resistant     ERYTHROMYCIN >=8 RESISTANT Resistant     GENTAMICIN <=0.5 SENSITIVE Sensitive     OXACILLIN >=4 RESISTANT Resistant     TETRACYCLINE <=1 SENSITIVE Sensitive     VANCOMYCIN <=0.5 SENSITIVE Sensitive     TRIMETH/SULFA >=320 RESISTANT Resistant     CLINDAMYCIN >=8 RESISTANT Resistant     RIFAMPIN <=0.5 SENSITIVE Sensitive     Inducible Clindamycin NEGATIVE Sensitive     LINEZOLID 2 SENSITIVE Sensitive     * RARE  METHICILLIN RESISTANT STAPHYLOCOCCUS AUREUS  Fungus Culture With Stain     Status: None (Preliminary result)   Collection Time: 07/03/23  2:24 PM   Specimen: PATH Cytology Pleural fluid  Result Value Ref Range Status   Fungus Stain Final report  Final    Comment: (NOTE) Performed At: [facility] [address] [place], [place] [id number] Jolene Schimke MD QM:[id number]    Fungus (Mycology) Culture PENDING  Incomplete   Fungal Source PLEURAL  Final    Comment: Performed at [facility], [address], [place], [place] [address]  Culture, Fungus without Smear     Status: None (Preliminary result)   Collection Time: 07/03/23  2:24 PM   Specimen: PATH Cytology Pleural fluid  Result Value Ref Range Status   Specimen Description   Final    PLEURAL Performed at [facility], [address], [place], [place] [address]    Special Requests   Final    NONE Performed at [facility], [address] [place], [place] [address]    Culture   Final    NO FUNGUS ISOLATED AFTER 9 DAYS Performed at [facility], [address], [place], [place] [address]    Report Status PENDING  Incomplete  Body fluid culture w Gram Stain     Status: None   Collection Time: 07/03/23  2:24 PM   Specimen: PATH Cytology Pleural fluid  Result Value Ref Range Status   Specimen Description   Final    PLEURAL Performed at [facility], [address] [place], [place] [address]    Special Requests   Final    NONE Performed at [facility], [address] [place], [place] [address]    Gram Stain NO WBC SEEN NO ORGANISMS SEEN   Final   Culture   Final    NO GROWTH 3 DAYS Performed at [facility], [address] [place], [place] [address]    Report Status 07/06/2023 FINAL  Final  Fungus Culture Result     Status: None   Collection Time: 07/03/23  2:24 PM  Result Value Ref Range Status    Result 1 Comment  Final    Comment: (NOTE) KOH/Calcofluor preparation:  no fungus observed. Performed At: [facility] [address] [place], [place]  [id number] Jolene Schimke MD WU:[id number]   Respiratory (~20 pathogens) panel by PCR     Status: None   Collection Time: 07/04/23 10:04 AM   Specimen: Nasopharyngeal Swab; Respiratory  Result Value Ref Range Status   Adenovirus NOT DETECTED NOT DETECTED Final   Coronavirus 229E NOT DETECTED NOT DETECTED Final    Comment: (NOTE) The Coronavirus on the Respiratory Panel, DOES NOT test for the novel  Coronavirus (2019 nCoV)    Coronavirus HKU1 NOT DETECTED NOT DETECTED Final   Coronavirus NL63 NOT DETECTED NOT DETECTED Final   Coronavirus OC43 NOT DETECTED NOT DETECTED Final   Metapneumovirus NOT DETECTED NOT DETECTED Final   Rhinovirus / Enterovirus NOT DETECTED NOT DETECTED Final   Influenza A NOT DETECTED NOT DETECTED Final   Influenza B NOT DETECTED NOT DETECTED Final   Parainfluenza Virus 1 NOT DETECTED NOT DETECTED Final   Parainfluenza Virus 2 NOT DETECTED NOT DETECTED Final   Parainfluenza Virus 3 NOT DETECTED NOT DETECTED Final   Parainfluenza Virus 4 NOT DETECTED NOT DETECTED Final   Respiratory Syncytial Virus NOT DETECTED NOT DETECTED Final   Bordetella pertussis NOT DETECTED NOT DETECTED Final   Bordetella Parapertussis NOT DETECTED NOT DETECTED Final   Chlamydophila pneumoniae NOT DETECTED NOT DETECTED Final   Mycoplasma pneumoniae NOT DETECTED NOT DETECTED Final    Comment: Performed at [facility], [address], [place], [place] [address]    Labs: CBC: Recent Labs  Lab 07/07/23 [phone] 07/09/23 0557 07/10/23 0526 07/11/23 0550 07/12/23 0545  WBC 20.7* 9.6 9.4 8.2 7.6  HGB 13.8 10.7* 10.5* 9.7* 9.8*  HCT 42.0 31.4* 32.1* 29.2* 29.2*  MCV 90.3 89.0 92.2 89.0 90.7  PLT 529* 456* 474* 478* 461*   Basic Metabolic Panel: Recent Labs  Lab 07/07/23 0454 07/08/23 1602 07/09/23 0557  07/10/23 0526 07/11/23 0550 07/12/23 0545  NA 125* 130* 134* 135 135 137  K 4.1 4.3 4.2 4.3 4.1 4.0  CL 91* 94* 98 99 101 102  CO2 22 26 28 25 27 25   GLUCOSE 242* 249* 127* 144* 139* 121*  BUN 14 13 10 10 9 11   CREATININE 0.71 0.73 0.63 0.67 0.60 0.67  CALCIUM 8.5* 8.4* 8.3* 8.4* 8.4* 8.6*  MG 1.8  --  1.9 1.8 1.9 1.5*   Liver Function Tests: No results for input(s): "AST", "ALT", "ALKPHOS", "BILITOT", "PROT", "ALBUMIN" in the last 168 hours. CBG: Recent Labs  Lab 07/11/23 1242 07/11/23 1751 07/11/23 2108 07/12/23 0723 07/12/23 1121  GLUCAP 257* 182* 208* 146* 188*    Discharge time spent: greater than 30 minutes.  Signed: Alba Cory, MD [facility] 07/12/2023

## 2023-07-12 NOTE — Progress Notes (Signed)
 NAME:  Jade Perez, MRN:  [id number], DOB:  Jun 05, 1955, LOS: 12 ADMISSION DATE:  06/30/2023, CONSULTATION DATE:  07/04/2023 REFERRING MD:  DR Nelson Chimes, CHIEF COMPLAINT:  LLL white out    History of Present Illness:   68 year old female with past medical history of non-smoking, hypertension, hyperlipidemia, type 2 diabetes, OSA, depression, asthma not otherwise specified presented on 06/30/2023 with cough worsening dyspnea and pleuritic chest pain on the left lower lobe. PRior to that had sick resp contact with grandchild who live with her and then she got sick  CT chest showed left lower lobe consolidation with a small pleural effusion [personal visualization of the CCM MD on this consult] she was hypoxemic requiring oxygen.  She had leukocytosis with white count 20,100.  Pulmonary embolism was ruled out started on Treatment.  Subsequently urine strep and Legionella negative.  Respiratory virus panel for COVID, flu and RSV negative [20 panel not done].  Since admission has been afebrile.  Has improving leukocytosis from 20,002 currently 14,600.  Cultures remain negative.  On 07/03/2023 underwent left-sided thoracentesis removing 350 cc by interventional radiology.  This shows a simple exudate with LDH 633 and acute neutrophilic leukocytosis with polymorphs of 86%.  No eosinophils.  Despite all these interventions has remained on 4 L nasal cannula.  She has been covered by IV antibiotics with ceftriaxone and azithromycin since admission and due to and on 07/04/2023 [facility] have PCCM consult].  Of note prior to admission she was treated as outpatient with antibiotics and then failed that.   Pulmonary is now been consulted with a question of residual loculated effusion  Past Medical History:    has a past medical history of Allergic rhinitis, Asthma, mild intermittent, Complication of anesthesia, Depression, GAD (generalized anxiety disorder), GERD (gastroesophageal reflux disease), Hiatal hernia,  History of cardiomyopathy in adulthood (12/06/2021), History of heart murmur in childhood, History of kidney stones, History of non-ST elevation myocardial infarction (NSTEMI) (12/06/2021), Hyperlipidemia, mixed, Hypertension, IDA (iron deficiency anemia), OA (osteoarthritis), OSA (obstructive sleep apnea), Renal disorder, Type 2 diabetes mellitus (HCC) (2008), and Wears glasses.   reports that she has never smoked. She has never used smokeless tobacco.   Significant Hospital Events:  06/30/2023 - admit -> CT angiogram rule out pulmonary embolism.  Small left-sided pleural effusion.  Significant left lower lobe consolidation 07/03/2023: Worsening chest x-ray with significant whiteout on the left side.  S/p thoracentesis by IR draining only 320 cc.  Simple exudate -> post Thora chest x-ray without change 3/28 chest tube placed via IR 3/29 1st dose lytics after IR was able to troubleshoot tube after would not flush or aspirate  3/30 2nd dose of tube lytics 3/31 650 CT outpt; repeat 3rd dose lytics today 4/1 repeat chest CT 4/3 went for IR repositioning of CT 4/4-chest tube discontinued  Interim History / Subjective:  Only 60 cc from chest tube since repositioning Not having any pain or discomfort at present - Chest tube taken out 07/11/23  Objective   Blood pressure 122/66, pulse 70, temperature 98.1 F (36.7 C), resp. rate 20, height 5\' 2"  (1.575 m), weight 90.7 kg, SpO2 93%.        Intake/Output Summary (Last 24 hours) at 07/12/2023 1314 Last data filed at 07/12/2023 1200 Gross per 24 hour  Intake 500 ml  Output --  Net 500 ml   Filed Weights   06/30/23 0046  Weight: 90.7 kg   Examination: General: Does not appear to be in distress HEENT: Moist  oral mucosa neuro: Awake alert interactive CV: S1-S2 appreciated PULM: Decreased air movement at the left base Extremities: warm/dry, no LE edema    Resolved Hospital Problem list   N/a  Assessment & Plan:   Parapneumonic  effusion S/p chest tube and fibrinolytic x 3 Chest tube manipulation and replacement 4/3 Chest tube was removed 4/4  Can be discharged home today  Will see as outpatient in the next 2 to 3 weeks  Will need a CT scan in about 4 to 6 weeks to follow infiltrate to resolution  Encourage graded activities  Encouraged to continue using incentive spirometer and Pep device to clear secretions  Discussed with Dr. Sunnie Nielsen

## 2023-07-12 NOTE — Plan of Care (Addendum)
 VSS. Pain managed with PRN Flexeril and Norco. Patient O2 sat upper 90s on room air. Placed on 2L [place] at bedtime per patient request. BG 208 at bedtime. Husband remains at bedside. No acute events overnight.   Problem: Education: Goal: Knowledge of General Education information will improve Description: Including pain rating scale, medication(s)/side effects and non-pharmacologic comfort measures Outcome: Progressing   Problem: Clinical Measurements: Goal: Will remain free from infection Outcome: Progressing   Problem: Pain Managment: Goal: General experience of comfort will improve and/or be controlled Outcome: Progressing   Problem: Safety: Goal: Ability to remain free from injury will improve Outcome: Progressing   Problem: Clinical Measurements: Goal: Ability to maintain a body temperature in the normal range will improve Outcome: Progressing   Problem: Respiratory: Goal: Ability to maintain adequate ventilation will improve Outcome: Progressing   Problem: Metabolic: Goal: Ability to maintain appropriate glucose levels will improve Outcome: Progressing   Problem: Nutritional: Goal: Maintenance of adequate nutrition will improve Outcome: Progressing

## 2023-07-12 NOTE — Progress Notes (Signed)
SATURATION QUALIFICATIONS: (This note is used to comply with regulatory documentation for home oxygen)  Patient Saturations on Room Air at Rest = 95%  Patient Saturations on Room Air while Ambulating = 92%   

## 2023-07-12 NOTE — Progress Notes (Signed)
 Mobility Specialist - Progress Note Nurse requested Mobility Specialist to perform oxygen saturation test with pt which includes removing pt from oxygen both at rest and while ambulating.  Below are the results from that testing.     Patient Saturations on Room Air at Rest = spO2 95%  Patient Saturations on Room Air while Ambulating = sp02 92% .   Patient Saturations on n/a Liters of oxygen while Ambulating = sp02 n/a%  At end of testing pt left in room on RA.  Reported results to nurse.     07/12/23 1040  Mobility  Activity Ambulated independently in hallway  Level of Assistance Standby assist, set-up cues, supervision of patient - no hands on  Assistive Device None  Distance Ambulated (ft) 200 ft  Range of Motion/Exercises Active  Activity Response Tolerated well  Mobility Referral Yes  Mobility visit 1 Mobility  Mobility Specialist Start Time (ACUTE ONLY) 1030  Mobility Specialist Stop Time (ACUTE ONLY) 1040  Mobility Specialist Time Calculation (min) (ACUTE ONLY) 10 min   Pt was found in bed and agreeable to ambulate. No complaints with session. At EOS returned to bed with all needs met. Call bell in reach and husband in room.  Billey Chang Mobility Specialist

## 2023-07-17 DIAGNOSIS — Z6833 Body mass index (BMI) 33.0-33.9, adult: Secondary | ICD-10-CM | POA: Diagnosis not present

## 2023-07-17 DIAGNOSIS — J189 Pneumonia, unspecified organism: Secondary | ICD-10-CM | POA: Diagnosis not present

## 2023-07-17 DIAGNOSIS — J90 Pleural effusion, not elsewhere classified: Secondary | ICD-10-CM | POA: Diagnosis not present

## 2023-07-17 DIAGNOSIS — Z7689 Persons encountering health services in other specified circumstances: Secondary | ICD-10-CM | POA: Diagnosis not present

## 2023-07-17 DIAGNOSIS — E1169 Type 2 diabetes mellitus with other specified complication: Secondary | ICD-10-CM | POA: Diagnosis not present

## 2023-07-18 ENCOUNTER — Telehealth: Payer: Self-pay

## 2023-07-18 DIAGNOSIS — J189 Pneumonia, unspecified organism: Secondary | ICD-10-CM

## 2023-07-18 NOTE — Progress Notes (Signed)
 Complex Care Management Note  Care Guide Note 07/18/2023 Name: Madelyn Tlatelpa MRN: [id number] DOB: 02-02-1956  Charnae Lill is a 68 y.o. year old female who sees Abner Greenspan, MD for primary care. I reached out to Tamala Fothergill by phone today to offer complex care management services.  Ms. Segoviano was given information about Complex Care Management services today including:   The Complex Care Management services include support from the care team which includes your Nurse Care Manager, Clinical Social Worker, or Pharmacist.  The Complex Care Management team is here to help remove barriers to the health concerns and goals most important to you. Complex Care Management services are voluntary, and the patient may decline or stop services at any time by request to their care team member.   Complex Care Management Consent Status: Patient did not agree to participate in complex care management services at this time.  Follow up plan:  Patient stated she will would follow up with PCP as needed.   Encounter Outcome:  Patient Refused  Baruch Gouty [facility], [facility] Health Care Management Assistant Direct Dial: [phone]  Fax: [phone]

## 2023-07-24 DIAGNOSIS — Z6833 Body mass index (BMI) 33.0-33.9, adult: Secondary | ICD-10-CM | POA: Diagnosis not present

## 2023-07-24 DIAGNOSIS — E119 Type 2 diabetes mellitus without complications: Secondary | ICD-10-CM | POA: Diagnosis not present

## 2023-07-24 DIAGNOSIS — M2041 Other hammer toe(s) (acquired), right foot: Secondary | ICD-10-CM | POA: Diagnosis not present

## 2023-07-24 DIAGNOSIS — L84 Corns and callosities: Secondary | ICD-10-CM | POA: Diagnosis not present

## 2023-07-24 DIAGNOSIS — M21611 Bunion of right foot: Secondary | ICD-10-CM | POA: Diagnosis not present

## 2023-07-24 DIAGNOSIS — B372 Candidiasis of skin and nail: Secondary | ICD-10-CM | POA: Diagnosis not present

## 2023-07-24 LAB — CULTURE, FUNGUS WITHOUT SMEAR

## 2023-07-31 DIAGNOSIS — Z6834 Body mass index (BMI) 34.0-34.9, adult: Secondary | ICD-10-CM | POA: Diagnosis not present

## 2023-07-31 DIAGNOSIS — R61 Generalized hyperhidrosis: Secondary | ICD-10-CM | POA: Diagnosis not present

## 2023-07-31 DIAGNOSIS — J90 Pleural effusion, not elsewhere classified: Secondary | ICD-10-CM | POA: Diagnosis not present

## 2023-07-31 LAB — FUNGUS CULTURE WITH STAIN

## 2023-07-31 LAB — FUNGAL ORGANISM REFLEX

## 2023-07-31 LAB — FUNGUS CULTURE RESULT

## 2023-08-06 DIAGNOSIS — E1169 Type 2 diabetes mellitus with other specified complication: Secondary | ICD-10-CM | POA: Diagnosis not present

## 2023-08-06 DIAGNOSIS — D485 Neoplasm of uncertain behavior of skin: Secondary | ICD-10-CM | POA: Diagnosis not present

## 2023-08-07 DIAGNOSIS — Z6835 Body mass index (BMI) 35.0-35.9, adult: Secondary | ICD-10-CM | POA: Diagnosis not present

## 2023-08-07 DIAGNOSIS — E1169 Type 2 diabetes mellitus with other specified complication: Secondary | ICD-10-CM | POA: Diagnosis not present

## 2023-08-12 ENCOUNTER — Encounter: Payer: Self-pay | Admitting: Cardiology

## 2023-08-12 ENCOUNTER — Ambulatory Visit: Payer: Medicare PPO | Attending: Cardiology | Admitting: Cardiology

## 2023-08-12 VITALS — BP 116/70 | HR 72 | Ht 62.0 in | Wt 183.0 lb

## 2023-08-12 DIAGNOSIS — E119 Type 2 diabetes mellitus without complications: Secondary | ICD-10-CM

## 2023-08-12 DIAGNOSIS — I493 Ventricular premature depolarization: Secondary | ICD-10-CM

## 2023-08-12 DIAGNOSIS — G4733 Obstructive sleep apnea (adult) (pediatric): Secondary | ICD-10-CM | POA: Diagnosis not present

## 2023-08-12 DIAGNOSIS — Z79899 Other long term (current) drug therapy: Secondary | ICD-10-CM

## 2023-08-12 DIAGNOSIS — I10 Essential (primary) hypertension: Secondary | ICD-10-CM

## 2023-08-12 NOTE — Progress Notes (Signed)
 Cardiology Office Note:    Date:  08/15/2023   ID:  Jade Perez, DOB Jan 01, 1956, MRN [id number]  PCP:  Lonie Roa, MD  Cardiologist:  Jerryl Morin, DO  Electrophysiologist:  None   Referring MD: Lonie Roa, MD   "I am having some chest pain"  History of Present Illness:    Jade Perez is a 68 y.o. 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   Her last visit with me was a video visit - at that time we discussed her monitor results.   She was hospitalized for thirteen days due to pneumonia, initially presenting with a cough, chills, headache, and difficulty breathing. A chest x-ray revealed a partially filled lung, leading to IV antibiotics and a chest tube. During her hospital stay, she was on a heart monitor and experienced dehydration.  Post-discharge, she experiences significant fatigue, shortness of breath with exertion, and occasional dull chest aches, particularly with movement. She uses an inhaler at night to aid with lung expansion.   No other complaints at time.    Past Medical History:  Diagnosis Date   Allergic rhinitis    Asthma, mild intermittent    followed by pcp    (prevously followed by dr Jerelene Monday w/ asthma/ allergy center)   Complication of anesthesia    difficulty waking up   Depression    GAD (generalized anxiety disorder)    GERD (gastroesophageal reflux disease)    Hiatal hernia    History of cardiomyopathy in adulthood 12/06/2021   in setting elevated troponin's w/ NSTEMI;  per echo  12-07-2021  ef 50-55%   History of heart murmur in childhood    since birth very slight   History of kidney stones    History of non-ST elevation myocardial infarction (NSTEMI) 12/06/2021   admission in epic---  elevated troponin's w/ NSTEMI  and stress cardiomyopathy,  12-07-2021 per cath normal  coronaries ;  cardiology hospital follow-up with Marlana Silvan NP 12-26-2021   Hyperlipidemia, mixed    Hypertension    IDA (iron deficiency anemia)    OA (osteoarthritis)    thumb and fingers, knee, right hip   OSA (obstructive sleep apnea)    12-27-2021  per pt OSA yrs ago last used cpap approx. 2013,  stated retested this year and is waiting on cpap machine to arrive   Renal disorder    Type 2 diabetes mellitus (HCC) 2008   followed by pcp   (12-27-2021  pt sated checks blood sugar am daily fasting, average 155-170)   Wears glasses     Past Surgical History:  Procedure Laterality Date   BREAST EXCISIONAL BIOPSY Right    x2  last one 1980s , benign   CARPAL TUNNEL RELEASE Right 05/22/2017   Procedure: RIGHT CARPAL TUNNEL RELEASE;  Surgeon: Lyanne Sample, MD;  Location: [facility];  Service: Orthopedics;  Laterality: Right;   CARPAL TUNNEL RELEASE Left 03/12/2018   Procedure: LEFT CARPAL TUNNEL RELEASE;  Surgeon: Lyanne Sample, MD;  Location: [facility];  Service: Orthopedics;  Laterality: Left;   CYSTOSCOPY W/ URETERAL STENT PLACEMENT Bilateral 12/11/2021   Procedure: CYSTOSCOPY , BILATERAL RETROGRADE , BILATERAL STENT PLACEMENT;  Surgeon: Osborn Blaze, MD;  Location: [facility] OR;  Service: Urology;  Laterality: Bilateral;   CYSTOSCOPY WITH RETROGRADE PYELOGRAM, URETEROSCOPY AND STENT PLACEMENT Bilateral 01/02/2022   Procedure: CYSTOSCOPY WITH  RETROGRADE PYELOGRAM, URETEROSCOPY AND STENT REPLACEMENT;  Surgeon: Osborn Blaze, MD;  Location: [facility];  Service: Urology;  Laterality: Bilateral;   DIAGNOSTIC LAPAROSCOPY     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   DILATION AND CURETTAGE OF UTERUS     yrs ago for missed ab   EXTRACORPOREAL SHOCK WAVE LITHOTRIPSY  03/2017   HOLMIUM LASER APPLICATION Bilateral 01/02/2022   Procedure: HOLMIUM LASER APPLICATION;  Surgeon: Osborn Blaze, MD;  Location: [facility];  Service: Urology;  Laterality:  Bilateral;   IR CATHETER TUBE CHANGE  07/10/2023   LAPAROSCOPIC APPENDECTOMY  2008   LEFT HEART CATH AND CORONARY ANGIOGRAPHY N/A 12/07/2021   Procedure: LEFT HEART CATH AND CORONARY ANGIOGRAPHY;  Surgeon: Arty Binning, MD;  Location: [facility];  Service: Cardiovascular;  Laterality: N/A;   LIPOMA EXCISION  2003   right shoulder   SCLERAL BUCKLE  10/15/2011   Procedure: SCLERAL BUCKLE;  Surgeon: Rexene Catching, MD;  Location: [facility] OR;  Service: Ophthalmology;  Laterality: Right;   SHOULDER ARTHROSCOPY WITH ROTATOR CUFF REPAIR Right 03/25/2019   Procedure: Right shoulder arthroscopy, subacromial decompression, distal clavicle resection, rotator cuff repair;  Surgeon: Ellard Gunning, MD;  Location: [facility];  Service: Orthopedics;  Laterality: Right;    WISDOM TOOTH EXTRACTION      Current Medications: Current Meds  Medication Sig   acetaminophen  (TYLENOL ) 500 MG tablet Take 500 mg by mouth every 6 (six) hours as needed for mild pain.   albuterol  (PROAIR  HFA) 108 (90 Base) MCG/ACT inhaler Can inhale two puffs every four to six hours as needed for cough or wheeze.   amLODipine  (NORVASC ) 5 MG tablet Take 1 tablet (5 mg total) by mouth daily. (Patient taking differently: Take 5 mg by mouth daily.)   atorvastatin  (LIPITOR) 40 MG tablet Take 40 mg by mouth at bedtime.   b complex vitamins tablet Take 1 tablet by mouth at bedtime.   cetirizine (ZYRTEC) 10 MG tablet Take 10 mg by mouth daily.   citalopram  (CELEXA ) 40 MG tablet Take 20 mg by mouth daily.   FARXIGA  10 MG TABS tablet Take 10 mg by mouth daily.   fenofibrate  (TRICOR ) 145 MG tablet Take 145 mg by mouth at bedtime.   fluticasone -salmeterol (ADVAIR HFA) 115-21 MCG/ACT inhaler Inhale 2 puffs into the lungs 2 (two) times daily.   glimepiride  (AMARYL ) 1 MG tablet Take 0.5 mg by mouth 2 (two) times daily.   indapamide (LOZOL) 1.25 MG tablet Take 1.25 mg by mouth daily.   Magnesium  200 MG TABS Take 1 tablet (200 mg total) by  mouth 2 (two) times daily.   montelukast  (SINGULAIR ) 10 MG tablet TAKE 1 TABLET BY MOUTH EVERY DAY (Patient taking differently: Take 10 mg by mouth at bedtime.)   nystatin cream (MYCOSTATIN) Apply 1 Application topically as needed for dry skin (itching, burning, redness).   omeprazole  (PRILOSEC) 40 MG capsule TAKE 1 CAPSULE BY MOUTH EVERY DAY IN THE MORNING BEFORE BREAKFAST (Patient taking differently: Take 40 mg by mouth daily.)   Semaglutide, 1 MG/DOSE, 2 MG/1.5ML SOPN Inject 2 mg into the skin once a week. Wednesday's     Allergies:   Amoxicillin, Aspirin , Bee venom, Clarithromycin, Erythromycin, Ibuprofen, Oxycodone , Sulfa antibiotics, Latex, and Tape   Social History   Socioeconomic History   Marital status: Married    Spouse name: Not on file   Number of children: Not on file   Years of education: Not on file   Highest  education level: Not on file  Occupational History   Not on file  Tobacco Use   Smoking status: Never   Smokeless tobacco: Never  Vaping Use   Vaping status: Never Used  Substance and Sexual Activity   Alcohol  use: No   Drug use: Never   Sexual activity: Not on file  Other Topics Concern   Not on file  Social History Narrative   ** Merged History Encounter **       Social Drivers of Health   Financial Resource Strain: Not on file  Food Insecurity: Low Risk  (07/24/2023)   Received from [facility]   Hunger Vital Sign    Worried About Running Out of Food in the Last Year: Never true    Ran Out of Food in the Last Year: Never true  Recent Concern: Food Insecurity - Food Insecurity Present (06/30/2023)   Hunger Vital Sign    Worried About Running Out of Food in the Last Year: Sometimes true    Ran Out of Food in the Last Year: Sometimes true  Transportation Needs: No Transportation Needs (07/24/2023)   Received from [facility]    In the past 12 months, has lack of reliable transportation kept you from medical appointments,  meetings, work or from getting things needed for daily living? : No  Physical Activity: Not on file  Stress: Not on file  Social Connections: Socially Integrated (06/30/2023)   Social Connection and Isolation Panel [NHANES]    Frequency of Communication with Friends and Family: More than three times a week    Frequency of Social Gatherings with Friends and Family: Once a week    Attends Religious Services: More than 4 times per year    Active Member of [employer] or Organizations: Yes    Attends Engineer, structural: More than 4 times per year    Marital Status: Married     Family History: The patient's family history includes Colon cancer in her paternal grandmother and paternal uncle; Unexplained death (age of onset: 67) in her sister. There is no history of Allergic rhinitis, Angioedema, Asthma, Atopy, Eczema, Immunodeficiency, Urticaria, Colon polyps, Esophageal cancer, Stomach cancer, Rectal cancer, or Breast cancer.  ROS:   Review of Systems  Constitution: Negative for decreased appetite, fever and weight gain.  HENT: Negative for congestion, ear discharge, hoarse voice and sore throat.   Eyes: Negative for discharge, redness, vision loss in right eye and visual halos.  Cardiovascular: Negative for chest pain, dyspnea on exertion, leg swelling, orthopnea and palpitations.  Respiratory: Negative for cough, hemoptysis, shortness of breath and snoring.   Endocrine: Negative for heat intolerance and polyphagia.  Hematologic/Lymphatic: Negative for bleeding problem. Does not bruise/bleed easily.  Skin: Negative for flushing, nail changes, rash and suspicious lesions.  Musculoskeletal: Negative for arthritis, joint pain, muscle cramps, myalgias, neck pain and stiffness.  Gastrointestinal: Negative for abdominal pain, bowel incontinence, diarrhea and excessive appetite.  Genitourinary: Negative for decreased libido, genital sores and incomplete emptying.  Neurological: Negative for  brief paralysis, focal weakness, headaches and loss of balance.  Psychiatric/Behavioral: Negative for altered mental status, depression and suicidal ideas.  Allergic/Immunologic: Negative for HIV exposure and persistent infections.    EKGs/Labs/Other Studies Reviewed:    The following studies were reviewed today:   EKG:  The ekg ordered today demonstrates   Recent Labs: 07/08/2023: B Natriuretic Peptide 51.2 07/12/2023: Hemoglobin 9.8; Platelets 461 08/12/2023: ALT 23; BUN 17; Creatinine, Ser 0.85; Magnesium  1.8; Potassium  4.4; Sodium 140  Recent Lipid Panel    Component Value Date/Time   CHOL 125 12/07/2021 0510   TRIG 138 12/07/2021 0510   HDL 38 (L) 12/07/2021 0510   CHOLHDL 3.3 12/07/2021 0510   VLDL 28 12/07/2021 0510   LDLCALC 59 12/07/2021 0510    Physical Exam:    VS:  BP 116/70 (BP Location: Right Arm, Patient Position: Sitting, Cuff Size: Large)   Pulse 72   Ht 5\' 2"  (1.575 m)   Wt 183 lb (83 kg)   SpO2 97%   BMI 33.47 kg/m     Wt Readings from Last 3 Encounters:  08/12/23 183 lb (83 kg)  06/30/23 200 lb (90.7 kg)  06/28/23 197 lb 15.6 oz (89.8 kg)     GEN: Well nourished, well developed in no acute distress HEENT: Normal NECK: No JVD; No carotid bruits LYMPHATICS: No lymphadenopathy CARDIAC: S1S2 noted,RRR, no murmurs, rubs, gallops RESPIRATORY:  Clear to auscultation without rales, wheezing or rhonchi  ABDOMEN: Soft, non-tender, non-distended, +bowel sounds, no guarding. EXTREMITIES: No edema, No cyanosis, no clubbing MUSCULOSKELETAL:  No deformity  SKIN: Warm and dry NEUROLOGIC:  Alert and oriented x 3, non-focal PSYCHIATRIC:  Normal affect, good insight  ASSESSMENT:    1. Medication management   2. Symptomatic PVCs   3. Primary hypertension   4. Diabetes mellitus type 2, noninsulin dependent (HCC)   5. OSA (obstructive sleep apnea)   6. Morbid obesity (HCC)    PLAN:     Musculoskeletal pain post chest tube -  Her pain is reproducible ,  musculoskeletal. Pain attributed to chest tube placement, described as dull aches and occasional sharp pains due to nerve compression. Pain has improved but persists with certain movements. - Consider over-the-counter lidocaine  patch for pain management if needed. - Adjust activity as tolerated.  Diabetes Mellitus - Hyperglycemia noted during hospitalization, likely due to stress and treatment regimen. Currently not on insulin . - Re-evaluate glucose levels in June.  Blood pressure is at target.   OSA -cont her CPAP  No symptoms from PVC,   The patient is in agreement with the above plan. The patient left the office in stable condition.  The patient will follow up in   Medication Adjustments/Labs and Tests Ordered: Current medicines are reviewed at length with the patient today.  Concerns regarding medicines are outlined above.  Orders Placed This Encounter  Procedures   Comp Met (CMET)   Magnesium    No orders of the defined types were placed in this encounter.   Patient Instructions  Medication Instructions:  Your physician recommends that you continue on your current medications as directed. Please refer to the Current Medication list given to you today.  *If you need a refill on your cardiac medications before your next appointment, please call your pharmacy*  Lab Work: CMET, Mag If you have labs (blood work) drawn today and your tests are completely normal, you will receive your results only by: MyChart Message (if you have MyChart) OR A paper copy in the mail If you have any lab test that is abnormal or we need to change your treatment, we will call you to review the results.    Follow-Up: At [facility], you and your health needs are our priority.  As part of our continuing mission to provide you with exceptional heart care, our providers are all part of one team.  This team includes your primary Cardiologist (physician) and Advanced Practice Providers or APPs  (Physician Assistants  and Nurse Practitioners) who all work together to provide you with the care you need, when you need it.  Your next appointment:   6 month(s)  Provider:   Michole Lecuyer, DO        Adopting a Healthy Lifestyle.  Know what a healthy weight is for you (roughly BMI <25) and aim to maintain this   Aim for 7+ servings of fruits and vegetables daily   65-80+ fluid ounces of water  or unsweet tea for healthy kidneys   Limit to max 1 drink of alcohol  per day; avoid smoking/tobacco   Limit animal fats in diet for cholesterol and heart health - choose grass fed whenever available   Avoid highly processed foods, and foods high in saturated/trans fats   Aim for low stress - take time to unwind and care for your mental health   Aim for 150 min of moderate intensity exercise weekly for heart health, and weights twice weekly for bone health   Aim for 7-9 hours of sleep daily   When it comes to diets, agreement about the perfect plan isnt easy to find, even among the experts. Experts at the [facility] of [employer] developed an idea known as the Healthy Eating Plate. Just imagine a plate divided into logical, healthy portions.   The emphasis is on diet quality:   Load up on vegetables and fruits - one-half of your plate: Aim for color and variety, and remember that potatoes dont count.   Go for whole grains - one-quarter of your plate: Whole wheat, barley, wheat berries, quinoa, oats, brown rice, and foods made with them. If you want pasta, go with whole wheat pasta.   Protein power - one-quarter of your plate: Fish, chicken, beans, and nuts are all healthy, versatile protein sources. Limit red meat.   The diet, however, does go beyond the plate, offering a few other suggestions.   Use healthy plant oils, such as olive, canola, soy, corn, sunflower and peanut. Check the labels, and avoid partially hydrogenated oil, which have unhealthy trans fats.   If youre  thirsty, drink water . Coffee and tea are good in moderation, but skip sugary drinks and limit milk and dairy products to one or two daily servings.   The type of carbohydrate in the diet is more important than the amount. Some sources of carbohydrates, such as vegetables, fruits, whole grains, and beans-are healthier than others.   Finally, stay active  Signed, Shylee Durrett, DO  08/15/2023 8:52 PM    [facility]

## 2023-08-12 NOTE — Patient Instructions (Addendum)
 Medication Instructions:  Your physician recommends that you continue on your current medications as directed. Please refer to the Current Medication list given to you today.  *If you need a refill on your cardiac medications before your next appointment, please call your pharmacy*  Lab Work: CMET, Mag If you have labs (blood work) drawn today and your tests are completely normal, you will receive your results only by: MyChart Message (if you have MyChart) OR A paper copy in the mail If you have any lab test that is abnormal or we need to change your treatment, we will call you to review the results.    Follow-Up: At [facility], you and your health needs are our priority.  As part of our continuing mission to provide you with exceptional heart care, our providers are all part of one team.  This team includes your primary Cardiologist (physician) and Advanced Practice Providers or APPs (Physician Assistants and Nurse Practitioners) who all work together to provide you with the care you need, when you need it.  Your next appointment:   6 month(s)  Provider:   Kardie Tobb, DO

## 2023-08-13 LAB — COMPREHENSIVE METABOLIC PANEL WITH GFR
ALT: 23 IU/L (ref 0–32)
AST: 22 IU/L (ref 0–40)
Albumin: 4.2 g/dL (ref 3.9–4.9)
Alkaline Phosphatase: 54 IU/L (ref 44–121)
BUN/Creatinine Ratio: 20 (ref 12–28)
BUN: 17 mg/dL (ref 8–27)
Bilirubin Total: 0.2 mg/dL (ref 0.0–1.2)
CO2: 23 mmol/L (ref 20–29)
Calcium: 9.8 mg/dL (ref 8.7–10.3)
Chloride: 101 mmol/L (ref 96–106)
Creatinine, Ser: 0.85 mg/dL (ref 0.57–1.00)
Globulin, Total: 3 g/dL (ref 1.5–4.5)
Glucose: 154 mg/dL — ABNORMAL HIGH (ref 70–99)
Potassium: 4.4 mmol/L (ref 3.5–5.2)
Sodium: 140 mmol/L (ref 134–144)
Total Protein: 7.2 g/dL (ref 6.0–8.5)
eGFR: 74 mL/min/{1.73_m2} (ref >59)

## 2023-08-13 LAB — MAGNESIUM: Magnesium: 1.8 mg/dL (ref 1.6–2.3)

## 2023-08-18 ENCOUNTER — Ambulatory Visit (INDEPENDENT_AMBULATORY_CARE_PROVIDER_SITE_OTHER)

## 2023-08-18 ENCOUNTER — Ambulatory Visit: Admitting: Pulmonary Disease

## 2023-08-18 ENCOUNTER — Other Ambulatory Visit: Payer: Self-pay | Admitting: Pulmonary Disease

## 2023-08-18 ENCOUNTER — Encounter: Payer: Self-pay | Admitting: Pulmonary Disease

## 2023-08-18 VITALS — BP 126/76 | HR 69 | Ht 62.0 in | Wt 183.0 lb

## 2023-08-18 DIAGNOSIS — J929 Pleural plaque without asbestos: Secondary | ICD-10-CM | POA: Diagnosis not present

## 2023-08-18 DIAGNOSIS — J189 Pneumonia, unspecified organism: Secondary | ICD-10-CM

## 2023-08-18 DIAGNOSIS — E119 Type 2 diabetes mellitus without complications: Secondary | ICD-10-CM | POA: Diagnosis not present

## 2023-08-18 DIAGNOSIS — J45909 Unspecified asthma, uncomplicated: Secondary | ICD-10-CM | POA: Diagnosis not present

## 2023-08-18 DIAGNOSIS — G4730 Sleep apnea, unspecified: Secondary | ICD-10-CM | POA: Diagnosis not present

## 2023-08-18 DIAGNOSIS — J15212 Pneumonia due to Methicillin resistant Staphylococcus aureus: Secondary | ICD-10-CM | POA: Diagnosis not present

## 2023-08-18 DIAGNOSIS — I700 Atherosclerosis of aorta: Secondary | ICD-10-CM | POA: Diagnosis not present

## 2023-08-18 DIAGNOSIS — J90 Pleural effusion, not elsewhere classified: Secondary | ICD-10-CM | POA: Diagnosis not present

## 2023-08-18 DIAGNOSIS — R918 Other nonspecific abnormal finding of lung field: Secondary | ICD-10-CM | POA: Diagnosis not present

## 2023-08-18 NOTE — Patient Instructions (Signed)
 VISIT SUMMARY:  Today, we discussed your recovery from MRSA pneumonia and empyema, your asthma management, sleep apnea, and diabetes control. You are feeling better but not fully recovered from the pneumonia. We also reviewed your diabetes management and encouraged lifestyle changes to improve your glucose levels.  YOUR PLAN:  -MRSA PNEUMONIA WITH EMPYEMA: MRSA pneumonia is a severe lung infection caused by a type of bacteria called MRSA, and empyema is a collection of pus in the space around the lungs. You were treated in the hospital and are now feeling better, but not fully recovered. Your recent x-ray shows improvement, but there may be some residual scarring. We will repeat your chest x-ray in three months to check on your progress.  -ASTHMA: Asthma is a condition where your airways narrow and swell, making it hard to breathe. Your asthma is mild and usually triggered by allergies or respiratory infections. You have been using an albuterol  inhaler as needed and were given a purple inhaler during your hospital stay. Continue to use your inhalers as prescribed.  -SLEEP APNEA: Sleep apnea is a condition where your breathing stops and starts during sleep. You use a CPAP machine to help with this, but you stopped using it during your illness. Please resume using your CPAP machine to help manage your sleep apnea.  -TYPE 2 DIABETES MELLITUS, UNCONTROLLED: Type 2 diabetes is a condition where your body does not use insulin  properly, leading to high blood sugar levels. Your diabetes was previously uncontrolled, but you have made improvements. You are currently taking Ozempic at 2 mg. Continue with this medication and make sure to exercise and follow dietary recommendations to help control your blood sugar levels.  INSTRUCTIONS:  Please schedule a repeat chest x-ray in three months to assess the resolution of your pneumonia and empyema. Continue using your CPAP machine for sleep apnea. Maintain your  current dose of Ozempic at 2 mg and focus on exercise and dietary modifications to improve your glucose control.

## 2023-08-18 NOTE — Progress Notes (Signed)
 Jade Perez    [id number]    1955/12/06  Primary Care Physician:Hodges, Jerlene Moody, MD  Referring Physician: Lonie Roa, MD [address], [address] [place],  [place] [address]  Chief complaint: Follow-up for pneumonia, effusion  HPI: 68 y.o. who  has a past medical history of Allergic rhinitis, Asthma, mild intermittent, Complication of anesthesia, Depression, GAD (generalized anxiety disorder), GERD (gastroesophageal reflux disease), Hiatal hernia, History of cardiomyopathy in adulthood (12/06/2021), History of heart murmur in childhood, History of kidney stones, History of non-ST elevation myocardial infarction (NSTEMI) (12/06/2021), Hyperlipidemia, mixed, Hypertension, IDA (iron deficiency anemia), OA (osteoarthritis), OSA (obstructive sleep apnea), Renal disorder, Type 2 diabetes mellitus (HCC) (2008), and Wears glasses.   Discussed the use of AI scribe software for clinical note transcription with the patient, who gave verbal consent to proceed.  History of Present Illness Jade Perez "Jade Perez" is a 68 year old female with MRSA pneumonia and diabetes who presents for follow-up of her pulmonary condition. She is accompanied by her husband, Debborah Fairly.  She was hospitalized for MRSA pneumonia and empyema, requiring chest tube placement and intrapleural therapy. Discharged on April 5th after a 13-day hospital stay, she was prescribed linezolid  for five additional days. She feels better but not fully recovered, experiencing reduced stamina and needing frequent rest. A recent x-ray shows improvement, though not completely back to normal, with potential residual scarring.  Her diabetes was not well-controlled prior to her pneumonia diagnosis, with an HbA1c of 8.2. She has since managed to lower it to the 7 range. She is currently on Ozempic, having recently increased her dose back to 2 mg after hospitalization adjustments.  She has a history of mild asthma, typically triggered  by allergies or respiratory infections. She uses an albuterol  inhaler as needed and was given a purple inhaler during her hospital stay, which she used until recently. She is also on Singulair  for her allergies.  She has sleep apnea and uses a CPAP machine, although she stopped using it during her illness due to breathing difficulties and claustrophobia. She intends to resume its use.   Pets: Cat, dog Occupation: Retired Education officer, museum Exposures: No mold, hot tub, Jacuzzi.  No feather pillows or comforters No h/o chemo/XRT/amiodarone/macrodantin /MTX  No exposure to asbestos, silica or other organic allergens  Smoking history: Never smoker Travel history: No significant travel history Relevant family history:Her family history includes her father having chronic bronchitis and asthma later in life, and her mother dying of tuberculosis in the 59s.    Outpatient Encounter Medications as of 08/18/2023  Medication Sig   acetaminophen  (TYLENOL ) 500 MG tablet Take 500 mg by mouth every 6 (six) hours as needed for mild pain.   albuterol  (PROAIR  HFA) 108 (90 Base) MCG/ACT inhaler Can inhale two puffs every four to six hours as needed for cough or wheeze.   amLODipine  (NORVASC ) 5 MG tablet Take 1 tablet (5 mg total) by mouth daily. (Patient taking differently: Take 5 mg by mouth daily.)   atorvastatin  (LIPITOR) 40 MG tablet Take 40 mg by mouth at bedtime.   b complex vitamins tablet Take 1 tablet by mouth at bedtime.   cetirizine (ZYRTEC) 10 MG tablet Take 10 mg by mouth daily.   citalopram  (CELEXA ) 40 MG tablet Take 20 mg by mouth daily.   FARXIGA  10 MG TABS tablet Take 10 mg by mouth daily.   fenofibrate  (TRICOR ) 145 MG tablet Take 145 mg by mouth at bedtime.  fluticasone -salmeterol (ADVAIR HFA) 115-21 MCG/ACT inhaler Inhale 2 puffs into the lungs 2 (two) times daily.   glimepiride  (AMARYL ) 1 MG tablet Take 0.5 mg by mouth 2 (two) times daily.   guaiFENesin  (ROBITUSSIN) 100 MG/5ML liquid  Take 10 mLs by mouth every 6 (six) hours.   indapamide (LOZOL) 1.25 MG tablet Take 1.25 mg by mouth daily.   Magnesium  200 MG TABS Take 1 tablet (200 mg total) by mouth 2 (two) times daily.   montelukast  (SINGULAIR ) 10 MG tablet TAKE 1 TABLET BY MOUTH EVERY DAY (Patient taking differently: Take 10 mg by mouth at bedtime.)   nystatin cream (MYCOSTATIN) Apply 1 Application topically as needed for dry skin (itching, burning, redness).   omeprazole  (PRILOSEC) 40 MG capsule TAKE 1 CAPSULE BY MOUTH EVERY DAY IN THE MORNING BEFORE BREAKFAST (Patient taking differently: Take 40 mg by mouth daily.)   Semaglutide, 1 MG/DOSE, 2 MG/1.5ML SOPN Inject 2 mg into the skin once a week. Wednesday's   cyclobenzaprine  (FLEXERIL ) 5 MG tablet Take 1 tablet (5 mg total) by mouth 3 (three) times daily as needed for muscle spasms. (Patient not taking: Reported on 08/18/2023)   Ferrous Sulfate  (SLOW FE PO) Take 1 tablet by mouth daily. (Patient not taking: Reported on 08/18/2023)   polyethylene glycol (MIRALAX  / GLYCOLAX ) 17 g packet Take 17 g by mouth daily as needed for mild constipation. (Patient not taking: Reported on 08/18/2023)   No facility-administered encounter medications on file as of 08/18/2023.    Allergies as of 08/18/2023 - Review Complete 08/18/2023  Allergen Reaction Noted   Amoxicillin Hives 10/11/2011   Aspirin  Swelling 10/11/2011   Bee venom Hives and Other (See Comments) 08/20/2019   Clarithromycin Nausea And Vomiting 05/15/2017   Erythromycin Nausea Only 08/20/2019   Ibuprofen Swelling 12/06/2021   Oxycodone  Itching 12/17/2021   Sulfa antibiotics Hives 10/11/2011   Latex Rash 10/14/2011   Tape Rash and Other (See Comments) 10/14/2011    Past Medical History:  Diagnosis Date   Allergic rhinitis    Asthma, mild intermittent    followed by pcp    (prevously followed by dr Jerelene Monday w/ asthma/ allergy center)   Complication of anesthesia    difficulty waking up   Depression    GAD (generalized  anxiety disorder)    GERD (gastroesophageal reflux disease)    Hiatal hernia    History of cardiomyopathy in adulthood 12/06/2021   in setting elevated troponin's w/ NSTEMI;  per echo  12-07-2021  ef 50-55%   History of heart murmur in childhood    since birth very slight   History of kidney stones    History of non-ST elevation myocardial infarction (NSTEMI) 12/06/2021   admission in epic---  elevated troponin's w/ NSTEMI  and stress cardiomyopathy,  12-07-2021 per cath normal coronaries ;  cardiology hospital follow-up with Marlana Silvan NP 12-26-2021   Hyperlipidemia, mixed    Hypertension    IDA (iron deficiency anemia)    OA (osteoarthritis)    thumb and fingers, knee, right hip   OSA (obstructive sleep apnea)    12-27-2021  per pt OSA yrs ago last used cpap approx. 2013,  stated retested this year and is waiting on cpap machine to arrive   Renal disorder    Type 2 diabetes mellitus (HCC) 2008   followed by pcp   (12-27-2021  pt sated checks blood sugar am daily fasting, average 155-170)   Wears glasses     Past Surgical History:  Procedure Laterality Date  BREAST EXCISIONAL BIOPSY Right    x2  last one 1980s , benign   CARPAL TUNNEL RELEASE Right 05/22/2017   Procedure: RIGHT CARPAL TUNNEL RELEASE;  Surgeon: Lyanne Sample, MD;  Location: [facility];  Service: Orthopedics;  Laterality: Right;   CARPAL TUNNEL RELEASE Left 03/12/2018   Procedure: LEFT CARPAL TUNNEL RELEASE;  Surgeon: Lyanne Sample, MD;  Location: [facility];  Service: Orthopedics;  Laterality: Left;   CYSTOSCOPY W/ URETERAL STENT PLACEMENT Bilateral 12/11/2021   Procedure: CYSTOSCOPY , BILATERAL RETROGRADE , BILATERAL STENT PLACEMENT;  Surgeon: Osborn Blaze, MD;  Location: [facility];  Service: Urology;  Laterality: Bilateral;   CYSTOSCOPY WITH RETROGRADE PYELOGRAM, URETEROSCOPY AND STENT PLACEMENT Bilateral 01/02/2022   Procedure: CYSTOSCOPY WITH RETROGRADE PYELOGRAM, URETEROSCOPY AND STENT  REPLACEMENT;  Surgeon: Osborn Blaze, MD;  Location: [facility];  Service: Urology;  Laterality: Bilateral;   DIAGNOSTIC LAPAROSCOPY     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   DILATION AND CURETTAGE OF UTERUS     yrs ago for missed ab   EXTRACORPOREAL SHOCK WAVE LITHOTRIPSY  03/2017   HOLMIUM LASER APPLICATION Bilateral 01/02/2022   Procedure: HOLMIUM LASER APPLICATION;  Surgeon: Osborn Blaze, MD;  Location: [facility];  Service: Urology;  Laterality: Bilateral;   IR CATHETER TUBE CHANGE  07/10/2023   LAPAROSCOPIC APPENDECTOMY  2008   LEFT HEART CATH AND CORONARY ANGIOGRAPHY N/A 12/07/2021   Procedure: LEFT HEART CATH AND CORONARY ANGIOGRAPHY;  Surgeon: Arty Binning, MD;  Location: [facility];  Service: Cardiovascular;  Laterality: N/A;   LIPOMA EXCISION  2003   right shoulder   SCLERAL BUCKLE  10/15/2011   Procedure: SCLERAL BUCKLE;  Surgeon: Rexene Catching, MD;  Location: [facility] OR;  Service: Ophthalmology;  Laterality: Right;   SHOULDER ARTHROSCOPY WITH ROTATOR CUFF REPAIR Right 03/25/2019   Procedure: Right shoulder arthroscopy, subacromial decompression, distal clavicle resection, rotator cuff repair;  Surgeon: Ellard Gunning, MD;  Location: [facility];  Service: Orthopedics;  Laterality: Right;    WISDOM TOOTH EXTRACTION      Family History  Problem Relation Age of Onset   Unexplained death Sister 18       died in her sleep   Colon cancer Paternal Grandmother    Colon cancer Paternal Uncle    Allergic rhinitis Neg Hx    Angioedema Neg Hx    Asthma Neg Hx    Atopy Neg Hx    Eczema Neg Hx    Immunodeficiency Neg Hx    Urticaria Neg Hx    Colon polyps Neg Hx    Esophageal cancer Neg Hx    Stomach cancer Neg Hx    Rectal cancer Neg Hx    Breast cancer Neg Hx     Social History   Socioeconomic History   Marital status: Married    Spouse name: Not on file   Number of children: Not on file   Years of education: Not on file   Highest  education level: Not on file  Occupational History   Not on file  Tobacco Use   Smoking status: Never   Smokeless tobacco: Never  Vaping Use   Vaping status: Never Used  Substance and Sexual Activity   Alcohol  use: No   Drug use: Never   Sexual activity: Not on file  Other Topics Concern   Not on file  Social History Narrative   ** Merged History Encounter **       Social Drivers  of Health   Financial Resource Strain: Not on file  Food Insecurity: Low Risk  (07/24/2023)   Received from [facility]   Hunger Vital Sign    Worried About Running Out of Food in the Last Year: Never true    Ran Out of Food in the Last Year: Never true  Recent Concern: Food Insecurity - Food Insecurity Present (06/30/2023)   Hunger Vital Sign    Worried About Running Out of Food in the Last Year: Sometimes true    Ran Out of Food in the Last Year: Sometimes true  Transportation Needs: No Transportation Needs (07/24/2023)   Received from [facility]    In the past 12 months, has lack of reliable transportation kept you from medical appointments, meetings, work or from getting things needed for daily living? : No  Physical Activity: Not on file  Stress: Not on file  Social Connections: Socially Integrated (06/30/2023)   Social Connection and Isolation Panel [NHANES]    Frequency of Communication with Friends and Family: More than three times a week    Frequency of Social Gatherings with Friends and Family: Once a week    Attends Religious Services: More than 4 times per year    Active Member of [employer] or Organizations: Yes    Attends Engineer, structural: More than 4 times per year    Marital Status: Married  Catering manager Violence: Not At Risk (06/30/2023)   Humiliation, Afraid, Rape, and Kick questionnaire    Fear of Current or Ex-Partner: No    Emotionally Abused: No    Physically Abused: No    Sexually Abused: No    Review of systems: Review of Systems   Constitutional: Negative for fever and chills.  HENT: Negative.   Eyes: Negative for blurred vision.  Respiratory: as per HPI  Cardiovascular: Negative for chest pain and palpitations.  Gastrointestinal: Negative for vomiting, diarrhea, blood per rectum. Genitourinary: Negative for dysuria, urgency, frequency and hematuria.  Musculoskeletal: Negative for myalgias, back pain and joint pain.  Skin: Negative for itching and rash.  Neurological: Negative for dizziness, tremors, focal weakness, seizures and loss of consciousness.  Endo/Heme/Allergies: Negative for environmental allergies.  Psychiatric/Behavioral: Negative for depression, suicidal ideas and hallucinations.  All other systems reviewed and are negative.  Physical Exam: Blood pressure 126/76, pulse 69, height 5\' 2"  (1.575 m), weight 183 lb (83 kg), SpO2 97%. Gen:      No acute distress HEENT:  EOMI, sclera anicteric Neck:     No masses; no thyromegaly Lungs:    Clear to auscultation bilaterally; normal respiratory effort CV:         Regular rate and rhythm; no murmurs Abd:      + bowel sounds; soft, non-tender; no palpable masses, no distension Ext:    No edema; adequate peripheral perfusion Skin:      Warm and dry; no rash Neuro: alert and oriented x 3 Psych: normal mood and affect  Data Reviewed: Imaging: CT chest 07/04/2023-enlargement and left effusion with loculation, atelectasis/consolidation in the left lower lobe CT chest 07/08/2023- Loculated left effusion, consolidation in the left lower lobe, chest tube in place Chest x-ray 08/18/2023-preliminary read.  Improvement in left effusion with some residual consolidation  I have reviewed the images personally.   PFTs:  Labs:  Assessment & Plan MRSA pneumonia with empyema Hospitalized for MRSA pneumonia with associated empyema. Underwent chest tube placement and intrapleural therapy. Treated with linezolid  for five days. Currently feeling  better but not fully  recovered. X-ray shows improvement but not complete resolution, with potential for residual scarring. Inflammation and consolidation may take weeks to months to resolve. Infection appears controlled and treated. - Repeat chest x-ray in three months to assess resolution of pneumonia and empyema.  Asthma Asthma is mild and typically triggered by allergies or respiratory infections. Currently not using inhaler regularly. Previously used albuterol  inhaler and was given Advair inhaler in the hospital.  Likely does not need to continue controller medication  Sleep apnea Has sleep apnea and uses a CPAP machine. Discontinued use due to illness and claustrophobia but plans to resume. - Resume use of CPAP machine.  Type 2 diabetes mellitus, uncontrolled Diabetes was previously uncontrolled with HbA1c reaching 8.2 but has been improving. Currently on Ozempic, recently increased to 2 mg. Glucose levels were elevated in recent labs. Exercise and dietary modifications are encouraged to improve glucose control.  Continue management by primary care  Recommendations: Follow-up in 3 months with chest x-ray  Phyllis Breeze MD [facility] 08/18/2023, 11:09 AM  CC: Lonie Roa, MD

## 2023-08-19 ENCOUNTER — Ambulatory Visit: Payer: Self-pay | Admitting: Cardiology

## 2023-09-06 DIAGNOSIS — E1169 Type 2 diabetes mellitus with other specified complication: Secondary | ICD-10-CM | POA: Diagnosis not present

## 2023-09-07 DIAGNOSIS — Z6835 Body mass index (BMI) 35.0-35.9, adult: Secondary | ICD-10-CM | POA: Diagnosis not present

## 2023-09-07 DIAGNOSIS — E1169 Type 2 diabetes mellitus with other specified complication: Secondary | ICD-10-CM | POA: Diagnosis not present

## 2023-09-11 DIAGNOSIS — L0100 Impetigo, unspecified: Secondary | ICD-10-CM | POA: Diagnosis not present

## 2023-09-18 DIAGNOSIS — M7752 Other enthesopathy of left foot: Secondary | ICD-10-CM | POA: Diagnosis not present

## 2023-10-06 DIAGNOSIS — E1169 Type 2 diabetes mellitus with other specified complication: Secondary | ICD-10-CM | POA: Diagnosis not present

## 2023-10-07 DIAGNOSIS — E1169 Type 2 diabetes mellitus with other specified complication: Secondary | ICD-10-CM | POA: Diagnosis not present

## 2023-10-07 DIAGNOSIS — Z6835 Body mass index (BMI) 35.0-35.9, adult: Secondary | ICD-10-CM | POA: Diagnosis not present

## 2023-10-13 DIAGNOSIS — E1169 Type 2 diabetes mellitus with other specified complication: Secondary | ICD-10-CM | POA: Diagnosis not present

## 2023-10-13 DIAGNOSIS — I129 Hypertensive chronic kidney disease with stage 1 through stage 4 chronic kidney disease, or unspecified chronic kidney disease: Secondary | ICD-10-CM | POA: Diagnosis not present

## 2023-10-20 DIAGNOSIS — N182 Chronic kidney disease, stage 2 (mild): Secondary | ICD-10-CM | POA: Diagnosis not present

## 2023-10-20 DIAGNOSIS — E785 Hyperlipidemia, unspecified: Secondary | ICD-10-CM | POA: Diagnosis not present

## 2023-10-20 DIAGNOSIS — E1169 Type 2 diabetes mellitus with other specified complication: Secondary | ICD-10-CM | POA: Diagnosis not present

## 2023-10-20 DIAGNOSIS — I129 Hypertensive chronic kidney disease with stage 1 through stage 4 chronic kidney disease, or unspecified chronic kidney disease: Secondary | ICD-10-CM | POA: Diagnosis not present

## 2023-10-20 DIAGNOSIS — F339 Major depressive disorder, recurrent, unspecified: Secondary | ICD-10-CM | POA: Diagnosis not present

## 2023-10-20 DIAGNOSIS — Z6835 Body mass index (BMI) 35.0-35.9, adult: Secondary | ICD-10-CM | POA: Diagnosis not present

## 2023-10-21 DIAGNOSIS — M7752 Other enthesopathy of left foot: Secondary | ICD-10-CM | POA: Diagnosis not present

## 2023-10-24 ENCOUNTER — Encounter (INDEPENDENT_AMBULATORY_CARE_PROVIDER_SITE_OTHER): Payer: Medicare PPO | Admitting: Ophthalmology

## 2023-10-24 DIAGNOSIS — I10 Essential (primary) hypertension: Secondary | ICD-10-CM

## 2023-10-24 DIAGNOSIS — H338 Other retinal detachments: Secondary | ICD-10-CM

## 2023-10-24 DIAGNOSIS — H43813 Vitreous degeneration, bilateral: Secondary | ICD-10-CM

## 2023-10-24 DIAGNOSIS — H35033 Hypertensive retinopathy, bilateral: Secondary | ICD-10-CM | POA: Diagnosis not present

## 2023-10-24 DIAGNOSIS — H33302 Unspecified retinal break, left eye: Secondary | ICD-10-CM

## 2023-10-24 DIAGNOSIS — H35341 Macular cyst, hole, or pseudohole, right eye: Secondary | ICD-10-CM

## 2023-10-27 DIAGNOSIS — F331 Major depressive disorder, recurrent, moderate: Secondary | ICD-10-CM | POA: Diagnosis not present

## 2023-11-06 DIAGNOSIS — E1169 Type 2 diabetes mellitus with other specified complication: Secondary | ICD-10-CM | POA: Diagnosis not present

## 2023-11-07 DIAGNOSIS — Z6835 Body mass index (BMI) 35.0-35.9, adult: Secondary | ICD-10-CM | POA: Diagnosis not present

## 2023-11-07 DIAGNOSIS — E1169 Type 2 diabetes mellitus with other specified complication: Secondary | ICD-10-CM | POA: Diagnosis not present

## 2023-11-12 DIAGNOSIS — F331 Major depressive disorder, recurrent, moderate: Secondary | ICD-10-CM | POA: Diagnosis not present

## 2023-11-17 ENCOUNTER — Other Ambulatory Visit: Payer: Self-pay | Admitting: Family Medicine

## 2023-11-17 DIAGNOSIS — Z1231 Encounter for screening mammogram for malignant neoplasm of breast: Secondary | ICD-10-CM

## 2023-11-18 ENCOUNTER — Ambulatory Visit: Admitting: Pulmonary Disease

## 2023-11-18 ENCOUNTER — Encounter: Payer: Self-pay | Admitting: Pulmonary Disease

## 2023-11-18 ENCOUNTER — Ambulatory Visit

## 2023-11-18 VITALS — BP 118/74 | HR 70 | Temp 98.5°F | Ht 62.0 in | Wt 195.2 lb

## 2023-11-18 DIAGNOSIS — J189 Pneumonia, unspecified organism: Secondary | ICD-10-CM | POA: Diagnosis not present

## 2023-11-18 NOTE — Patient Instructions (Signed)
  VISIT SUMMARY: You came in today for a follow-up on your pulmonary condition after having pneumonia and a chest tube placement in April. You have been feeling progressively better, though you still experience occasional chest tightness and muscular pain at the chest tube site. We also reviewed your asthma and sleep apnea management, and noted that your diabetes is being managed by your primary care physician.  YOUR PLAN: -PNEUMONIA WITH RESIDUAL INFLAMMATION, POST-CHEST TUBE PLACEMENT: You had pneumonia in April which required a chest tube placement. While you are improving, you still have some chest tightness and muscle pain at the chest tube site. This is likely due to residual inflammation and potential scar tissue. We will order a chest X-ray to check for any remaining inflammation or scar tissue. We will communicate the results to you via MyChart or phone call. Follow-up is only needed if the X-ray shows unexpected findings.  -ASTHMA: You have a history of asthma but rarely use your inhaler, only during winter colds. There have been no recent asthma flare-ups, which is good news.  -SLEEP-DISORDERED BREATHING: You have sleep apnea but have not resumed using your CPAP machine due to discomfort. It is important to address this with your primary care physician to find a comfortable solution.  -DIABETES MANAGEMENT: Your diabetes is being managed by your primary care physician with regular visits every three to four months. Continue following their recommendations.  INSTRUCTIONS: Please get a chest X-ray as ordered. We will inform you of the results via MyChart or phone call. Follow-up is only necessary if the X-ray shows unexpected findings.

## 2023-11-18 NOTE — Progress Notes (Signed)
 Jade Perez    [id number]    06-14-1955  Primary Care Physician:Hodges, Landry, MD  Referring Physician: Trinidad Landry, MD [address] [place],  [place] [address]  Chief complaint: Follow-up for pneumonia, effusion  HPI: 68 y.o. who  has a past medical history of Allergic rhinitis, Asthma, mild intermittent, Complication of anesthesia, Depression, GAD (generalized anxiety disorder), GERD (gastroesophageal reflux disease), Hiatal hernia, History of cardiomyopathy in adulthood (12/06/2021), History of heart murmur in childhood, History of kidney stones, History of non-ST elevation myocardial infarction (NSTEMI) (12/06/2021), Hyperlipidemia, mixed, Hypertension, IDA (iron deficiency anemia), OA (osteoarthritis), OSA (obstructive sleep apnea), Renal disorder, Type 2 diabetes mellitus (HCC) (2008), and Wears glasses.   Discussed the use of AI scribe software for clinical note transcription with the patient, who gave verbal consent to proceed.  History of Present Illness Jade Perez is a 67 year old female with MRSA pneumonia and diabetes who presents for follow-up of her pulmonary condition. She is accompanied by her husband, Dale.  She was hospitalized for MRSA pneumonia and empyema, requiring chest tube placement and intrapleural therapy. Discharged on April 5th after a 13-day hospital stay, she was prescribed linezolid  for five additional days. She feels better but not fully recovered, experiencing reduced stamina and needing frequent rest. A recent x-ray shows improvement, though not completely back to normal, with potential residual scarring.  Interval history:  Jade Perez is a 68 year old female with a history of pneumonia and chest tube placement who presents for follow-up of her pulmonary condition.  Pulmonary symptoms and post-pneumonia recovery - Recent pneumonia in April with chest tube placement - Progressive improvement  in overall condition, feeling more normal - Occasional chest tightness, especially with exertion - Avoids strenuous activities - Persistent muscular pain at previous chest tube insertion site - No redness or flare-up at chest tube site - After hospital discharge, primary care physician noted decreased air movement in lungs, expected to improve over time  Asthma symptoms - History of asthma - Rarely uses inhaler, only during winter colds - No recent asthma exacerbations  Sleep-disordered breathing - History of sleep apnea - Has not resumed CPAP therapy due to discomfort  Diabetes management - Diabetes managed by primary care physician with visits every three to four months  Relevant pulmonary history Pets: Cat, dog Occupation: Retired Education officer, 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  No feather pillows or comforters No h/o chemo/XRT/amiodarone/macrodantin /MTX  No exposure to asbestos, silica or other organic allergens  Smoking history: Never smoker Travel history: No significant travel history Relevant family history:Her family history includes her father having chronic bronchitis and asthma later in life, and her mother dying of tuberculosis in the 27s.    Outpatient Encounter Medications as of 11/18/2023  Medication Sig   acetaminophen  (TYLENOL ) 500 MG tablet Take 500 mg by mouth every 6 (six) hours as needed for mild pain.   albuterol  (PROAIR  HFA) 108 (90 Base) MCG/ACT inhaler Can inhale two puffs every four to six hours as needed for cough or wheeze.   amLODipine  (NORVASC ) 5 MG tablet Take 1 tablet (5 mg total) by mouth daily. (Patient taking differently: Take 5 mg by mouth daily.)   atorvastatin  (LIPITOR) 40 MG tablet Take 40 mg by mouth at bedtime.   b complex vitamins tablet Take 1 tablet by mouth at bedtime.   cetirizine (ZYRTEC) 10 MG tablet Take 10 mg by mouth daily.  citalopram  (CELEXA ) 40 MG tablet Take 20 mg by mouth daily.   FARXIGA  10 MG TABS tablet  Take 10 mg by mouth daily.   fenofibrate  (TRICOR ) 145 MG tablet Take 145 mg by mouth at bedtime.   glimepiride  (AMARYL ) 1 MG tablet Take 0.5 mg by mouth 2 (two) times daily.   indapamide (LOZOL) 1.25 MG tablet Take 1.25 mg by mouth daily.   montelukast  (SINGULAIR ) 10 MG tablet TAKE 1 TABLET BY MOUTH EVERY DAY (Patient taking differently: Take 10 mg by mouth at bedtime.)   nystatin cream (MYCOSTATIN) Apply 1 Application topically as needed for dry skin (itching, burning, redness).   omeprazole  (PRILOSEC) 40 MG capsule TAKE 1 CAPSULE BY MOUTH EVERY DAY IN THE MORNING BEFORE BREAKFAST (Patient taking differently: Take 40 mg by mouth daily.)   Semaglutide, 1 MG/DOSE, 2 MG/1.5ML SOPN Inject 2 mg into the skin once a week. Wednesday's   Ferrous Sulfate  (SLOW FE PO) Take 1 tablet by mouth daily. (Patient not taking: Reported on 11/18/2023)   [DISCONTINUED] cyclobenzaprine  (FLEXERIL ) 5 MG tablet Take 1 tablet (5 mg total) by mouth 3 (three) times daily as needed for muscle spasms. (Patient not taking: Reported on 08/18/2023)   [DISCONTINUED] fluticasone -salmeterol (ADVAIR HFA) 115-21 MCG/ACT inhaler Inhale 2 puffs into the lungs 2 (two) times daily.   [DISCONTINUED] guaiFENesin  (ROBITUSSIN) 100 MG/5ML liquid Take 10 mLs by mouth every 6 (six) hours.   [DISCONTINUED] Magnesium  200 MG TABS Take 1 tablet (200 mg total) by mouth 2 (two) times daily.   [DISCONTINUED] polyethylene glycol (MIRALAX  / GLYCOLAX ) 17 g packet Take 17 g by mouth daily as needed for mild constipation. (Patient not taking: Reported on 08/18/2023)   No facility-administered encounter medications on file as of 11/18/2023.   Vitals:   11/18/23 1543  BP: 118/74  Pulse: 70  Temp: 98.5 F (36.9 C)  Height: 5' 2 (1.575 m)  Weight: 195 lb 3.2 oz (88.5 kg)  SpO2: 96%  TempSrc: Temporal  BMI (Calculated): 35.69     Physical Exam GEN: No acute distress. CV: Regular rate and rhythm, no murmurs. LUNGS: Clear to auscultation bilaterally,  normal respiratory effort. SKIN JOINTS: Warm and dry, no rash.    Data Reviewed: Imaging: CT chest 07/04/2023-enlargement and left effusion with loculation, atelectasis/consolidation in the left lower lobe CT chest 07/08/2023- Loculated left effusion, consolidation in the left lower lobe, chest tube in place Chest x-ray 08/18/2023- Improvement in left effusion with some residual consolidation  I have reviewed the images personally.   PFTs:  Labs:  Assessment & Plan MRSA pneumonia with empyema Pneumonia with chest tube placement in April has shown improvement. Occasional exertional dyspnea persists. Residual inflammation is expected, with potential scar tissue formation. Tenderness at the chest tube insertion site indicates muscle irritation, without redness or acute inflammation. - Order chest X-ray to assess current status of residual inflammation and potential scar tissue. - Communicate X-ray results via MyChart or phone call. - Advise follow-up only if X-ray shows unexpected findings.  Recommendations: Chest x-ray Follow-up as needed  Zohar Laing MD [facility] 11/18/2023, 4:14 PM  CC: Trinidad Hun, MD

## 2023-11-22 ENCOUNTER — Ambulatory Visit: Payer: Self-pay | Admitting: Pulmonary Disease

## 2023-11-26 DIAGNOSIS — F331 Major depressive disorder, recurrent, moderate: Secondary | ICD-10-CM | POA: Diagnosis not present

## 2023-12-07 DIAGNOSIS — E1169 Type 2 diabetes mellitus with other specified complication: Secondary | ICD-10-CM | POA: Diagnosis not present

## 2023-12-08 DIAGNOSIS — E1169 Type 2 diabetes mellitus with other specified complication: Secondary | ICD-10-CM | POA: Diagnosis not present

## 2023-12-08 DIAGNOSIS — Z6835 Body mass index (BMI) 35.0-35.9, adult: Secondary | ICD-10-CM | POA: Diagnosis not present

## 2023-12-17 ENCOUNTER — Ambulatory Visit
Admission: RE | Admit: 2023-12-17 | Discharge: 2023-12-17 | Disposition: A | Discharge status: Home / Self Care | Source: Ambulatory Visit | Attending: Family Medicine | Admitting: Family Medicine

## 2023-12-17 DIAGNOSIS — Z1231 Encounter for screening mammogram for malignant neoplasm of breast: Secondary | ICD-10-CM

## 2023-12-17 DIAGNOSIS — F331 Major depressive disorder, recurrent, moderate: Secondary | ICD-10-CM | POA: Diagnosis not present

## 2023-12-23 ENCOUNTER — Other Ambulatory Visit: Payer: Self-pay | Admitting: Family Medicine

## 2023-12-23 DIAGNOSIS — R928 Other abnormal and inconclusive findings on diagnostic imaging of breast: Secondary | ICD-10-CM

## 2024-01-01 ENCOUNTER — Other Ambulatory Visit

## 2024-01-01 ENCOUNTER — Ambulatory Visit
Admission: RE | Admit: 2024-01-01 | Discharge: 2024-01-01 | Disposition: A | Discharge status: Home / Self Care | Source: Ambulatory Visit | Attending: Family Medicine | Admitting: Family Medicine

## 2024-01-01 DIAGNOSIS — R928 Other abnormal and inconclusive findings on diagnostic imaging of breast: Secondary | ICD-10-CM | POA: Diagnosis not present

## 2024-01-05 DIAGNOSIS — F331 Major depressive disorder, recurrent, moderate: Secondary | ICD-10-CM | POA: Diagnosis not present

## 2024-01-06 DIAGNOSIS — E1169 Type 2 diabetes mellitus with other specified complication: Secondary | ICD-10-CM | POA: Diagnosis not present

## 2024-01-07 DIAGNOSIS — E1169 Type 2 diabetes mellitus with other specified complication: Secondary | ICD-10-CM | POA: Diagnosis not present

## 2024-01-07 DIAGNOSIS — Z6835 Body mass index (BMI) 35.0-35.9, adult: Secondary | ICD-10-CM | POA: Diagnosis not present

## 2024-01-19 DIAGNOSIS — I129 Hypertensive chronic kidney disease with stage 1 through stage 4 chronic kidney disease, or unspecified chronic kidney disease: Secondary | ICD-10-CM | POA: Diagnosis not present

## 2024-01-19 DIAGNOSIS — E1169 Type 2 diabetes mellitus with other specified complication: Secondary | ICD-10-CM | POA: Diagnosis not present

## 2024-01-26 DIAGNOSIS — F331 Major depressive disorder, recurrent, moderate: Secondary | ICD-10-CM | POA: Diagnosis not present

## 2024-01-27 DIAGNOSIS — M205X2 Other deformities of toe(s) (acquired), left foot: Secondary | ICD-10-CM | POA: Diagnosis not present

## 2024-01-27 DIAGNOSIS — F339 Major depressive disorder, recurrent, unspecified: Secondary | ICD-10-CM | POA: Diagnosis not present

## 2024-01-27 DIAGNOSIS — E785 Hyperlipidemia, unspecified: Secondary | ICD-10-CM | POA: Diagnosis not present

## 2024-01-27 DIAGNOSIS — M7752 Other enthesopathy of left foot: Secondary | ICD-10-CM | POA: Diagnosis not present

## 2024-01-27 DIAGNOSIS — Z7984 Long term (current) use of oral hypoglycemic drugs: Secondary | ICD-10-CM | POA: Diagnosis not present

## 2024-01-27 DIAGNOSIS — M21612 Bunion of left foot: Secondary | ICD-10-CM | POA: Diagnosis not present

## 2024-01-27 DIAGNOSIS — E1169 Type 2 diabetes mellitus with other specified complication: Secondary | ICD-10-CM | POA: Diagnosis not present

## 2024-01-27 DIAGNOSIS — I129 Hypertensive chronic kidney disease with stage 1 through stage 4 chronic kidney disease, or unspecified chronic kidney disease: Secondary | ICD-10-CM | POA: Diagnosis not present

## 2024-01-27 DIAGNOSIS — E1165 Type 2 diabetes mellitus with hyperglycemia: Secondary | ICD-10-CM | POA: Diagnosis not present

## 2024-01-27 DIAGNOSIS — Z6835 Body mass index (BMI) 35.0-35.9, adult: Secondary | ICD-10-CM | POA: Diagnosis not present

## 2024-02-06 DIAGNOSIS — E1169 Type 2 diabetes mellitus with other specified complication: Secondary | ICD-10-CM | POA: Diagnosis not present

## 2024-02-07 DIAGNOSIS — Z6835 Body mass index (BMI) 35.0-35.9, adult: Secondary | ICD-10-CM | POA: Diagnosis not present

## 2024-02-07 DIAGNOSIS — E1169 Type 2 diabetes mellitus with other specified complication: Secondary | ICD-10-CM | POA: Diagnosis not present

## 2024-02-12 DIAGNOSIS — F331 Major depressive disorder, recurrent, moderate: Secondary | ICD-10-CM | POA: Diagnosis not present

## 2024-03-03 DIAGNOSIS — R600 Localized edema: Secondary | ICD-10-CM | POA: Diagnosis not present

## 2024-03-03 DIAGNOSIS — M25571 Pain in right ankle and joints of right foot: Secondary | ICD-10-CM | POA: Diagnosis not present

## 2024-03-07 DIAGNOSIS — Z6835 Body mass index (BMI) 35.0-35.9, adult: Secondary | ICD-10-CM | POA: Diagnosis not present

## 2024-03-08 DIAGNOSIS — Z6835 Body mass index (BMI) 35.0-35.9, adult: Secondary | ICD-10-CM | POA: Diagnosis not present

## 2024-03-08 DIAGNOSIS — E1169 Type 2 diabetes mellitus with other specified complication: Secondary | ICD-10-CM | POA: Diagnosis not present

## 2024-03-09 DIAGNOSIS — R82994 Hypercalciuria: Secondary | ICD-10-CM | POA: Diagnosis not present

## 2024-03-09 DIAGNOSIS — F331 Major depressive disorder, recurrent, moderate: Secondary | ICD-10-CM | POA: Diagnosis not present

## 2024-03-09 DIAGNOSIS — N200 Calculus of kidney: Secondary | ICD-10-CM | POA: Diagnosis not present

## 2024-10-22 ENCOUNTER — Encounter (INDEPENDENT_AMBULATORY_CARE_PROVIDER_SITE_OTHER): Admitting: Ophthalmology
